# Patient Record
Sex: Male | Born: 1950
Health system: Southern US, Community
[De-identification: ages and names within clinical notes are randomized; demographics above are authoritative.]

## PROBLEM LIST (undated history)

## (undated) DIAGNOSIS — H269 Unspecified cataract: Secondary | ICD-10-CM

## (undated) DIAGNOSIS — F32A Depression, unspecified: Secondary | ICD-10-CM

## (undated) DIAGNOSIS — J449 Chronic obstructive pulmonary disease, unspecified: Secondary | ICD-10-CM

## (undated) DIAGNOSIS — E119 Type 2 diabetes mellitus without complications: Secondary | ICD-10-CM

## (undated) DIAGNOSIS — H35039 Hypertensive retinopathy, unspecified eye: Secondary | ICD-10-CM

## (undated) DIAGNOSIS — I639 Cerebral infarction, unspecified: Secondary | ICD-10-CM

## (undated) DIAGNOSIS — G4730 Sleep apnea, unspecified: Secondary | ICD-10-CM

## (undated) DIAGNOSIS — F329 Major depressive disorder, single episode, unspecified: Secondary | ICD-10-CM

## (undated) DIAGNOSIS — H3320 Serous retinal detachment, unspecified eye: Secondary | ICD-10-CM

## (undated) DIAGNOSIS — F419 Anxiety disorder, unspecified: Secondary | ICD-10-CM

## (undated) DIAGNOSIS — J189 Pneumonia, unspecified organism: Secondary | ICD-10-CM

## (undated) DIAGNOSIS — Z87442 Personal history of urinary calculi: Secondary | ICD-10-CM

## (undated) DIAGNOSIS — E785 Hyperlipidemia, unspecified: Secondary | ICD-10-CM

## (undated) DIAGNOSIS — M1990 Unspecified osteoarthritis, unspecified site: Secondary | ICD-10-CM

## (undated) DIAGNOSIS — I10 Essential (primary) hypertension: Secondary | ICD-10-CM

## (undated) DIAGNOSIS — R197 Diarrhea, unspecified: Secondary | ICD-10-CM

## (undated) HISTORY — PX: CATARACT EXTRACTION: SUR2

## (undated) HISTORY — PX: EYE SURGERY: SHX253

## (undated) HISTORY — PX: OTHER SURGICAL HISTORY: SHX169

## (undated) HISTORY — DX: Hyperlipidemia, unspecified: E78.5

## (undated) HISTORY — DX: Major depressive disorder, single episode, unspecified: F32.9

## (undated) HISTORY — DX: Serous retinal detachment, unspecified eye: H33.20

## (undated) HISTORY — DX: Essential (primary) hypertension: I10

## (undated) HISTORY — DX: Hypertensive retinopathy, unspecified eye: H35.039

## (undated) HISTORY — DX: Chronic obstructive pulmonary disease, unspecified: J44.9

## (undated) HISTORY — DX: Unspecified cataract: H26.9

## (undated) HISTORY — DX: Cerebral infarction, unspecified: I63.9

## (undated) HISTORY — DX: Sleep apnea, unspecified: G47.30

## (undated) HISTORY — DX: Depression, unspecified: F32.A

## (undated) HISTORY — PX: CIRCUMCISION: SUR203

## (undated) HISTORY — DX: Type 2 diabetes mellitus without complications: E11.9

---

## 2000-12-17 ENCOUNTER — Emergency Department (HOSPITAL_COMMUNITY): Admission: EM | Admit: 2000-12-17 | Discharge: 2000-12-17 | Payer: Self-pay

## 2005-04-25 ENCOUNTER — Emergency Department (HOSPITAL_COMMUNITY): Admission: EM | Admit: 2005-04-25 | Discharge: 2005-04-25 | Payer: Self-pay | Admitting: Family Medicine

## 2006-06-15 ENCOUNTER — Emergency Department (HOSPITAL_COMMUNITY): Admission: EM | Admit: 2006-06-15 | Discharge: 2006-06-15 | Payer: Self-pay | Admitting: Family Medicine

## 2010-08-17 ENCOUNTER — Ambulatory Visit (INDEPENDENT_AMBULATORY_CARE_PROVIDER_SITE_OTHER): Payer: Self-pay | Admitting: Family Medicine

## 2010-08-17 ENCOUNTER — Ambulatory Visit (HOSPITAL_COMMUNITY)
Admission: RE | Admit: 2010-08-17 | Discharge: 2010-08-17 | Disposition: A | Discharge status: Home / Self Care | Payer: Self-pay | Source: Ambulatory Visit | Attending: Family Medicine | Admitting: Family Medicine

## 2010-08-17 ENCOUNTER — Encounter: Payer: Self-pay | Admitting: Family Medicine

## 2010-08-17 VITALS — BP 159/96 | HR 49 | Temp 97.7°F | Ht 66.25 in | Wt 188.4 lb

## 2010-08-17 DIAGNOSIS — I10 Essential (primary) hypertension: Secondary | ICD-10-CM | POA: Insufficient documentation

## 2010-08-17 DIAGNOSIS — I498 Other specified cardiac arrhythmias: Secondary | ICD-10-CM | POA: Insufficient documentation

## 2010-08-17 DIAGNOSIS — F17200 Nicotine dependence, unspecified, uncomplicated: Secondary | ICD-10-CM | POA: Insufficient documentation

## 2010-08-17 DIAGNOSIS — Z720 Tobacco use: Secondary | ICD-10-CM

## 2010-08-17 DIAGNOSIS — R001 Bradycardia, unspecified: Secondary | ICD-10-CM

## 2010-08-17 MED ORDER — HYDROCHLOROTHIAZIDE 25 MG PO TABS: 25.0000 mg | ORAL_TABLET | Freq: Every day | ORAL | Status: DC

## 2010-08-17 MED ORDER — TRIAMTERENE-HCTZ 37.5-25 MG PO TABS: 1.0000 | ORAL_TABLET | Freq: Every day | ORAL | Status: DC

## 2010-08-17 NOTE — Progress Notes (Signed)
  Subjective:    Patient ID: Jonathan [name], male    DOB: 1950-07-20, 60 y.o.   MRN: [id number]  HPI Pt her for new visit. Currenty uninsured. In process of getting [facility]. Past history significant for hypertension and smoking.   HTN- Not checking BPs at home. Previously dxd 3-4 years ago per pt. Was on medications previously (unsure of which). No HA, CP, SOB, DOE, orthopnea per pt. Not checking BPs on a regular basis. High intake of canned foods and salted pork. Does not add table salt to foods.   Smoking- 20-30 pack year smoking hx per pt. Currently at 1ppd. Does desire to quit.   Review of Systems See HPI     Objective:   Physical Exam Gen: up in chair, NAD HEENT: NCAT, EOMI, TMs clear bilaterally CV: Bradycardic, no murmurs PULM: CTAB, no wheezes, rales, rhoncii ABD: S/NT/+ bowel sounds  EXT: 2+ peripheral pulses    Assessment & Plan:  HTN- Will start on maxzide as this is K neutral. Will hold on labs until [facility] is set up. HTN handout given to pt. Also discussed low sodium diet. Will follow up in 1-2 weeks pending [facility] set up. Pt agreeable to plan.   Smoking- Pt ready to quit. Will discuss smoking cessation options at follow up visit pending [facility] set up. Pt agreeable to plan.   Bradycardia- Of unclear etiology. Currently not on any medications.  Pt reports remote hx marathon running in the past. EKG w/ sinus bradycardia. Pt currently asymptomatic. Will consider further work up including TSH at follow up.

## 2010-08-17 NOTE — Patient Instructions (Signed)
I am starting you on HCTZ for your blood pressures Please follow up with me in 1-2 weeks once your [facility] is set up  Otherwise, call with any other questions or concerns God Bless, Doree Albee MD  Hypertension (High Blood Pressure) As your heart beats, it forces blood through your arteries. This force is your blood pressure. If the pressure is too high, it is called hypertension (HTN) or high blood pressure. HTN is dangerous because you may have it and not know it. High blood pressure may mean that your heart has to work harder to pump blood. Your arteries may be narrow or stiff. The extra work puts you at risk for heart disease, stroke, and other problems.  Blood pressure consists of two numbers, a higher number over a lower, 110/72, for example. It is stated as "110 over 72." The ideal is below 120 for the top number (systolic) and under 80 for the bottom (diastolic). Write down your blood pressure today. You should pay close attention to your blood pressure if you have certain conditions such as:  Heart failure.  Prior heart attack.   Diabetes   Chronic kidney disease.   Prior stroke.   Multiple risk factors for heart disease.   To see if you have HTN, your blood pressure should be measured while you are seated with your arm held at the level of the heart. It should be measured at least twice. A one-time elevated blood pressure reading (especially in the Emergency Department) does not mean that you need treatment. There may be conditions in which the blood pressure is different between your right and left arms. It is important to see your caregiver soon for a recheck. Most people have essential hypertension which means that there is not a specific cause. This type of high blood pressure may be lowered by changing lifestyle factors such as:  Stress.  Smoking.   Lack of exercise.   Excessive weight.  Drug/tobacco/alcohol use.   Eating less salt.   Most people do not have  symptoms from high blood pressure until it has caused damage to the body. Effective treatment can often prevent, delay or reduce that damage. TREATMENT Treatment for high blood pressure, when a cause has been identified, is directed at the cause. There are a large number of medications to treat HTN. These fall into several categories, and your caregiver will help you select the medicines that are best for you. Medications may have side effects. You should review side effects with your caregiver. If your blood pressure stays high after you have made lifestyle changes or started on medicines,   Your medication(s) may need to be changed.   Other problems may need to be addressed.   Be certain you understand your prescriptions, and know how and when to take your medicine.   Be sure to follow up with your caregiver within the time frame advised (usually within two weeks) to have your blood pressure rechecked and to review your medications.   If you are taking more than one medicine to lower your blood pressure, make sure you know how and at what times they should be taken. Taking two medicines at the same time can result in blood pressure that is too low.  SEEK IMMEDIATE MEDICAL CARE IF YOU DEVELOP:  A severe headache, blurred or changing vision, or confusion.   Unusual weakness or numbness, or a faint feeling.   Severe chest or abdominal pain, vomiting, or breathing problems.  MAKE  SURE YOU:   Understand these instructions.   Will watch your condition.   Will get help right away if you are not doing well or get worse.  Document Released: 04/11/2005 Document Re-Released: 09/29/2009 [facility] Patient Information 2011 [place], [place].

## 2010-08-17 NOTE — Assessment & Plan Note (Signed)
Of unclear etiology. Currently not on any medications. Pt reports remote hx marathon running in the past. EKG w/ sinus bradycardia. Pt currently asymptomatic. Will consider further work up including TSH at follow up.

## 2010-08-17 NOTE — Assessment & Plan Note (Signed)
Will start on maxzide as this is K neutral. Will hold on labs until [facility] is set up. HTN handout given to pt. Also discussed low sodium diet. Will follow up in 1-2 weeks pending [facility] set up. Pt agreeable to plan

## 2010-08-17 NOTE — Assessment & Plan Note (Signed)
Pt ready to quit. Will discuss smoking cessation options at follow up visit pending [facility] set up. Pt agreeable to plan.

## 2010-09-08 ENCOUNTER — Ambulatory Visit: Payer: Self-pay | Admitting: Family Medicine

## 2010-10-04 ENCOUNTER — Ambulatory Visit: Payer: Self-pay | Admitting: Family Medicine

## 2010-10-06 ENCOUNTER — Telehealth: Payer: Self-pay | Admitting: Family Medicine

## 2010-10-06 NOTE — Telephone Encounter (Signed)
Has moved appointment to sooner because his BP was high, they will be coming in on Friday but until then they want to know what to do to get his BP down.

## 2010-10-07 ENCOUNTER — Ambulatory Visit (INDEPENDENT_AMBULATORY_CARE_PROVIDER_SITE_OTHER): Payer: Self-pay | Admitting: *Deleted

## 2010-10-07 ENCOUNTER — Telehealth: Payer: Self-pay | Admitting: Family Medicine

## 2010-10-07 VITALS — BP 180/100 | HR 64

## 2010-10-07 DIAGNOSIS — I10 Essential (primary) hypertension: Secondary | ICD-10-CM

## 2010-10-07 MED ORDER — DOXAZOSIN MESYLATE 2 MG PO TABS: 2.0000 mg | ORAL_TABLET | Freq: Every day | ORAL | Status: DC

## 2010-10-07 NOTE — Telephone Encounter (Signed)
Pt came in for nurse visit today and BP was still very high.  I added Cardura 2 mg qhs to his maxzide daily.  Will still need to come in for office visit and labs tomorrow.

## 2010-10-07 NOTE — Progress Notes (Signed)
In for BP check. States he checked BP at [facility] yesterday with reading of 180/143.  BP today in office checked manually using regular adult cuff. BP   RA 160/96 and LA 180/100 pulse 64. Denies any chest pain , SOB ,  vision changes.  Consulted with Dr. Cathey Endow preceptor and she will send in RX for Cardura. Continue Maxizide. Has appointment tomorrow AM with Luretha Murphy.

## 2010-10-07 NOTE — Telephone Encounter (Signed)
Called pt. Spoke with pt's wife Aggie Cosier. She reports, that pt's BP was taken at [facility] yesterday.  BP 180/143. Pt takes his HTN meds daily. He has upcoming appt with S.Saxon tomorrow at 8:45am. Pt is doing fine per wife. No symptoms of dizziness, blurred vision, pain... Advised to schedule nurse visit today to have BP checked and also to keep appt tomorrow. Aggie Cosier  Agreed. Lorenda Hatchet, Renato Battles

## 2010-10-08 ENCOUNTER — Ambulatory Visit (INDEPENDENT_AMBULATORY_CARE_PROVIDER_SITE_OTHER): Payer: Self-pay | Admitting: Family Medicine

## 2010-10-08 ENCOUNTER — Encounter: Payer: Self-pay | Admitting: Family Medicine

## 2010-10-08 DIAGNOSIS — Z720 Tobacco use: Secondary | ICD-10-CM

## 2010-10-08 DIAGNOSIS — I10 Essential (primary) hypertension: Secondary | ICD-10-CM

## 2010-10-08 DIAGNOSIS — F17200 Nicotine dependence, unspecified, uncomplicated: Secondary | ICD-10-CM

## 2010-10-08 MED ORDER — LISINOPRIL-HYDROCHLOROTHIAZIDE 10-12.5 MG PO TABS: 1.0000 | ORAL_TABLET | Freq: Every day | ORAL | Status: DC

## 2010-10-08 NOTE — Assessment & Plan Note (Signed)
2. Tobacco abuse: current smoker, 1ppd, interested in quitting.  A. Instructed pt to decrease down to 1/2ppd, and maintain this for about 1 month.  At that time, choose a quit date, then notify provider.  Once this is accomplished, we will coordinate pharmacist to assist with smoking cessation.  Plans agreed upon by patient, pt's wife, and medical team.

## 2010-10-08 NOTE — Patient Instructions (Signed)
Finish your triamterene/HCTZ and take doxasosin this should be 7-10 Then begin the new medication lisinopril/HCTZ then You must come to see Dr. Alvester Morin in 2 weeks after starting lisinopril for blood work (do not eat).   Decrease smoking to 1/2 pack per day for about 1 month.  Then, set a quit date, and let us know what that day is.  Then our pharmacist will help you establish a plan to quit.

## 2010-10-08 NOTE — Progress Notes (Signed)
  Subjective:    Patient ID: Jonathan [name], male    DOB: 1950/07/01, 60 y.o.   MRN: [id number]  HPI 60 year old man returns to clinic for follow-up of HTN after being started on doxazosin on 10/07/10.  Before starting doxazosin, his HTN was being treated with triamterene-HCTZ 37.5-25mg  po daily.  Today, after one dose of doxazosin, pt presents with BP 132/82.  Denies headache, cp/pressure/heaviness, palpitations, dyspnea at rest or with exertion (good exercise tolerance), diaphoresis, n/v, extremity swelling, claudication, headaches, vision changes/diplopia/blurry vision, facial flushing, difficulty urinating.   Psych/Soc: Pt is a current smoker, 1ppd, 45 pack years.  Interested in smoking cessation.  Wife has quit smoking (with chantix).  He also has a previous exposure to asbestos X1 week (did not get CXR recommended by employer). He exercises regularly.  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  Denies ilicit/street drug use, or herbals.    FH: Father-MI ( at 62yrs old), died 2/2 cirrhosis; Mother- died 2/2 brain tumor (103yrs); older brother died at 33 from a massive MI; sister- HTN; other brother- unknown medical history, but alive; MGM-asthma; son-asthma; oldest son-emphysema 2/2 smoking cigarettes.      Review of Systems  Constitutional: Negative for diaphoresis, activity change, fatigue and unexpected weight change.  HENT: Negative for nosebleeds, congestion, trouble swallowing and voice change.   Eyes: Negative for visual disturbance.  Respiratory: Positive for cough. Negative for apnea, chest tightness, shortness of breath and wheezing.        Occasional cough, productive of white/clear sputum  Cardiovascular: Negative for chest pain, palpitations and leg swelling.  Gastrointestinal: Negative for nausea, vomiting, abdominal pain, diarrhea, constipation, blood in stool and anal bleeding.  Genitourinary: Negative for decreased urine volume and difficulty urinating.    Musculoskeletal: Negative for myalgias.  Skin: Positive for wound. Negative for color change.       Left forearm significant for healing burn  Neurological: Negative for dizziness, syncope, facial asymmetry, speech difficulty, weakness, light-headedness, numbness and headaches.  Hematological: Negative for adenopathy.  Psychiatric/Behavioral: Negative for confusion.       Objective:   Physical Exam  Constitutional: He is oriented to person, place, and time. He appears well-developed and well-nourished. No distress.  HENT:  Head: Normocephalic and atraumatic.  Nose: Nose normal.  Mouth/Throat: Oropharynx is clear and moist. No oropharyngeal exudate.  Eyes: Conjunctivae and EOM are normal. Pupils are equal, round, and reactive to light. Right eye exhibits no discharge. Left eye exhibits no discharge. No scleral icterus.  Neck: Neck supple. No JVD present. No thyromegaly present.  Cardiovascular: Normal rate, regular rhythm, normal heart sounds and intact distal pulses.  Exam reveals no gallop and no friction rub.   No murmur heard. Pulmonary/Chest: Effort normal and breath sounds normal. He exhibits no tenderness.  Abdominal: Soft. Bowel sounds are normal. He exhibits no mass. There is no tenderness.  Lymphadenopathy:    He has no cervical adenopathy.  Neurological: He is alert and oriented to person, place, and time.  Skin: Skin is warm and dry. He is not diaphoretic.       Left forearm with healing wound 2/2 burn.  Pt denies discharge.  Psychiatric: He has a normal mood and affect. His behavior is normal.          Assessment & Plan:

## 2010-10-08 NOTE — Assessment & Plan Note (Addendum)
1. Hypertension: BP improved today (132/82).  Re-addressing medication regimen.  A. Prescribed lisinopril-HCTZ 10mg -12.5mg  1 tablet po daily Disp #30, 11 refills.   B. Pt was instructed to continue taking both the doxazosin and triamterene-HCTZ until he finishes his current prescription of triamterene-HCTZ.  Once he has no more of the triamterene-HCTZ, he was instructed to stop taking the doxazosin, and begin taking the new medication: lisinopril-HCTZ 10mg -12.5mg  1 tablet po daily.  C. Instructed pt to make follow-up appt to see Dr. Alvester Morin (PCP) in two weeks.   2. Tobacco abuse: current smoker, 1ppd, interested in quitting.  A. Instructed pt to decrease down to 1/2ppd, and maintain this for about 1 month.  At that time, choose a quit date, then notify provider.  Once this is accomplished, we will coordinate pharmacist to assist with smoking cessation.  Plans agreed upon by patient, pt's wife, and medical team.

## 2010-10-29 ENCOUNTER — Ambulatory Visit: Payer: Self-pay | Admitting: Family Medicine

## 2010-11-03 ENCOUNTER — Encounter: Payer: Self-pay | Admitting: Family Medicine

## 2010-11-03 ENCOUNTER — Ambulatory Visit (INDEPENDENT_AMBULATORY_CARE_PROVIDER_SITE_OTHER): Payer: Self-pay | Admitting: Family Medicine

## 2010-11-03 DIAGNOSIS — F17200 Nicotine dependence, unspecified, uncomplicated: Secondary | ICD-10-CM

## 2010-11-03 DIAGNOSIS — I10 Essential (primary) hypertension: Secondary | ICD-10-CM

## 2010-11-03 DIAGNOSIS — K4090 Unilateral inguinal hernia, without obstruction or gangrene, not specified as recurrent: Secondary | ICD-10-CM

## 2010-11-03 DIAGNOSIS — Z23 Encounter for immunization: Secondary | ICD-10-CM

## 2010-11-03 LAB — COMPREHENSIVE METABOLIC PANEL
ALT: 35 U/L (ref 0–53)
Albumin: 4.5 g/dL (ref 3.5–5.2)
BUN: 19 mg/dL (ref 6–23)
CO2: 22 mEq/L (ref 19–32)
Calcium: 9.4 mg/dL (ref 8.4–10.5)
Chloride: 105 mEq/L (ref 96–112)
Creat: 1.17 mg/dL (ref 0.50–1.35)
Potassium: 3.9 mEq/L (ref 3.5–5.3)

## 2010-11-03 MED ORDER — VARENICLINE TARTRATE 0.5 MG PO TABS: 0.5000 mg | ORAL_TABLET | Freq: Two times a day (BID) | ORAL | Status: AC

## 2010-11-03 MED ORDER — LISINOPRIL-HYDROCHLOROTHIAZIDE 10-12.5 MG PO TABS: 2.0000 | ORAL_TABLET | Freq: Every day | ORAL | Status: DC

## 2010-11-03 NOTE — Progress Notes (Signed)
  Subjective:    Patient ID: Jonathan [name], male    DOB: 1950/12/30, 60 y.o.   MRN: [id number]  HPI HTN- Checks BP ar [facility]. Was 180/143. Currently taking zestoretic. Walks in neighborhood intermittently. No HA, CP, SOB per pt. Is trying to cut down on sodium intake.  Hernia: Present for approx 1 year. R sided. In groin area. Sometimes painful. Does not affect bowel mov'ts. Has been progressively getting bigger. Non tender. Is desiring possible surgical referral for this.   Review of Systems See HPI     Objective:   Physical Exam Gen: up in chair, NAD HEENT: NCAT, EOMI, TMs clear bilaterally CV: RRR, no murmurs auscultated PULM: CTAB, no wheezes, rales, rhoncii ABD: S/NT/+ bowel sounds  Groin: Palpable R inguinal mass with cough/bearing down. EXT: 2+ peripheral pulses Assessment & Plan:  HTN: Will increase lisinopril/HCtZ combo today.  Hernia: will refer to surgery for further management.

## 2010-11-03 NOTE — Patient Instructions (Addendum)
It was good to see you today I am increasing your blood pressure medicine I am getting some blood work.  If you have any chest pain, shortness of breath, or any other concerning symptoms, please give [place] a call.  I am also starting you on chantix for your smoking If you notice any suicidal thoughts, please give [place] a call God Bless, Doree Albee MD

## 2010-11-07 DIAGNOSIS — K4090 Unilateral inguinal hernia, without obstruction or gangrene, not specified as recurrent: Secondary | ICD-10-CM | POA: Insufficient documentation

## 2010-11-07 NOTE — Assessment & Plan Note (Addendum)
Will increase lisinopril/HCTZ combo. Will also check Cr and K.

## 2010-11-07 NOTE — Assessment & Plan Note (Signed)
Will refer to surgery for further management

## 2010-11-18 ENCOUNTER — Encounter: Payer: Self-pay | Admitting: Family Medicine

## 2010-11-18 ENCOUNTER — Ambulatory Visit (INDEPENDENT_AMBULATORY_CARE_PROVIDER_SITE_OTHER): Payer: Self-pay | Admitting: Family Medicine

## 2010-11-18 ENCOUNTER — Ambulatory Visit (HOSPITAL_COMMUNITY)
Admission: RE | Admit: 2010-11-18 | Discharge: 2010-11-18 | Disposition: A | Discharge status: Home / Self Care | Payer: Self-pay | Source: Ambulatory Visit | Attending: Family Medicine | Admitting: Family Medicine

## 2010-11-18 VITALS — BP 133/84 | HR 73 | Temp 98.0°F | Ht 66.0 in | Wt 189.0 lb

## 2010-11-18 DIAGNOSIS — M25561 Pain in right knee: Secondary | ICD-10-CM

## 2010-11-18 DIAGNOSIS — F17200 Nicotine dependence, unspecified, uncomplicated: Secondary | ICD-10-CM | POA: Insufficient documentation

## 2010-11-18 DIAGNOSIS — R059 Cough, unspecified: Secondary | ICD-10-CM | POA: Insufficient documentation

## 2010-11-18 DIAGNOSIS — M25562 Pain in left knee: Secondary | ICD-10-CM

## 2010-11-18 DIAGNOSIS — Z77090 Contact with and (suspected) exposure to asbestos: Secondary | ICD-10-CM

## 2010-11-18 DIAGNOSIS — I10 Essential (primary) hypertension: Secondary | ICD-10-CM

## 2010-11-18 DIAGNOSIS — R05 Cough: Secondary | ICD-10-CM | POA: Insufficient documentation

## 2010-11-18 DIAGNOSIS — M25569 Pain in unspecified knee: Secondary | ICD-10-CM | POA: Insufficient documentation

## 2010-11-18 DIAGNOSIS — K4090 Unilateral inguinal hernia, without obstruction or gangrene, not specified as recurrent: Secondary | ICD-10-CM

## 2010-11-18 MED ORDER — AMLODIPINE BESYLATE 5 MG PO TABS: 5.0000 mg | ORAL_TABLET | Freq: Every day | ORAL | Status: DC

## 2010-11-18 NOTE — Assessment & Plan Note (Signed)
Likely progression of baseline arthritic disease. No focal findings noted on MSK exam Will send for xrays to assess for any articular damage. Instructed pt to use tylenol for pain prn.

## 2010-11-18 NOTE — Progress Notes (Signed)
  Subjective:    Patient ID: ELEFTHERIOS DUDENHOEFFER, male    DOB: Jul 20, 1950, 60 y.o.   MRN: [id number]  HPI HTN: 130s/80s at home. No HA, CP, SOB. Still walking. Cutting back on soda. Drinking plenty of water. Cutting back on quite a few salty items.    Knee Pain: pt reports longstanding hx/o knee pain. 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 All of these activities required vigorous activities on the knees per pt. Pt states over the past 2-3 months, he has noticed worsening knee pain (R>L) that is worse with kneeling and bending of R leg. Pt states that we will lay on bed/couch with knee in flexed position and note sever stiffness and popping in moving from this position. No erythema, radicular pain, swelling per pt. Pain usually resolves after 20 -30 minutes of ambulation. Pt denies any recent direct trauma to the knee. Review of Systems See HPI, otherwise 12 point ROS negative.     Objective:   Physical Exam Gen: up in chair, NAD CV: RRR, no murmurs auscultated PULM: CTAB, no wheezes, rales, rhoncii ABD: S/NT/+ bowel sounds  Knee: Knee exam shows no effusion; stable ligaments; negative Mcmurray's and provocative meniscal tests; crepitation on patellar compression; patellar and quadriceps tendons unremarkable. EXT: 2+ peripheral pulses Assessment & Plan:

## 2010-11-18 NOTE — Assessment & Plan Note (Signed)
Pending surgical follow up.

## 2010-11-18 NOTE — Patient Instructions (Signed)
It was good to see you again today Your blood pressure is doing better.  I am starting you on norvasc to help with your blood pressures Continue exercising and eating a low salt diet.  For your knee pain, try using tylenol as needed.  Come back to see me in 2-3 months for follow up on your knee pain.  Call with any questions, God Bless,  Doree Albee MD

## 2010-11-18 NOTE — Assessment & Plan Note (Signed)
Will check follow up CXR.

## 2010-11-18 NOTE — Assessment & Plan Note (Signed)
Overall stable on current regimen. Will add on norvasc to help keep BP in target range. Will follow up in 2 months for follow up. Will check Cr and K at that time.

## 2010-11-23 ENCOUNTER — Encounter: Payer: Self-pay | Admitting: Family Medicine

## 2010-11-29 ENCOUNTER — Encounter: Payer: Self-pay | Admitting: Family Medicine

## 2010-12-07 ENCOUNTER — Ambulatory Visit (INDEPENDENT_AMBULATORY_CARE_PROVIDER_SITE_OTHER): Payer: Self-pay | Admitting: Surgery

## 2011-01-13 ENCOUNTER — Ambulatory Visit: Payer: Self-pay | Admitting: Family Medicine

## 2011-01-17 ENCOUNTER — Ambulatory Visit (INDEPENDENT_AMBULATORY_CARE_PROVIDER_SITE_OTHER): Payer: Self-pay | Admitting: Family Medicine

## 2011-01-17 ENCOUNTER — Encounter: Payer: Self-pay | Admitting: Family Medicine

## 2011-01-17 VITALS — BP 154/82 | HR 50 | Wt 193.3 lb

## 2011-01-17 DIAGNOSIS — H9190 Unspecified hearing loss, unspecified ear: Secondary | ICD-10-CM | POA: Insufficient documentation

## 2011-01-17 DIAGNOSIS — Z720 Tobacco use: Secondary | ICD-10-CM

## 2011-01-17 DIAGNOSIS — Z23 Encounter for immunization: Secondary | ICD-10-CM

## 2011-01-17 DIAGNOSIS — F17200 Nicotine dependence, unspecified, uncomplicated: Secondary | ICD-10-CM

## 2011-01-17 DIAGNOSIS — I10 Essential (primary) hypertension: Secondary | ICD-10-CM

## 2011-01-17 LAB — BASIC METABOLIC PANEL
CO2: 26 mEq/L (ref 19–32)
Chloride: 102 mEq/L (ref 96–112)
Creat: 1.12 mg/dL (ref 0.50–1.35)
Potassium: 4.8 mEq/L (ref 3.5–5.3)
Sodium: 138 mEq/L (ref 135–145)

## 2011-01-17 NOTE — Assessment & Plan Note (Signed)
Overall mixed picture on exam today. Hearing loss maybe combination of both sensorineural and conductive hearing loss. Refer to ENT for further assessment.

## 2011-01-17 NOTE — Assessment & Plan Note (Addendum)
Patient instructed to resume previous regimen of Zestoretic 20/25. Will continue with Norvasc 5. We'll check creatinine and potassium today. Discussed importance of low sodium intake. We'll follow up in one to 2 months.

## 2011-01-17 NOTE — Patient Instructions (Signed)
It was good to see today.  I am referring you to the ear nose and throat doctor for your hearing loss Be sure to take 2 tablets of you combine lisinopril and HCTZ tablet Cut down on your salty food intake Come back to see me in one to 2 months for followup on your blood pressure Call with any questions God Bless,  Doree Albee MD

## 2011-01-17 NOTE — Assessment & Plan Note (Signed)
Encouraged decreased smoking today. Patient feels he is making significant progress. Encouraged compliance with Chantix. Will followup in one month.

## 2011-01-17 NOTE — Progress Notes (Signed)
  Subjective:    Patient ID: Jonathan [name], male    DOB: 01-Aug-1950, 60 y.o.   MRN: [id number]  HPI Patient is here for current problem followup: Hypertension: The patient has been checking blood pressures on an intermittent basis. The patient usually checks his blood pressures at either the grocery store or the pharmacy. Per patient pressures have been ranging in the 130s to 160s at home. No headache chest pain or shortness of breath per patient. And most recent visit patient started on Norvasc. The patient was previously on Zestoretic 20/25. However further discussion with the patient, he was only taking Zestoretic 10/12.5. Patient also reports a relatively high salt intake including daily processed meats, including salted pork. No orthopnea or lower extremity swelling. Hearing loss: The patient was a long-standing history of hearing loss there is seemed to be worsened over the last 2-3 months. Patient reports that hes  had a history of hearing loss since his days in the [employer]. Patient also reports a baseline history of perforated eardrums bilaterally that he was seen for this approximately 4-5 years ago. Patient denies any recent trauma. However patient does report active work in [employer]. Hearing loss seems to be most prominent  on the left versus right. No vertiginous symptoms or gait instability. No recent infections. Tobacco Abuse: Patient with a long-standing history of tobacco abuse. Patient previously a greater than one pack per day smoker. Per patient he is now down to smoking three quarters of a pack daily. Has been using Chantix intermittently.   Review of Systems See HPI     Objective:   Physical Exam Gen: up in chair, NAD HEENT: NCAT, EOMI, TMs clear bilaterally, no noted eardrum perforations.  CV: RRR, no murmurs auscultated PULM: CTAB, no wheezes, rales, rhoncii ABD: S/NT/+ bowel sounds  EXT: 2+ peripheral pulses Neuro: Rinne/Weber: + lateralization oh conductive hearing with  Weber to L side; AC>BC bilaterally  Assessment & Plan:

## 2011-01-24 ENCOUNTER — Telehealth: Payer: Self-pay | Admitting: *Deleted

## 2011-01-24 NOTE — Telephone Encounter (Signed)
appt (through [employer]) Audiologist 02-08-11 arrive at 8:45 am. Ph [phone]. [address] Pt informed of appt .Arlyss Repress

## 2011-02-01 ENCOUNTER — Telehealth: Payer: Self-pay | Admitting: Family Medicine

## 2011-02-01 ENCOUNTER — Other Ambulatory Visit: Payer: Self-pay | Admitting: Family Medicine

## 2011-02-01 DIAGNOSIS — I10 Essential (primary) hypertension: Secondary | ICD-10-CM

## 2011-02-01 MED ORDER — AMLODIPINE BESYLATE 5 MG PO TABS: 10.0000 mg | ORAL_TABLET | Freq: Every day | ORAL | Status: DC

## 2011-02-01 NOTE — Telephone Encounter (Signed)
Will fwd. To Dr.Newton for advise. .Jonathan [name]  

## 2011-02-01 NOTE — Telephone Encounter (Signed)
Pt has been taking 2 Amlodipine (he says that's what Dr Alvester Morin told him to do) and he is out early.  Wants to know what he should do.

## 2011-02-03 NOTE — Telephone Encounter (Signed)
Okey Regal Scientist, research (physical sciences)) at [facility] [employer] [phone] calling to verify Mr. Schoenfeld Rx for generic Norvasc 5mg .  Prescription written to take two tablets daily but was only written for #30.  Advised pharmacy to increase to #60.  Ileana Ladd

## 2011-02-04 ENCOUNTER — Ambulatory Visit (INDEPENDENT_AMBULATORY_CARE_PROVIDER_SITE_OTHER): Payer: Self-pay | Admitting: Family Medicine

## 2011-02-04 ENCOUNTER — Encounter: Payer: Self-pay | Admitting: Family Medicine

## 2011-02-04 VITALS — BP 145/82 | HR 79 | Temp 98.3°F | Ht 66.0 in | Wt 196.3 lb

## 2011-02-04 DIAGNOSIS — I10 Essential (primary) hypertension: Secondary | ICD-10-CM

## 2011-02-04 MED ORDER — LISINOPRIL-HYDROCHLOROTHIAZIDE 20-12.5 MG PO TABS: 2.0000 | ORAL_TABLET | Freq: Every day | ORAL | Status: DC

## 2011-02-04 MED ORDER — ASPIRIN EC 81 MG PO TBEC: 81.0000 mg | DELAYED_RELEASE_TABLET | Freq: Every day | ORAL | Status: DC

## 2011-02-04 NOTE — Assessment & Plan Note (Addendum)
Will double ACE inhibitor component of Zestoretic. She has a followup appointment with me in approximately 2 weeks. Will follow up  blood pressure at that point. No indication for labs right now.

## 2011-02-04 NOTE — Progress Notes (Signed)
  Subjective:    Patient ID: Jonathan [name], male    DOB: 11-28-1950, 60 y.o.   MRN: [id number]  HPI Patient being seen today for work in visit for hypertension. Patient states he's been checking his blood pressures at home and they have consistently been in the 160s/90s. No chest pain, shortness of breath, or  headache. Patient is compliant with his medications including Norvasc 10 mg as well as Zestoretic 10/12.5 two tabs daily. Patient states he decrease his salt intake since last clinical visit. Patient still smoking.    Review of Systems See HPI     Objective:   Physical Exam Gen: up in chair, NAD HEENT: NCAT, EOMI, TMs clear bilaterally CV: RRR, no murmurs auscultated PULM: CTAB, no wheezes, rales, rhoncii ABD: S/NT/+ bowel sounds  EXT: 2+ peripheral pulses   Assessment & Plan:

## 2011-02-04 NOTE — Patient Instructions (Signed)

## 2011-02-08 ENCOUNTER — Telehealth: Payer: Self-pay | Admitting: Family Medicine

## 2011-02-08 NOTE — Telephone Encounter (Signed)
States that they cannot afford the Amlodipine and needs something cheaper [facility] [address]

## 2011-02-08 NOTE — Telephone Encounter (Signed)
Called pt. Pt was not available. Will fwd. Request to Dr.Newton. Lorenda Hatchet, Renato Battles

## 2011-02-08 NOTE — Telephone Encounter (Signed)
Informed pt's wife that request has been sent to Dr. Alvester Morin.Loralee Pacas [place]

## 2011-02-14 ENCOUNTER — Ambulatory Visit: Payer: Self-pay | Admitting: Family Medicine

## 2011-02-16 ENCOUNTER — Telehealth: Payer: Self-pay | Admitting: Family Medicine

## 2011-02-16 NOTE — Telephone Encounter (Signed)
Send the Rx for Amlodipine to [facility] on [address].  She will pick it up around the 1st of the month when she gets paid.

## 2011-02-16 NOTE — Telephone Encounter (Signed)
To PCP for refill .Arlyss Repress

## 2011-02-16 NOTE — Telephone Encounter (Signed)
Called and left VM for pt that he can fill medication at [facility] for 10 dollars per month.

## 2011-02-18 NOTE — Telephone Encounter (Signed)
Please inform pt that he can have refill sent over to [facility] by requesting refill transfer.

## 2011-03-02 ENCOUNTER — Ambulatory Visit (INDEPENDENT_AMBULATORY_CARE_PROVIDER_SITE_OTHER): Payer: Self-pay | Admitting: Family Medicine

## 2011-03-02 ENCOUNTER — Encounter: Payer: Self-pay | Admitting: Family Medicine

## 2011-03-02 VITALS — BP 165/80 | HR 61 | Ht 66.0 in | Wt 198.0 lb

## 2011-03-02 DIAGNOSIS — M25511 Pain in right shoulder: Secondary | ICD-10-CM | POA: Insufficient documentation

## 2011-03-02 DIAGNOSIS — I10 Essential (primary) hypertension: Secondary | ICD-10-CM

## 2011-03-02 DIAGNOSIS — M25519 Pain in unspecified shoulder: Secondary | ICD-10-CM

## 2011-03-02 MED ORDER — AMLODIPINE BESYLATE 10 MG PO TABS: 10.0000 mg | ORAL_TABLET | Freq: Every day | ORAL | Status: DC

## 2011-03-02 NOTE — Progress Notes (Signed)
  Subjective:    Patient ID: Jonathan [name], male    DOB: May 04, 1950, 60 y.o.   MRN: [id number]  HPI Shoulder pain: Patient reports that he fell off of a dumpster approximately 2 weeks ago. Patient states he was taking out the trash and fell upon retrieval of the trash can. No direct trauma to shoulder. Pt states that he fell on back more than shoulder. Patient denies any shoulder instability since the fall. No secondary ecchymoses around the area. Shoulder area has been painful. Pain is worse with movement of the arm especially with abduction. No radicular symptoms numbness or sensory deficits. Patient has not been using any medicine for pain.  Hypertension: Norvasc was recently added to blood pressure regimen with pt . Patient states he has not received his medications secondary to cost. Patient was unaware the to obtain this prescription at [facility] on the discount plan. Patient states it hasn't taken this medication tomorrow. Patient is up to check his blood pressures on a regular basis. Patient denies any headache chest pain or shortness of breath. Patient states he has tried to decrease his salt intake since last clinical visit.   Review of Systems See history of present illness, otherwise 12 point review of systems negative    Objective:   Physical Exam Gen: in bed, NAD CV: RRR,  No murmurs PULM: CTAB, no wheezes, rales, rhoncii  MSK: R Shoulder: Inspection reveals no abnormalities, atrophy or asymmetry. Palpation is normal with no tenderness over AC joint or bicipital groove. ROM is decreased with R arm abduction Rotator cuff strength is mildly diminished. . No signs of impingement with negative Neer and Hawkin's tests, empty can. No labral pathology noted, overall good stability. Normal scapular function observed. No painful arc and no drop arm sign. No apprehension sign Grip strength 5/5 bilaterally    Assessment & Plan:

## 2011-03-02 NOTE — Assessment & Plan Note (Signed)
Relatively broad differential for this including rotator cuff pathology vs. Subacromial bursitis vs. Flare of OA. Joint is overall stable on exam which is reassuring. Will give therapuetic shoulder injection. Pt also instructed to take scheduled tylenol. Will follow clinically. If pain persists, will obtain xray to further evaluate.   R shoulder injection: Consent obtained and verified. Sterile betadine prep. Furthur cleansed with alcohol. Topical analgesic spray: Ethyl chloride. Joint: R shoulder  Approached in typical fashion with: 1 cc kenalog 40 and 4 ccs lidocaine 1/% with posterior approach Completed without difficulty Meds: 1 cc kenalog 40 and 4 ccs lidocaine 1/%  Needle: 23 gauge 1 1/2 Aftercare instructions and Red flags advised.

## 2011-03-02 NOTE — Assessment & Plan Note (Signed)
Elevated today. Will not make any medication changes until I see what pt's BP is on full dose norvasc. Pt instructed to f/u in 1 week for VS check. Will make determination then as to how to further manage BP. Pt instructed importance of filling rx. Pt agreeable. Will formally reassess at clinical visit in 6 weeks.

## 2011-03-02 NOTE — Patient Instructions (Addendum)
It was good to see you again Congratulations on cutting back on your smoking Be sure to pick up your Norvasc prescription You can use scheduled Tylenol for your shoulder pain Come back next week for a vital sign check Otherwise come back to see me in 6 weeks Call if any questions God Bless, Doree Albee MD    Shoulder Pain The shoulder is a ball and socket joint. The muscles and tendons (rotator cuff) are what keep the shoulder in its joint and stable. This collection of muscles and tendons holds in the head (ball) of the humerus (upper arm bone) in the fossa (cup) of the scapula (shoulder blade). Today no reason was found for your shoulder pain. Often pain in the shoulder may be treated conservatively with temporary immobilization. For example, holding the shoulder in one place using a sling for rest. Physical therapy may be needed if problems continue. HOME CARE INSTRUCTIONS   Apply ice to the sore area for 15 to 20 minutes, 3 to 4 times per day for the first 2 days. Put the ice in a plastic bag. Place a towel between the bag of ice and your skin.   If you have or were given a shoulder sling and straps, do not remove for as long as directed by your caregiver or until you see a caregiver for a follow-up examination. If you need to remove it to shower or bathe, move your arm as little as possible.   Sleep on several pillows at night to lessen swelling and pain.   Only take over-the-counter or prescription medicines for pain, discomfort, or fever as directed by your caregiver.   Keep any follow-up appointments in order to avoid any type of permanent shoulder disability or chronic pain problems.  SEEK MEDICAL CARE IF:   Pain in your shoulder increases or new pain develops in your arm, hand, or fingers.   Your hand or fingers are colder than your other hand.   You do not obtain pain relief with the medications or your pain becomes worse.  SEEK IMMEDIATE MEDICAL CARE IF:   Your arm,  hand, or fingers are numb or tingling.   Your arm, hand, or fingers are swollen, painful, or turn white or blue.   You develop chest pain or shortness of breath.  MAKE SURE YOU:   Understand these instructions.   Will watch your condition.   Will get help right away if you are not doing well or get worse.  Document Released: 01/19/2005 Document Revised: 12/22/2010 Document Reviewed: 08/24/2006 [facility] Patient Information 2012 [place], [place].

## 2011-03-08 ENCOUNTER — Ambulatory Visit (INDEPENDENT_AMBULATORY_CARE_PROVIDER_SITE_OTHER): Payer: Self-pay | Admitting: Family Medicine

## 2011-03-08 ENCOUNTER — Encounter: Payer: Self-pay | Admitting: Family Medicine

## 2011-03-08 DIAGNOSIS — M25519 Pain in unspecified shoulder: Secondary | ICD-10-CM

## 2011-03-08 DIAGNOSIS — M25511 Pain in right shoulder: Secondary | ICD-10-CM

## 2011-03-08 DIAGNOSIS — F17200 Nicotine dependence, unspecified, uncomplicated: Secondary | ICD-10-CM

## 2011-03-08 DIAGNOSIS — I10 Essential (primary) hypertension: Secondary | ICD-10-CM

## 2011-03-08 DIAGNOSIS — Z720 Tobacco use: Secondary | ICD-10-CM

## 2011-03-08 NOTE — Patient Instructions (Signed)
He was good to see today I am glad that your shoulder is doing much better Your blood pressures are doing much better as well Continue take her medications as prescribed Congratulations on cutting down on smoking Come back to see me in 3 months Call with any questions, God Bless, Doree Albee MD

## 2011-03-08 NOTE — Assessment & Plan Note (Signed)
Much improved s/p shoulder injection. ROM and strength improved. Will continue to follow clinically.

## 2011-03-08 NOTE — Assessment & Plan Note (Signed)
Encouraged decreased use. Offered nicotine gum. Pt declined. Encouraged cessation.

## 2011-03-08 NOTE — Assessment & Plan Note (Signed)
Improved with norvasc. Encouraged daily BP checks and low sodium diet.

## 2011-03-08 NOTE — Progress Notes (Signed)
  Subjective:    Patient ID: Jonathan [name], male    DOB: 1950-06-18, 60 y.o.   MRN: [id number]  HPI Patient is here for general followup visit. Patient was recently seen for right shoulder pain as well as hypertension. A shoulder injection was done at the time. Patient is also noted to have elevated blood pressures in the setting of patient not taking previously prescribed Norvasc. Today, patient states her right shoulder pain is much improved status post shoulder injection overall range of motion flexibility and strength is much improved. Patient is able to move boxes and was previously difficult objects.   from a hypertension standpoint, patient has been taking his Norvasc as prescribed. Patient is not checking his pressures at home. Patient denies any chest pain, shortness of breath, headache. No lotion and cramping or peripheral edema.   patient also states that he has cut down to less than 3 cigarettes per day.  Review of Systems See HPI, otherwise 12 point ROS negative.   Objective:   Physical Exam Gen: up in chair, NAD CV: RRR, no murmurs auscultated PULM: CTAB, no wheezes, rales, rhoncii ABD: S/NT/+ bowel sounds  EXT: 2+ peripheral pulses   Assessment & Plan:

## 2011-04-01 ENCOUNTER — Ambulatory Visit (INDEPENDENT_AMBULATORY_CARE_PROVIDER_SITE_OTHER): Payer: Self-pay | Admitting: Family Medicine

## 2011-04-01 ENCOUNTER — Encounter: Payer: Self-pay | Admitting: Family Medicine

## 2011-04-01 VITALS — BP 127/85 | HR 61 | Temp 97.6°F | Ht 66.0 in | Wt 197.0 lb

## 2011-04-01 DIAGNOSIS — R5383 Other fatigue: Secondary | ICD-10-CM

## 2011-04-01 DIAGNOSIS — I10 Essential (primary) hypertension: Secondary | ICD-10-CM

## 2011-04-01 DIAGNOSIS — R5381 Other malaise: Secondary | ICD-10-CM

## 2011-04-01 NOTE — Assessment & Plan Note (Signed)
Self limiting for 1 day. Discussed to come back for visit if this persists. Pt agreeable.

## 2011-04-01 NOTE — Patient Instructions (Signed)

## 2011-04-01 NOTE — Progress Notes (Signed)
S:  Pt is here for general follow up visit. Appt was intially scheduled to discuss generalized malaise. However, pt states that malaise lasted one day and has otherwise resolved.    HTN:  Checking BPS Daily ?:NO; CANNOT AFFORD BP CUFF YET BP Range:N/A Any HA, CP, SOB?:NO Any Medication Side Effects:NO Medication Compliance:YES; IS TAKING NORVASC AND LISINOPRIL/HCTZ AS PRESCRIBED Salt intake?:LOW PER PT Exercise?:SOME WALKING Weight Loss?:NO         O:  Current outpatient prescriptions:amLODipine (NORVASC) 10 MG tablet, Take 1 tablet (10 mg total) by mouth daily., Disp: 30 tablet, Rfl: 11;  aspirin EC 81 MG tablet, Take 1 tablet (81 mg total) by mouth daily., Disp: 150 tablet, Rfl: 2;  lisinopril-hydrochlorothiazide (ZESTORETIC) 20-12.5 MG per tablet, Take 2 tablets by mouth daily., Disp: 60 tablet, Rfl: 11  Wt Readings from Last 3 Encounters:  04/01/11 197 lb (89.359 kg)  03/08/11 198 lb (89.812 kg)  03/02/11 198 lb (89.812 kg)   Temp Readings from Last 3 Encounters:  04/01/11 97.6 F (36.4 C) Oral  02/04/11 98.3 F (36.8 C) Oral  11/18/10 98 F (36.7 C) Oral   BP Readings from Last 3 Encounters:  04/01/11 127/85  03/08/11 144/75  03/02/11 165/80   Pulse Readings from Last 3 Encounters:  04/01/11 61  03/08/11 58  03/02/11 61    General: alert and cooperative HEENT: PERRLA, extra ocular movement intact, sclera clear, anicteric and oropharynx clear, no lesions Heart: S1, S2 normal, no murmur, rub or gallop, regular rate and rhythm Lungs: clear to auscultation, no wheezes or rales and unlabored breathing Abdomen: abdomen is soft without significant tenderness, masses, organomegaly or guarding Extremities: extremities normal, atraumatic, no cyanosis or edema Skin:no rashes, mild generalized hyperpigmentation c/w chronic smoking    A/P:

## 2011-04-01 NOTE — Assessment & Plan Note (Signed)
Improved with norvasc and lis/HCTZ. Will check Cr and K today. Handout given.

## 2011-04-02 LAB — BASIC METABOLIC PANEL
BUN: 20 mg/dL (ref 6–23)
CO2: 23 mEq/L (ref 19–32)
Chloride: 102 mEq/L (ref 96–112)
Creat: 1.05 mg/dL (ref 0.50–1.35)
Glucose, Bld: 88 mg/dL (ref 70–99)
Potassium: 4.3 mEq/L (ref 3.5–5.3)

## 2011-05-27 ENCOUNTER — Encounter: Payer: Self-pay | Admitting: Family Medicine

## 2011-05-27 ENCOUNTER — Ambulatory Visit (INDEPENDENT_AMBULATORY_CARE_PROVIDER_SITE_OTHER): Payer: Self-pay | Admitting: Family Medicine

## 2011-05-27 ENCOUNTER — Telehealth: Payer: Self-pay | Admitting: Family Medicine

## 2011-05-27 DIAGNOSIS — I10 Essential (primary) hypertension: Secondary | ICD-10-CM

## 2011-05-27 DIAGNOSIS — R05 Cough: Secondary | ICD-10-CM

## 2011-05-27 DIAGNOSIS — G4700 Insomnia, unspecified: Secondary | ICD-10-CM

## 2011-05-27 LAB — CBC
HCT: 45 % (ref 39.0–52.0)
Hemoglobin: 14.8 g/dL (ref 13.0–17.0)
WBC: 9.3 10*3/uL (ref 4.0–10.5)

## 2011-05-27 MED ORDER — LOSARTAN POTASSIUM 50 MG PO TABS: 50.0000 mg | ORAL_TABLET | Freq: Every day | ORAL | Status: DC

## 2011-05-27 MED ORDER — HYDROCHLOROTHIAZIDE 25 MG PO TABS: 25.0000 mg | ORAL_TABLET | Freq: Every day | ORAL | Status: DC

## 2011-05-27 MED ORDER — TRIAMTERENE-HCTZ 37.5-25 MG PO TABS: 1.0000 | ORAL_TABLET | Freq: Every day | ORAL | Status: DC

## 2011-05-27 MED ORDER — ALBUTEROL SULFATE HFA 108 (90 BASE) MCG/ACT IN AERS: 2.0000 | INHALATION_SPRAY | Freq: Four times a day (QID) | RESPIRATORY_TRACT | Status: DC | PRN

## 2011-05-27 NOTE — Telephone Encounter (Signed)
States that they cannot afford the Lorsatin and inhaler - costs way too much.  [facility]- [place]

## 2011-05-27 NOTE — Patient Instructions (Signed)
It was good to see you today I would like to get a chest xray for your cough.  STOP the lisinopril START the cozaar and HCTZ STOP smoking I am also prescribing an albuterol inhaler  For your insomnia, stop drinking coffee before bed time  I am setting you up for a sleep study   Come back to see me in 2 weeks, Call with any questions,  God Bless, Doree Albee MD.

## 2011-05-27 NOTE — Telephone Encounter (Signed)
Returned call to patient and left message.  Will route note to Dr. Alvester Morin and call patient back on Monday.  Gaylene Brooks, RN

## 2011-05-28 LAB — BASIC METABOLIC PANEL
Calcium: 10 mg/dL (ref 8.4–10.5)
Sodium: 138 mEq/L (ref 135–145)

## 2011-05-30 DIAGNOSIS — G4700 Insomnia, unspecified: Secondary | ICD-10-CM | POA: Insufficient documentation

## 2011-05-30 DIAGNOSIS — R05 Cough: Secondary | ICD-10-CM | POA: Insufficient documentation

## 2011-05-30 NOTE — Assessment & Plan Note (Signed)
Also with broad differential for this. Caffeine prior to sleep is a major offending agent. Instructed pt to avoid this. Given daytime somnolence, will also set up for sleep study to rule out OSA. Will follow up in 1-2 weeks.

## 2011-05-30 NOTE — Assessment & Plan Note (Signed)
Broad differential for this in setting of longstanding smoking hx. Pt is also noted to be on an ACEi. This was started several months ago. Will transition to ARB. Will obtain CXR to r/o mass or COPD changes. Will also place pt on trial of albuterol. Plan to follow up in 1-2 weeks. Pt may  benefit from formal PFTs in 4-6 weeks as to whether pt may benefit from long acting anticholinergic vs. Inhaled steroid +.

## 2011-05-30 NOTE — Assessment & Plan Note (Signed)
Changing from ACE to ARB given cough today.

## 2011-05-30 NOTE — Telephone Encounter (Signed)
Called to ask about this again today

## 2011-05-30 NOTE — Progress Notes (Signed)
  Subjective:    Patient ID: Jonathan [name], male    DOB: Nov 28, 1950, 61 y.o.   MRN: [id number]  HPI Pt presents today with 2 complaints:  Cough: Pt states that he has had an issue of longstanding cough. Pt states that cough has been intermittent and has been present for several months. Pt states that he has cough waking him up at night over the last 2-3 months. Pt is known to be a longstanding smoker. No fevers. No recent sick contacts. Pt has had intermittent periods of wheezing. No reflux symptoms per pt. No morning hoarseness.   Insomnia: Pt also reports a 2-3 month history of trouble sleeping. Patient states he wakes up at least 2-3 times a night been unable to go back to sleep. Patient denies any urinary urgency or nocturia associated with these episodes. Report episodes of shortness of breath associated with waking up. Patient has a long-standing history of significant snoring while sleeping. Patient also reports daytime somnolence. Patient feels that his trouble sleeping may also be related to stress as patient has been out of work for several months and is in the process of trying to apply for disability. Patient also reports drinking a cup of coffee before bedtime every night.   Review of Systems See HPI, otherwise ROS negative     Objective:   Physical Exam Gen: up in chair, NAD HEENT: NCAT, EOMI, TMs clear bilaterally, poor dentition  CV: RRR, no murmurs auscultated PULM: CTAB, no wheezes, rales, rhoncii ABD: S/NT/+ bowel sounds  EXT: 2+ peripheral pulses   Assessment & Plan:

## 2011-05-31 NOTE — Telephone Encounter (Signed)
Attempted to call pt back to discuss medications. Had to leave VM. Told pt to call back to discuss particular medication options, also discussed following up with health department to discuss medications.

## 2011-06-01 ENCOUNTER — Ambulatory Visit (HOSPITAL_BASED_OUTPATIENT_CLINIC_OR_DEPARTMENT_OTHER): Payer: Self-pay | Attending: Family Medicine | Admitting: Radiology

## 2011-06-01 VITALS — Ht 67.0 in | Wt 197.0 lb

## 2011-06-01 DIAGNOSIS — G4700 Insomnia, unspecified: Secondary | ICD-10-CM

## 2011-06-01 DIAGNOSIS — G4733 Obstructive sleep apnea (adult) (pediatric): Secondary | ICD-10-CM | POA: Insufficient documentation

## 2011-06-14 ENCOUNTER — Encounter: Payer: Self-pay | Admitting: Family Medicine

## 2011-06-14 ENCOUNTER — Ambulatory Visit (INDEPENDENT_AMBULATORY_CARE_PROVIDER_SITE_OTHER): Payer: Self-pay | Admitting: Family Medicine

## 2011-06-14 VITALS — BP 154/93 | HR 76 | Ht 67.0 in | Wt 198.0 lb

## 2011-06-14 DIAGNOSIS — R05 Cough: Secondary | ICD-10-CM

## 2011-06-14 DIAGNOSIS — I10 Essential (primary) hypertension: Secondary | ICD-10-CM

## 2011-06-14 DIAGNOSIS — G4733 Obstructive sleep apnea (adult) (pediatric): Secondary | ICD-10-CM

## 2011-06-14 MED ORDER — ALBUTEROL SULFATE HFA 108 (90 BASE) MCG/ACT IN AERS: 2.0000 | INHALATION_SPRAY | Freq: Four times a day (QID) | RESPIRATORY_TRACT | Status: DC | PRN

## 2011-06-14 NOTE — Progress Notes (Signed)
  Subjective:    Patient ID: Jonathan [name], male    DOB: 15-Nov-1950, 61 y.o.   MRN: [id number]  HPI Patient is here for followup on cough and hypertension.  Patient is noted to have been transitioned over from lisinopril  to cozaar in setting of persistent cough. Patient states he has not been compliant with this medication change secondary to cost. Patient states he has not been compliant with his other antihypertensive medications because of cost. Per patient, he is in process of possible of applying for disability and does not have any insurance to cover medications. This was discussed with patient previously with the patient been instructed to send prescriptions, i particular cozaar to  [facility] as this was on there  discount plan. Patient states he still has not been able to afford this. She denies any headache chest pain or shortness of breath associate was hypertension.  Patient states the cough is still persistent since last clinical visit because he continued lisinopril up until last week.  Patient denies any tongue swelling or wheezing. A chest x-ray was also orederd at last visit to rule out any type of mass in given a an extensive smoking history. Patient states that he has not obtained a chest x-ray either.   Review of Systems See HPI, otherwise ROS negative     Objective:   Physical Exam Gen: up in chair, NAD HEENT: NCAT, EOMI, TMs clear bilaterally CV: RRR, no murmurs auscultated PULM: CTAB, no wheezes, rales, rhoncii ABD: S/NT/+ bowel sounds  EXT: 2+ peripheral pulses   Assessment & Plan:

## 2011-06-14 NOTE — Patient Instructions (Signed)
It was good to see today Take albuterol prescription to the health department see the can help with this Keep taking the hydrochlorothiazide I will contact you with the results of the sleep study Come back to see me in about 4-6 weeks Call if any questions, God Bless, Doree Albee MD

## 2011-06-15 DIAGNOSIS — G4733 Obstructive sleep apnea (adult) (pediatric): Secondary | ICD-10-CM

## 2011-06-15 NOTE — Procedures (Signed)
Jonathan [name], Jonathan [name]                ACCOUNT NO.:  [id number]  MEDICAL RECORD NO.:  [id number]          PATIENT TYPE:  OUT  LOCATION:  SLEEP CENTER                 FACILITY:  [facility]  PHYSICIAN:  Barbaraann Share, MD,FCCPDATE OF BIRTH:  07-Nov-1950  DATE OF STUDY:  06/01/2011                           NOCTURNAL POLYSOMNOGRAM  REFERRING PHYSICIAN:  Doree Albee, MD  REFERRING PHYSICIANS:  Doree Albee, MD  INDICATION FOR STUDY:  Hypersomnia with sleep apnea.  EPWORTH SLEEPINESS SCORE:  17.  MEDICATIONS:  SLEEP ARCHITECTURE:  The patient had a total sleep time of 290 minutes with no slow-wave sleep and decreased quantity of REM.  Sleep onset latency was mildly prolonged at 35 minutes and REM onset was normal. Sleep efficiency was poor at 65% during the diagnostic portion of the study and increased mildly to 73% during the titration portion.  RESPIRATORY DATA:  The patient underwent a split night protocol, where he was found to have 38 obstructive and central events in the 131 minutes of sleep.  This gave him an apnea-hypopnea index during the diagnostic portion of the study of 18 events per hour.  The events occurred more commonly in the supine position and there was loud snoring noted throughout.  By protocol, the patient was then fitted with a ResMed Quattro full-face mask with the size not being noted by the sleep technician.  Titration was started per protocol, however, the patient had difficulty exhaling against the expiratory pressure.  It was subsequently changed to bilevel and the pressure was increased in order to control obstructive and central events as well as snoring.  At a pressure of 12/6, the patient appeared to have fairly good control with only minor breakthrough, however, continued to snore.  Attempts to increase pressure past this point only resulted in pressure-induced central apneas.  Ultimately, the patient was not able to be successfully titrated because of  the short time available as part of the split night protocol.  OXYGEN DATA:  There was O2 desaturation as low as 83% with the patient's events.  CARDIAC DATA:  No clinically significant arrhythmias were noted.  MOVEMENT-PARASOMNIA:  The patient had no significant leg jerks or other abnormal behaviors seen.  IMPRESSIONS-RECOMMENDATIONS:  Split night study reveals mild obstructive sleep apnea/hypopnea syndrome with an apnea-hypopnea index of 18 events per hour, and O2 desaturation as low as 83%.  By protocol, he was then fitted with a ResMed Quattro full-face mask, and titration was initiated.  He appeared to have the best control at a pressure of 12/6, however, had some breakthrough events.  Attempts to increase the pressure further only resulted in pressure-induced central apneas.  The patient was never able to be adequately titrated because of the time constraints of a split night study.  Treatment for his degree of sleep apnea can include a trial of weight loss alone if applicable, upper airway surgery, dental appliance, and also CPAP.  If a positive pressure device is chosen as his primary mode of therapy, would highly recommend a return to the sleep center for a formal pressure titration given his incidence of pressure-induced central apneas.     Barbaraann Share, MD,FCCP Diplomate, American Board  of Sleep Medicine    [facility]/MEDQ  D:  06/15/2011 08:17:37  T:  06/15/2011 09:07:03  Job:  [id number]

## 2011-06-17 ENCOUNTER — Encounter: Payer: Self-pay | Admitting: Family Medicine

## 2011-06-17 DIAGNOSIS — G4733 Obstructive sleep apnea (adult) (pediatric): Secondary | ICD-10-CM | POA: Insufficient documentation

## 2011-06-17 NOTE — Assessment & Plan Note (Signed)
Noted OSA by sleep study. Will set up for CPAP pending formal study results.

## 2011-06-17 NOTE — Assessment & Plan Note (Signed)
Elevated today with medical noncompliance of multifactorial etiology. Cost is likely major contributor. Instructed pt to d/c lisinopril given persistence of cough. Will continue with HCTZ. Will also d/c norvasc. Given that pt now has dx of OSA. Proper treatment of this may also help improve HTN. Will follow up in 2-3 months.

## 2011-06-17 NOTE — Assessment & Plan Note (Signed)
Still present. Pt has not stopped taking ACEi, so this is still on differential. Pt has also not obtained CXR to rule PNA vs. Mass (longstanding smoking history). Instructed to stop ACE and obtain CXR. Will otherwise continue to follow.

## 2011-07-01 ENCOUNTER — Telehealth: Payer: Self-pay | Admitting: Family Medicine

## 2011-07-01 NOTE — Telephone Encounter (Signed)
Forward to Dr Alvester Morin, patient wanting results of sleep study, his next appointment is 07/20/11.Marzella Miracle, Rodena Medin

## 2011-07-01 NOTE — Telephone Encounter (Signed)
Wife is calling for the sleep study results.

## 2011-07-04 ENCOUNTER — Other Ambulatory Visit: Payer: Self-pay | Admitting: Family Medicine

## 2011-07-04 DIAGNOSIS — G4733 Obstructive sleep apnea (adult) (pediatric): Secondary | ICD-10-CM

## 2011-07-04 NOTE — Telephone Encounter (Signed)
Wife is calling back.  She hasn't heard back from Dr. Alvester Morin.

## 2011-07-04 NOTE — Telephone Encounter (Signed)
Spoke with pt over the phone with results of sleep study (recs were weight loss vs. Dental appliance vs. Reevaluation for CPAP titration at sleep center). Pt agreeable to being sent back to sleep center for formal CPAP titration (formal titration was not able to be fully completed during split night).

## 2011-07-04 NOTE — Telephone Encounter (Signed)
Fwd. To PCP to address .[address]ner  

## 2011-07-06 ENCOUNTER — Telehealth: Payer: Self-pay | Admitting: Family Medicine

## 2011-07-06 NOTE — Telephone Encounter (Signed)
Melissa from [facility] is calling to get clarification on the Referral for CPAP on this patient.

## 2011-07-06 NOTE — Telephone Encounter (Signed)
Pt has appt 4/4 at [facility] for titration issues.

## 2011-07-20 ENCOUNTER — Ambulatory Visit (INDEPENDENT_AMBULATORY_CARE_PROVIDER_SITE_OTHER): Payer: Self-pay | Admitting: Family Medicine

## 2011-07-20 ENCOUNTER — Encounter: Payer: Self-pay | Admitting: Family Medicine

## 2011-07-20 VITALS — BP 122/81 | HR 66 | Ht 66.5 in | Wt 198.7 lb

## 2011-07-20 DIAGNOSIS — G4733 Obstructive sleep apnea (adult) (pediatric): Secondary | ICD-10-CM

## 2011-07-20 DIAGNOSIS — I10 Essential (primary) hypertension: Secondary | ICD-10-CM

## 2011-07-20 DIAGNOSIS — R05 Cough: Secondary | ICD-10-CM

## 2011-07-20 LAB — COMPREHENSIVE METABOLIC PANEL
AST: 23 U/L (ref 0–37)
Albumin: 4.8 g/dL (ref 3.5–5.2)
BUN: 25 mg/dL — ABNORMAL HIGH (ref 6–23)
Calcium: 9.6 mg/dL (ref 8.4–10.5)
Chloride: 104 mEq/L (ref 96–112)
Glucose, Bld: 99 mg/dL (ref 70–99)
Potassium: 4.7 mEq/L (ref 3.5–5.3)
Sodium: 137 mEq/L (ref 135–145)
Total Protein: 7.6 g/dL (ref 6.0–8.3)

## 2011-07-20 LAB — LDL CHOLESTEROL, DIRECT: Direct LDL: 141 mg/dL — ABNORMAL HIGH

## 2011-07-20 MED ORDER — TRIAMTERENE-HCTZ 37.5-25 MG PO TABS: 1.0000 | ORAL_TABLET | Freq: Every day | ORAL | Status: DC

## 2011-07-20 MED ORDER — ALBUTEROL SULFATE HFA 108 (90 BASE) MCG/ACT IN AERS: 2.0000 | INHALATION_SPRAY | Freq: Four times a day (QID) | RESPIRATORY_TRACT | Status: DC | PRN

## 2011-07-20 MED ORDER — LOSARTAN POTASSIUM 50 MG PO TABS: 50.0000 mg | ORAL_TABLET | Freq: Every day | ORAL | Status: DC

## 2011-07-20 NOTE — Assessment & Plan Note (Signed)
Pending positive pressure titration. We'll followup on this at next visit.

## 2011-07-20 NOTE — Assessment & Plan Note (Signed)
Blood pressure at goal today with lisinopril monotherapy.  Patient has been otherwise very difficult to control secondary to compliance and cost issues with medications. Patient has vacillated with multiple medications including hydrochlorothiazide, Norvasc, losartan, as well as lisinopril. Discussed with patient that he needs stop the lisinopril secondary to cough. Told patient that I will give physical prescriptions for Maxzide as well as Cozaar. These are to be taken to the [facility]. Told patient to take just the Maxide and come back in for blood pressure recheck. Discussed with patient that if blood pressure still significantly elevated with just Maxide that we would start Cozaar. Will also check labs today including LDL and CMET today. Patient is also in process of getting his sleep apnea positive pressure apparatus titrated. This should also help with blood pressure management. Will defer any other blood pressure measures until this is established.

## 2011-07-20 NOTE — Progress Notes (Signed)
S:   HTN:  Checking BPS Daily ?:no; intermittently  BP Range:140s-160 Any HA, CP, SOB?:no Any Medication Side Effects:yes; pt was previously instructed to stop lisinopril 2/2 cough. Pt states that he restarted this medication because of cost of other BP meds. Pt states that cough improved during period that he was not taking lisinopril. Cough has returned since restarting. No tongue or facial swelling.  Medication Compliance:no; issues with cost and adherence  Salt intake?:low per pt.  Exercise?:try to walk daily         O:  Current Outpatient Prescriptions  Medication Sig Dispense Refill  . albuterol (PROVENTIL HFA;VENTOLIN HFA) 108 (90 BASE) MCG/ACT inhaler Inhale 2 puffs into the lungs every 6 (six) hours as needed for wheezing.  1 Inhaler  0  . aspirin EC 81 MG tablet Take 1 tablet (81 mg total) by mouth daily.  150 tablet  2  . hydrochlorothiazide (HYDRODIURIL) 25 MG tablet Take 1 tablet (25 mg total) by mouth daily.  30 tablet  11  . losartan (COZAAR) 50 MG tablet Take 1 tablet (50 mg total) by mouth daily.  90 tablet  3  . triamterene-hydrochlorothiazide (MAXZIDE-25) 37.5-25 MG per tablet Take 1 each (1 tablet total) by mouth daily.  90 tablet  3    Wt Readings from Last 3 Encounters:  07/20/11 198 lb 11.2 oz (90.13 kg)  06/14/11 198 lb (89.812 kg)  06/01/11 197 lb (89.359 kg)   Temp Readings from Last 3 Encounters:  05/27/11 98.2 F (36.8 C) Oral  04/01/11 97.6 F (36.4 C) Oral  02/04/11 98.3 F (36.8 C) Oral   BP Readings from Last 3 Encounters:  07/20/11 122/81  06/14/11 154/93  05/27/11 130/80   Pulse Readings from Last 3 Encounters:  07/20/11 66  06/14/11 76  05/27/11 81    General: alert and cooperative HEENT: PERRLA and extra ocular movement intact Heart: S1, S2 normal, no murmur, rub or gallop, regular rate and rhythm Lungs: clear to auscultation, no wheezes or rales and unlabored breathing Abdomen: abdomen is soft without significant tenderness,  masses, organomegaly or guarding Extremities: extremities normal, atraumatic, no cyanosis or edema Skin:no rashes Neurology: normal without focal findings, mental status, speech normal, alert and oriented x3, PERLA and reflexes normal and symmetric   A/P:

## 2011-07-20 NOTE — Patient Instructions (Signed)
It was good to see today I am restarting you on monitor blood pressure medicines, Maxide Start this medication and come back in for blood pressure recheck If your blood pressure still elevated (>140/90), then we will start the of the medication that was prescribed I am checking your cholesterol and  some general blood work today Stop the lisinopril after these medications a picked up Come back to see me in 2-3 weeks Call if any questions God Bless, Doree Albee MD

## 2011-07-20 NOTE — Assessment & Plan Note (Addendum)
Improved off of lisinopril but has returned and patient restarted this medication. Discussed importance of cessation of lisinopril. Given significant smoking history patient will also benefit from outpatient PFTs. Would like to get this set up in the pending visits after sleep apnea issues have been addressed.

## 2011-07-28 ENCOUNTER — Ambulatory Visit (HOSPITAL_BASED_OUTPATIENT_CLINIC_OR_DEPARTMENT_OTHER): Payer: Medicaid Other | Attending: Family Medicine | Admitting: Radiology

## 2011-07-28 DIAGNOSIS — G4733 Obstructive sleep apnea (adult) (pediatric): Secondary | ICD-10-CM

## 2011-07-28 DIAGNOSIS — G4710 Hypersomnia, unspecified: Secondary | ICD-10-CM | POA: Insufficient documentation

## 2011-08-05 ENCOUNTER — Telehealth: Payer: Self-pay | Admitting: Family Medicine

## 2011-08-05 NOTE — Telephone Encounter (Signed)
Wife is calling about the results of sleep study.

## 2011-08-05 NOTE — Telephone Encounter (Signed)
Fwd. To PCP to review. Lorenda Hatchet, Renato Battles

## 2011-08-08 ENCOUNTER — Other Ambulatory Visit: Payer: Self-pay | Admitting: Family Medicine

## 2011-08-08 ENCOUNTER — Telehealth: Payer: Self-pay | Admitting: Family Medicine

## 2011-08-08 DIAGNOSIS — R05 Cough: Secondary | ICD-10-CM

## 2011-08-08 DIAGNOSIS — I10 Essential (primary) hypertension: Secondary | ICD-10-CM

## 2011-08-08 MED ORDER — LOSARTAN POTASSIUM 50 MG PO TABS: 50.0000 mg | ORAL_TABLET | Freq: Every day | ORAL | Status: DC

## 2011-08-08 MED ORDER — ALBUTEROL SULFATE HFA 108 (90 BASE) MCG/ACT IN AERS: 2.0000 | INHALATION_SPRAY | Freq: Four times a day (QID) | RESPIRATORY_TRACT | Status: DC | PRN

## 2011-08-08 MED ORDER — TRIAMTERENE-HCTZ 37.5-25 MG PO TABS: 1.0000 | ORAL_TABLET | Freq: Every day | ORAL | Status: DC

## 2011-08-08 NOTE — Telephone Encounter (Signed)
Medications called in. Please inform pt. Also inform pt that Sleep study results have not yet returned. Thank you.

## 2011-08-08 NOTE — Telephone Encounter (Signed)
Will forward message to Dr. Newton. 

## 2011-08-08 NOTE — Telephone Encounter (Signed)
Patient notified

## 2011-08-08 NOTE — Telephone Encounter (Signed)
Has lost the last 3 scripts that Dr Alvester Morin wrote for him.  Needs these called into [facility] states it was the inhaler and 2 BP meds.

## 2011-08-09 ENCOUNTER — Other Ambulatory Visit: Payer: Self-pay | Admitting: Family Medicine

## 2011-08-09 DIAGNOSIS — R05 Cough: Secondary | ICD-10-CM

## 2011-08-09 DIAGNOSIS — I10 Essential (primary) hypertension: Secondary | ICD-10-CM

## 2011-08-09 MED ORDER — TRIAMTERENE-HCTZ 37.5-25 MG PO TABS: 1.0000 | ORAL_TABLET | Freq: Every day | ORAL | Status: DC

## 2011-08-09 MED ORDER — ALBUTEROL SULFATE HFA 108 (90 BASE) MCG/ACT IN AERS: 2.0000 | INHALATION_SPRAY | Freq: Four times a day (QID) | RESPIRATORY_TRACT | Status: DC | PRN

## 2011-08-09 MED ORDER — LOSARTAN POTASSIUM 50 MG PO TABS: 50.0000 mg | ORAL_TABLET | Freq: Every day | ORAL | Status: DC

## 2011-08-10 DIAGNOSIS — G4730 Sleep apnea, unspecified: Secondary | ICD-10-CM

## 2011-08-10 DIAGNOSIS — G4710 Hypersomnia, unspecified: Secondary | ICD-10-CM

## 2011-08-10 NOTE — Procedures (Signed)
[name], [name]                ACCOUNT NO.:  [id number]  MEDICAL RECORD NO.:  [id number]          PATIENT TYPE:  OUT  LOCATION:  SLEEP CENTER                 FACILITY:  [facility]  PHYSICIAN:  Barbaraann Share, MD,FCCPDATE OF BIRTH:  01-25[birth year]1952  DATE OF STUDY:  07/28/2011                           NOCTURNAL POLYSOMNOGRAM  REFERRING PHYSICIAN:  Doree Albee, MD  INDICATION FOR STUDY:  Hypersomnia with sleep apnea.  The patient was found to have complex sleep apnea on a prior nocturnal polysomnogram.  EPWORTH SCORE:  20.  SLEEP ARCHITECTURE:  The patient had a total sleep time of 306 minutes with no slow wave sleep, but adequate quantity of REM.  Sleep onset latency was mildly prolonged at 57 minutes, and sleep efficiency was moderately reduced at 77%.  RESPIRATORY DATA:  The patient underwent a CPAP titration study, and was fitted with a medium Quattro FX full-face mask.  He was started on 5 cm of water pressure, and increased in order to control both his obstructive events and snoring.  The patient had complex sleep apnea on his prior study, however at a CPAP pressure of 12, was noted to have good control of obstructive events/central events/snoring.  Tolerance appeared to be excellent.  The patient should also be encouraged to work on weight loss.  OXYGEN DATA:  O2 desaturation as low as 88.  CARDIAC DATA:  No clinically significant arrhythmias were noted.  MOVEMENTS/PARASOMNIA:  The patient had no significant leg jerks or other abnormal behavior seen.  IMPRESSION/RECOMMENDATION:  Good control of previously diagnosed complex sleep apnea with a CPAP pressure of 12 cm, delivered by a medium Quattro FX full-face mask.     Barbaraann Share, MD,FCCP Diplomate, American Board of Sleep Medicine    [facility]/MEDQ  D:  08/10/2011 07:51:50  T:  08/10/2011 08:52:23  Job:  [id number]

## 2011-08-23 ENCOUNTER — Telehealth: Payer: Self-pay | Admitting: Family Medicine

## 2011-08-23 NOTE — Telephone Encounter (Signed)
Wants to know results of his sleep study

## 2011-08-25 ENCOUNTER — Telehealth: Payer: Self-pay | Admitting: *Deleted

## 2011-08-25 NOTE — Telephone Encounter (Signed)
Please call patient with results.

## 2011-08-25 NOTE — Telephone Encounter (Signed)
Called patient told him

## 2011-08-25 NOTE — Telephone Encounter (Signed)
Pt has no insurance.  Contacted [employer], if we fax over the normal documents they will verify his income and see if he can qualify thru one of their vendors.  MD states that he will give pt a call and let him know.  Faxed documents to [phone]. Paighton Godette, Maryjo Rochester

## 2011-08-27 NOTE — Telephone Encounter (Signed)
Called to follow up with pt about f/u sleep study results. Instructed pt that CPAP settings have been set (pressure of 12). Also instructed pt that because of insurance status (no insurance) that [facility] will be calling to ask some questions about possibly obtaining CPAP machine without any out of pocket costs. Pt expressed understanding.

## 2011-09-02 ENCOUNTER — Telehealth: Payer: Self-pay | Admitting: Family Medicine

## 2011-09-02 NOTE — Telephone Encounter (Signed)
Wife is calling back after leaving a message from this morning.

## 2011-09-02 NOTE — Telephone Encounter (Signed)
Wife is calling because the drug store says they need authorization for a refill for Losartan and his Medicaid so now he should be able to get the CPAP machine.

## 2011-09-07 ENCOUNTER — Encounter: Payer: Self-pay | Admitting: Family Medicine

## 2011-09-07 ENCOUNTER — Ambulatory Visit (INDEPENDENT_AMBULATORY_CARE_PROVIDER_SITE_OTHER): Payer: Medicaid Other | Admitting: Family Medicine

## 2011-09-07 VITALS — BP 157/96 | HR 85 | Temp 97.6°F | Ht 66.0 in | Wt 195.0 lb

## 2011-09-07 DIAGNOSIS — G4733 Obstructive sleep apnea (adult) (pediatric): Secondary | ICD-10-CM

## 2011-09-07 DIAGNOSIS — E785 Hyperlipidemia, unspecified: Secondary | ICD-10-CM

## 2011-09-07 DIAGNOSIS — I10 Essential (primary) hypertension: Secondary | ICD-10-CM

## 2011-09-07 LAB — BASIC METABOLIC PANEL
BUN: 21 mg/dL (ref 6–23)
Calcium: 9.9 mg/dL (ref 8.4–10.5)
Chloride: 101 mEq/L (ref 96–112)
Creat: 1.35 mg/dL (ref 0.50–1.35)

## 2011-09-07 MED ORDER — LOSARTAN POTASSIUM 50 MG PO TABS: 50.0000 mg | ORAL_TABLET | Freq: Every day | ORAL | Status: DC

## 2011-09-07 MED ORDER — PRAVASTATIN SODIUM 40 MG PO TABS: 40.0000 mg | ORAL_TABLET | Freq: Every day | ORAL | Status: DC

## 2011-09-07 NOTE — Patient Instructions (Signed)

## 2011-09-07 NOTE — Assessment & Plan Note (Signed)
Will refill cozaar. Pt now has medicaid to afford this. Continue maxzide. Check Cr and K.

## 2011-09-07 NOTE — Assessment & Plan Note (Addendum)
Started on pravachol. Handout given about low fat diet.

## 2011-09-07 NOTE — Progress Notes (Signed)
Subjective:   HTN:  Checking BPS Daily ?:intermittently  BP Range:150s-160s Any HA, CP, SOB?:no Any Medication Side Effects:no Medication Compliance:has been taking maxzide but not cozaar Salt intake?:low Exercise?:walking intermittently  Weight Loss?:no   Is in process of getting CPAP set up currently.  Also in process of getting disability approved.  Currently on medicaid.      Review of Systems - Negative except as noted above in HPI.   Objective:  Current Outpatient Prescriptions  Medication Sig Dispense Refill  . albuterol (PROVENTIL HFA;VENTOLIN HFA) 108 (90 BASE) MCG/ACT inhaler Inhale 2 puffs into the lungs every 6 (six) hours as needed for wheezing.  1 Inhaler  1  . aspirin EC 81 MG tablet Take 1 tablet (81 mg total) by mouth daily.  150 tablet  2  . hydrochlorothiazide (HYDRODIURIL) 25 MG tablet Take 1 tablet (25 mg total) by mouth daily.  30 tablet  11  . losartan (COZAAR) 50 MG tablet Take 1 tablet (50 mg total) by mouth daily.  90 tablet  3  . pravastatin (PRAVACHOL) 40 MG tablet Take 1 tablet (40 mg total) by mouth daily.  90 tablet  3  . triamterene-hydrochlorothiazide (MAXZIDE-25) 37.5-25 MG per tablet Take 1 each (1 tablet total) by mouth daily.  90 tablet  3  . DISCONTD: losartan (COZAAR) 50 MG tablet Take 1 tablet (50 mg total) by mouth daily.  90 tablet  3    Wt Readings from Last 3 Encounters:  09/07/11 195 lb (88.451 kg)  07/28/11 198 lb (89.812 kg)  07/20/11 198 lb 11.2 oz (90.13 kg)   Temp Readings from Last 3 Encounters:  09/07/11 97.6 F (36.4 C) Oral  05/27/11 98.2 F (36.8 C) Oral  04/01/11 97.6 F (36.4 C) Oral   BP Readings from Last 3 Encounters:  09/07/11 157/96  07/20/11 122/81  06/14/11 154/93   Pulse Readings from Last 3 Encounters:  09/07/11 85  07/20/11 66  06/14/11 76     General: alert and cooperative HEENT: PERRLA and extra ocular movement intact Heart: S1, S2 normal, no murmur, rub or gallop, regular rate and  rhythm Lungs: clear to auscultation, no wheezes or rales and unlabored breathing Abdomen: abdomen is soft without significant tenderness, masses, organomegaly or guarding Extremities: extremities normal, atraumatic, no cyanosis or edema Skin:no rashes Neurology: normal without focal findings   Assessment/Plan:

## 2011-09-07 NOTE — Assessment & Plan Note (Signed)
Pending CPAP set up.

## 2011-09-16 ENCOUNTER — Telehealth: Payer: Self-pay | Admitting: Family Medicine

## 2011-09-16 NOTE — Telephone Encounter (Signed)
Wife is calling because Medicaid is not paying for the Losartin and she is hoping that something different can be sent to [facility] on [address].

## 2011-09-18 ENCOUNTER — Encounter: Payer: Self-pay | Admitting: *Deleted

## 2011-09-23 ENCOUNTER — Other Ambulatory Visit: Payer: Self-pay | Admitting: Family Medicine

## 2011-09-23 MED ORDER — AMLODIPINE BESYLATE 10 MG PO TABS: 10.0000 mg | ORAL_TABLET | Freq: Every day | ORAL | Status: DC

## 2011-09-23 NOTE — Telephone Encounter (Signed)
norvasc called in. This is on [facility] discount list. Please inform pt. Thank you.

## 2011-10-05 ENCOUNTER — Ambulatory Visit (INDEPENDENT_AMBULATORY_CARE_PROVIDER_SITE_OTHER): Payer: Medicaid Other | Admitting: Family Medicine

## 2011-10-05 ENCOUNTER — Encounter: Payer: Self-pay | Admitting: Family Medicine

## 2011-10-05 VITALS — BP 124/73 | HR 59 | Ht 67.0 in | Wt 198.0 lb

## 2011-10-05 DIAGNOSIS — F17200 Nicotine dependence, unspecified, uncomplicated: Secondary | ICD-10-CM

## 2011-10-05 DIAGNOSIS — I10 Essential (primary) hypertension: Secondary | ICD-10-CM

## 2011-10-05 DIAGNOSIS — Z720 Tobacco use: Secondary | ICD-10-CM

## 2011-10-05 LAB — POCT GLYCOSYLATED HEMOGLOBIN (HGB A1C): Hemoglobin A1C: 6.1

## 2011-10-05 NOTE — Patient Instructions (Signed)

## 2011-10-05 NOTE — Progress Notes (Signed)
Subjective:  Hypertension: Checking BPS Daily ?:no; intermittently  BP Range:120s-140s Any HA, CP, SOB?:no Any Medication Side Effects:no Medication Compliance:yes; pt is currently only taking one pill for HTN. Pt is unsure if medication is norvasc or maxzide.  Salt intake?:low Exercise?:pt states that he is now walking daily  Weight Loss?:no        Review of Systems - Negative except as noted above in HPI  Objective:  Current Outpatient Prescriptions  Medication Sig Dispense Refill  . albuterol (PROVENTIL HFA;VENTOLIN HFA) 108 (90 BASE) MCG/ACT inhaler Inhale 2 puffs into the lungs every 6 (six) hours as needed for wheezing.  1 Inhaler  1  . amLODipine (NORVASC) 10 MG tablet Take 1 tablet (10 mg total) by mouth daily.  90 tablet  3  . aspirin EC 81 MG tablet Take 1 tablet (81 mg total) by mouth daily.  150 tablet  2  . pravastatin (PRAVACHOL) 40 MG tablet Take 1 tablet (40 mg total) by mouth daily.  90 tablet  3  . triamterene-hydrochlorothiazide (MAXZIDE-25) 37.5-25 MG per tablet Take 1 each (1 tablet total) by mouth daily.  90 tablet  3    Wt Readings from Last 3 Encounters:  10/05/11 198 lb (89.812 kg)  09/07/11 195 lb (88.451 kg)  07/28/11 198 lb (89.812 kg)   Temp Readings from Last 3 Encounters:  09/07/11 97.6 F (36.4 C) Oral  05/27/11 98.2 F (36.8 C) Oral  04/01/11 97.6 F (36.4 C) Oral   BP Readings from Last 3 Encounters:  10/05/11 124/73  09/07/11 157/96  07/20/11 122/81   Pulse Readings from Last 3 Encounters:  10/05/11 59  09/07/11 85  07/20/11 66    General: alert and cooperative HEENT: PERRLA Heart: S1, S2 normal, no murmur, rub or gallop, regular rate and rhythm Lungs: clear to auscultation, no wheezes or rales and unlabored breathing Abdomen: abdomen is soft without significant tenderness, masses, organomegaly or guarding Extremities: extremities normal, atraumatic, no cyanosis or edema Skin:no rashes Neurology: normal without focal  findings   Assessment/Plan:

## 2011-10-05 NOTE — Assessment & Plan Note (Signed)
Down to 1/2 PPD. Trying to self wean.

## 2011-10-05 NOTE — Assessment & Plan Note (Signed)
Well controlled currently. Will continue current regimen. Instructed pt to contact [facility] once he confirms which medication he is taking for HTN as to have accurate reflection in medication list.

## 2011-12-15 ENCOUNTER — Ambulatory Visit (INDEPENDENT_AMBULATORY_CARE_PROVIDER_SITE_OTHER): Payer: Self-pay | Admitting: Emergency Medicine

## 2011-12-15 ENCOUNTER — Encounter: Payer: Self-pay | Admitting: Emergency Medicine

## 2011-12-15 VITALS — BP 135/80 | HR 63 | Ht 66.0 in | Wt 195.0 lb

## 2011-12-15 DIAGNOSIS — J069 Acute upper respiratory infection, unspecified: Secondary | ICD-10-CM

## 2011-12-15 DIAGNOSIS — E785 Hyperlipidemia, unspecified: Secondary | ICD-10-CM

## 2011-12-15 DIAGNOSIS — I10 Essential (primary) hypertension: Secondary | ICD-10-CM

## 2011-12-15 DIAGNOSIS — L989 Disorder of the skin and subcutaneous tissue, unspecified: Secondary | ICD-10-CM

## 2011-12-15 LAB — COMPREHENSIVE METABOLIC PANEL
Albumin: 4.5 g/dL (ref 3.5–5.2)
BUN: 17 mg/dL (ref 6–23)
CO2: 24 mEq/L (ref 19–32)
Calcium: 9.5 mg/dL (ref 8.4–10.5)
Chloride: 101 mEq/L (ref 96–112)
Glucose, Bld: 88 mg/dL (ref 70–99)
Potassium: 4.4 mEq/L (ref 3.5–5.3)
Sodium: 135 mEq/L (ref 135–145)
Total Protein: 8 g/dL (ref 6.0–8.3)

## 2011-12-15 LAB — LDL CHOLESTEROL, DIRECT: Direct LDL: 124 mg/dL — ABNORMAL HIGH

## 2011-12-15 MED ORDER — ASPIRIN EC 81 MG PO TBEC: 81.0000 mg | DELAYED_RELEASE_TABLET | Freq: Every day | ORAL | Status: DC

## 2011-12-15 MED ORDER — TRIAMTERENE-HCTZ 37.5-25 MG PO TABS: 1.0000 | ORAL_TABLET | Freq: Every day | ORAL | Status: DC

## 2011-12-15 MED ORDER — PRAVASTATIN SODIUM 40 MG PO TABS: 40.0000 mg | ORAL_TABLET | Freq: Every day | ORAL | Status: DC

## 2011-12-15 MED ORDER — TRIAMCINOLONE ACETONIDE 0.1 % EX CREA: TOPICAL_CREAM | Freq: Two times a day (BID) | CUTANEOUS | Status: DC

## 2011-12-15 MED ORDER — AMLODIPINE BESYLATE 10 MG PO TABS: 10.0000 mg | ORAL_TABLET | Freq: Every day | ORAL | Status: DC

## 2011-12-15 NOTE — Progress Notes (Signed)
  Subjective:    Patient ID: Jonathan [name], male    DOB: 1950/09/01, 61 y.o.   MRN: [id number]  HPI Jonathan [name] is here for follow up.  Hypertension Well controlled: yes Compliant with medication: yes Side effects from medication: no Check BP at home: no  Chest pain: no Palpitations: no Vision changes: no Leg edema: no Dizziness: no  Hyperlipidemia Well controlled: no Compliant with meds: yes Side effects form meds: no  Sinus infection Reports 2 days of nasal congestion, clear nasal drainage, scratchy throat, cough.  Subjective fever yesterday, none today.  Watery eyes, no ear symptoms.    Skin lesion Reports spot on his left thigh that was present for a year or so then resolved and moved to a new spot an inch or two over.  + itching, intermittently gets bigger.  I have reviewed and updated the following as appropriate: allergies and current medications SHx: current smoker   Review of Systems See HPI    Objective:   Physical Exam BP 135/80  Pulse 63  Ht 5\' 6"  (1.676 m)  Wt 195 lb (88.451 kg)  BMI 31.47 kg/m2 Gen: alert, cooperative, NAD HEENT: AT/[place], sclera white, PERRL, no conjunctival injection, no sinus tenderness, no pharyngeal erythema or exudate, TMs normal bilaterally Neck: no LAD, supple CV: RRR, no murmurs Pulm: CTAB Ext: no edema Skin: 1cm raised erythematous lesion with scale, may be some central clearing       Assessment & Plan:

## 2011-12-15 NOTE — Assessment & Plan Note (Signed)
Prominent nasal symptoms.  Discussed supportive care.  Instructed to return to clinic if nasal discharge becomes purulent or having fevers.

## 2011-12-15 NOTE — Assessment & Plan Note (Signed)
Unclear etiology; but given that it resolves and changes location unlikely to be cancer.  Will try triamcinolone cream BID, if no improvement or worsens in next 2 weeks return to clinic and will likely biopsy.

## 2011-12-15 NOTE — Assessment & Plan Note (Signed)
Compliant with medication.  Will check LDL today and titrate meds as needed.

## 2011-12-15 NOTE — Patient Instructions (Signed)
It was nice to see you!  We are getting some labs today.  You will get a letter in the mail in the next 2 weeks or a phone call if anything is wrong.  I have given you a prescription for triamcinolone cream - please apply twice a day to the spot on your thigh.  If it gets better, wonderful.  If it looks like it is getting worse, stop using the cream and return to clinic.  Follow up in 3 months for blood pressure and cholesterol.

## 2011-12-15 NOTE — Assessment & Plan Note (Signed)
Well controlled. Continue current medications  

## 2011-12-19 ENCOUNTER — Encounter: Payer: Self-pay | Admitting: Emergency Medicine

## 2011-12-28 ENCOUNTER — Telehealth: Payer: Self-pay | Admitting: Emergency Medicine

## 2011-12-28 DIAGNOSIS — E785 Hyperlipidemia, unspecified: Secondary | ICD-10-CM

## 2011-12-28 DIAGNOSIS — I10 Essential (primary) hypertension: Secondary | ICD-10-CM

## 2011-12-28 MED ORDER — TRIAMTERENE-HCTZ 37.5-25 MG PO TABS: 1.0000 | ORAL_TABLET | Freq: Every day | ORAL | Status: DC

## 2011-12-28 MED ORDER — PRAVASTATIN SODIUM 40 MG PO TABS: 40.0000 mg | ORAL_TABLET | Freq: Every day | ORAL | Status: DC

## 2011-12-28 MED ORDER — TRIAMCINOLONE ACETONIDE 0.1 % EX CREA: TOPICAL_CREAM | Freq: Two times a day (BID) | CUTANEOUS | Status: DC

## 2011-12-28 MED ORDER — AMLODIPINE BESYLATE 10 MG PO TABS: 10.0000 mg | ORAL_TABLET | Freq: Every day | ORAL | Status: DC

## 2011-12-28 MED ORDER — ASPIRIN EC 81 MG PO TBEC: 81.0000 mg | DELAYED_RELEASE_TABLET | Freq: Every day | ORAL | Status: DC

## 2011-12-28 NOTE — Telephone Encounter (Signed)
Called and informed patient of Rx at front desk ready for pick up.Busick, Rodena Medin

## 2011-12-28 NOTE — Telephone Encounter (Signed)
Reprinted prescriptions.  Will place with front desk for pick up.

## 2012-03-08 ENCOUNTER — Encounter: Payer: Self-pay | Admitting: Cardiovascular Disease

## 2012-03-16 ENCOUNTER — Ambulatory Visit (INDEPENDENT_AMBULATORY_CARE_PROVIDER_SITE_OTHER): Payer: Self-pay | Admitting: Family Medicine

## 2012-03-16 ENCOUNTER — Encounter: Payer: Self-pay | Admitting: Family Medicine

## 2012-03-16 VITALS — BP 147/75 | HR 64 | Temp 98.0°F | Ht 66.0 in | Wt 200.0 lb

## 2012-03-16 DIAGNOSIS — F17200 Nicotine dependence, unspecified, uncomplicated: Secondary | ICD-10-CM

## 2012-03-16 DIAGNOSIS — R05 Cough: Secondary | ICD-10-CM

## 2012-03-16 DIAGNOSIS — Z720 Tobacco use: Secondary | ICD-10-CM

## 2012-03-16 NOTE — Patient Instructions (Addendum)
You may try an allergic medication (Zyrtec, Allegra, Claritin)  Make sure you drink plenty of water and get a good night's sleep

## 2012-03-16 NOTE — Progress Notes (Signed)
  Subjective:    Patient ID: Jonathan [name], male    DOB: [birth year], 61 y.o.   MRN: [id number]  HPI # He has been sneezing and has had a mild cough and congestion for the past 24 hours.  His wife has similar symptoms.  He has not tried any medications for his symptoms.    Review of Systems Denies fevers/chills/nausea/vomiting/diarrhea/muscle aches/constipation/difficulty breathing  Allergies, medication, past medical history reviewed.  He smokes 1 ppd for a long time.   He has a history of COPD, OSA on CPAP     Objective:   Physical Exam GEN: NAD HEENT:   Head: [place]/AT   Eyes: normal conjunctiva without injection or tearing   Ears: TM clear bilaterally with good light reflex and without erythema or air-fluid level   Nose: no rhinorrhea, normal turbinates   Mouth: MMM; no tonsillar adenopathy; no oropharyngeal erythema NECK: no LAD PULM: NI WOB; CTAB without w/r/r     Assessment & Plan:

## 2012-03-16 NOTE — Assessment & Plan Note (Signed)
See below

## 2012-03-16 NOTE — Assessment & Plan Note (Signed)
He may have allergies versus early URI.  We discussed conservative symptomatic management, red flags, how tobacco may result in prolonged symptoms (he is not ready to quit).

## 2012-09-26 ENCOUNTER — Ambulatory Visit (INDEPENDENT_AMBULATORY_CARE_PROVIDER_SITE_OTHER): Payer: Self-pay | Admitting: Family Medicine

## 2012-09-26 ENCOUNTER — Encounter: Payer: Self-pay | Admitting: Family Medicine

## 2012-09-26 VITALS — BP 152/65 | HR 61 | Temp 98.8°F | Ht 67.0 in | Wt 194.1 lb

## 2012-09-26 DIAGNOSIS — J019 Acute sinusitis, unspecified: Secondary | ICD-10-CM

## 2012-09-26 DIAGNOSIS — F17200 Nicotine dependence, unspecified, uncomplicated: Secondary | ICD-10-CM

## 2012-09-26 DIAGNOSIS — Z720 Tobacco use: Secondary | ICD-10-CM

## 2012-09-26 MED ORDER — AMOXICILLIN-POT CLAVULANATE 875-125 MG PO TABS: 1.0000 | ORAL_TABLET | Freq: Two times a day (BID) | ORAL | Status: DC

## 2012-09-26 NOTE — Patient Instructions (Addendum)
I sent in an antibiotic for your sinus infection. Please make an appointment with Dr. Raymondo Band for smoking cessation.

## 2012-09-27 NOTE — Assessment & Plan Note (Signed)
Will err on the side of antibiotic treatment for his symptom complex in that wife with COPD, recently hospitalized.

## 2012-09-27 NOTE — Progress Notes (Signed)
  Subjective:    Patient ID: Jonathan [name], male    DOB: 1950/09/09, 62 y.o.   MRN: [id number]  HPI C/O one week hx of sinus infection.  Many family members with "cold" Wife with COPD and recently hospitalized with exacerbation.  Seems to be a head cold.  Is a smoker and does have some cough.  No fever.  Perhaps interested in smoking cessation - both for him and for his wife to avoid passive smoke.    Review of Systems     Objective:   Physical Exam  Minimal facial pain to percussion TMs normal Throat normal Neck no adenopathy Lungs clear.        Assessment & Plan:

## 2012-09-27 NOTE — Assessment & Plan Note (Signed)
Perhaps interested in cessation.  Referral to Dr. Raymondo Band

## 2012-10-03 ENCOUNTER — Ambulatory Visit: Payer: Self-pay | Admitting: Emergency Medicine

## 2012-10-04 ENCOUNTER — Ambulatory Visit: Payer: Self-pay | Admitting: Pharmacist

## 2012-10-25 ENCOUNTER — Ambulatory Visit (INDEPENDENT_AMBULATORY_CARE_PROVIDER_SITE_OTHER): Payer: Medicaid Other | Admitting: Pharmacist

## 2012-10-25 ENCOUNTER — Encounter: Payer: Self-pay | Admitting: Emergency Medicine

## 2012-10-25 ENCOUNTER — Encounter: Payer: Self-pay | Admitting: Pharmacist

## 2012-10-25 VITALS — BP 165/96 | HR 65 | Ht 66.0 in | Wt 195.5 lb

## 2012-10-25 DIAGNOSIS — F17200 Nicotine dependence, unspecified, uncomplicated: Secondary | ICD-10-CM

## 2012-10-25 DIAGNOSIS — E785 Hyperlipidemia, unspecified: Secondary | ICD-10-CM

## 2012-10-25 DIAGNOSIS — Z720 Tobacco use: Secondary | ICD-10-CM

## 2012-10-25 DIAGNOSIS — I10 Essential (primary) hypertension: Secondary | ICD-10-CM

## 2012-10-25 MED ORDER — PRAVASTATIN SODIUM 40 MG PO TABS: 40.0000 mg | ORAL_TABLET | Freq: Every day | ORAL | Status: DC

## 2012-10-25 MED ORDER — TRIAMTERENE-HCTZ 37.5-25 MG PO TABS: 1.0000 | ORAL_TABLET | Freq: Every day | ORAL | Status: DC

## 2012-10-25 NOTE — Progress Notes (Signed)
  Subjective:    Patient ID: Jonathan [name], male    DOB: [birth year], 62 y.o.   MRN: [id number]  HPI  Age when started using tobacco on a daily basis 13. Number of Cigarettes per day 20. Brand smoked Marlboro. Estimated Nicotine Content per Cigarette (mg) 1.  Estimated Nicotine intake per day 20g.   Estimated Fagerstrom Score >5/10.  Most recent quit attempt - none recently - Quit in 1982 for 1 month.  Longest time ever been tobacco free 1 month. What Medications (NRT, bupropion, varenicline) used in past includes none Rates IMPORTANCE of quitting tobacco on 1-10 scale of 10. Rates READINESS of quitting tobacco on 1-10 scale of 10. Rates CONFIDENCE of quitting tobacco on 1-10 scale of 8. Triggers to use tobacco include; habit   Review of Systems     Objective:   Physical Exam        Assessment & Plan:  moderate Nicotine Dependence of ~49 years duration in a patient who is good candidate for success b/c of current level of motivation. He appears to be motivated by his desire to decrease second hand smoke around his wife AND he is motivated to stay around for his "grand babies".    Initiated varenicline tx with starting month pack.   New Rx called to pharmacy. Patient counseled on purpose, proper use, and potential adverse effects, including GI upset, and potential change in mood.   Written information provided. Provided information on 1 800-QUIT NOW support program.  F/U Rx Clinic Visit in early August.   Total time in face-to-face counseling 40 minutes.    Also restarted Maxzide AND Pravachol today as patient has been out of both of these medications since losing his medicaid coverage.  Instructed to restart aspirin.

## 2012-10-25 NOTE — Assessment & Plan Note (Addendum)
moderate Nicotine Dependence of ~49 years duration in a patient who is good candidate for success b/c of current level of motivation. He appears to be motivated by his desire to decrease second hand smoke around his wife AND he is motivated to stay around for his "grand babies".    Initiated varenicline tx with starting month pack.   New Rx called to pharmacy. Patient counseled on purpose, proper use, and potential adverse effects, including GI upset, and potential change in mood.   Patient will select QUit date for later in the month.   Written information provided. Provided information on 1 800-QUIT NOW support program.  F/U Rx Clinic Visit in early August.   Total time in face-to-face counseling 40 minutes.    Also restarted Maxzide AND Pravachol today as patient has been out of both of these medications since losing his medicaid coverage.  Instructed to restart aspirin.

## 2012-10-25 NOTE — Patient Instructions (Addendum)
Start Chantix starting month pack when you pick it up.   Remember to take with food.   Pick your quit date for later in the month.   Use the 1800 quit now  - quit line.    Next visit with Pharmacist in early August - Call next week to schedule.

## 2012-10-30 NOTE — Progress Notes (Signed)
Patient ID: Jonathan [name], male   DOB: 1950-06-24, 62 y.o.   MRN: [id number] Reviewed:  Agree with Dr. Macky Lower documentation and management.

## 2012-10-31 ENCOUNTER — Encounter: Payer: Self-pay | Admitting: Emergency Medicine

## 2012-10-31 ENCOUNTER — Ambulatory Visit (INDEPENDENT_AMBULATORY_CARE_PROVIDER_SITE_OTHER): Payer: Medicaid Other | Admitting: Emergency Medicine

## 2012-10-31 VITALS — BP 174/64 | HR 55 | Temp 99.2°F | Ht 67.0 in | Wt 194.4 lb

## 2012-10-31 DIAGNOSIS — J309 Allergic rhinitis, unspecified: Secondary | ICD-10-CM | POA: Insufficient documentation

## 2012-10-31 DIAGNOSIS — F17200 Nicotine dependence, unspecified, uncomplicated: Secondary | ICD-10-CM

## 2012-10-31 DIAGNOSIS — Z720 Tobacco use: Secondary | ICD-10-CM

## 2012-10-31 DIAGNOSIS — I10 Essential (primary) hypertension: Secondary | ICD-10-CM

## 2012-10-31 MED ORDER — AMOXICILLIN-POT CLAVULANATE 875-125 MG PO TABS: 1.0000 | ORAL_TABLET | Freq: Two times a day (BID) | ORAL | Status: DC

## 2012-10-31 MED ORDER — AMLODIPINE BESYLATE 10 MG PO TABS: 10.0000 mg | ORAL_TABLET | Freq: Every day | ORAL | Status: DC

## 2012-10-31 MED ORDER — LORATADINE 10 MG PO TABS: 10.0000 mg | ORAL_TABLET | Freq: Every day | ORAL | Status: DC

## 2012-10-31 NOTE — Assessment & Plan Note (Signed)
Elevated today. Has not taken medication this morning yet. Will continue Maxide, restart amlodipine. CMP and lipid profile today. Follow up in 2 weeks for RN BP check.

## 2012-10-31 NOTE — Assessment & Plan Note (Addendum)
Likely with super-imposed bacterial sinusitis. Unable to tolerate nasal sprays. Treat infection with Augment BID x10 days. Start loratadine 10mg  daily. Continue vicks vaporizer prn.

## 2012-10-31 NOTE — Assessment & Plan Note (Signed)
Will pick up Chantix today.

## 2012-10-31 NOTE — Progress Notes (Signed)
  Subjective:    Patient ID: Jonathan [name], male    DOB: 1950/12/27, 62 y.o.   MRN: [id number]  HPI BAILY SERPE is here for f/u of chronic issues and sinus trouble.  Hypertension Well controlled: no Compliant with medication: no - did not take maxide this morning; has not been taking amlodipine Side effects from medication: no Check BP at home: no  Chest pain: no Palpitations: no Vision changes: no Leg edema: no Dizziness: no  Sinus trouble He states that he has allergies and they have been worse over the last few weeks.  Now starting to cough up phlegm.  Denies any current rhinorrhea, but describes post-nasal drip.  No fevers, chills, shortness of breath.  He is not currently taking anything for his allergies.  He was tried on flonase in the past, but cannot tolerate nasal sprays.  He does use a vicks vaporizer which helps some.   I have reviewed and updated the following as appropriate: allergies and current medications SHx: current smoker  Review of Systems See HPI    Objective:   Physical Exam BP 174/64  Pulse 55  Temp(Src) 99.2 F (37.3 C) (Oral)  Ht 5\' 7"  (1.702 m)  Wt 194 lb 6.4 oz (88.179 kg)  BMI 30.44 kg/m2 Gen: alert, cooperative, NAD HEENT: AT/[place], sclera white, nasal turbinates swollen with mild erythema and some purulent discharge, MMM, mild pharyngeal erythema with cobblestoning, TMs normal bialterally Neck: supple, no LAD CV: RRR, no murmurs Pulm: CTAB, no wheezes or rales      Assessment & Plan:

## 2012-10-31 NOTE — Patient Instructions (Addendum)
It was nice to see you! I'm glad you are quitting smoking! Take loratadine once a day for the allergies. Take augmentin (an antibiotic) twice a day for 10 days. You should take 2 pills for your blood pressure, Maxide and amlodipine. Please make a nurse appointment in 2 weeks to recheck your blood pressure - make sure you are taking your medicine every day. Follow up with me in 3 months or sooner if needed.

## 2012-11-01 LAB — COMPREHENSIVE METABOLIC PANEL
ALT: 27 U/L (ref 0–53)
AST: 16 U/L (ref 0–37)
Albumin: 4.2 g/dL (ref 3.5–5.2)
BUN: 20 mg/dL (ref 6–23)
Calcium: 9.2 mg/dL (ref 8.4–10.5)
Chloride: 104 mEq/L (ref 96–112)
Potassium: 4.2 mEq/L (ref 3.5–5.3)
Sodium: 139 mEq/L (ref 135–145)
Total Protein: 7 g/dL (ref 6.0–8.3)

## 2012-11-01 LAB — LIPID PANEL: LDL Cholesterol: 103 mg/dL — ABNORMAL HIGH (ref 0–99)

## 2012-11-02 ENCOUNTER — Telehealth: Payer: Self-pay | Admitting: Emergency Medicine

## 2012-11-02 MED ORDER — ATORVASTATIN CALCIUM 40 MG PO TABS: 40.0000 mg | ORAL_TABLET | Freq: Every day | ORAL | Status: DC

## 2012-11-02 NOTE — Telephone Encounter (Signed)
Left voicemail for patient informing him that I am changing his cholesterol medication to atorvastatin.  This is on the Medicaid preferred list.  This change is due to his ASCVD 10-yr risk of 37.2%; he needs to be on a moderate to high intensity statin.  Will start atorvastatin 40mg  daily.  Instructed him to call the clinic with any questions.

## 2012-11-14 ENCOUNTER — Ambulatory Visit: Payer: Medicaid Other | Admitting: *Deleted

## 2012-11-14 VITALS — BP 162/77 | HR 60 | Resp 20

## 2012-11-14 DIAGNOSIS — I10 Essential (primary) hypertension: Secondary | ICD-10-CM

## 2012-11-14 NOTE — Progress Notes (Signed)
Pt here today for BP check - 162/77, pulse of 60, 99% o2 stat. Reports no headaches, vision changes or other symptoms. Has taken BP meds today. Has cut back to 3 cigarettes a day - Reminded of appointment with Dr. Raymondo Band on Friday, 8/13 appointment with Dr. Elwyn Reach and will follow up for another BP check the first week in August. Pt verbalized understanding. Wyatt Haste, RN-BSN

## 2012-11-16 ENCOUNTER — Ambulatory Visit: Payer: Medicaid Other | Admitting: Pharmacist

## 2012-12-05 ENCOUNTER — Ambulatory Visit: Payer: Medicaid Other | Admitting: Emergency Medicine

## 2013-02-28 ENCOUNTER — Encounter: Payer: Self-pay | Admitting: Emergency Medicine

## 2013-02-28 ENCOUNTER — Other Ambulatory Visit: Payer: Self-pay

## 2013-02-28 ENCOUNTER — Ambulatory Visit (INDEPENDENT_AMBULATORY_CARE_PROVIDER_SITE_OTHER): Payer: Medicaid Other | Admitting: Emergency Medicine

## 2013-02-28 VITALS — BP 140/82 | HR 60 | Temp 98.3°F | Wt 191.0 lb

## 2013-02-28 DIAGNOSIS — F17200 Nicotine dependence, unspecified, uncomplicated: Secondary | ICD-10-CM

## 2013-02-28 DIAGNOSIS — I10 Essential (primary) hypertension: Secondary | ICD-10-CM

## 2013-02-28 DIAGNOSIS — J309 Allergic rhinitis, unspecified: Secondary | ICD-10-CM

## 2013-02-28 DIAGNOSIS — Z720 Tobacco use: Secondary | ICD-10-CM

## 2013-02-28 DIAGNOSIS — E785 Hyperlipidemia, unspecified: Secondary | ICD-10-CM

## 2013-02-28 LAB — LDL CHOLESTEROL, DIRECT: Direct LDL: 121 mg/dL — ABNORMAL HIGH

## 2013-02-28 MED ORDER — LORATADINE 10 MG PO TABS: 10.0000 mg | ORAL_TABLET | Freq: Every day | ORAL | Status: DC

## 2013-02-28 MED ORDER — TRIAMTERENE-HCTZ 37.5-25 MG PO TABS: 1.0000 | ORAL_TABLET | Freq: Every day | ORAL | Status: DC

## 2013-02-28 MED ORDER — AMLODIPINE BESYLATE 10 MG PO TABS: 10.0000 mg | ORAL_TABLET | Freq: Every day | ORAL | Status: DC

## 2013-02-28 NOTE — Assessment & Plan Note (Signed)
Will check direct LDL today to assess medication.

## 2013-02-28 NOTE — Assessment & Plan Note (Signed)
Interested in quitting, but has not been successful. Failed chantix due to side effects and nicotine replacement. He will think about wellbutrin and let me know.

## 2013-02-28 NOTE — Assessment & Plan Note (Signed)
Okay today. Continue maxide and amlodipine. Follow up in 6 months.

## 2013-02-28 NOTE — Patient Instructions (Signed)
It was nice meet you!  Your blood pressure is 140/82 today. Take all medication as prescribed. Try to get 30 minutes of moderate activity 5 days a week. Eat a low salt diet.  Follow up in 6 months.

## 2013-02-28 NOTE — Progress Notes (Signed)
  Subjective:    Patient ID: Jonathan [name], male    DOB: 10-22-1950, 62 y.o.   MRN: [id number]  HPI [name] is here for f/u chronic issues.  Hypertension Compliant with medication: yes Side effects from medication: no Check BP at home: no  Chest pain: no Palpitations: no Vision changes: no Leg edema: no Dizziness: no  Hyperlipidemia Compliant with medication: yes - started lipitor 10/2012 Side effects from medication: no  Smoking He would like to quit.  Has tried patches in the past without success.  Was started on Chantix, but stopped the medication as it kept him up at night.  I have reviewed and updated the following as appropriate: allergies and current medications SHx: current smoker  Review of Systems See HPI    Objective:   Physical Exam BP 140/82  Pulse 60  Temp(Src) 98.3 F (36.8 C) (Oral)  Wt 191 lb (86.637 kg) Gen: alert, cooperative, NAD HEENT: AT/[place], sclera white, MMM Neck: supple CV: RRR, no murmurs Pulm: CTAB, no wheezes or rales Ext: no edema     Assessment & Plan:

## 2013-04-15 ENCOUNTER — Other Ambulatory Visit: Payer: Self-pay | Admitting: Emergency Medicine

## 2013-04-15 DIAGNOSIS — I10 Essential (primary) hypertension: Secondary | ICD-10-CM

## 2013-04-15 MED ORDER — ASPIRIN EC 81 MG PO TBEC: 81.0000 mg | DELAYED_RELEASE_TABLET | Freq: Every day | ORAL | Status: DC

## 2013-04-15 MED ORDER — ATORVASTATIN CALCIUM 40 MG PO TABS: 40.0000 mg | ORAL_TABLET | Freq: Every day | ORAL | Status: DC

## 2013-04-29 ENCOUNTER — Ambulatory Visit (INDEPENDENT_AMBULATORY_CARE_PROVIDER_SITE_OTHER): Payer: Medicaid Other | Admitting: Emergency Medicine

## 2013-04-29 ENCOUNTER — Encounter: Payer: Self-pay | Admitting: Emergency Medicine

## 2013-04-29 VITALS — BP 155/74 | HR 57 | Ht 67.0 in | Wt 196.4 lb

## 2013-04-29 DIAGNOSIS — L909 Atrophic disorder of skin, unspecified: Secondary | ICD-10-CM

## 2013-04-29 DIAGNOSIS — L918 Other hypertrophic disorders of the skin: Secondary | ICD-10-CM | POA: Insufficient documentation

## 2013-04-29 DIAGNOSIS — L919 Hypertrophic disorder of the skin, unspecified: Secondary | ICD-10-CM

## 2013-04-29 DIAGNOSIS — I10 Essential (primary) hypertension: Secondary | ICD-10-CM

## 2013-04-29 MED ORDER — LISINOPRIL 5 MG PO TABS: 5.0000 mg | ORAL_TABLET | Freq: Every day | ORAL | Status: DC

## 2013-04-29 NOTE — Progress Notes (Signed)
   Subjective:    Patient ID: Jonathan [name], male    DOB: 10-03-1950, 63 y.o.   MRN: [id number]  HPI Jonathan [name] is here for a mole.  He reports a mole on his right inner thigh for the last year.  In the last 6 months it has gotten bigger.  No itching or ulcerations.  Also with another mole on his left neck that is bothersome.  I have reviewed and updated the following as appropriate: allergies and current medications SHx: current smoker   Review of Systems See HPI    Objective:   Physical Exam BP 155/74  Pulse 57  Ht 5\' 7"  (1.702 m)  Wt 196 lb 6.4 oz (89.086 kg)  BMI 30.75 kg/m2 Gen: alert, cooperative, NAD Skin: 1cm broad based skin tag over left clavicle; 1.5cm pedunculated skin tag on right inner thigh      Assessment & Plan:  Procedure Note - Skin tag removal Informed consent was obtained.  The skin tag was cleaned with alcohol.  1cc of 1% lidocaine with epi was used for anesthetic.  Hemostats were applied to base of skin tag for 30 seconds.  Skin tag was removed with scissors.  Minimal blood loss.  Patient tolerated procedure well.  Procedure Note - cryosurgery Lesion was identified and confirmed with patient.  Liquid nitrogen was applied until the lesion stayed white for 30 seconds.  Patient tolerated procedure well.

## 2013-04-29 NOTE — Patient Instructions (Signed)
It was nice to see you!  We removed some skin tags today. The one on your neck will turn dark and peel off over the next 4 weeks.  We may need to refreeze it.  Keep the spot on your leg covered with a bandaid for the next 2-3 days.  You can apply vaseline or an antibiotic ointment for the next 2-3 days.  Start taking lisinopril for your blood pressure.  It is one pill once a day.  Follow up in 2 weeks to recheck your blood pressure and check blood work.

## 2013-04-29 NOTE — Assessment & Plan Note (Signed)
Neck tag frozen. Removed right thigh skin tag. See procedure notes.

## 2013-04-29 NOTE — Assessment & Plan Note (Signed)
Remains elevated. Will start lisinopril 5mg  in addition to amlodipine and maxide. F/u in 2 weeks for recheck and bmp.

## 2013-05-03 ENCOUNTER — Encounter: Payer: Self-pay | Admitting: Family Medicine

## 2013-05-03 ENCOUNTER — Ambulatory Visit (INDEPENDENT_AMBULATORY_CARE_PROVIDER_SITE_OTHER): Payer: Medicaid Other | Admitting: Family Medicine

## 2013-05-03 VITALS — BP 162/92 | HR 56 | Ht 67.0 in | Wt 195.0 lb

## 2013-05-03 DIAGNOSIS — K529 Noninfective gastroenteritis and colitis, unspecified: Secondary | ICD-10-CM

## 2013-05-03 DIAGNOSIS — R197 Diarrhea, unspecified: Secondary | ICD-10-CM

## 2013-05-03 NOTE — Progress Notes (Signed)
   Subjective:    Patient ID: Jonathan [name], male    DOB: [birth year], 63 y.o.   MRN: [id number]  HPI  63 year old M with diarrhea. 3 days duration. Loose bowel movements 10 x yesterday. No blood or mucus. No melena. No nausea or vomiting. No recent travel. No unfiltered water. No uncooked meats.    Review of Systems     Objective:   Physical Exam BP 162/92  Pulse 56  Ht 5\' 7"  (1.702 m)  Wt 195 lb (88.451 kg)  BMI 30.53 kg/m2 Gen: well appearing middle aged male CV: RRR. No murmurs, 2+ radial pulses, < 3 sec cap refill, normal skin turgor Abd: soft, NDNT, hyperactive bowel sounds        Assessment & Plan:  Diarrhea without concerning for serious infection and no signs of dehydration. Encouraged PO water 1 cup q hr. Loperamide daily. Return is worsening symptoms.

## 2013-05-03 NOTE — Patient Instructions (Signed)
Jonathan [name],   It was nice to see you. Please stay well hydrated with water and take immodium for diarrhea. If it gets worse or becomes bloody, then please come back.   Take Care,   Dr. Maricela Bo

## 2013-05-15 ENCOUNTER — Ambulatory Visit: Payer: Medicaid Other | Admitting: Emergency Medicine

## 2013-05-16 ENCOUNTER — Ambulatory Visit: Payer: Medicaid Other | Admitting: Emergency Medicine

## 2013-09-06 ENCOUNTER — Other Ambulatory Visit: Payer: Self-pay | Admitting: Emergency Medicine

## 2013-10-04 ENCOUNTER — Other Ambulatory Visit: Payer: Self-pay | Admitting: Emergency Medicine

## 2014-06-06 ENCOUNTER — Other Ambulatory Visit: Payer: Self-pay | Admitting: Family Medicine

## 2014-06-10 ENCOUNTER — Other Ambulatory Visit: Payer: Self-pay | Admitting: Family Medicine

## 2014-06-11 ENCOUNTER — Telehealth: Payer: Self-pay | Admitting: Family Medicine

## 2014-06-11 NOTE — Telephone Encounter (Signed)
To [facility] red team - please call pt and let him know I have refilled his blood pressure meds but he needs to schedule an appointment to follow up on his BP. Thanks!  Leeanne Rio, MD

## 2014-06-12 NOTE — Telephone Encounter (Signed)
Left message on voicemail with message from MD.

## 2014-07-11 ENCOUNTER — Other Ambulatory Visit: Payer: Self-pay | Admitting: Family Medicine

## 2014-07-13 NOTE — Telephone Encounter (Signed)
Covering inbox for Dr Ardelia Mems. She recently filled amlodipine for patient but requested he set up follow up.  - Please have him set up follow up. Will refill amlodipine for 1 month and cetirizine.  - However, lisinopril last filled 08/2013 for 7 months. Please ask if he has been taking this in the past 3 months - will await answer prior to refill.  Hilton Sinclair, MD

## 2014-07-15 NOTE — Telephone Encounter (Signed)
Tried calling patient, unable to leave message.

## 2014-07-15 NOTE — Telephone Encounter (Signed)
Attempted to call pt, but call went straight to VM and his VM has not been set up. [place], Salome Spotted

## 2014-09-11 ENCOUNTER — Other Ambulatory Visit: Payer: Self-pay | Admitting: Family Medicine

## 2014-10-14 ENCOUNTER — Other Ambulatory Visit: Payer: Self-pay | Admitting: Family Medicine

## 2014-10-15 ENCOUNTER — Other Ambulatory Visit: Payer: Self-pay | Admitting: Family Medicine

## 2014-10-16 ENCOUNTER — Telehealth: Payer: Self-pay | Admitting: Family Medicine

## 2014-10-16 NOTE — Telephone Encounter (Signed)
To [facility] red team - please call pt and let him know I have refilled his meds for 30 days but he needs to schedule an appointment to follow up on his medical problems.   He has not been seen here in 1.5 years and I will NOT refill again until he comes in for an appointment.  Thanks!  Leeanne Rio, MD

## 2014-10-21 NOTE — Telephone Encounter (Signed)
Home number disconnected, left message on mobile voicemail with message from MD.

## 2014-10-21 NOTE — Telephone Encounter (Signed)
Red team, please call pt. Thanks, Leeanne Rio, MD

## 2014-11-19 ENCOUNTER — Other Ambulatory Visit: Payer: Self-pay | Admitting: Family Medicine

## 2014-11-20 ENCOUNTER — Other Ambulatory Visit: Payer: Self-pay | Admitting: Family Medicine

## 2014-12-24 ENCOUNTER — Other Ambulatory Visit: Payer: Self-pay | Admitting: Family Medicine

## 2014-12-31 ENCOUNTER — Ambulatory Visit (INDEPENDENT_AMBULATORY_CARE_PROVIDER_SITE_OTHER): Payer: Self-pay | Admitting: Family Medicine

## 2014-12-31 ENCOUNTER — Telehealth: Payer: Self-pay | Admitting: Family Medicine

## 2014-12-31 ENCOUNTER — Encounter: Payer: Self-pay | Admitting: Family Medicine

## 2014-12-31 VITALS — BP 137/85 | HR 55 | Temp 98.0°F | Wt 199.6 lb

## 2014-12-31 DIAGNOSIS — Z23 Encounter for immunization: Secondary | ICD-10-CM

## 2014-12-31 DIAGNOSIS — Z114 Encounter for screening for human immunodeficiency virus [HIV]: Secondary | ICD-10-CM

## 2014-12-31 DIAGNOSIS — Z1159 Encounter for screening for other viral diseases: Secondary | ICD-10-CM

## 2014-12-31 DIAGNOSIS — Z720 Tobacco use: Secondary | ICD-10-CM

## 2014-12-31 DIAGNOSIS — I10 Essential (primary) hypertension: Secondary | ICD-10-CM

## 2014-12-31 DIAGNOSIS — E785 Hyperlipidemia, unspecified: Secondary | ICD-10-CM

## 2014-12-31 NOTE — Progress Notes (Signed)
Patient ID: Jonathan [name], male   DOB: 1950-07-25, 64 y.o.   MRN: [id number]  HPI:  Pt presents today for f/u of HTN. Has not been seen at our clinic since January 2015, so this is my first time meeting him. His wife Helene Kelp is also my patient, and I know her well.  HTN - currently takin amlodipine 10mg  daily, triamterene-HCTZ 37.5-25 mg daily , lisinopril 5mg  daily. Tolerating these medications. Denies CP, SOB, swelling. Does not check BP at home.  Smoking hx - not interested in quitting. Has smoked for 50 years. One pack lasts him 1.5 days currently. Interested in [place] lung cancer screening.  Health maintenance: has never had colonoscopy. Would like flu shot. Willing to get HIV & hep C screening.  ROS: See HPI.  [place]: hx HTN, bradycardia, tobacco abuse, hx of occupational asbestos exposure, OSA  PHYSICAL EXAM: BP 137/85 mmHg  Pulse 55  Temp(Src) 98 F (36.7 C)  Wt 199 lb 9.6 oz (90.538 kg) Gen: NAD, pleasant, cooperative HEENT: NCAT Heart: RRR no murmur Lungs: CTAB NWOB Neuro: grossly nonfocal speech normal Ext: No appreciable lower extremity edema bilaterally   ASSESSMENT/PLAN:  Health maintenance:  -given colonoscopy handout -given handout on shingles vaccine -flu shot given today -low dose CT screening ordered given smoking hx -check hep C and HIV with labs for routine screening  HTN (hypertension) Well controlled. Continue current regimen.  Return for labs: lipids, CMET  Tobacco abuse Encouraged cessation Schedule low dose noncontrast CT chest for screening  HLD (hyperlipidemia) Return for CMET & lipids to assess control   FOLLOW UP: F/u in 3 months for HTN Return for lab visit  [place] J. Ardelia Mems, [place]

## 2014-12-31 NOTE — Telephone Encounter (Signed)
LMOVM. Please advise patient that CT scan to screen for lung cancer has been scheduled for Monday, 01/12/15 at 2:00 PM at [facility] 1st Floor Radiology Dept. Please arrive at 1:45 PM

## 2014-12-31 NOTE — Patient Instructions (Signed)
Flu shot today Getting CT scan of chest to screen for lung cancer See handouts on colonoscopy & shingles vaccines On your way out, schedule an appointment one morning to come back for fasting labs. Do not eat or drink anything other than water the morning of your lab appointment until after your labs are drawn.   Follow up in 3 months for blood pressure, sooner if issues  Be well, Dr. Ardelia Mems

## 2015-01-01 NOTE — Telephone Encounter (Signed)
Called patient again.  No answer.

## 2015-01-03 NOTE — Assessment & Plan Note (Signed)
Return for CMET & lipids to assess control

## 2015-01-03 NOTE — Assessment & Plan Note (Signed)
Encouraged cessation Schedule low dose noncontrast CT chest for screening

## 2015-01-03 NOTE — Assessment & Plan Note (Signed)
Well controlled. Continue current regimen.  Return for labs: lipids, CMET

## 2015-01-12 ENCOUNTER — Ambulatory Visit (HOSPITAL_COMMUNITY): Payer: Medicaid Other

## 2015-01-22 NOTE — Telephone Encounter (Signed)
Phone number left is no longer patient ph number.   Never got the VM.  Need to resched the CT appt at [facility].  Please call patient with new information to:  [phone]

## 2015-01-23 NOTE — Telephone Encounter (Signed)
Looks like Radiology attempted to call & canceled patient's appt due to scanner being down.  I called patient on accurate phone number and LVM for patient to call Radiology directly to schedule his appt. Their number is [phone].

## 2015-03-06 ENCOUNTER — Other Ambulatory Visit: Payer: Self-pay | Admitting: Family Medicine

## 2015-04-02 ENCOUNTER — Other Ambulatory Visit: Payer: Self-pay | Admitting: Family Medicine

## 2015-05-05 ENCOUNTER — Other Ambulatory Visit: Payer: Self-pay | Admitting: Family Medicine

## 2015-05-18 ENCOUNTER — Encounter: Payer: Self-pay | Admitting: Family Medicine

## 2015-05-18 ENCOUNTER — Ambulatory Visit (INDEPENDENT_AMBULATORY_CARE_PROVIDER_SITE_OTHER): Payer: Commercial Managed Care - HMO | Admitting: Family Medicine

## 2015-05-18 VITALS — BP 163/73 | HR 58 | Temp 98.4°F | Ht 67.0 in | Wt 197.7 lb

## 2015-05-18 DIAGNOSIS — J069 Acute upper respiratory infection, unspecified: Secondary | ICD-10-CM

## 2015-05-18 DIAGNOSIS — B9789 Other viral agents as the cause of diseases classified elsewhere: Principal | ICD-10-CM

## 2015-05-18 NOTE — Patient Instructions (Signed)
Likely this is a cold virus Use mucinex Nasal saline spray Follow up if not better later this week Sooner if fevers, unable to eat/drink, etc  Be well, Dr. Ardelia Mems   Upper Respiratory Infection, Adult Most upper respiratory infections (URIs) are a viral infection of the air passages leading to the lungs. A URI affects the nose, throat, and upper air passages. The most common type of URI is nasopharyngitis and is typically referred to as "the common cold." URIs run their course and usually go away on their own. Most of the time, a URI does not require medical attention, but sometimes a bacterial infection in the upper airways can follow a viral infection. This is called a secondary infection. Sinus and middle ear infections are common types of secondary upper respiratory infections. Bacterial pneumonia can also complicate a URI. A URI can worsen asthma and chronic obstructive pulmonary disease (COPD). Sometimes, these complications can require emergency medical care and may be life threatening.  CAUSES Almost all URIs are caused by viruses. A virus is a type of germ and can spread from one person to another.  RISKS FACTORS You may be at risk for a URI if:   You smoke.   You have chronic heart or lung disease.  You have a weakened defense (immune) system.   You are very young or very old.   You have nasal allergies or asthma.  You work in crowded or poorly ventilated areas.  You work in health care facilities or schools. SIGNS AND SYMPTOMS  Symptoms typically develop 2-3 days after you come in contact with a cold virus. Most viral URIs last 7-10 days. However, viral URIs from the influenza virus (flu virus) can last 14-18 days and are typically more severe. Symptoms may include:   Runny or stuffy (congested) nose.   Sneezing.   Cough.   Sore throat.   Headache.   Fatigue.   Fever.   Loss of appetite.   Pain in your forehead, behind your eyes, and over your  cheekbones (sinus pain).  Muscle aches.  DIAGNOSIS  Your health care provider may diagnose a URI by:  Physical exam.  Tests to check that your symptoms are not due to another condition such as:  Strep throat.  Sinusitis.  Pneumonia.  Asthma. TREATMENT  A URI goes away on its own with time. It cannot be cured with medicines, but medicines may be prescribed or recommended to relieve symptoms. Medicines may help:  Reduce your fever.  Reduce your cough.  Relieve nasal congestion. HOME CARE INSTRUCTIONS   Take medicines only as directed by your health care provider.   Gargle warm saltwater or take cough drops to comfort your throat as directed by your health care provider.  Use a warm mist humidifier or inhale steam from a shower to increase air moisture. This may make it easier to breathe.  Drink enough fluid to keep your urine clear or pale yellow.   Eat soups and other clear broths and maintain good nutrition.   Rest as needed.   Return to work when your temperature has returned to normal or as your health care provider advises. You may need to stay home longer to avoid infecting others. You can also use a face mask and careful hand washing to prevent spread of the virus.  Increase the usage of your inhaler if you have asthma.   Do not use any tobacco products, including cigarettes, chewing tobacco, or electronic cigarettes. If you need help quitting,  ask your health care provider. PREVENTION  The best way to protect yourself from getting a cold is to practice good hygiene.   Avoid oral or hand contact with people with cold symptoms.   Wash your hands often if contact occurs.  There is no clear evidence that vitamin C, vitamin E, echinacea, or exercise reduces the chance of developing a cold. However, it is always recommended to get plenty of rest, exercise, and practice good nutrition.  SEEK MEDICAL CARE IF:   You are getting worse rather than better.    Your symptoms are not controlled by medicine.   You have chills.  You have worsening shortness of breath.  You have brown or red mucus.  You have yellow or brown nasal discharge.  You have pain in your face, especially when you bend forward.  You have a fever.  You have swollen neck glands.  You have pain while swallowing.  You have white areas in the back of your throat. SEEK IMMEDIATE MEDICAL CARE IF:   You have severe or persistent:  Headache.  Ear pain.  Sinus pain.  Chest pain.  You have chronic lung disease and any of the following:  Wheezing.  Prolonged cough.  Coughing up blood.  A change in your usual mucus.  You have a stiff neck.  You have changes in your:  Vision.  Hearing.  Thinking.  Mood. MAKE SURE YOU:   Understand these instructions.  Will watch your condition.  Will get help right away if you are not doing well or get worse.   This information is not intended to replace advice given to you by your health care provider. Make sure you discuss any questions you have with your health care provider.   Document Released: 10/05/2000 Document Revised: 08/26/2014 Document Reviewed: 07/17/2013 Elsevier Interactive Patient Education [employer].

## 2015-05-18 NOTE — Progress Notes (Signed)
Date of Visit: 05/18/2015   HPI:  Patient presents for a same day appointment to discuss cold symptoms. has had symptoms for about 4 days. Feels congested in face. Also cough. No fever. Eating and drinking well. Stooling and urinating normally. Blowing nose. Has tried mucinex and nyquil without significant relief. Has pain in back of neck muscles.  ROS: See HPI  [place]: history of hypertension, bradycardia, tobacco abuse, osa, hyperlipidemia   PHYSICAL EXAM: BP 163/73 mmHg  Pulse 58  Temp(Src) 98.4 F (36.9 C) (Oral)  Ht 5\' 7"  (1.702 m)  Wt 197 lb 11.2 oz (89.676 kg)  BMI 30.96 kg/m2 Gen: NAD, pleasant, cooperative HEENT: normocephalic, atraumatic. moist mucous membranes. oropharynx clear. TMs normal. No anterior cervical LAD. Full ROM of neck, no meningeal signs. Nasal congestion present Heart: regular rate and rhythm no murmur Lungs: clear to auscultation bilaterally, normal work of breathing  Abdomen: soft nontender to palpation  Neuro: grossly nonfocal, speech normal, alert  ASSESSMENT/PLAN:  1. URI symptoms - appears viral. No findings suggestive of bacterial superinfection. Recommend supportive care with mucinex, nasal saline spray. Follow up if not improved later this week. Discussed return precautions.  FOLLOW UP: Follow up as needed if symptoms worsen or fail to improve.    [place]. Ardelia Mems, [place]

## 2015-07-01 ENCOUNTER — Other Ambulatory Visit: Payer: Self-pay | Admitting: Family Medicine

## 2015-07-03 ENCOUNTER — Telehealth: Payer: Self-pay | Admitting: Family Medicine

## 2015-07-03 NOTE — Telephone Encounter (Signed)
Red team, Please call patient & let him know that I've sent in 30 days of his blood pressure medications but I will NOT be able to safely refill them again until he comes in for a lab appointment. We need to monitor his kidney function on these medications.  Thanks, Leeanne Rio, MD

## 2015-07-06 NOTE — Telephone Encounter (Signed)
Left message with pt wife for him to call us back. Shareese Macha Kennon Holter, CMA

## 2015-07-08 NOTE — Telephone Encounter (Signed)
Spoke with patient wife, she will inform patient to call back and schedule a lab appointment.

## 2015-12-01 ENCOUNTER — Ambulatory Visit (INDEPENDENT_AMBULATORY_CARE_PROVIDER_SITE_OTHER): Payer: Commercial Managed Care - HMO | Admitting: Student

## 2015-12-01 ENCOUNTER — Encounter: Payer: Self-pay | Admitting: Student

## 2015-12-01 VITALS — BP 174/78 | HR 67 | Temp 98.5°F | Ht 67.0 in | Wt 206.3 lb

## 2015-12-01 DIAGNOSIS — I10 Essential (primary) hypertension: Secondary | ICD-10-CM | POA: Diagnosis not present

## 2015-12-01 DIAGNOSIS — K439 Ventral hernia without obstruction or gangrene: Secondary | ICD-10-CM | POA: Diagnosis not present

## 2015-12-01 NOTE — Assessment & Plan Note (Signed)
Looks like epigastric hernia versus lymphadenopathy or lipoma.  Ordered ambulatory referral to general surgery given severity of pain.

## 2015-12-01 NOTE — Assessment & Plan Note (Signed)
Noted blood pressure is elevated to 174/78. He says he did not take his blood pressure medication this morning which could be the reason why his blood pressure is elevated. He denies chest pain or shortness of breath.

## 2015-12-01 NOTE — Patient Instructions (Signed)
It was great seeing you today! We have addressed the following issues today  1. Epigastric hernia: It is hard to tell for sure if this is hernia. This could also be just a lymph node or fat tissue. I have ordered a referral to Gen. Surgery. Someone will call you about this referral in the next few days. Meanwhile, let us know if you have chest pain, trouble breathing or any other serious symptoms.     If we did any lab work today, and the results require attention, either me or my nurse will get in touch with you. If everything is normal, you will get a letter in mail. If you don't hear from [place] in two weeks, please give [place] a call. Otherwise, I look forward to talking with you again at our next visit. If you have any questions or concerns before then, please call the clinic at [phone].  Please bring all your medications to every doctors visit   Sign up for My Chart to have easy access to your labs results, and communication with your Primary care physician.    Please check-out at the front desk before leaving the clinic.   Take Care,

## 2015-12-01 NOTE — Progress Notes (Signed)
   Subjective:    Patient ID: Jonathan [name] is a 65 y.o. old male.  CC: Hernia pain  HPI #Epigastric pain: this has been going on for about a week. He says he has a lump over epigastric area for about a year. He never had a pain there before. Pain is dull. Mostly after bending over and picking up something such as grandkids. No radiation to eat. No association with meals or supine position. He did Architect work in the past. Denies nausea, vomiting or diarrhea. Denies chest pain or shortness of breath with exertion.   PMH: reviewed SH:   -Smoker: about a pack a day. Denies drinking.   Review of Systems Per HPI Objective:   Vitals:   12/01/15 1104  BP: (!) 174/78  Pulse: 67  Temp: 98.5 F (36.9 C)  TempSrc: Oral  Weight: 206 lb 4.8 oz (93.6 kg)  Height: 5\' 7"  (1.702 m)    GEN: appears well, no apparent distress. CVS: RRR, normal s1 and s2 RESP: no increased work of breathing GI: soft, non-tender,non-distended, +BS. Noted a small lump about 1-2 cm in diameter just below the xiphoid process. This is not tender to palpation. Mildly mobile. Not totally reducible diminished in size when patient lies down supine.  NEURO: alert and oriented appropriately, no gross defecits  PSYCH: appropriate mood and affect   Assessment & Plan:  Epigastric hernia Looks like epigastric hernia versus lymphadenopathy or lipoma.  Ordered ambulatory referral to general surgery given severity of pain.   HTN (hypertension) Noted blood pressure is elevated to 174/78. He says he did not take his blood pressure medication this morning which could be the reason why his blood pressure is elevated. He denies chest pain or shortness of breath.

## 2015-12-27 ENCOUNTER — Encounter (HOSPITAL_COMMUNITY): Payer: Self-pay | Admitting: Family Medicine

## 2015-12-27 ENCOUNTER — Ambulatory Visit (HOSPITAL_COMMUNITY)
Admission: EM | Admit: 2015-12-27 | Discharge: 2015-12-27 | Disposition: A | Discharge status: Home / Self Care | Payer: Medicare Other | Attending: Radiology | Admitting: Radiology

## 2015-12-27 DIAGNOSIS — R0981 Nasal congestion: Secondary | ICD-10-CM

## 2015-12-27 MED ORDER — FLUTICASONE PROPIONATE 50 MCG/ACT NA SUSP: 1.0000 | Freq: Every day | NASAL | 2 refills | Status: DC

## 2015-12-27 MED ORDER — PREDNISONE 20 MG PO TABS: 40.0000 mg | ORAL_TABLET | Freq: Every day | ORAL | 0 refills | Status: AC

## 2015-12-27 NOTE — Discharge Instructions (Signed)
Continue to push fluids

## 2015-12-27 NOTE — ED Triage Notes (Signed)
Pt here for sinus congestion and cough. sts he has been very sleepy. sts started 2 weeks ago.

## 2015-12-27 NOTE — ED Provider Notes (Signed)
CSN: [id number]     Arrival date & time 12/27/15  1705 History   First MD Initiated Contact with Patient 12/27/15 1729     Chief Complaint  Patient presents with  . Cough  . Nasal Congestion   (Consider location/radiation/quality/duration/timing/severity/associated sxs/prior Treatment) 65 y.o. male history of HTN. COPD (per pateint) presents with nasal congestion and nonproductive cough  X 2 weeks Condition is acute in nature. Condition is made better by nothing. Condition is made worse by nohting. Patient denies any treatments prior to there arrival at this facility. Patient denies any fevers, sinus issues or shob.        Past Medical History:  Diagnosis Date  . HTN (hypertension)    Past Surgical History:  Procedure Laterality Date  . none     Family History  Problem Relation Age of Onset  . Cancer Mother   . Heart disease Father    Social History  Substance Use Topics  . Smoking status: Current Every Day Smoker    Packs/day: 0.50    Years: 43.00    Types: Cigarettes  . Smokeless tobacco: Never Used     Comment: pt is trying to aggressively cut back on # cigs smoked daily.   . Alcohol use No    Review of Systems  Constitutional: Negative.   HENT: Positive for congestion (nasal). Negative for ear pain and sinus pressure.   Respiratory: Positive for cough. Negative for chest tightness and shortness of breath.     Allergies  Review of patient's allergies indicates no known allergies.  Home Medications   Prior to Admission medications   Medication Sig Start Date End Date Taking? Authorizing Provider  amLODipine (NORVASC) 10 MG tablet TAKE ONE TABLET BY MOUTH DAILY. 07/03/15   Leeanne Rio, MD  aspirin EC 81 MG tablet Take 1 tablet (81 mg total) by mouth daily. 04/15/13 12/31/15  Melony Overly, MD  atorvastatin (LIPITOR) 40 MG tablet TAKE ONE TABLET BY MOUTH DAILY. 07/03/15   Leeanne Rio, MD  Fexofenadine HCl (ALLEGRA PO) Take by mouth.    Historical  Provider, MD  fluticasone (FLONASE) 50 MCG/ACT nasal spray Place 1 spray into both nostrils daily. 12/27/15   Jacqualine Mau, NP  lisinopril (PRINIVIL,ZESTRIL) 5 MG tablet TAKE ONE TABLET BY MOUTH DAILY. 07/03/15   Leeanne Rio, MD  loratadine (CLARITIN) 10 MG tablet Take 1 tablet (10 mg total) by mouth daily. 04/02/15   Leeanne Rio, MD  predniSONE (DELTASONE) 20 MG tablet Take 2 tablets (40 mg total) by mouth daily with breakfast. 12/27/15 01/01/16  Jacqualine Mau, NP  triamterene-hydrochlorothiazide (MAXZIDE-25) 37.5-25 MG tablet TAKE 1 TABLET BY MOUTH DAILY. 07/03/15   Leeanne Rio, MD  VENTOLIN HFA 108 (90 Base) MCG/ACT inhaler INHALE 2 PUFFS BY MOUTH EVERY 4 HOURS AS NEEDED FOR WHEEZING OR SHORTNESS OF BREATH. 05/08/15   Leeanne Rio, MD   Meds Ordered and Administered this Visit  Medications - No data to display  BP 176/72   Pulse 66   Temp 98.9 F (37.2 C)   Resp 20   SpO2 98%  No data found.   Physical Exam  Constitutional: He is oriented to person, place, and time.  HENT:  Nasal discharge noted to bilateral nares.   Cardiovascular: Normal rate and regular rhythm.   Pulmonary/Chest: Effort normal and breath sounds normal.  Neurological: He is alert and oriented to person, place, and time.  Skin: Skin is warm and dry.  Urgent Care Course   Clinical Course    Procedures (including critical care time)  Labs Review Labs Reviewed - No data to display  Imaging Review No results found.   Visual Acuity Review  Right Eye Distance:   Left Eye Distance:   Bilateral Distance:    Right Eye Near:   Left Eye Near:    Bilateral Near:         MDM   1. Nasal congestion       Jacqualine Mau, NP 12/27/15 2018

## 2016-01-04 ENCOUNTER — Ambulatory Visit: Payer: Self-pay | Admitting: Family Medicine

## 2016-03-28 ENCOUNTER — Encounter: Payer: Self-pay | Admitting: Family Medicine

## 2016-03-28 ENCOUNTER — Ambulatory Visit (INDEPENDENT_AMBULATORY_CARE_PROVIDER_SITE_OTHER): Payer: Medicare Other | Admitting: Family Medicine

## 2016-03-28 VITALS — BP 122/54 | HR 61 | Temp 98.6°F | Ht 67.0 in | Wt 207.6 lb

## 2016-03-28 DIAGNOSIS — Z122 Encounter for screening for malignant neoplasm of respiratory organs: Secondary | ICD-10-CM | POA: Diagnosis not present

## 2016-03-28 DIAGNOSIS — E119 Type 2 diabetes mellitus without complications: Secondary | ICD-10-CM | POA: Diagnosis not present

## 2016-03-28 DIAGNOSIS — R7303 Prediabetes: Secondary | ICD-10-CM | POA: Diagnosis not present

## 2016-03-28 DIAGNOSIS — Z23 Encounter for immunization: Secondary | ICD-10-CM

## 2016-03-28 DIAGNOSIS — Z720 Tobacco use: Secondary | ICD-10-CM

## 2016-03-28 DIAGNOSIS — Z114 Encounter for screening for human immunodeficiency virus [HIV]: Secondary | ICD-10-CM

## 2016-03-28 DIAGNOSIS — I10 Essential (primary) hypertension: Secondary | ICD-10-CM

## 2016-03-28 DIAGNOSIS — Z1159 Encounter for screening for other viral diseases: Secondary | ICD-10-CM

## 2016-03-28 DIAGNOSIS — E785 Hyperlipidemia, unspecified: Secondary | ICD-10-CM

## 2016-03-28 LAB — POCT GLYCOSYLATED HEMOGLOBIN (HGB A1C): HEMOGLOBIN A1C: 6.5

## 2016-03-28 MED ORDER — ZOSTER VACCINE LIVE 19400 UNT/0.65ML ~~LOC~~ SUSR: 0.6500 mL | Freq: Once | SUBCUTANEOUS | 0 refills | Status: AC

## 2016-03-28 NOTE — Patient Instructions (Signed)
It was great to see you again today!  For diabetes: - referring to diabetic nutritionist - call eye doctor about getting another eye exam  Health maintenance: - scheduling CT scan of your lungs - see colonoscopy handout - see handout & prescription for shingles vaccine  On your way out, schedule an appointment one morning to come back for fasting labs. Do not eat or drink anything other than water the morning of your lab appointment until after your labs are drawn.   Schedule an annual wellness visit with Adonis Brook or Ander Purpura. This is a longer visit to focus on your wellness goals and how to keep you healthy. It is free.   Follow up with me in 3 months  Be well, Dr. Ardelia Mems

## 2016-03-28 NOTE — Progress Notes (Signed)
Date of Visit: 03/28/2016   HPI:  Patient presents for routine follow up.  Hypertension - currently taking amlodipine 10mg  daily, lisinopril 5mg  daily, triamterene-HCTZ 37.5-25mg  daily. Tolerating well. Denies chest pain, shortness of breath, lower extremity edema. At home sometimes blood pressure runs high (180/135 is highest he knows of)  Smoking - currently smokes 1/2 ppd. Previously was smoking 1 ppd. Amenable to CT lung cancer screening.  Diabetes - new diagnosis today. A1c was obtained due to history of prediabetes. Reports having eye exam done a few months ago at [facility] but did not have dx of diabetes at that time. Would prefer to work on lifestyle management rather than start medications today.  Hyperlipidemia - taking lipitor, tolerating well. nonfasting today.  Prev seen here at [facility] for possible hernia, reports he saw general surgery & they told him it was a lipoma, nothing to do about it, no surgeries planned.   ROS: See HPI.  [place]: history of hypertension, hernia, tobacco abuse, hyperlipidemia, allergic rhinitis, OSA  PHYSICAL EXAM: BP (!) 122/54 (BP Location: Right Arm, Patient Position: Sitting, Cuff Size: Normal) Comment: manual  Pulse 61   Temp 98.6 F (37 C) (Oral)   Ht 5\' 7"  (1.702 m)   Wt 207 lb 9.6 oz (94.2 kg)   BMI 32.51 kg/m  Gen: NAD, pleasant, cooperative HEENT: normocephalic, atraumatic, moist mucous membranes  Heart: regular rate and rhythm, no murmur Lungs: clear to auscultation bilaterally, normal work of breathing  Neuro: alert grossly nonfocal speech normal Ext: No appreciable lower extremity edema bilaterally  Diabetic foot exam: 2+ DP pulses bilat, normal monofilament testing bilaterally. No lesions or significant calluses.   ASSESSMENT/PLAN:  Health maintenance:  -draw HIV & hep C at future lab visit -normal foot exam today -needs diabetes eye exam (new dx diabetes)  -flu shot given today -rx for zostavax given  today -colonoscopy handout given to patient today -CT lung cancer screening scheduled -instructed to schedule Annual Wellness Visit   Tobacco abuse Again encouraged cessation. Has cut back some. Scheduled low dose noncontrast CT chest for lung cancer screening purposes (he didn't get this last year)  HTN (hypertension) Well controlled. Continue current regimen. Return for fasting labs - stressed importance of this with patient.   HLD (hyperlipidemia) Tolerating statin. Needs fasting lipids - instructed to return for lab visit. nonfasting today.  Type 2 diabetes mellitus ([place]) New dx today. A1c 6.5. Discussed diagnosis with patient. He prefers lifestyle management at this time. Would like to meet with diabetic nutritionist. Referral done.  Cardiac: on statin, return for lipids. Consider asa at future visit Renal: on ACE. Check renal function at lab visit. Eye: encouraged to get new eye exam in light of dx with DM Foot: exam done today, normal Immunizations: given rx for zostavax today. Flu given today.   FOLLOW UP: Follow up in 3 mos for above issues Return for lab visit  [place] J. Ardelia Mems, [name]

## 2016-03-28 NOTE — Progress Notes (Signed)
error 

## 2016-03-31 ENCOUNTER — Ambulatory Visit (HOSPITAL_COMMUNITY): Payer: Medicare Other

## 2016-04-01 ENCOUNTER — Telehealth: Payer: Self-pay | Admitting: Family Medicine

## 2016-04-01 DIAGNOSIS — E119 Type 2 diabetes mellitus without complications: Secondary | ICD-10-CM | POA: Insufficient documentation

## 2016-04-01 NOTE — Telephone Encounter (Signed)
Realized today that patient listed as no-show for his CT scan appointment which was scheduled yesterday. This is my fault - his wife told me they would not be able to make that appointment and I told her we would call them to reschedule it.  Red team, please call & reschedule CT scan for morning appointment, and call patient with new appointment time. CT is not urgent to complete - can be any time in next several weeks. Thanks.  Leeanne Rio, MD

## 2016-04-01 NOTE — Assessment & Plan Note (Signed)
Again encouraged cessation. Has cut back some. Scheduled low dose noncontrast CT chest for lung cancer screening purposes (he didn't get this last year)

## 2016-04-01 NOTE — Assessment & Plan Note (Signed)
Tolerating statin. Needs fasting lipids - instructed to return for lab visit. nonfasting today.

## 2016-04-01 NOTE — Assessment & Plan Note (Addendum)
New dx today. A1c 6.5. Discussed diagnosis with patient. He prefers lifestyle management at this time. Would like to meet with diabetic nutritionist. Referral done.  Cardiac: on statin, return for lipids. Consider asa at future visit Renal: on ACE. Check renal function at lab visit. Eye: encouraged to get new eye exam in light of dx with DM Foot: exam done today, normal Immunizations: given rx for zostavax today. Flu given today.

## 2016-04-01 NOTE — Assessment & Plan Note (Signed)
Well controlled. Continue current regimen. Return for fasting labs - stressed importance of this with patient.

## 2016-04-03 ENCOUNTER — Encounter (HOSPITAL_COMMUNITY): Payer: Self-pay | Admitting: Emergency Medicine

## 2016-04-03 ENCOUNTER — Emergency Department (HOSPITAL_COMMUNITY): Payer: Medicare Other

## 2016-04-03 ENCOUNTER — Telehealth: Payer: Self-pay | Admitting: Internal Medicine

## 2016-04-03 ENCOUNTER — Inpatient Hospital Stay (HOSPITAL_COMMUNITY)
Admission: EM | Admit: 2016-04-03 | Discharge: 2016-04-05 | DRG: 066 | Disposition: A | Discharge status: Home / Self Care | Payer: Medicare Other | Attending: Family Medicine | Admitting: Family Medicine

## 2016-04-03 DIAGNOSIS — R001 Bradycardia, unspecified: Secondary | ICD-10-CM | POA: Diagnosis not present

## 2016-04-03 DIAGNOSIS — G4733 Obstructive sleep apnea (adult) (pediatric): Secondary | ICD-10-CM | POA: Diagnosis present

## 2016-04-03 DIAGNOSIS — E785 Hyperlipidemia, unspecified: Secondary | ICD-10-CM | POA: Diagnosis present

## 2016-04-03 DIAGNOSIS — Z7982 Long term (current) use of aspirin: Secondary | ICD-10-CM | POA: Diagnosis not present

## 2016-04-03 DIAGNOSIS — Z8673 Personal history of transient ischemic attack (TIA), and cerebral infarction without residual deficits: Secondary | ICD-10-CM | POA: Diagnosis present

## 2016-04-03 DIAGNOSIS — R471 Dysarthria and anarthria: Secondary | ICD-10-CM | POA: Diagnosis present

## 2016-04-03 DIAGNOSIS — R29702 NIHSS score 2: Secondary | ICD-10-CM | POA: Diagnosis not present

## 2016-04-03 DIAGNOSIS — I6789 Other cerebrovascular disease: Secondary | ICD-10-CM | POA: Diagnosis present

## 2016-04-03 DIAGNOSIS — E118 Type 2 diabetes mellitus with unspecified complications: Secondary | ICD-10-CM | POA: Diagnosis not present

## 2016-04-03 DIAGNOSIS — R4701 Aphasia: Secondary | ICD-10-CM | POA: Diagnosis present

## 2016-04-03 DIAGNOSIS — Z720 Tobacco use: Secondary | ICD-10-CM | POA: Diagnosis not present

## 2016-04-03 DIAGNOSIS — J309 Allergic rhinitis, unspecified: Secondary | ICD-10-CM | POA: Diagnosis present

## 2016-04-03 DIAGNOSIS — E669 Obesity, unspecified: Secondary | ICD-10-CM | POA: Diagnosis present

## 2016-04-03 DIAGNOSIS — I10 Essential (primary) hypertension: Secondary | ICD-10-CM | POA: Diagnosis present

## 2016-04-03 DIAGNOSIS — E7800 Pure hypercholesterolemia, unspecified: Secondary | ICD-10-CM

## 2016-04-03 DIAGNOSIS — Z6832 Body mass index (BMI) 32.0-32.9, adult: Secondary | ICD-10-CM

## 2016-04-03 DIAGNOSIS — I639 Cerebral infarction, unspecified: Secondary | ICD-10-CM | POA: Diagnosis not present

## 2016-04-03 DIAGNOSIS — H9190 Unspecified hearing loss, unspecified ear: Secondary | ICD-10-CM | POA: Diagnosis not present

## 2016-04-03 DIAGNOSIS — Z7951 Long term (current) use of inhaled steroids: Secondary | ICD-10-CM

## 2016-04-03 DIAGNOSIS — F17210 Nicotine dependence, cigarettes, uncomplicated: Secondary | ICD-10-CM | POA: Diagnosis present

## 2016-04-03 DIAGNOSIS — F17200 Nicotine dependence, unspecified, uncomplicated: Secondary | ICD-10-CM | POA: Diagnosis not present

## 2016-04-03 DIAGNOSIS — R4781 Slurred speech: Secondary | ICD-10-CM | POA: Diagnosis not present

## 2016-04-03 LAB — COMPREHENSIVE METABOLIC PANEL
ALBUMIN: 4.1 g/dL (ref 3.5–5.0)
ALK PHOS: 95 U/L (ref 38–126)
ALT: 29 U/L (ref 17–63)
AST: 25 U/L (ref 15–41)
Anion gap: 9 (ref 5–15)
BUN: 12 mg/dL (ref 6–20)
CALCIUM: 8.9 mg/dL (ref 8.9–10.3)
CHLORIDE: 105 mmol/L (ref 101–111)
CO2: 22 mmol/L (ref 22–32)
CREATININE: 1.06 mg/dL (ref 0.61–1.24)
GFR calc Af Amer: >60 mL/min (ref >60)
GFR calc non Af Amer: >60 mL/min (ref >60)
GLUCOSE: 204 mg/dL — AB (ref 65–99)
Potassium: 4.3 mmol/L (ref 3.5–5.1)
SODIUM: 136 mmol/L (ref 135–145)
Total Bilirubin: <0.1 mg/dL — ABNORMAL LOW (ref 0.3–1.2)
Total Protein: 6.9 g/dL (ref 6.5–8.1)

## 2016-04-03 LAB — CBC
HCT: 44.1 % (ref 39.0–52.0)
Hemoglobin: 14.8 g/dL (ref 13.0–17.0)
MCH: 30.1 pg (ref 26.0–34.0)
MCHC: 33.6 g/dL (ref 30.0–36.0)
MCV: 89.8 fL (ref 78.0–100.0)
Platelets: 240 10*3/uL (ref 150–400)
RBC: 4.91 MIL/uL (ref 4.22–5.81)
RDW: 13.6 % (ref 11.5–15.5)
WBC: 7.5 10*3/uL (ref 4.0–10.5)

## 2016-04-03 LAB — I-STAT CHEM 8, ED
BUN: 14 mg/dL (ref 6–20)
CREATININE: 1.1 mg/dL (ref 0.61–1.24)
Calcium, Ion: 1.06 mmol/L — ABNORMAL LOW (ref 1.15–1.40)
Chloride: 104 mmol/L (ref 101–111)
Glucose, Bld: 205 mg/dL — ABNORMAL HIGH (ref 65–99)
HEMATOCRIT: 44 % (ref 39.0–52.0)
HEMOGLOBIN: 15 g/dL (ref 13.0–17.0)
POTASSIUM: 4.3 mmol/L (ref 3.5–5.1)
SODIUM: 138 mmol/L (ref 135–145)
TCO2: 22 mmol/L (ref 0–100)

## 2016-04-03 LAB — PROTIME-INR
INR: 0.91
PROTHROMBIN TIME: 12.3 s (ref 11.4–15.2)

## 2016-04-03 LAB — MAGNESIUM: Magnesium: 2.1 mg/dL (ref 1.7–2.4)

## 2016-04-03 LAB — I-STAT TROPONIN, ED: Troponin i, poc: 0 ng/mL (ref 0.00–0.08)

## 2016-04-03 LAB — DIFFERENTIAL
BASOS ABS: 0.1 10*3/uL (ref 0.0–0.1)
BASOS PCT: 1 %
Eosinophils Absolute: 0.6 10*3/uL (ref 0.0–0.7)
Eosinophils Relative: 8 %
LYMPHS PCT: 26 %
Lymphs Abs: 1.9 10*3/uL (ref 0.7–4.0)
Monocytes Absolute: 0.5 10*3/uL (ref 0.1–1.0)
Monocytes Relative: 7 %
NEUTROS ABS: 4.4 10*3/uL (ref 1.7–7.7)
NEUTROS PCT: 58 %

## 2016-04-03 LAB — CBG MONITORING, ED: GLUCOSE-CAPILLARY: 200 mg/dL — AB (ref 65–99)

## 2016-04-03 LAB — AMMONIA: AMMONIA: 41 umol/L — AB (ref 9–35)

## 2016-04-03 LAB — APTT: APTT: 30 s (ref 24–36)

## 2016-04-03 NOTE — ED Provider Notes (Signed)
[facility] DEPT Provider Note   CSN: [id number] Arrival date & time: 04/03/16  1740     History   Chief Complaint Chief Complaint  Patient presents with  . Aphasia    HPI [name] is a 65 y.o. male.  65 yo M with a chief complaint of slurred speech. Patient noticed that this started yesterday. Since then patient has had continuous difficulty with his speech. He denies any difficulty with naming denies any difficulty with coordination muscle weakness facial droop. He denies any medication changes denies fever cough congestion vomiting diarrhea. Denies alcohol use. Denies history of prior stroke or mini stroke. Has a history of hypertension hyperlipidemia, tobacco use. Patient states that he was also recently diagnosed as prediabetic.   The history is provided by the patient and the spouse.  Cerebrovascular Accident  This is a new problem. The current episode started yesterday. The problem occurs constantly. The problem has not changed since onset.Pertinent negatives include no chest pain, no abdominal pain, no headaches and no shortness of breath. Nothing aggravates the symptoms. Nothing relieves the symptoms. He has tried nothing for the symptoms. The treatment provided no relief.    Past Medical History:  Diagnosis Date  . HTN (hypertension)     Patient Active Problem List   Diagnosis Date Noted  . Type 2 diabetes mellitus ([place]) 04/01/2016  . Epigastric hernia 12/01/2015  . Cutaneous skin tags 04/29/2013  . Allergic rhinitis 10/31/2012  . Skin lesion of left leg 12/15/2011  . HLD (hyperlipidemia) 09/07/2011  . OSA (obstructive sleep apnea) 06/17/2011  . Insomnia 05/30/2011  . Malaise 04/01/2011  . Hearing loss 01/17/2011  . Knee pain, bilateral 11/18/2010  . H/O: asbestos exposure 11/18/2010  . Inguinal hernia, right 11/07/2010  . HTN (hypertension) 08/17/2010  . Bradycardia 08/17/2010  . Tobacco abuse 08/17/2010    Past Surgical History:  Procedure  Laterality Date  . none         Home Medications    Prior to Admission medications   Medication Sig Start Date End Date Taking? Authorizing Provider  amLODipine (NORVASC) 10 MG tablet TAKE ONE TABLET BY MOUTH DAILY. 07/03/15  Yes Leeanne Rio, MD  aspirin EC 81 MG tablet Take 1 tablet (81 mg total) by mouth daily. 04/15/13 03/28/17 Yes Melony Overly, MD  atorvastatin (LIPITOR) 40 MG tablet TAKE ONE TABLET BY MOUTH DAILY. 07/03/15  Yes Leeanne Rio, MD  Fexofenadine HCl (ALLEGRA PO) Take 1 tablet by mouth daily.    Yes Historical Provider, MD  fluticasone (FLONASE) 50 MCG/ACT nasal spray Place 1 spray into both nostrils daily. 12/27/15  Yes Jacqualine Mau, NP  lisinopril (PRINIVIL,ZESTRIL) 5 MG tablet TAKE ONE TABLET BY MOUTH DAILY. 07/03/15  Yes Leeanne Rio, MD  triamterene-hydrochlorothiazide (MAXZIDE-25) 37.5-25 MG tablet TAKE 1 TABLET BY MOUTH DAILY. 07/03/15  Yes Leeanne Rio, MD  VENTOLIN HFA 108 [phone] Base) MCG/ACT inhaler INHALE 2 PUFFS BY MOUTH EVERY 4 HOURS AS NEEDED FOR WHEEZING OR SHORTNESS OF BREATH. 05/08/15  Yes Leeanne Rio, MD    Family History Family History  Problem Relation Age of Onset  . Cancer Mother   . Heart disease Father     Social History Social History  Substance Use Topics  . Smoking status: Current Every Day Smoker    Packs/day: 0.50    Years: 43.00    Types: Cigarettes  . Smokeless tobacco: Never Used     Comment: pt is trying to aggressively cut back  on # cigs smoked daily.   . Alcohol use No     Allergies   Patient has no known allergies.   Review of Systems Review of Systems  Constitutional: Negative for chills and fever.  HENT: Negative for congestion and facial swelling.   Eyes: Negative for discharge and visual disturbance.  Respiratory: Negative for shortness of breath.   Cardiovascular: Negative for chest pain and palpitations.  Gastrointestinal: Negative for abdominal pain, diarrhea and  vomiting.  Musculoskeletal: Negative for arthralgias and myalgias.  Skin: Negative for color change and rash.  Neurological: Positive for speech difficulty. Negative for tremors, syncope and headaches.  Psychiatric/Behavioral: Negative for confusion and dysphoric mood.     Physical Exam Updated Vital Signs BP 121/73   Pulse (!) 55   Temp 98.1 F (36.7 C) (Oral)   Resp 17   SpO2 98%   Physical Exam  Constitutional: He is oriented to person, place, and time. He appears well-developed and well-nourished.  HENT:  Head: Normocephalic and atraumatic.  Eyes: EOM are normal. Pupils are equal, round, and reactive to light.  Neck: Normal range of motion. Neck supple. No JVD present.  Cardiovascular: Normal rate and regular rhythm.  Exam reveals no gallop and no friction rub.   No murmur heard. Pulmonary/Chest: No respiratory distress. He has no wheezes.  Abdominal: He exhibits no distension. There is no rebound and no guarding.  Musculoskeletal: Normal range of motion.  Neurological: He is alert and oriented to person, place, and time. He has normal strength. No cranial nerve deficit or sensory deficit. He displays a negative Romberg sign. Coordination and gait normal. GCS eye subscore is 4. GCS verbal subscore is 5. GCS motor subscore is 6. He displays no Babinski's sign on the right side. He displays no Babinski's sign on the left side.  Reflex Scores:      Tricep reflexes are 2+ on the right side and 2+ on the left side.      Bicep reflexes are 2+ on the right side and 2+ on the left side.      Brachioradialis reflexes are 2+ on the right side and 2+ on the left side.      Patellar reflexes are 2+ on the right side and 2+ on the left side.      Achilles reflexes are 2+ on the right side and 2+ on the left side. Slurred speech   Skin: No rash noted. No pallor.  Psychiatric: He has a normal mood and affect. His behavior is normal.  Nursing note and vitals reviewed.    ED Treatments  / Results  Labs (all labs ordered are listed, but only abnormal results are displayed) Labs Reviewed  COMPREHENSIVE METABOLIC PANEL - Abnormal; Notable for the following:       Result Value   Glucose, Bld 204 (*)    Total Bilirubin <0.1 (*)    All other components within normal limits  AMMONIA - Abnormal; Notable for the following:    Ammonia 41 (*)    All other components within normal limits  CBG MONITORING, ED - Abnormal; Notable for the following:    Glucose-Capillary 200 (*)    All other components within normal limits  I-STAT CHEM 8, ED - Abnormal; Notable for the following:    Glucose, Bld 205 (*)    Calcium, Ion 1.06 (*)    All other components within normal limits  PROTIME-INR  APTT  CBC  DIFFERENTIAL  MAGNESIUM  URINALYSIS, ROUTINE W REFLEX MICROSCOPIC  Randolm Idol, ED    EKG  EKG Interpretation  Date/Time:  Sunday April 03 2016 18:05:46 EST Ventricular Rate:  64 PR Interval:  160 QRS Duration: 86 QT Interval:  410 QTC Calculation: 422 R Axis:   115 Text Interpretation:  Normal sinus rhythm Left posterior fascicular block Cannot rule out Anterior infarct , age undetermined ST & T wave abnormality, consider lateral ischemia Abnormal ECG No significant change since last tracing Confirmed by Alyaan Budzynski MD, DANIEL ([id number]) on 04/03/2016 7:35:35 PM       Radiology Ct Head Wo Contrast  Result Date: 04/03/2016 CLINICAL DATA:  Slurred speech EXAM: CT HEAD WITHOUT CONTRAST TECHNIQUE: Contiguous axial images were obtained from the base of the skull through the vertex without intravenous contrast. COMPARISON:  None. FINDINGS: Brain: No findings to suggest acute hemorrhage, acute infarction or space-occupying mass lesion are noted. Lacunar infarct is noted in the left basal ganglia superiorly. No other focal abnormality is seen. Vascular: No hyperdense vessel or unexpected calcification. Skull: Normal. Negative for fracture or focal lesion. Sinuses/Orbits: No acute  finding. Other: None. IMPRESSION: Chronic ischemic changes in the left basal ganglia. No acute abnormality noted. Electronically Signed   By: Mark  Lukens M.D.   On: 04/03/2016 18:44   Mr Brain Wo Contrast  Result Date: 04/03/2016 CLINICAL DATA:  Initial evaluation for slurred speech. EXAM: MRI HEAD WITHOUT CONTRAST TECHNIQUE: Multiplanar, multiecho pulse sequences of the brain and surrounding structures were obtained without intravenous contrast. COMPARISON:  Prior CT from earlier the same day. FINDINGS: Brain: Cerebral volume within normal limits for age. No significant cerebral white matter disease. There is an 18 mm curvilinear focus of restricted diffusion involving the left lentiform nucleus, extending into the left internal capsule, compatible with acute ischemic infarct. No associated hemorrhage or mass effect. No other evidence for acute ischemia. Gray-white matter differentiation otherwise maintained. No evidence for acute or chronic intracranial hemorrhage. No other areas of chronic infarction. No mass lesion, midline shift, or mass effect. No hydrocephalus. No extra-axial fluid collection. Major dural sinuses are patent. Pituitary gland within normal limits.  Midline structures intact. Vascular: Major intracranial vascular flow voids are maintained. Skull and upper cervical spine: Craniocervical junction normal. Visualized upper cervical spine demonstrates mild multilevel degenerative spondylolysis without significant stenosis. Bone marrow signal intensity normal. No scalp soft tissue abnormality. Sinuses/Orbits: Globes and orbital soft tissues within normal limits. Mild scattered mucosal thickening throughout the paranasal sinuses. No air-fluid level to suggest active sinus infection. Small bilateral mastoid effusions noted. Inner ear structures grossly normal. IMPRESSION: 1. 18 mm acute ischemic nonhemorrhagic lacunar type infarction involving the left lentiform nucleus and internal capsule. 2.  Otherwise normal brain MRI. Electronically Signed   By: Benjamin  McClintock M.D.   On: 04/03/2016 23:29    Procedures Procedures (including critical care time)  Medications Ordered in ED Medications - No data to display   Initial Impression / Assessment and Plan / ED Course  I have reviewed the triage vital signs and the nursing notes.  Pertinent labs & imaging results that were available during my care of the patient were reviewed by me and considered in my medical decision making (see chart for details).  Clinical Course     65  yo M With a chief complaint of slurred speech. Patient has no other noted deficits on exam. CT of the head with no intercranial bleed or acute stroke finding. Laboratory evaluation with no specific cause for this.  I discussed the case with Dr. Cheral Marker, Neuro, recommended  MRI of the brain to rule out stroke. Also recommended a ammonia level UA magnesium.  Found to have acute stroke.   The patients results and plan were reviewed and discussed.   Any x-rays performed were independently reviewed by myself.   Differential diagnosis were considered with the presenting HPI.  Medications - No data to display  Vitals:   04/03/16 2045 04/03/16 2100 04/03/16 2115 04/03/16 2345  BP: 136/58 108/59 149/67 121/73  Pulse: (!) 54 (!) 58 (!) 55 (!) 55  Resp: 19 18 17 17   Temp:      TempSrc:      SpO2: 98% 94% 96% 98%    Final diagnoses:  Stroke, acute, embolic ([place])    Admission/ observation were discussed with the admitting physician, patient and/or family and they are comfortable with the plan.    Final Clinical Impressions(s) / ED Diagnoses   Final diagnoses:  Stroke, acute, embolic [facility])    New Prescriptions New Prescriptions   No medications on file     Deno Etienne, DO 04/04/16 0014

## 2016-04-03 NOTE — ED Triage Notes (Signed)
Pt states "yesterday morning I woke up to slurring my words and im still doing it". His doctor spoke to him and told him to come here for possible stroke. Pt has no other neuro deficits noted. Pt ambulatory with steady gait. Speech is mildly slurred but completely understandable.

## 2016-04-03 NOTE — ED Notes (Signed)
Symptoms started yesterday.

## 2016-04-03 NOTE — Telephone Encounter (Signed)
[name] After Hours Telephone Line   Number received via page: [phone] Person calling: [name] Relationship to patient: Patient's wife  Reason for call: Slurred speech  Patient's wife calling reporting that patient's speech has been noticeably slurred for the past two days. She is very concerned because he has high blood pressure. Patient denies any pain, numbness, weakness, or other symptoms. Slurred speech has not improved since it began two days ago. Discussed with patient's wife that given patient's persistent symptoms for the past two days, he should be evaluated for stroke, and recommended coming to the ED. Mrs. [name] voiced understanding and said she would bring him in for evaluation.   Adin Hector, MD, MPH PGY-2 [facility] Pager [phone]

## 2016-04-04 ENCOUNTER — Inpatient Hospital Stay (HOSPITAL_COMMUNITY): Payer: Medicare Other

## 2016-04-04 DIAGNOSIS — G4733 Obstructive sleep apnea (adult) (pediatric): Secondary | ICD-10-CM | POA: Diagnosis present

## 2016-04-04 DIAGNOSIS — E7800 Pure hypercholesterolemia, unspecified: Secondary | ICD-10-CM | POA: Diagnosis not present

## 2016-04-04 DIAGNOSIS — R4781 Slurred speech: Secondary | ICD-10-CM | POA: Diagnosis not present

## 2016-04-04 DIAGNOSIS — Z7951 Long term (current) use of inhaled steroids: Secondary | ICD-10-CM | POA: Diagnosis not present

## 2016-04-04 DIAGNOSIS — R471 Dysarthria and anarthria: Secondary | ICD-10-CM | POA: Diagnosis not present

## 2016-04-04 DIAGNOSIS — I6320 Cerebral infarction due to unspecified occlusion or stenosis of unspecified precerebral arteries: Secondary | ICD-10-CM

## 2016-04-04 DIAGNOSIS — Z6832 Body mass index (BMI) 32.0-32.9, adult: Secondary | ICD-10-CM | POA: Diagnosis not present

## 2016-04-04 DIAGNOSIS — R001 Bradycardia, unspecified: Secondary | ICD-10-CM | POA: Diagnosis present

## 2016-04-04 DIAGNOSIS — R4701 Aphasia: Secondary | ICD-10-CM | POA: Diagnosis not present

## 2016-04-04 DIAGNOSIS — I639 Cerebral infarction, unspecified: Secondary | ICD-10-CM | POA: Diagnosis not present

## 2016-04-04 DIAGNOSIS — E669 Obesity, unspecified: Secondary | ICD-10-CM | POA: Diagnosis present

## 2016-04-04 DIAGNOSIS — J309 Allergic rhinitis, unspecified: Secondary | ICD-10-CM | POA: Diagnosis present

## 2016-04-04 DIAGNOSIS — Z7982 Long term (current) use of aspirin: Secondary | ICD-10-CM | POA: Diagnosis not present

## 2016-04-04 DIAGNOSIS — I6789 Other cerebrovascular disease: Secondary | ICD-10-CM | POA: Diagnosis not present

## 2016-04-04 DIAGNOSIS — H9190 Unspecified hearing loss, unspecified ear: Secondary | ICD-10-CM | POA: Diagnosis present

## 2016-04-04 DIAGNOSIS — Z8673 Personal history of transient ischemic attack (TIA), and cerebral infarction without residual deficits: Secondary | ICD-10-CM | POA: Diagnosis present

## 2016-04-04 DIAGNOSIS — F17210 Nicotine dependence, cigarettes, uncomplicated: Secondary | ICD-10-CM | POA: Diagnosis present

## 2016-04-04 DIAGNOSIS — F17200 Nicotine dependence, unspecified, uncomplicated: Secondary | ICD-10-CM | POA: Diagnosis not present

## 2016-04-04 DIAGNOSIS — I10 Essential (primary) hypertension: Secondary | ICD-10-CM

## 2016-04-04 DIAGNOSIS — Z720 Tobacco use: Secondary | ICD-10-CM | POA: Diagnosis not present

## 2016-04-04 DIAGNOSIS — E785 Hyperlipidemia, unspecified: Secondary | ICD-10-CM | POA: Diagnosis present

## 2016-04-04 DIAGNOSIS — R29702 NIHSS score 2: Secondary | ICD-10-CM | POA: Diagnosis present

## 2016-04-04 DIAGNOSIS — E118 Type 2 diabetes mellitus with unspecified complications: Secondary | ICD-10-CM | POA: Diagnosis not present

## 2016-04-04 LAB — BASIC METABOLIC PANEL
Anion gap: 11 (ref 5–15)
BUN: 10 mg/dL (ref 6–20)
CHLORIDE: 105 mmol/L (ref 101–111)
CO2: 23 mmol/L (ref 22–32)
CREATININE: 1.16 mg/dL (ref 0.61–1.24)
Calcium: 8.7 mg/dL — ABNORMAL LOW (ref 8.9–10.3)
GFR calc non Af Amer: >60 mL/min (ref >60)
Glucose, Bld: 96 mg/dL (ref 65–99)
POTASSIUM: 4.5 mmol/L (ref 3.5–5.1)
SODIUM: 139 mmol/L (ref 135–145)

## 2016-04-04 LAB — ECHOCARDIOGRAM COMPLETE
Height: 67 in
WEIGHTICAEL: 3304 [oz_av]

## 2016-04-04 LAB — CBC
HCT: 43.5 % (ref 39.0–52.0)
HEMOGLOBIN: 14.3 g/dL (ref 13.0–17.0)
MCH: 29.5 pg (ref 26.0–34.0)
MCHC: 32.9 g/dL (ref 30.0–36.0)
MCV: 89.9 fL (ref 78.0–100.0)
PLATELETS: 224 10*3/uL (ref 150–400)
RBC: 4.84 MIL/uL (ref 4.22–5.81)
RDW: 13.6 % (ref 11.5–15.5)
WBC: 7.7 10*3/uL (ref 4.0–10.5)

## 2016-04-04 LAB — URINALYSIS, ROUTINE W REFLEX MICROSCOPIC
Bilirubin Urine: NEGATIVE
Glucose, UA: NEGATIVE mg/dL
Hgb urine dipstick: NEGATIVE
KETONES UR: NEGATIVE mg/dL
LEUKOCYTES UA: NEGATIVE
NITRITE: NEGATIVE
PH: 6 (ref 5.0–8.0)
Protein, ur: NEGATIVE mg/dL
Specific Gravity, Urine: 1.017 (ref 1.005–1.030)

## 2016-04-04 LAB — VAS US CAROTID
LEFT ECA DIAS: -18 cm/s
LEFT VERTEBRAL DIAS: 21 cm/s
LICAPDIAS: -32 cm/s
Left CCA dist dias: 12 cm/s
Left CCA dist sys: 55 cm/s
Left CCA prox dias: 12 cm/s
Left CCA prox sys: 66 cm/s
Left ICA dist dias: -17 cm/s
Left ICA dist sys: -57 cm/s
Left ICA prox sys: -89 cm/s
RCCAPDIAS: 14 cm/s
RIGHT ECA DIAS: -19 cm/s
RIGHT VERTEBRAL DIAS: 14 cm/s
Right CCA prox sys: 58 cm/s
Right cca dist sys: 102 cm/s

## 2016-04-04 LAB — LIPID PANEL
CHOL/HDL RATIO: 6.5 ratio
CHOLESTEROL: 150 mg/dL (ref 0–200)
HDL: 23 mg/dL — AB (ref >40)
LDL Cholesterol: 92 mg/dL (ref 0–99)
Triglycerides: 177 mg/dL — ABNORMAL HIGH (ref <150)
VLDL: 35 mg/dL (ref 0–40)

## 2016-04-04 LAB — TSH: TSH: 1.823 u[IU]/mL (ref 0.350–4.500)

## 2016-04-04 MED ORDER — SODIUM CHLORIDE 0.9% FLUSH
3.0000 mL | Freq: Two times a day (BID) | INTRAVENOUS | Status: DC
  Administered 2016-04-04 – 2016-04-05 (×3): 3 mL via INTRAVENOUS

## 2016-04-04 MED ORDER — ALBUTEROL SULFATE (2.5 MG/3ML) 0.083% IN NEBU: 2.5000 mg | INHALATION_SOLUTION | RESPIRATORY_TRACT | Status: DC | PRN

## 2016-04-04 MED ORDER — NICOTINE 14 MG/24HR TD PT24
14.0000 mg | MEDICATED_PATCH | Freq: Every day | TRANSDERMAL | Status: DC
Start: 2016-04-04 — End: 2016-04-05
  Administered 2016-04-04 – 2016-04-05 (×2): 14 mg via TRANSDERMAL
  Filled 2016-04-04 (×2): qty 1

## 2016-04-04 MED ORDER — ENOXAPARIN SODIUM 40 MG/0.4ML ~~LOC~~ SOLN
40.0000 mg | Freq: Every day | SUBCUTANEOUS | Status: DC
  Administered 2016-04-04 – 2016-04-05 (×2): 40 mg via SUBCUTANEOUS
  Filled 2016-04-04 (×2): qty 0.4

## 2016-04-04 MED ORDER — LORATADINE 10 MG PO TABS
10.0000 mg | ORAL_TABLET | Freq: Every day | ORAL | Status: DC
  Administered 2016-04-04 – 2016-04-05 (×2): 10 mg via ORAL
  Filled 2016-04-04 (×2): qty 1

## 2016-04-04 MED ORDER — ASPIRIN EC 81 MG PO TBEC: 81.0000 mg | DELAYED_RELEASE_TABLET | Freq: Every day | ORAL | Status: DC

## 2016-04-04 MED ORDER — CLOPIDOGREL BISULFATE 75 MG PO TABS
75.0000 mg | ORAL_TABLET | Freq: Every day | ORAL | Status: DC
  Administered 2016-04-04 – 2016-04-05 (×2): 75 mg via ORAL
  Filled 2016-04-04 (×2): qty 1

## 2016-04-04 MED ORDER — ATORVASTATIN CALCIUM 40 MG PO TABS
40.0000 mg | ORAL_TABLET | Freq: Every day | ORAL | Status: DC
  Administered 2016-04-04 – 2016-04-05 (×2): 40 mg via ORAL
  Filled 2016-04-04 (×2): qty 1

## 2016-04-04 MED ORDER — FLUTICASONE PROPIONATE 50 MCG/ACT NA SUSP
1.0000 | Freq: Every day | NASAL | Status: DC
  Administered 2016-04-04 – 2016-04-05 (×2): 1 via NASAL
  Filled 2016-04-04: qty 16

## 2016-04-04 NOTE — Progress Notes (Signed)
*  PRELIMINARY RESULTS* Vascular Ultrasound Carotid Duplex (Doppler) has been completed.  Findings suggest 40-59% right internal carotid artery stenosis and 1-39% left internal carotid artery stenosis. Vertebral arteries are patent with antegrade flow.  04/04/2016 11:21 AM Maudry Mayhew, BS, RVT, RDCS, RDMS

## 2016-04-04 NOTE — Care Management Note (Signed)
Case Management Note  Patient Details  Name: [name] MRN: [id number] Date of Birth: [birth year]  Subjective/Objective:             Patient was admitted with CVA. Lives at home with spouse and children. CM will follow for discharge needs pending PT/OT evals and physician orders.        Action/Plan:   Expected Discharge Date:  04/05/16               Expected Discharge Plan:     In-House Referral:     Discharge planning Services     Post Acute Care Choice:    Choice offered to:     DME Arranged:    DME Agency:     HH Arranged:    HH Agency:     Status of Service:     If discussed at [employer], dates discussed:    Additional Comments:  Rolm Baptise, RN 04/04/2016, 9:32 AM

## 2016-04-04 NOTE — H&P (Signed)
[facility] Admission History and Physical Service Pager: [phone]  Patient name: Jonathan [name] Medical record number: [id number] Date of birth: 1950/11/26 Age: 65 y.o. Gender: male  Primary Care Provider: Chrisandra Netters, MD Consultants: neurology Code Status: full  Chief Complaint: slurred speech  Assessment and Plan: Jonathan [name] is a 65 y.o. male presenting with slurred speech. PMH is significant for HTN, bradycardia, tobacco abuse, OSA, HLD, T2DM  Acute ischemic stroke- slurred speech since yesterday morning. Denies any other concerning symptoms. MRI in ED demonstrates acute ischemic stroke. No other focal deficits on neuro exam. Risk factors include HTN, HLD, pre-diabetes, smoking. Troponin 0 in ED. No chest pain. EKG with normal sinus rhythm. Already taking daily ASA.  -admit to tele, attending Dr. Ree Kida - Neuro checks q2 x12 hrs then ever 4 hours - Change ASA to Plavix - Carotid dopplers - Continue Lipitor - PT/OT consulted -neurology following, appreciate recommendations -am CBC, BMP -echocardiogram -TSH -repeat lipid panel -encourage lifestyle modifications including smoking cessation -am EKG  HTN- on norvasc, lisinopril, and maxzide at home. BP 141/76 in ED - Permissive HTN  -monitor BP  HLD- on atorvastatin 40mg  daily. Last lipid panel July 2014.  -continue home statin -repeat lipid panel  Pre-diabetes- not on any medications, last A1C 6.5 on 03/28/16 -CBG AC/HS -follow as outpatient  Tobacco abuse - Smokes 1 ppd. 50 pack year history. - Nicotine patch  - encourage cessation - would benefit from PFT's as outpatient given smoking history  OSA - Has CPAP at home but does not use it  FEN/GI: NPO until passes bedside swallow eval Prophylaxis: lovenox  Disposition: pending clinical improvement  History of Present Illness:  Jonathan [name] is a 65 y.o. male presenting with slurred speech.   Patient states he woke up  yesterday morning and noticed his speech was slurred. This has continued into today prompting him to come to ED. Slurred speech has stayed the same, not worsened or improved. Denies any other concerning symptoms including weakness, numbness, tingling, chest pain, palpitations. He has never experienced this before.   Review Of Systems: Per HPI with the following additions:  Review of Systems  Constitutional: Negative for chills and fever.  HENT: Positive for hearing loss.   Eyes: Negative for blurred vision.  Respiratory: Positive for cough. Negative for shortness of breath.   Cardiovascular: Negative for chest pain and palpitations.  Neurological: Positive for speech change. Negative for tingling, sensory change, focal weakness and headaches.  Psychiatric/Behavioral: Negative for substance abuse.    Patient Active Problem List   Diagnosis Date Noted  . Stroke ([place]) 04/04/2016  . CVA (cerebral vascular accident) ([place]) 04/04/2016  . Type 2 diabetes mellitus ([place]) 04/01/2016  . Epigastric hernia 12/01/2015  . Cutaneous skin tags 04/29/2013  . Allergic rhinitis 10/31/2012  . Skin lesion of left leg 12/15/2011  . HLD (hyperlipidemia) 09/07/2011  . OSA (obstructive sleep apnea) 06/17/2011  . Insomnia 05/30/2011  . Malaise 04/01/2011  . Hearing loss 01/17/2011  . Knee pain, bilateral 11/18/2010  . H/O: asbestos exposure 11/18/2010  . Inguinal hernia, right 11/07/2010  . HTN (hypertension) 08/17/2010  . Bradycardia 08/17/2010  . Tobacco abuse 08/17/2010    Past Medical History: Past Medical History:  Diagnosis Date  . HTN (hypertension)     Past Surgical History: Past Surgical History:  Procedure Laterality Date  . none      Social History: Social History  Substance Use Topics  . Smoking status: Current Every Day  Smoker    Packs/day: 0.50    Years: 43.00    Types: Cigarettes  . Smokeless tobacco: Never Used     Comment: pt is trying to aggressively cut back on # cigs  smoked daily.   . Alcohol use No   Additional social history: lives with wife Please also refer to relevant sections of EMR.  Family History: Family History  Problem Relation Age of Onset  . Cancer Mother   . Heart disease Father    Allergies and Medications: No Known Allergies No current facility-administered medications on file prior to encounter.    Current Outpatient Prescriptions on File Prior to Encounter  Medication Sig Dispense Refill  . amLODipine (NORVASC) 10 MG tablet TAKE ONE TABLET BY MOUTH DAILY. 30 tablet 0  . aspirin EC 81 MG tablet Take 1 tablet (81 mg total) by mouth daily. 90 tablet 3  . atorvastatin (LIPITOR) 40 MG tablet TAKE ONE TABLET BY MOUTH DAILY. 30 tablet 0  . Fexofenadine HCl (ALLEGRA PO) Take 1 tablet by mouth daily.     . fluticasone (FLONASE) 50 MCG/ACT nasal spray Place 1 spray into both nostrils daily. 16 g 2  . lisinopril (PRINIVIL,ZESTRIL) 5 MG tablet TAKE ONE TABLET BY MOUTH DAILY. 30 tablet 0  . triamterene-hydrochlorothiazide (MAXZIDE-25) 37.5-25 MG tablet TAKE 1 TABLET BY MOUTH DAILY. 30 tablet 0  . VENTOLIN HFA 108 (90 Base) MCG/ACT inhaler INHALE 2 PUFFS BY MOUTH EVERY 4 HOURS AS NEEDED FOR WHEEZING OR SHORTNESS OF BREATH. 18 each 2    Objective: BP 129/70 (BP Location: Left Arm)   Pulse (!) 58   Temp 98.1 F (36.7 C) (Oral)   Resp 18   Ht 5\' 7"  (1.702 m)   Wt 206 lb 8 oz (93.7 kg)   SpO2 96%   BMI 32.34 kg/m  Exam: General: elderly man in no acute distress Eyes: PERRLA, EOMI ENTM: moist mucous membranes, adentulous Neck: supple, non-tender, no lymphadenopathy Cardiovascular: RRR no MRG Respiratory: CTA bilaterally no increased work of breathing Gastrointestinal: obese abdomen. Soft, non-tender. +BS MSK: extremities warm, well perfused, no edema or cyanosis Derm: warm, dry. Scattered scabbed bug bites on lower extremities Neuro: Slurred speech. CN2-12 in tact. 5/5 strength in upper and lower extremities bilaterally. Sensation  in tact throughout. No facial droop or asymmetry. Fully alert and oriented. Finger to nose with no issues bilaterally.  Psych: normal mood and affect  Labs and Imaging: CBC BMET   Recent Labs Lab 04/04/16 0515  WBC 7.7  HGB 14.3  HCT 43.5  PLT 224    Recent Labs Lab 04/04/16 0515  NA 139  K 4.5  CL 105  CO2 23  BUN 10  CREATININE 1.16  GLUCOSE 96  CALCIUM 8.7*     Ct Head Wo Contrast  Result Date: 04/03/2016 CLINICAL DATA:  Slurred speech EXAM: CT HEAD WITHOUT CONTRAST TECHNIQUE: Contiguous axial images were obtained from the base of the skull through the vertex without intravenous contrast. COMPARISON:  None. FINDINGS: Brain: No findings to suggest acute hemorrhage, acute infarction or space-occupying mass lesion are noted. Lacunar infarct is noted in the left basal ganglia superiorly. No other focal abnormality is seen. Vascular: No hyperdense vessel or unexpected calcification. Skull: Normal. Negative for fracture or focal lesion. Sinuses/Orbits: No acute finding. Other: None. IMPRESSION: Chronic ischemic changes in the left basal ganglia. No acute abnormality noted. Electronically Signed   By: Inez Catalina M.D.   On: 04/03/2016 18:44   Mr Brain Wo Contrast  Result Date: 04/03/2016 CLINICAL DATA:  Initial evaluation for slurred speech. EXAM: MRI HEAD WITHOUT CONTRAST TECHNIQUE: Multiplanar, multiecho pulse sequences of the brain and surrounding structures were obtained without intravenous contrast. COMPARISON:  Prior CT from earlier the same day. FINDINGS: Brain: Cerebral volume within normal limits for age. No significant cerebral white matter disease. There is an 18 mm curvilinear focus of restricted diffusion involving the left lentiform nucleus, extending into the left internal capsule, compatible with acute ischemic infarct. No associated hemorrhage or mass effect. No other evidence for acute ischemia. Gray-white matter differentiation otherwise maintained. No evidence for  acute or chronic intracranial hemorrhage. No other areas of chronic infarction. No mass lesion, midline shift, or mass effect. No hydrocephalus. No extra-axial fluid collection. Major dural sinuses are patent. Pituitary gland within normal limits.  Midline structures intact. Vascular: Major intracranial vascular flow voids are maintained. Skull and upper cervical spine: Craniocervical junction normal. Visualized upper cervical spine demonstrates mild multilevel degenerative spondylolysis without significant stenosis. Bone marrow signal intensity normal. No scalp soft tissue abnormality. Sinuses/Orbits: Globes and orbital soft tissues within normal limits. Mild scattered mucosal thickening throughout the paranasal sinuses. No air-fluid level to suggest active sinus infection. Small bilateral mastoid effusions noted. Inner ear structures grossly normal. IMPRESSION: 1. 18 mm acute ischemic nonhemorrhagic lacunar type infarction involving the left lentiform nucleus and internal capsule. 2. Otherwise normal brain MRI. Electronically Signed   By: Jeannine Boga M.D.   On: 04/03/2016 23:29     Verner Mould, MD 04/04/2016, 7:15 AM PGY-1, [facility] pager: [phone], text pages welcome  UPPER LEVEL ADDENDUM  I have read the above note and made revisions highlighted in orange.  Adin Hector, MD, MPH PGY-2 [facility] Pager [phone]

## 2016-04-04 NOTE — Evaluation (Signed)
Occupational Therapy Evaluation Patient Details Name: Jonathan [name] MRN: [id number] DOB: 1950/12/03 Today's Date: 04/04/2016    History of Present Illness   65 yo male admitted with slurred speech MRI (+) 18 mm acute ischemic nonhemorrhagic lacunar type infarction involving the left lentiform nucleus and internal capsule. PMH:  Past Medical History:  Diagnosis Date  . HTN (hypertension)       Clinical Impression   Patient evaluated by Occupational Therapy with no further acute OT needs identified. All education has been completed and the patient has no further questions. See below for any follow-up Occupational Therapy or equipment needs.  Recommend SLP consult due to slurred speech. Pt reports he has not reached baseline for speech at this time. No cognitive deficits noted during OT eval regarding simple money management.OT to sign off. Thank you for referral.      Follow Up Recommendations  No OT follow up    Equipment Recommendations  None recommended by OT    Recommendations for Other Services       Precautions / Restrictions Precautions Precautions: None      Mobility Bed Mobility Overal bed mobility: Independent                Transfers Overall transfer level: Independent                    Balance Overall balance assessment: Independent                               Standardized Balance Assessment Standardized Balance Assessment : Berg Balance Test;Dynamic Gait Index Berg Balance Test Turn 360 Degrees: Able to turn 360 degrees safely in 4 seconds or less Dynamic Gait Index Level Surface: Normal Change in Gait Speed: Normal Gait with Horizontal Head Turns: Normal Gait with Vertical Head Turns: Normal Gait and Pivot Turn: Normal Step Over Obstacle: Normal Step Around Obstacles: Normal Steps: Normal Total Score: 24      ADL Overall ADL's : Independent                                              Vision Vision Assessment?: No apparent visual deficits   Perception     Praxis      Pertinent Vitals/Pain Pain Assessment: No/denies pain     Hand Dominance Right   Extremity/Trunk Assessment Upper Extremity Assessment Upper Extremity Assessment: Overall WFL for tasks assessed   Lower Extremity Assessment Lower Extremity Assessment: Defer to PT evaluation (reports sensation the same)   Cervical / Trunk Assessment Cervical / Trunk Assessment: Normal   Communication Communication Communication: Expressive difficulties   Cognition Arousal/Alertness: Awake/alert Behavior During Therapy: WFL for tasks assessed/performed Overall Cognitive Status: Within Functional Limits for tasks assessed                     General Comments       Exercises       Shoulder Instructions      Home Living Family/patient expects to be discharged to:: Private residence Living Arrangements: Spouse/significant other;Children (daughter and son) Available Help at Discharge: Family;Available PRN/intermittently Type of Home: Mobile home Home Access: Stairs to enter Entrance Stairs-Number of Steps: 5 Entrance Stairs-Rails: Right Home Layout: One level     Bathroom Shower/Tub: Teacher, early years/pre:  Standard     Home Equipment: Cane - single point   Additional Comments: daughter can be there in afternoon and son will be there at night      Prior Functioning/Environment Level of Independence: Independent                 OT Problem List:     OT Treatment/Interventions:      OT Goals(Current goals can be found in the care plan section)    OT Frequency:     Barriers to D/C:            Co-evaluation              End of Session Equipment Utilized During Treatment: Gait belt Nurse Communication: Mobility status  Activity Tolerance: Patient tolerated treatment well Patient left: in chair;with call bell/phone within reach   Time:  0830-0848 OT Time Calculation (min): 18 min Charges:  OT General Charges $OT Visit: 1 Procedure OT Evaluation $OT Eval Moderate Complexity: 1 Procedure G-Codes:    Parke Poisson B 04-29-16, 8:46 AM   Jeri Modena   OTR/L [phone] Office: [phone] .

## 2016-04-04 NOTE — Consult Note (Signed)
NEURO HOSPITALIST CONSULT NOTE   Requestig physician: Dr. Ree Kida  Reason for Consult: Stroke  History obtained from:   Patient and Chart     HPI:                                                                                                                                          Jonathan [name] is an 65 y.o. male who presented to the ED for slurred speech x 2 days. There was no associated headache, weakness, gait imbalance, vision change or confusion. Speech comprehension and content were normal, with slurring being the sole symptom. Initial NIHSS was 2. MRI revealed an acute lacunar infarction.   Past Medical History:  Diagnosis Date  . HTN (hypertension)     Past Surgical History:  Procedure Laterality Date  . none      Family History  Problem Relation Age of Onset  . Cancer Mother   . Heart disease Father     Social History:  reports that he has been smoking Cigarettes.  He has a 21.50 pack-year smoking history. He has never used smokeless tobacco. He reports that he does not drink alcohol or use drugs.  No Known Allergies  MEDICATIONS:                                                                                                                     amLODipine (NORVASC) 10 MG tablet TAKE ONE TABLET BY MOUTH DAILY. Verner Mould, MD Not Ordered  aspirin EC 81 MG tablet Take 1 tablet (81 mg total) by mouth daily. Verner Mould, MD Reordered  Orderedas:aspirin EC tablet 81 mg - 81 mg, Oral, Daily, First dose on Mon 04/04/16 at 1000  atorvastatin (LIPITOR) 40 MG tablet TAKE ONE TABLET BY MOUTH DAILY. Verner Mould, MD Reordered  Orderedas:atorvastatin (LIPITOR) tablet 40 mg - 40 mg, Oral, Daily, First dose on Mon 04/04/16 at 1000  Fexofenadine HCl (ALLEGRA PO) Take 1 tablet by mouth daily.  Verner Mould, MD Reordered  Orderedas:loratadine [facility]) tablet 10 mg - 10 mg, Oral, Daily, First dose on Mon  04/04/16 at 1000  fluticasone (FLONASE) 50 MCG/ACT nasal spray Place 1 spray into both nostrils daily. Verner Mould, MD Reordered  Orderedas:fluticasone [facility]) 50 MCG/ACT nasal  spray 1 spray - 1 spray, Each Nare, Daily, First dose on Mon 04/04/16 at 1000  lisinopril (PRINIVIL,ZESTRIL) 5 MG tablet TAKE ONE TABLET BY MOUTH DAILY. Verner Mould, MD Not Ordered  triamterene-hydrochlorothiazide (MAXZIDE-25) 37.5-25 MG tablet TAKE 1 TABLET BY MOUTH DAILY. Verner Mould, MD Not Ordered  VENTOLIN HFA 108 (90 Base) MCG/ACT inhaler INHALE 2 PUFFS BY MOUTH EVERY 4 HOURS AS NEEDED FOR WHEEZING OR SHORTNESS OF BREATH. Verner Mould, MD Reordered    ROS:                                                                                                                                       History obtained from patient. Denies headache, SOB, CP, abdominal pain or limb pain. Denies weakness or vision changes. Other ROS as per HPI.   Blood pressure (!) 148/77, pulse (!) 55, temperature 98.1 F (36.7 C), temperature source Oral, resp. rate 16, height 5\' 7"  (1.702 m), weight 93.7 kg (206 lb 8 oz), SpO2 96 %.   General Examination:                                                                                                      HEENT-  Normocephalic/atraumatic.  Lungs- No gross wheezes. Respirations unlabored.  Extremities- Warm and well-perfused.   Neurological Examination Mental Status: Alert, oriented, thought content appropriate.  Speech fluent, with intact comprehension, naming and repetition. Able to follow all commands without difficulty. Cranial Nerves: II: Visual fields intact to confrontation, PERRL III,IV, VI: ptosis not present, EOMI V,VII: Dysarthria noted. Smile symmetric, facial temperature sensation normal bilaterally VIII: hearing acuity is decreased IX,X: No hypophonia. Slight nasal quality to speech is noted in addition to pharyngeal  dysarthria.  XI: Shoulder shrug is equal bilaterally XII: midline tongue extension Motor: Right : Upper extremity   5/5 except for 4+/5 deltoid  Left:     Upper extremity   5/5  Lower extremity   5/5     Lower extremity   5/5 Normal tone throughout; no atrophy noted Sensory: Temperature and light touch intact x 4, without extinction Deep Tendon Reflexes: 2+ and symmetric throughout Plantars: Right: downgoing   Left: downgoing Cerebellar: Subtle dysmetria with FNF and HS on right Gait: Deferred   Lab Results: Basic Metabolic Panel:  Recent Labs Lab 04/03/16 1804 04/03/16 1812 04/03/16 2045  NA 136 138  --   K 4.3 4.3  --   CL 105 104  --   CO2  22  --   --   GLUCOSE 204* 205*  --   BUN 12 14  --   CREATININE 1.06 1.10  --   CALCIUM 8.9  --   --   MG  --   --  2.1    Liver Function Tests:  Recent Labs Lab 04/03/16 1804  AST 25  ALT 29  ALKPHOS 95  BILITOT <0.1*  PROT 6.9  ALBUMIN 4.1   No results for input(s): LIPASE, AMYLASE in the last 168 hours.  Recent Labs Lab 04/03/16 2045  AMMONIA 41*    CBC:  Recent Labs Lab 04/03/16 1804 04/03/16 1812  WBC 7.5  --   NEUTROABS 4.4  --   HGB 14.8 15.0  HCT 44.1 44.0  MCV 89.8  --   PLT 240  --     Cardiac Enzymes: No results for input(s): CKTOTAL, CKMB, CKMBINDEX, TROPONINI in the last 168 hours.  Lipid Panel: No results for input(s): CHOL, TRIG, HDL, CHOLHDL, VLDL, LDLCALC in the last 168 hours.  CBG:  Recent Labs Lab 04/03/16 1805  GLUCAP 200*    Microbiology: No results found for this or any previous visit.  Coagulation Studies:  Recent Labs  04/03/16 1804  LABPROT 12.3  INR 0.91    Imaging: Ct Head Wo Contrast  Result Date: 04/03/2016 CLINICAL DATA:  Slurred speech EXAM: CT HEAD WITHOUT CONTRAST TECHNIQUE: Contiguous axial images were obtained from the base of the skull through the vertex without intravenous contrast. COMPARISON:  None. FINDINGS: Brain: No findings to suggest  acute hemorrhage, acute infarction or space-occupying mass lesion are noted. Lacunar infarct is noted in the left basal ganglia superiorly. No other focal abnormality is seen. Vascular: No hyperdense vessel or unexpected calcification. Skull: Normal. Negative for fracture or focal lesion. Sinuses/Orbits: No acute finding. Other: None. IMPRESSION: Chronic ischemic changes in the left basal ganglia. No acute abnormality noted. Electronically Signed   By: Inez Catalina M.D.   On: 04/03/2016 18:44   Mr Brain Wo Contrast  Result Date: 04/03/2016 CLINICAL DATA:  Initial evaluation for slurred speech. EXAM: MRI HEAD WITHOUT CONTRAST TECHNIQUE: Multiplanar, multiecho pulse sequences of the brain and surrounding structures were obtained without intravenous contrast. COMPARISON:  Prior CT from earlier the same day. FINDINGS: Brain: Cerebral volume within normal limits for age. No significant cerebral white matter disease. There is an 18 mm curvilinear focus of restricted diffusion involving the left lentiform nucleus, extending into the left internal capsule, compatible with acute ischemic infarct. No associated hemorrhage or mass effect. No other evidence for acute ischemia. Gray-white matter differentiation otherwise maintained. No evidence for acute or chronic intracranial hemorrhage. No other areas of chronic infarction. No mass lesion, midline shift, or mass effect. No hydrocephalus. No extra-axial fluid collection. Major dural sinuses are patent. Pituitary gland within normal limits.  Midline structures intact. Vascular: Major intracranial vascular flow voids are maintained. Skull and upper cervical spine: Craniocervical junction normal. Visualized upper cervical spine demonstrates mild multilevel degenerative spondylolysis without significant stenosis. Bone marrow signal intensity normal. No scalp soft tissue abnormality. Sinuses/Orbits: Globes and orbital soft tissues within normal limits. Mild scattered mucosal  thickening throughout the paranasal sinuses. No air-fluid level to suggest active sinus infection. Small bilateral mastoid effusions noted. Inner ear structures grossly normal. IMPRESSION: 1. 18 mm acute ischemic nonhemorrhagic lacunar type infarction involving the left lentiform nucleus and internal capsule. 2. Otherwise normal brain MRI. Electronically Signed   By: Jeannine Boga M.D.   On: 04/03/2016 23:29  Assessment: 1. Acute lacunar infarction involving the left lentiform nucleus and internal capsule. Complains of slurred speech only, but exam reveals subtle right upper extremity motor weakness and right sided dystaxia. Classifiable as an ASA failure.  2. Stroke risk factors: HTN, HLD, smoking and age. 3. No atrial fibrillation seen on EKG.    Recommendations: 1. MRA brain.  2. Carotid ultrasound.  3. TTE.  4. Cardiac telemetry. 5. BP management. Out of permissive HTN time window.  6. Switch ASA to Plavix.  7. Continue Lipitor 40 mg po qd.  8. PT/OT/Speech.   Electronically signed: Dr. Kerney Elbe 04/04/2016, 2:46 AM

## 2016-04-04 NOTE — ED Notes (Signed)
Pt presents to the ED today with aphasia.  Initial NIH was a 2 due to pt slurring a couple words and his inability to read past a 3rd grade level. All neuro checks have been WDL.  Pt denies weakness or numbness. MRI showed 49mm ischemic infarction.  Labs are WDL. Last vitals 141/76, HR 53, RR 17, and SPO2 97%. Pt passed swallow test. 20g RAC.

## 2016-04-04 NOTE — Evaluation (Signed)
Speech Language Pathology Evaluation Patient Details Name: Jonathan [name] MRN: [id number] DOB: April 17, 1951 Today's Date: 04/04/2016 Time: [id number] SLP Time Calculation (min) (ACUTE ONLY): 15 min  Problem List:  Patient Active Problem List   Diagnosis Date Noted  . Stroke ([place]) 04/04/2016  . CVA (cerebral vascular accident) ([place]) 04/04/2016  . Pure hypercholesterolemia   . Type 2 diabetes mellitus ([place]) 04/01/2016  . Epigastric hernia 12/01/2015  . Cutaneous skin tags 04/29/2013  . Allergic rhinitis 10/31/2012  . Skin lesion of left leg 12/15/2011  . HLD (hyperlipidemia) 09/07/2011  . OSA (obstructive sleep apnea) 06/17/2011  . Insomnia 05/30/2011  . Malaise 04/01/2011  . Hearing loss 01/17/2011  . Knee pain, bilateral 11/18/2010  . H/O: asbestos exposure 11/18/2010  . Inguinal hernia, right 11/07/2010  . Essential hypertension 08/17/2010  . Bradycardia 08/17/2010  . Tobacco abuse 08/17/2010   Past Medical History:  Past Medical History:  Diagnosis Date  . HTN (hypertension)    Past Surgical History:  Past Surgical History:  Procedure Laterality Date  . none     HPI:  65 yo male admitted with slurred speech MRI (+) 18 mm acute ischemic nonhemorrhagic lacunar type infarction involving the left lentiform nucleus and internal capsule.   Assessment / Plan / Recommendation Clinical Impression  Speech evaluation complete.  Oral motor exam remarkable for mildly decreased left sided velum elevation and decreased coordination noted in connected speech.  Speech errors occur in consonant clusters and only minimally impact intelligibility at the word-phrase level.  Sentence level expression was more impaired and patient required Min verbal cues to self-monitor and correct errors following education on speech intelligibilty strategies.  Patient Mod I in structured reading tasks, as a result, SLP provided handout to facilitate carryover and follow up outpatient is recommended if patient  has not returned to baseline in a few weeks.      SLP Assessment  Patient does not need any further Speech Lanaguage Pathology Services    Follow Up Recommendations  None;Other (comment) (outpatient SLP if deficits persist in a few weeks )               SLP Evaluation Cognition  Orientation Level: Oriented X4                   Oral / Motor  Oral Motor/Sensory Function Overall Oral Motor/Sensory Function: Mild impairment Facial ROM: Within Functional Limits Facial Symmetry: Within Functional Limits Facial Strength: Reduced right Facial Sensation: Within Functional Limits Lingual ROM: Within Functional Limits Lingual Symmetry: Within Functional Limits Lingual Strength: Within Functional Limits Velum: Impaired left Mandible: Within Functional Limits Motor Speech Overall Motor Speech: Impaired Respiration: Within functional limits Phonation: Normal Resonance: Within functional limits Articulation: Impaired Level of Impairment: Phrase Intelligibility: Intelligibility reduced Word: 75-100% accurate Phrase: 75-100% accurate Sentence: 50-74% accurate Conversation: 50-74% accurate Motor Speech Errors: Aware;Consistent Interfering Components: Inadequate dentition Effective Techniques: Slow rate;Over-articulate;Pacing   GO                    Carmelia Roller., CCC-SLP [id number]  Bonniejean Piano 04/04/2016, 5:09 PM

## 2016-04-04 NOTE — Progress Notes (Signed)
Pt returned to floor from Echo and Vascular

## 2016-04-04 NOTE — Progress Notes (Signed)
   04/04/16 1400  Clinical Encounter Type  Visited With Patient  Visit Type Other (Comment) ([facility] consult)  Spiritual Encounters  Spiritual Needs Emotional  Stress Factors  Patient Stress Factors Health changes  Introduction to Pt. Gave copy and explained Advance Directive to Pt. Follow up to be completed by Floor Chaplain.

## 2016-04-04 NOTE — Progress Notes (Signed)
PT Cancellation Note  Patient Details Name: DARLY KOS MRN: [id number] DOB: 1951-04-22   Cancelled Treatment:    Reason Eval/Treat Not Completed: PT screened, no needs identified, will sign off   See OT eval (pt independent with all mobility and balance tasks). Patient reports he feels his walking and balance are at baseline. He agrees he has no PT needs.   Jeanie Cooks Nyzier Boivin 04/04/2016, 4:03 PM Pager [phone]

## 2016-04-04 NOTE — Progress Notes (Signed)
  FPTS Interim Progress Note  S: Jonathan [name] is feeling very well this morning. His speech has improved tremendously since last night. Denies weakness. No other complaints or concerns.   O: BP 129/70 (BP Location: Left Arm)   Pulse (!) 58   Temp 98.1 F (36.7 C) (Oral)   Resp 18   Ht 5\' 7"  (1.702 m)   Wt 206 lb 8 oz (93.7 kg)   SpO2 96%   BMI 32.34 kg/m   Gen: elderly gentleman sitting up in chair in NAD Cardiac: RRR no MRG Respiratory: CTA bilaterally no increased WOB Abdomen: soft, non-tender, +BS Extremities: no edema or cyanosis Neuro: no focal deficits. Speech normal  A/P:  Acute ischemic stroke- slurred speech has resolved.  - Neuro checks q 4 hours - d/c aspirin, start Plavix 75 mg - Continue Lipitor - PT/OT consult -neurology following, appreciate recommendations -brain MRA pending -carotid dopplers pending - Carotid dopplers pending -echocardiogram pending -TSH- 1.823 -repeat lipid panel- total cholesterol 150, tris 177, LDL 92, HDL 23 -encourage lifestyle modifications including smoking cessation -am EKG- unchanged from prior  HTN- on norvasc, lisinopril, and maxzide at home. BP 141/76 in ED, overnight 129/70. -Permissive HTN  -monitor BP  Tobacco abuse - Smokes 1 ppd. 50 pack year history. - Nicotine patch  - encourage cessation - would benefit from PFT's as outpatient given smoking history  Steve Rattler, DO 04/04/2016, 8:15 AM PGY-1, [facility] Medicine Service pager [phone]

## 2016-04-04 NOTE — Progress Notes (Signed)
STROKE TEAM PROGRESS NOTE   HISTORY OF PRESENT ILLNESS (per record) Jonathan [name] is an 65 y.o. male who presented to the ED for slurred speech x 2 days. There was no associated headache, weakness, gait imbalance, vision change or confusion. Speech comprehension and content were normal, with slurring being the sole symptom. Initial NIHSS was 2. MRI revealed an acute lacunar infarction. Patient was not administered IV t-PA secondary to Delay in arrival. He was admitted for further evaluation and treatment.   SUBJECTIVE (INTERVAL HISTORY) No family is at the bedside.  Patient up in the chair at the bedside. Overall he feels his condition is stable.    OBJECTIVE Temp:  [98.1 F (36.7 C)-98.8 F (37.1 C)] 98.8 F (37.1 C) (12/11 1000) Pulse Rate:  [46-63] 52 (12/11 1000) Cardiac Rhythm: Sinus bradycardia (12/11 1121) Resp:  [16-19] 18 (12/11 1000) BP: (108-169)/(58-92) 136/82 (12/11 1000) SpO2:  [94 %-98 %] 98 % (12/11 1000) Weight:  [93.7 kg (206 lb 8 oz)] 93.7 kg (206 lb 8 oz) (12/11 0202)  CBC:   Recent Labs Lab 04/03/16 1804 04/03/16 1812 04/04/16 0515  WBC 7.5  --  7.7  NEUTROABS 4.4  --   --   HGB 14.8 15.0 14.3  HCT 44.1 44.0 43.5  MCV 89.8  --  89.9  PLT 240  --  [id number]    Basic Metabolic Panel:   Recent Labs Lab 04/03/16 1804 04/03/16 1812 04/03/16 2045 04/04/16 0515  NA 136 138  --  139  K 4.3 4.3  --  4.5  CL 105 104  --  105  CO2 22  --   --  23  GLUCOSE 204* 205*  --  96  BUN 12 14  --  10  CREATININE 1.06 1.10  --  1.16  CALCIUM 8.9  --   --  8.7*  MG  --   --  2.1  --     Lipid Panel:     Component Value Date/Time   CHOL 150 04/04/2016 0515   TRIG 177 (H) 04/04/2016 0515   HDL 23 (L) 04/04/2016 0515   CHOLHDL 6.5 04/04/2016 0515   VLDL 35 04/04/2016 0515   LDLCALC 92 04/04/2016 0515   HgbA1c:  Lab Results  Component Value Date   HGBA1C 6.5 03/28/2016    IMAGING  Ct Head Wo Contrast 04/03/2016 Chronic ischemic changes in the left  basal ganglia. No acute abnormality noted.  Mr Brain Wo Contrast 04/03/2016 1. 18 mm acute ischemic nonhemorrhagic lacunar type infarction involving the left lentiform nucleus and internal capsule. 2. Otherwise normal brain MRI. Electronically  Carotid Doppler   40-59% right internal carotid artery stenosis and 1-39% left internal carotid artery stenosis. Vertebral arteries are patent with antegrade flow.   PHYSICAL EXAM Pleasant middle-age Caucasian male currently not in distress. . Afebrile. Head is nontraumatic. Neck is supple without bruit.    Cardiac exam no murmur or gallop. Lungs are clear to auscultation. Distal pulses are well felt. Neurological Exam ;  Awake  Alert oriented x 3. Mild dysarthric speech and right lower facial weakness .eye movements full without nystagmus.fundi were not visualized. Vision acuity and fields appear normal. Hearing is normal. Palatal movements are normal. Face symmetric. Tongue midline. Normal strength, tone, reflexes and coordination. Normal sensation. Gait deferred.  ASSESSMENT/PLAN [name] is a 65 y.o. male with history of HTN, bradycardia, tobacco abuse, OSA, HLD and type II diabetes presenting with slurred speech x 2 days. He did  not receive IV t-PA due to delay in arrival.   Stroke:  L lentiform nucleus/internal capsule infarct secondary to small vessel disease source  Resultant  dysarthria  MRI  L lentiform nucleus/internal capsule infarct  MRA  pending   Carotid Doppler  R 40-59% stenosis  2D Echo  pending   LDL 92  HgbA1c 6.5  Lovenox 30 mg sq daily for VTE prophylaxis Diet Heart Room service appropriate? Yes; Fluid consistency: Thin  aspirin 81 mg daily prior to admission, now on clopidogrel 75 mg daily. Continue plavix at discharge.   Patient counseled to be compliant with his antithrombotic medications  Ongoing aggressive stroke risk factor management  Therapy recommendations:  No OT  Disposition:  pending    Hypertension  Stable  Long-term BP goal normotensive  Hyperlipidemia  Home meds:  lipitor 40, resumed in hospital  LDL 92, goal < 70  Continue statin at discharge  Diabetes type II  HgbA1c 6.5, goal < 7.0  Controlled  Other Stroke Risk Factors  Cigarette smoker, advised to stop smoking  Obesity, Body mass index is 32.34 kg/m., recommend weight loss, diet and exercise as appropriate   Obstructive sleep apnea, does not use CPAP at home  Hospital day # 0  Radene Journey [facility] for Pager information 04/04/2016 2:04 PM  I have personally examined this patient, reviewed notes, independently viewed imaging studies, participated in medical decision making and plan of care.ROS completed by me personally and pertinent positives fully documented  I have made any additions or clarifications directly to the above note. Agree with note above.  He presented with dysarthria and mild facial weakness due to left internal capsule infarct from small vessel disease. Recommend change aspirin to Plavix for stroke prevention and patient counseled to quit smoking and is agreeable. Continue ongoing stroke evaluation. Greater than 50% time during this 25 minute visit was spent on counseling and coordination of care about stroke risk, prevention and treatment Antony Contras, MD Medical Director [place] Pager: [phone] 04/04/2016 2:22 PM  To contact Stroke Continuity provider, please refer to [email or web address] After hours, contact General Neurology

## 2016-04-05 ENCOUNTER — Other Ambulatory Visit: Payer: Self-pay | Admitting: Family Medicine

## 2016-04-05 DIAGNOSIS — F17200 Nicotine dependence, unspecified, uncomplicated: Secondary | ICD-10-CM

## 2016-04-05 LAB — CBC
HEMATOCRIT: 43.7 % (ref 39.0–52.0)
HEMOGLOBIN: 14.5 g/dL (ref 13.0–17.0)
MCH: 30 pg (ref 26.0–34.0)
MCHC: 33.2 g/dL (ref 30.0–36.0)
MCV: 90.3 fL (ref 78.0–100.0)
Platelets: 227 10*3/uL (ref 150–400)
RBC: 4.84 MIL/uL (ref 4.22–5.81)
RDW: 13.8 % (ref 11.5–15.5)
WBC: 7.7 10*3/uL (ref 4.0–10.5)

## 2016-04-05 LAB — BASIC METABOLIC PANEL
ANION GAP: 9 (ref 5–15)
BUN: 12 mg/dL (ref 6–20)
CHLORIDE: 105 mmol/L (ref 101–111)
CO2: 23 mmol/L (ref 22–32)
Calcium: 8.9 mg/dL (ref 8.9–10.3)
Creatinine, Ser: 1.16 mg/dL (ref 0.61–1.24)
GFR calc Af Amer: >60 mL/min (ref >60)
GFR calc non Af Amer: >60 mL/min (ref >60)
GLUCOSE: 99 mg/dL (ref 65–99)
POTASSIUM: 4.4 mmol/L (ref 3.5–5.1)
Sodium: 137 mmol/L (ref 135–145)

## 2016-04-05 MED ORDER — CLOPIDOGREL BISULFATE 75 MG PO TABS: 75.0000 mg | ORAL_TABLET | Freq: Every day | ORAL | 2 refills | Status: DC

## 2016-04-05 NOTE — Consult Note (Signed)
[facility] CM Primary Care Navigator  04/05/2016  Jonathan [name] [birth year] [id number]  Met with patient at the bedside to identify possible discharge needs. Patient reports having slurred speech that led to this admission.  Patient endorses Dr. Chrisandra [name] with [facility] as the primary care provider.    Patient shared using [facility] in [place] to obtain medications without any problem.  Patient reports managing his medications with wife's assistance straight out of the containers.  Patient states being able to drive prior to admission. He mentioned that he is unsure as to who will transport him if needed since wife(Jonathan [name]) does not drive per patient. [facility] List of Transportation Resources provided in case there will be a need for one. Patient was grateful for the shared resource information.  Wife will be the primary caregiver at home per patient.   Discharge plan is to go home as stated.  Patient voiced understanding to call primary care provider's office when he gets home, for a post discharge follow-up appointment within a week or sooner if needs arise. Patient letter given for his reminder.  Patient denies any further care coordination needs and disease management at this time. [facility] care management contact information provided for futureneeds that may arise.   For additional questions please contact:  Edwena Felty A. Maayan Jenning, BSN, RN-BC [facility] PRIMARY CARE Navigator Cell: [phone]

## 2016-04-05 NOTE — Care Management Note (Signed)
Case Management Note  Patient Details  Name: Jonathan [name] MRN: [id number] Date of Birth: [birth year]  Subjective/Objective:                    Action/Plan: Patient discharging home with self care. No f/u per PT/OT. No further needs per CM.   Expected Discharge Date:  04/05/16               Expected Discharge Plan:  Home/Self Care  In-House Referral:     Discharge planning Services  CM Consult  Post Acute Care Choice:    Choice offered to:     DME Arranged:    DME Agency:     HH Arranged:    HH Agency:     Status of Service:  Completed, signed off  If discussed at [employer] of Stay Meetings, dates discussed:    Additional Comments:  Pollie Friar, RN 04/05/2016, 2:33 PM

## 2016-04-05 NOTE — Progress Notes (Signed)
Family Medicine Teaching Service Daily Progress Note Intern Pager: [phone]  Patient name: Jonathan [name] Medical record number: [id number] Date of birth: 11-19-1950 Age: 65 y.o. Gender: male  Primary Care Provider: Chrisandra Netters, MD Consultants: neurology, PT/OT, SLP Code Status: full  Pt Overview and Major Events to Date:  12/11- admitted to [facility] for acute ischemic stroke  Assessment and Plan: Jonathan [name] is a 65 y.o. male presenting with slurred speech. PMH is significant for HTN, bradycardia, tobacco abuse, OSA, HLD, T2DM  Acute ischemic stroke -MRA with no intracranial arterial occlusion or high-grade stenosis. -Carotid doppler with right sided stenosis 40-59% -Echo with EF [id number], grade 1 diastolic dysfunction. Mild LVH.  -SLP signed off, recommended outpatient follow up if patient felt deficits persisted -PT signed off, no needs -OT no follow up -continue to encouraged lifestyle modifications, including weight loss, smoking cessation -continue plavix -neuro following, appreciate recommendations  HTN- blood pressure 143/66 this morning -holding home medications, add back prior to d/c  Tobacco abuse -nicotine patch -continue encouraging smoking cessation  FEN/GI: heart healthy PPx: lovenox  Disposition: home likely today  Subjective:  Jonathan. [name] is doing well, he is excited to go home. No complaints.   Objective: Temp:  [97.7 F (36.5 C)-98.8 F (37.1 C)] 98.1 F (36.7 C) (12/12 0522) Pulse Rate:  [52-64] 54 (12/12 0522) Resp:  [18-20] 20 (12/12 0522) BP: (136-164)/(64-91) 143/66 (12/12 0522) SpO2:  [96 %-99 %] 98 % (12/12 0522) Physical Exam: General: elderly man laying in bed in NAD Cardiovascular: RRR no MRG Respiratory: CTA bilaterally. No increased work of breathing Abdomen: soft, non-tender, non-distended, +BS Extremities: no edema or cyanosis Neuro: no focal deficits, speech normal  Laboratory:  Recent Labs Lab 04/03/16 1804  04/03/16 1812 04/04/16 0515 04/05/16 0248  WBC 7.5  --  7.7 7.7  HGB 14.8 15.0 14.3 14.5  HCT 44.1 44.0 43.5 43.7  PLT 240  --  224 227    Recent Labs Lab 04/03/16 1804 04/03/16 1812 04/04/16 0515 04/05/16 0248  NA 136 138 139 137  K 4.3 4.3 4.5 4.4  CL 105 104 105 105  CO2 22  --  23 23  BUN 12 14 10 12   CREATININE 1.06 1.10 1.16 1.16  CALCIUM 8.9  --  8.7* 8.9  PROT 6.9  --   --   --   BILITOT <0.1*  --   --   --   ALKPHOS 95  --   --   --   ALT 29  --   --   --   AST 25  --   --   --   GLUCOSE 204* 205* 96 99    Imaging/Diagnostic Tests: Ct Head Wo Contrast  Result Date: 04/03/2016 CLINICAL DATA:  Slurred speech EXAM: CT HEAD WITHOUT CONTRAST TECHNIQUE: Contiguous axial images were obtained from the base of the skull through the vertex without intravenous contrast. COMPARISON:  None. FINDINGS: [name]: No findings to suggest acute hemorrhage, acute infarction or space-occupying mass lesion are noted. Lacunar infarct is noted in the left basal ganglia superiorly. No other focal abnormality is seen. Vascular: No hyperdense vessel or unexpected calcification. Skull: Normal. Negative for fracture or focal lesion. Sinuses/Orbits: No acute finding. Other: None. IMPRESSION: Chronic ischemic changes in the left basal ganglia. No acute abnormality noted. Electronically Signed   By: Inez Catalina M.D.   On: 04/03/2016 18:44   Jonathan [name] Head Wo Contrast  Result Date: 04/04/2016 CLINICAL DATA:  Slurred speech for 2  days EXAM: MRA HEAD WITHOUT CONTRAST TECHNIQUE: Angiographic images of the Circle of Willis were obtained using MRA technique without intravenous contrast. COMPARISON:  [name] MRI 04/03/2016 FINDINGS: Intracranial internal carotid arteries: Normal. Anterior cerebral arteries: Normal. Middle cerebral arteries: Normal. Posterior communicating arteries: Present bilaterally. Posterior cerebral arteries: Normal. Basilar artery: Normal. Vertebral arteries: Left dominant. Normal. Superior  cerebellar arteries: Normal. Anterior inferior cerebellar arteries: Visualized on the right. Posterior inferior cerebellar arteries: Normal. IMPRESSION: No intracranial arterial occlusion or high-grade stenosis. Electronically Signed   By: Ulyses Jarred M.D.   On: 04/04/2016 23:22   Jonathan [name] Wo Contrast  Result Date: 04/03/2016 CLINICAL DATA:  Initial evaluation for slurred speech. EXAM: MRI HEAD WITHOUT CONTRAST TECHNIQUE: Multiplanar, multiecho pulse sequences of the [name] and surrounding structures were obtained without intravenous contrast. COMPARISON:  Prior CT from earlier the same day. FINDINGS: [name]: Cerebral volume within normal limits for age. No significant cerebral white matter disease. There is an 18 mm curvilinear focus of restricted diffusion involving the left lentiform nucleus, extending into the left internal capsule, compatible with acute ischemic infarct. No associated hemorrhage or mass effect. No other evidence for acute ischemia. Gray-white matter differentiation otherwise maintained. No evidence for acute or chronic intracranial hemorrhage. No other areas of chronic infarction. No mass lesion, midline shift, or mass effect. No hydrocephalus. No extra-axial fluid collection. Major dural sinuses are patent. Pituitary gland within normal limits.  Midline structures intact. Vascular: Major intracranial vascular flow voids are maintained. Skull and upper cervical spine: Craniocervical junction normal. Visualized upper cervical spine demonstrates mild multilevel degenerative spondylolysis without significant stenosis. Bone marrow signal intensity normal. No scalp soft tissue abnormality. Sinuses/Orbits: Globes and orbital soft tissues within normal limits. Mild scattered mucosal thickening throughout the paranasal sinuses. No air-fluid level to suggest active sinus infection. Small bilateral mastoid effusions noted. Inner ear structures grossly normal. IMPRESSION: 1. 18 mm acute ischemic  nonhemorrhagic lacunar type infarction involving the left lentiform nucleus and internal capsule. 2. Otherwise normal [name] MRI. Electronically Signed   By: Jeannine Boga M.D.   On: 04/03/2016 23:29     Steve Rattler, DO 04/05/2016, 7:23 AM PGY-1, [facility] Intern pager: [phone], text pages welcome

## 2016-04-05 NOTE — Discharge Summary (Signed)
[facility] Discharge Summary  Patient name: Jonathan [name] Medical record number: [id number] Date of birth: 1950-06-03 Age: 65 y.o. Gender: male Date of Admission: 04/03/2016  Date of Discharge: 04/05/16 Admitting Physician: Lupita Dawn, MD  Primary Care Provider: Chrisandra Netters, MD Consultants: neurology, PT/OT, SLP  Indication for Hospitalization: stroke  Discharge Diagnoses/Problem List:  Acute ischemic stroke Dysarthria Hyperlipidemia Hypertension Elevated A1C Tobacco abuse  Disposition: home   Discharge Condition: stable, improved  Discharge Exam: see progress note from 04/05/16  Brief Hospital Course:  Jonathan [name] a 65 y.o. male with PMH of HTN, bradycardia, tobacco abuse, HLD, and pre-diabetes who presented to [facility] ED with slurred speech. Acute ischemic stroke was seen on MRI. He was admitted to [facility] for stroke management. Neurology saw patient and recommended starting him on Plavix. Carotid dopplers demonstrated right-sided stenosis of 40-59%. Echo demonstrated EF of [id number], grade 1 diastolic dysfunction, and mild LVH. SLP evaluated patient and recommended he see SLP as outpatient if deficits persist. PT/OT did not identify any patient needs. His dysarthria greatly improved during his hospital stay and he was near his normal speech function at time of discharge. He was encouraged to quit smoking in addition to making lifestyle changes to prevent future strokes. He was discharged home in stable condition on 04/05/16 with close follow up.   Issues for Follow Up:   1. Follow up with stroke clinic in 6 weeks 2. Follow up dysarthria, possible need for SLP outpatient follow up if patient feels he still has speech deficits 3. Encourage smoking cessation 4. Encourage weight loss 5. Follow up A1C outpatient in 3 months  Significant Procedures: none  Significant Labs and Imaging:   Recent Labs Lab 04/03/16 1804 04/03/16 1812  04/04/16 0515 04/05/16 0248  WBC 7.5  --  7.7 7.7  HGB 14.8 15.0 14.3 14.5  HCT 44.1 44.0 43.5 43.7  PLT 240  --  224 227    Recent Labs Lab 04/03/16 1804 04/03/16 1812 04/03/16 2045 04/04/16 0515 04/05/16 0248  NA 136 138  --  139 137  K 4.3 4.3  --  4.5 4.4  CL 105 104  --  105 105  CO2 22  --   --  23 23  GLUCOSE 204* 205*  --  96 99  BUN 12 14  --  10 12  CREATININE 1.06 1.10  --  1.16 1.16  CALCIUM 8.9  --   --  8.7* 8.9  MG  --   --  2.1  --   --   ALKPHOS 95  --   --   --   --   AST 25  --   --   --   --   ALT 29  --   --   --   --   ALBUMIN 4.1  --   --   --   --    Ct Head Wo Contrast  Result Date: 04/03/2016 CLINICAL DATA:  Slurred speech EXAM: CT HEAD WITHOUT CONTRAST TECHNIQUE: Contiguous axial images were obtained from the base of the skull through the vertex without intravenous contrast. COMPARISON:  None. FINDINGS: Brain: No findings to suggest acute hemorrhage, acute infarction or space-occupying mass lesion are noted. Lacunar infarct is noted in the left basal ganglia superiorly. No other focal abnormality is seen. Vascular: No hyperdense vessel or unexpected calcification. Skull: Normal. Negative for fracture or focal lesion. Sinuses/Orbits: No acute finding. Other: None. IMPRESSION: Chronic ischemic changes  in the left basal ganglia. No acute abnormality noted. Electronically Signed   By: Inez Catalina M.D.   On: 04/03/2016 18:44   Mr [name] Head Wo Contrast  Result Date: 04/04/2016 CLINICAL DATA:  Slurred speech for 2 days EXAM: MRA HEAD WITHOUT CONTRAST TECHNIQUE: Angiographic images of the Circle of Willis were obtained using MRA technique without intravenous contrast. COMPARISON:  Brain MRI 04/03/2016 FINDINGS: Intracranial internal carotid arteries: Normal. Anterior cerebral arteries: Normal. Middle cerebral arteries: Normal. Posterior communicating arteries: Present bilaterally. Posterior cerebral arteries: Normal. Basilar artery: Normal. Vertebral arteries:  Left dominant. Normal. Superior cerebellar arteries: Normal. Anterior inferior cerebellar arteries: Visualized on the right. Posterior inferior cerebellar arteries: Normal. IMPRESSION: No intracranial arterial occlusion or high-grade stenosis. Electronically Signed   By: Ulyses Jarred M.D.   On: 04/04/2016 23:22   Mr Brain Wo Contrast  Result Date: 04/03/2016 CLINICAL DATA:  Initial evaluation for slurred speech. EXAM: MRI HEAD WITHOUT CONTRAST TECHNIQUE: Multiplanar, multiecho pulse sequences of the brain and surrounding structures were obtained without intravenous contrast. COMPARISON:  Prior CT from earlier the same day. FINDINGS: Brain: Cerebral volume within normal limits for age. No significant cerebral white matter disease. There is an 18 mm curvilinear focus of restricted diffusion involving the left lentiform nucleus, extending into the left internal capsule, compatible with acute ischemic infarct. No associated hemorrhage or mass effect. No other evidence for acute ischemia. Gray-white matter differentiation otherwise maintained. No evidence for acute or chronic intracranial hemorrhage. No other areas of chronic infarction. No mass lesion, midline shift, or mass effect. No hydrocephalus. No extra-axial fluid collection. Major dural sinuses are patent. Pituitary gland within normal limits.  Midline structures intact. Vascular: Major intracranial vascular flow voids are maintained. Skull and upper cervical spine: Craniocervical junction normal. Visualized upper cervical spine demonstrates mild multilevel degenerative spondylolysis without significant stenosis. Bone marrow signal intensity normal. No scalp soft tissue abnormality. Sinuses/Orbits: Globes and orbital soft tissues within normal limits. Mild scattered mucosal thickening throughout the paranasal sinuses. No air-fluid level to suggest active sinus infection. Small bilateral mastoid effusions noted. Inner ear structures grossly normal.  IMPRESSION: 1. 18 mm acute ischemic nonhemorrhagic lacunar type infarction involving the left lentiform nucleus and internal capsule. 2. Otherwise normal brain MRI. Electronically Signed   By: Jeannine Boga M.D.   On: 04/03/2016 23:29   Echocardiogram- LVEF 60-65%, mild LVH, normal wall motion, diastolic dysfunction,   indeterminate LV filling pressure, normal LA size, normal IVC.  Carotid Dopplers-Findings suggest 40-59% right internal carotid artery stenosis and 1-39% left internal carotid artery stenosis. Vertebral arteries are patent with antegrade flow.  Results/Tests Pending at Time of Discharge: none  Discharge Medications:    Medication List    STOP taking these medications   aspirin EC 81 MG tablet     TAKE these medications   ALLEGRA PO Take 1 tablet by mouth daily.   amLODipine 10 MG tablet Commonly known as:  NORVASC TAKE ONE TABLET BY MOUTH DAILY.   atorvastatin 40 MG tablet Commonly known as:  LIPITOR TAKE ONE TABLET BY MOUTH DAILY.   clopidogrel 75 MG tablet Commonly known as:  PLAVIX Take 1 tablet (75 mg total) by mouth daily. Start taking on:  04/06/2016   fluticasone 50 MCG/ACT nasal spray Commonly known as:  FLONASE Place 1 spray into both nostrils daily.   lisinopril 5 MG tablet Commonly known as:  PRINIVIL,ZESTRIL TAKE ONE TABLET BY MOUTH DAILY.   triamterene-hydrochlorothiazide 37.5-25 MG tablet Commonly known as:  MAXZIDE-25 TAKE 1  TABLET BY MOUTH DAILY.   VENTOLIN HFA 108 (90 Base) MCG/ACT inhaler Generic drug:  albuterol INHALE 2 PUFFS BY MOUTH EVERY 4 HOURS AS NEEDED FOR WHEEZING OR SHORTNESS OF BREATH.       Discharge Instructions: Please refer to Patient Instructions section of EMR for full details.  Patient was counseled important signs and symptoms that should prompt return to medical care, changes in medications, dietary instructions, activity restrictions, and follow up appointments.   Follow-Up Appointments: Follow-up  Information    Georges Lynch, MD. Go on 04/11/2016.   Specialty:  Family Medicine Why:  at 10:30 am Contact information: [id number] [place] [address] [place], DO 04/05/2016, 1:22 PM PGY-1, [place]

## 2016-04-05 NOTE — Progress Notes (Signed)
STROKE TEAM PROGRESS NOTE   HISTORY OF PRESENT ILLNESS (per record) Jonathan [name] is an 65 y.o. male who presented to the ED for slurred speech x 2 days. There was no associated headache, weakness, gait imbalance, vision change or confusion. Speech comprehension and content were normal, with slurring being the sole symptom. Initial NIHSS was 2. MRI revealed an acute lacunar infarction. Patient was not administered IV t-PA secondary to delay in arrival. He was admitted for further evaluation and treatment.   SUBJECTIVE (INTERVAL HISTORY) No family is at the bedside.  Patient is stable and wants to go home. Overall he feels his condition is stable. Transthoracic echo is unremarkable. Carotid ultrasound shows mild right ICA stenosis but none on the left. Dysarthria improved today   OBJECTIVE Temp:  [97.9 F (36.6 C)-98.8 F (37.1 C)] 98.8 F (37.1 C) (12/12 0956) Pulse Rate:  [52-64] 57 (12/12 0956) Cardiac Rhythm: Normal sinus rhythm (12/11 1900) Resp:  [18-20] 20 (12/12 0956) BP: (143-164)/(64-82) 158/75 (12/12 0956) SpO2:  [96 %-98 %] 97 % (12/12 0956)  CBC:   Recent Labs Lab 04/03/16 1804  04/04/16 0515 04/05/16 0248  WBC 7.5  --  7.7 7.7  NEUTROABS 4.4  --   --   --   HGB 14.8  < > 14.3 14.5  HCT 44.1  < > 43.5 43.7  MCV 89.8  --  89.9 90.3  PLT 240  --  224 227  < > = values in this interval not displayed.  Basic Metabolic Panel:   Recent Labs Lab 04/03/16 2045 04/04/16 0515 04/05/16 0248  NA  --  139 137  K  --  4.5 4.4  CL  --  105 105  CO2  --  23 23  GLUCOSE  --  96 99  BUN  --  10 12  CREATININE  --  1.16 1.16  CALCIUM  --  8.7* 8.9  MG 2.1  --   --     Lipid Panel:     Component Value Date/Time   CHOL 150 04/04/2016 0515   TRIG 177 (H) 04/04/2016 0515   HDL 23 (L) 04/04/2016 0515   CHOLHDL 6.5 04/04/2016 0515   VLDL 35 04/04/2016 0515   LDLCALC 92 04/04/2016 0515   HgbA1c:  Lab Results  Component Value Date   HGBA1C 6.5 03/28/2016     IMAGING  Ct Head Wo Contrast 04/03/2016 Chronic ischemic changes in the left basal ganglia. No acute abnormality noted.  Mr Brain Wo Contrast 04/03/2016 1. 18 mm acute ischemic nonhemorrhagic lacunar type infarction involving the left lentiform nucleus and internal capsule. 2. Otherwise normal brain MRI. Electronically  Carotid Doppler   40-59% right internal carotid artery stenosis and 1-39% left internal carotid artery stenosis. Vertebral arteries are patent with antegrade flow.   PHYSICAL EXAM Pleasant middle-age Caucasian male currently not in distress. . Afebrile. Head is nontraumatic. Neck is supple without bruit.    Cardiac exam no murmur or gallop. Lungs are clear to auscultation. Distal pulses are well felt. Neurological Exam ;  Awake  Alert oriented x 3. Mild dysarthric speech and right lower facial weakness .eye movements full without nystagmus.fundi were not visualized. Vision acuity and fields appear normal. Hearing is normal. Palatal movements are normal. Face symmetric. Tongue midline. Normal strength, tone, reflexes and coordination. Normal sensation. Gait deferred.  ASSESSMENT/PLAN [name] is a 65 y.o. male with history of HTN, bradycardia, tobacco abuse, OSA, HLD and type II diabetes presenting with  slurred speech x 2 days. He did not receive IV t-PA due to delay in arrival.   Stroke:  L lentiform nucleus/internal capsule infarct secondary to small vessel disease    Resultant  dysarthria  MRI  L lentiform nucleus/internal capsule infarct  MRA  pending   Carotid Doppler  R 40-59% stenosis  2D Echo  pending   LDL 92  HgbA1c 6.5  Lovenox 30 mg sq daily for VTE prophylaxis Diet Heart Room service appropriate? Yes; Fluid consistency: Thin  aspirin 81 mg daily prior to admission, now on clopidogrel 75 mg daily. Continue plavix at discharge.   Patient counseled to be compliant with his antithrombotic medications  Ongoing aggressive stroke risk  factor management  Therapy recommendations:  No OT  Disposition:  pending   Hypertension  Stable  Long-term BP goal normotensive  Hyperlipidemia  Home meds:  lipitor 40, resumed in hospital  LDL 92, goal < 70  Continue statin at discharge  Diabetes type II  HgbA1c 6.5, goal < 7.0  Controlled  Other Stroke Risk Factors  Cigarette smoker, advised to stop smoking  Obesity, Body mass index is 32.34 kg/m., recommend weight loss, diet and exercise as appropriate   Obstructive sleep apnea, does not use CPAP at home  Hospital day # 1    I have personally examined this patient, reviewed notes, independently viewed imaging studies, participated in medical decision making and plan of care.ROS completed by me personally and pertinent positives fully documented  I have made any additions or clarifications directly to the above note. Agree with note above.  He presented with dysarthria and mild facial weakness due to left internal capsule infarct from small vessel disease. Continue Plavix for stroke prevention and patient counseled to quit smoking and is agreeable. Discharge home today. Follow-up as an outpatient in the stroke clinic in 6 weeks.. Greater than 50% time during this 25 minute visit was spent on counseling and coordination of care about stroke risk, prevention and treatment Antony Contras, MD Medical Director [place] Pager: [phone] 04/05/2016 12:54 PM  To contact Stroke Continuity provider, please refer to [email or web address] After hours, contact General Neurology

## 2016-04-05 NOTE — Progress Notes (Signed)
Patient is discharged from room 5C07 at this time. Alert and in stable condition. IV site d/c'd as well as tele. Instructions read to patient with understanding verbalized. Left unit via wheelchair with all belongings at side.

## 2016-04-05 NOTE — Progress Notes (Signed)
Transitions of Care Pharmacy Note  Plan:  Patient was discharged prior to education session.   Recommend the following during follow-up:  - Add additional lipid control - current LDL 92, goal < 70, addition of Zetia 10mg  (dec LDL ~18%) would move his lipids closer to being controlled   Follow-up with neurology if ASA should be continued at discharge for dual antiplatelet therapy, or if Plavix alone is sufficient --------------------------------------------- Jonathan [name] is an 65 y.o. male who presents with a chief complaint slurred speech for 2 days, diagnosed with an acute ischemic stroke. In anticipation of discharge, pharmacy has reviewed this patient's prior to admission medication history, as well as current inpatient medications listed per the [facility].  Patient was discharged prior to me being able to educate him about medication changes and disease states.   Time spent counseling patient: 20 minutes   Thank you for allowing pharmacy to be a part of this patient's care.  Belia Heman, PharmD PGY1 Pharmacy Resident [phone] (Pager) 04/05/2016 4:06 PM

## 2016-04-05 NOTE — Progress Notes (Signed)
F/u after colleague brought advanced directive form to pt yesterday. Pt has now decided not to execute it here, but after discharge today to discuss w/ his wife and complete later. Provided spiritual/emotional support and prayer. Pt cheerful and joking around with housekeeper.   04/05/16 1000  Clinical Encounter Type  Visited With Patient  Visit Type Follow-up;Psychological support;Spiritual support;Social support;Other (Comment)  Referral From [facility];Emotional;Other (Comment)  Stress Factors  Patient Stress Factors Health changes   Gerrit Heck, Chaplain

## 2016-04-06 ENCOUNTER — Telehealth: Payer: Self-pay | Admitting: *Deleted

## 2016-04-06 NOTE — Telephone Encounter (Signed)
As patient was just hospitalized with a stroke, will hold off on this for now & address it when he follows up. Leeanne Rio, MD

## 2016-04-06 NOTE — Telephone Encounter (Signed)
Transitional Care Clinic Post-discharge Follow-Up Phone Call:  Date of Discharge: 04/05/2016 Principal Discharge Diagnosis(es): Stroke Post-discharge Communication: Attempt #1 to reach patient and complete post-discharge follow-up phone call. Call placed to [phone]; unable to reach patient. HIPPA compliant voicemail left requesting return call. Call Completed: No                      Hubbard Hartshorn, RN, BSN

## 2016-04-07 NOTE — Telephone Encounter (Signed)
Transitional Care Post-Discharge Follow-Up Phone Call:   Admit date: 04/03/2016 Discharge date: 04/05/2016  Discharge Disposition: Home  Best patient contact number: [phone] Emergency contact(s): Wife, Truett Mainland PCP: Ardelia Mems  Principal Discharge Diagnosis: Stroke  Reason for Chronic Case Management: Co-morbidities as follows:  HTN, HLD, OSA, Pre-diabetes, Tobacco abuse, 2 ED and 1 admission in 6 months   Post-discharge Communication: (Clearly document all attempts clearly and date contact made)   Call Completed: Yes, with patient   Interpreter Needed: No   Please check all that apply:  X Patient is caring for self at home.  ? Patient has caregiver. If so, name and best contact number:  X Patient is knowledgeable of his/her condition(s) and/or treatment.  ? Family and/or caregiver is knowledgeable of patient's condition(s) and/or treatment.   Medication Reconciliation:  ? Medication list reviewed with patient.  X Patient has all discharge medications (Ability to afford, access, adherence, pharmacy) Patient states he has all his discharge meds. States he's able to afford them and pick them up at pharmacy X Patient has O2/CPAP ordered? If so, name of company that supplies: CPAP from [facility]. Patient states he's not using regularly. Encouraged to use as prescribed for best heart health. Patient states understanding.  Activities of Daily Living:  X Independent  ? Needs assist (describe)  ? Total Care (describe)   Community resources in place for patient:  X None  ? Home Health If so, name of agency: ? [facility] If so, name of Care Manager and contact number:   ? Assisted Living  ? Hospice  ? Support Group    Topics discussed:  Home Environment: Patient lives in mobile home with wife. There are 5 steps in back and 6 in front, both with hand rails.  Support System: Wife Helene Kelp) and children Lenna Sciara and Ferne Coe)  Home DME: Ordered at discharge? Does patient have?  None  Transportation: Barriers? Patient states he has his own car and denies transportation issues.  Food/Nutrition: (ability to afford, access, use of any community resources) Denies food insecurity  TOC appointment scheduled for: 04/15/2016 at 1515 with Dr. Gerlean Ren (part of in-patient team as PCP unavailable)  Identified Barriers: Patient smoking. States he's trying to quit. States he needs to pick up more patches. Given info on 1-800-QUIT-NOW  Patient Education: Discussed importance of wearing CPAP as prescribed for best heart health as well as need to quit smoking. Patient states understanding.  Questions/concerns: None at this time. States he's "doing ok" and "feeling alright."              Hubbard Hartshorn, RN, BSN

## 2016-04-08 ENCOUNTER — Encounter: Payer: Self-pay | Admitting: Family Medicine

## 2016-04-11 ENCOUNTER — Inpatient Hospital Stay: Payer: Self-pay | Admitting: Family Medicine

## 2016-04-12 NOTE — Progress Notes (Signed)
Late entry - patient was present during his wife's visit on 04/08/16. He reported that the day prior he began using a 21mg  nicotine patch (had been using patches in the hospital). That night after putting on the patch, began to feel nauseated. Reviewed chart, which showed that he was on the 14mg  nicotine patches in the hospital. Recommended he switch to these patches. Patient appreciative.  Leeanne Rio, MD

## 2016-04-14 ENCOUNTER — Ambulatory Visit (INDEPENDENT_AMBULATORY_CARE_PROVIDER_SITE_OTHER): Payer: Medicare Other | Admitting: Obstetrics and Gynecology

## 2016-04-14 ENCOUNTER — Encounter: Payer: Self-pay | Admitting: Obstetrics and Gynecology

## 2016-04-14 VITALS — BP 150/72 | HR 67 | Temp 97.9°F | Wt 202.4 lb

## 2016-04-14 DIAGNOSIS — I10 Essential (primary) hypertension: Secondary | ICD-10-CM | POA: Diagnosis not present

## 2016-04-14 DIAGNOSIS — I639 Cerebral infarction, unspecified: Secondary | ICD-10-CM | POA: Diagnosis not present

## 2016-04-14 DIAGNOSIS — Z09 Encounter for follow-up examination after completed treatment for conditions other than malignant neoplasm: Secondary | ICD-10-CM | POA: Diagnosis not present

## 2016-04-14 DIAGNOSIS — F17200 Nicotine dependence, unspecified, uncomplicated: Secondary | ICD-10-CM

## 2016-04-14 NOTE — Assessment & Plan Note (Signed)
Again encouraged tobacco cessation. Has reduced amount to 1 cig a day from 1ppd. Continues to use nicotine patches.

## 2016-04-14 NOTE — Assessment & Plan Note (Signed)
Has been well-controlled in past on current regimen. Goal BP would be <140/90. Patient mildly elevated today. Continue current regimen. No adjustments at this time. Follow-up with PCP.

## 2016-04-14 NOTE — Assessment & Plan Note (Signed)
Recent diagnosis. Here for hospital follow-up. Continue risk factor modification. Continue medications to include statin and plavix. Will need stroke team follow-up in 4 weeks.

## 2016-04-14 NOTE — Patient Instructions (Addendum)
Doing well since hospital discharge No additional work-up needed Continue all medications Keep all appointments. Follow-up in stroke clinic in 6 weeks.   Follow-up with PCP in 2 months with PCP for diabetes management  Happy Holidays!

## 2016-04-14 NOTE — Progress Notes (Signed)
TRANSITION OF CARE VISIT   Primary Care Physician (PCP): Chrisandra Netters, MD                                                    [facility] [address]                                                   [place], [place] [address]                                                   [place]  Date of Admission: 04/03/2016  Date of Discharge: 04/05/2016   Discharged from: [facility]  Discharge Diagnosis: Stroke  Summary of Admission:  65 y.o. male with PMH of HTN, bradycardia, tobacco abuse, HLD, and pre-diabetes who presented to [facility] ED with slurred speech. Acute ischemic stroke was seen on MRI. Neurology saw patient and recommended starting him on Plavix. SLP evaluated patient and recommended he see SLP as outpatient if deficits persist. PT/OT did not identify any patient needs. His dysarthria greatly improved during his hospital stay and he was near his normal speech function at time of discharge.   TODAY's VISIT  Patient/Caregiver self-reported problems/concerns: None  Doing well since discharge. Compliant with all medications. Does not have any lasting deficits. States he feels back to normal. Denies any weakness, dysarthria, dyspnea, chest pain, headache, vision changes. Has not followed with neurology since discharge.   Tobacco - one cigarette a day from 1 ppd. Wearing patches as well.   MEDICATIONS  Current Outpatient Prescriptions on File Prior to Visit  Medication Sig Dispense Refill  . amLODipine (NORVASC) 10 MG tablet TAKE ONE TABLET BY MOUTH DAILY. 30 tablet 0  . atorvastatin (LIPITOR) 40 MG tablet TAKE ONE TABLET BY MOUTH DAILY. 30 tablet 0  . clopidogrel (PLAVIX) 75 MG tablet Take 1 tablet (75 mg total) by mouth daily. 30 tablet 2  . Fexofenadine HCl (ALLEGRA PO) Take 1 tablet by mouth daily.     . fluticasone (FLONASE) 50 MCG/ACT nasal spray Place 1  spray into both nostrils daily. 16 g 2  . lisinopril (PRINIVIL,ZESTRIL) 5 MG tablet TAKE ONE TABLET BY MOUTH DAILY. 30 tablet 0  . triamterene-hydrochlorothiazide (MAXZIDE-25) 37.5-25 MG tablet TAKE 1 TABLET BY MOUTH DAILY. 30 tablet 0  . VENTOLIN HFA 108 (90 Base) MCG/ACT inhaler INHALE 2 PUFFS BY MOUTH EVERY 4 HOURS AS NEEDED FOR WHEEZING OR SHORTNESS OF BREATH. 18 each 2   No current facility-administered medications on file prior to visit.     Medication Reconciliation conducted with patient/caregiver? (Yes/ No): Yes  New medications prescribed/discontinued upon discharge? (Yes/No): Yes, new medication Plavix prescribed at discharge.   Barriers identified related to medications: No  LABS  Lab Reviewed (Yes/No/NA): Yes  PHYSICAL EXAM:  BP (!) 148/84   Pulse 67   Temp 97.9 F (36.6 C) (Oral)   Wt 202 lb 6.4 oz (91.8 kg)   SpO2 95%   BMI 31.70 kg/m  General: elderly man in NAD Cardiovascular: RRR no MRG Respiratory: CTA bilaterally. No increased work of breathing Abdomen: soft, non-tender, non-distended, +BS Extremities: no edema or cyanosis Neuro: no focal deficits, speech normal, strength intact  ASSESSMENT: 65 y.o. male presenting after hospital discharge for stroke. Doing well. No neurological deficits identified. Does not need any PT/OT/SLP at this time.  Stroke risk factors: HTN, HLD, smoking and age Should have follow-up with stroke clinic in 4 weeks Counseled on tobacco cessation.   PATIENT EDUCATION PROVIDED: See AVS   FOLLOW-UP (Include any further testing or referrals): Follow-up with PCP in 2 months.   Luiz Blare, DO 04/14/2016, 10:33 AM PGY-3, [place]

## 2016-04-15 ENCOUNTER — Inpatient Hospital Stay: Payer: Self-pay | Admitting: Family Medicine

## 2016-04-21 ENCOUNTER — Telehealth: Payer: Self-pay | Admitting: *Deleted

## 2016-04-21 NOTE — Telephone Encounter (Signed)
Called patient to learn how he is feeling. Wife, Helene Kelp, states he is not at home. Helene Kelp states patient is "doing OK," is aware of appt with Neuro on 05/26/2016, is continuing to take all discharge meds including statin and plavix and continues to try to quit smoking; states "he hasn't smoked at all today."  Requesting refill on patient's "allergy pill."  Note routed to PCP for refill.  Hubbard Hartshorn, RN, BSN

## 2016-04-22 NOTE — Telephone Encounter (Signed)
Red team, please clarify with patient whether he's taking allegra or claritin. Allegra is currently on his medication list, but claritin is what I've prescribed for him previously. Once I know this I can send in a refill.  Thanks Leeanne Rio, MD

## 2016-04-22 NOTE — Telephone Encounter (Signed)
Attempted to call pt and wife was in the waiting room on the phone, she said he was on his way to pick her up. She states that the pt is on allegra. Paton Crum Kennon Holter, CMA

## 2016-04-27 MED ORDER — FEXOFENADINE HCL 180 MG PO TABS: 180.0000 mg | ORAL_TABLET | Freq: Every day | ORAL | 3 refills | Status: DC

## 2016-04-27 NOTE — Telephone Encounter (Signed)
Rx for allegra sent in. Leeanne Rio, MD

## 2016-04-27 NOTE — Addendum Note (Signed)
Addended by: Leeanne Rio on: 04/27/2016 09:33 AM   Modules accepted: Orders

## 2016-04-29 ENCOUNTER — Inpatient Hospital Stay: Payer: Self-pay | Admitting: Family Medicine

## 2016-05-26 ENCOUNTER — Ambulatory Visit: Payer: Self-pay | Admitting: Neurology

## 2016-05-26 ENCOUNTER — Telehealth: Payer: Self-pay | Admitting: Family Medicine

## 2016-05-26 NOTE — Telephone Encounter (Signed)
Wife states pt received a letter from insurance company saying pt's blood thinner has been recalled. Wife is very concerned and would like someone to call her. [phone]. ep

## 2016-05-30 ENCOUNTER — Encounter: Payer: Self-pay | Admitting: Neurology

## 2016-05-30 NOTE — Telephone Encounter (Signed)
Returned call to wife, she states she realized that patient's medication was not actually affected. No further issues.  Leeanne Rio, MD

## 2016-07-27 ENCOUNTER — Other Ambulatory Visit: Payer: Self-pay | Admitting: Family Medicine

## 2016-07-27 NOTE — Telephone Encounter (Signed)
Pt called and would like a refill on his Lipitor. jw

## 2016-07-28 MED ORDER — ATORVASTATIN CALCIUM 40 MG PO TABS: 40.0000 mg | ORAL_TABLET | Freq: Every day | ORAL | 2 refills | Status: DC

## 2016-08-25 ENCOUNTER — Encounter: Payer: Self-pay | Admitting: Family Medicine

## 2016-08-25 ENCOUNTER — Ambulatory Visit (INDEPENDENT_AMBULATORY_CARE_PROVIDER_SITE_OTHER): Payer: Medicare Other | Admitting: Family Medicine

## 2016-08-25 VITALS — BP 160/80 | HR 59 | Temp 98.2°F | Ht 67.0 in | Wt 204.2 lb

## 2016-08-25 DIAGNOSIS — I10 Essential (primary) hypertension: Secondary | ICD-10-CM | POA: Diagnosis not present

## 2016-08-25 DIAGNOSIS — F17200 Nicotine dependence, unspecified, uncomplicated: Secondary | ICD-10-CM

## 2016-08-25 DIAGNOSIS — E785 Hyperlipidemia, unspecified: Secondary | ICD-10-CM | POA: Diagnosis not present

## 2016-08-25 DIAGNOSIS — E119 Type 2 diabetes mellitus without complications: Secondary | ICD-10-CM

## 2016-08-25 DIAGNOSIS — I639 Cerebral infarction, unspecified: Secondary | ICD-10-CM | POA: Diagnosis not present

## 2016-08-25 LAB — POCT GLYCOSYLATED HEMOGLOBIN (HGB A1C): Hemoglobin A1C: 6.6

## 2016-08-25 MED ORDER — FLUTICASONE PROPIONATE 50 MCG/ACT NA SUSP: 1.0000 | Freq: Every day | NASAL | 2 refills | Status: DC

## 2016-08-25 MED ORDER — CLOPIDOGREL BISULFATE 75 MG PO TABS: 75.0000 mg | ORAL_TABLET | Freq: Every day | ORAL | 2 refills | Status: DC

## 2016-08-25 MED ORDER — TRIAMTERENE-HCTZ 37.5-25 MG PO TABS: 1.0000 | ORAL_TABLET | Freq: Every day | ORAL | 3 refills | Status: DC

## 2016-08-25 MED ORDER — LISINOPRIL 5 MG PO TABS: 5.0000 mg | ORAL_TABLET | Freq: Every day | ORAL | 3 refills | Status: DC

## 2016-08-25 MED ORDER — ATORVASTATIN CALCIUM 40 MG PO TABS: 40.0000 mg | ORAL_TABLET | Freq: Every day | ORAL | 2 refills | Status: DC

## 2016-08-25 MED ORDER — FEXOFENADINE HCL 180 MG PO TABS: 180.0000 mg | ORAL_TABLET | Freq: Every day | ORAL | 3 refills | Status: DC

## 2016-08-25 MED ORDER — AMLODIPINE BESYLATE 10 MG PO TABS: 10.0000 mg | ORAL_TABLET | Freq: Every day | ORAL | 1 refills | Status: DC

## 2016-08-25 NOTE — Patient Instructions (Signed)
Nice to meet you today. Your blood pressure is elevated today, but this is likely because you've been out of her medications for a week. I refilled your medications and send them to your pharmacy. Please follow-up with your primary care doctor in 2-4 weeks to see how your blood pressure is on your medications. Be sure to bring all your medications to that visit and all visits. We are getting some labs today and someone will call you or send you a letter with the results when they're available. I am also placing a referral to neurology. It is important to follow up with them after a stroke. Someone should call you about this appointment within the next 2 weeks. If you do not hear about this appointment, please give [place] a call.  Take care, Dr. Jacinto Reap

## 2016-08-25 NOTE — Assessment & Plan Note (Signed)
Was previously well-controlled on current regimen Patient has been out of medications for 1 week which is likely why his blood pressure is elevated today Goal blood pressure less than 140/90  continue current regimen Follow-up in one month to see how blood pressure control is on all medications

## 2016-08-25 NOTE — Assessment & Plan Note (Signed)
Encouraged tobacco cessation Currently pre-contemplative

## 2016-08-25 NOTE — Assessment & Plan Note (Signed)
Recheck A1c Up-to-date on foot exam Encouraged eye exam Pending A1c, may need medications

## 2016-08-25 NOTE — Assessment & Plan Note (Signed)
Tolerating statin Recheck lipid panel Refill of atorvastatin given

## 2016-08-25 NOTE — Assessment & Plan Note (Signed)
Patient needs neurology follow-up Referral placed Continue Plavix currently

## 2016-08-25 NOTE — Progress Notes (Signed)
   Subjective:   Jonathan [name] is a 66 y.o. male with a history of HTN, HLD, CVA, OSA, T2DM here for medication refill for chronic conditions  HTN: - Medications: lisinopril 5mg  daily, maxzide 25 daily, amlodipine 10mg  daily - Compliance: often misses 1-2 per week - ran out of all meds 1 wk ago - Checking BP at home: no - Denies any SOB, CP, vision changes, LE edema, medication SEs, or symptoms of hypotension - Diet: low sodium, no added salt - Exercise: none  HLD - medications: atorvastatin 40 mg daily - compliance: often misses 1-2 per week - ran out of all meds 1 wk ago - medication SEs: none  h/o embolic CVA - ran out of plavix 1 wk ago - never saw neurology in f/u after embolic stroke in 16/1096 - thinks he is at neuro baseline - denies vision changes, weakness/numbness, speech difficulties  T2DM - not on any medications - last A1c 6.5 - does not eat low carb - no formal exercise  Review of Systems:  Per HPI.   Social History: current smoker - 1 ppd  Objective:  BP (!) 160/80 (BP Location: Left Arm, Patient Position: Sitting, Cuff Size: Normal)   Pulse (!) 59   Temp 98.2 F (36.8 C) (Oral)   Ht 5\' 7"  (1.702 m)   Wt 204 lb 3.2 oz (92.6 kg)   SpO2 96%   BMI 31.98 kg/m   Gen:  66 y.o. male in NAD HEENT: NCAT, MMM, EOMI, PERRL, anicteric sclerae CV: RRR, no MRG Resp: Non-labored, CTAB, no wheezes noted Ext: WWP, no edema MSK: No obvious deformities, gait intact Neuro: Alert and oriented, speech normal, no focal deficits       Chemistry      Component Value Date/Time   NA 137 04/05/2016 0248   K 4.4 04/05/2016 0248   CL 105 04/05/2016 0248   CO2 23 04/05/2016 0248   BUN 12 04/05/2016 0248   CREATININE 1.16 04/05/2016 0248   CREATININE 1.22 10/31/2012 1117      Component Value Date/Time   CALCIUM 8.9 04/05/2016 0248   ALKPHOS 95 04/03/2016 1804   AST 25 04/03/2016 1804   ALT 29 04/03/2016 1804   BILITOT <0.1 (L) 04/03/2016 1804      Lab Results   Component Value Date   WBC 7.7 04/05/2016   HGB 14.5 04/05/2016   HCT 43.7 04/05/2016   MCV 90.3 04/05/2016   PLT 227 04/05/2016   Lab Results  Component Value Date   TSH 1.823 04/04/2016   Lab Results  Component Value Date   HGBA1C 6.5 03/28/2016   Assessment & Plan:     Jonathan [name] is a 66 y.o. male here for   Stroke, acute, embolic [facility]) Patient needs neurology follow-up Referral placed Continue Plavix currently  Essential hypertension Was previously well-controlled on current regimen Patient has been out of medications for 1 week which is likely why his blood pressure is elevated today Goal blood pressure less than 140/90  continue current regimen Follow-up in one month to see how blood pressure control is on all medications  Type 2 diabetes mellitus ([place]) Recheck A1c Up-to-date on foot exam Encouraged eye exam Pending A1c, may need medications  HLD (hyperlipidemia) Tolerating statin Recheck lipid panel Refill of atorvastatin given  Tobacco use disorder Encouraged tobacco cessation Currently pre-contemplative   Virginia Crews, MD MPH PGY-3,  [facility] 08/25/2016  11:59 AM

## 2016-08-26 LAB — CMP14+EGFR
A/G RATIO: 1.6 (ref 1.2–2.2)
ALBUMIN: 4.1 g/dL (ref 3.6–4.8)
ALT: 48 IU/L — ABNORMAL HIGH (ref 0–44)
AST: 25 IU/L (ref 0–40)
Alkaline Phosphatase: 120 IU/L — ABNORMAL HIGH (ref 39–117)
BUN / CREAT RATIO: 13 (ref 10–24)
BUN: 16 mg/dL (ref 8–27)
Bilirubin Total: 0.2 mg/dL (ref 0.0–1.2)
CALCIUM: 8.8 mg/dL (ref 8.6–10.2)
CO2: 21 mmol/L (ref 18–29)
CREATININE: 1.26 mg/dL (ref 0.76–1.27)
Chloride: 102 mmol/L (ref 96–106)
GFR calc Af Amer: 69 mL/min/{1.73_m2} (ref >59)
GFR, EST NON AFRICAN AMERICAN: 59 mL/min/{1.73_m2} — AB (ref >59)
GLOBULIN, TOTAL: 2.6 g/dL (ref 1.5–4.5)
Glucose: 90 mg/dL (ref 65–99)
POTASSIUM: 4.4 mmol/L (ref 3.5–5.2)
SODIUM: 140 mmol/L (ref 134–144)
Total Protein: 6.7 g/dL (ref 6.0–8.5)

## 2016-08-26 LAB — LIPID PANEL
Chol/HDL Ratio: 8.6 ratio — ABNORMAL HIGH (ref 0.0–5.0)
Cholesterol, Total: 171 mg/dL (ref 100–199)
HDL: 20 mg/dL — ABNORMAL LOW (ref >39)
Triglycerides: 442 mg/dL — ABNORMAL HIGH (ref 0–149)

## 2016-10-03 ENCOUNTER — Ambulatory Visit: Payer: Self-pay | Admitting: Diagnostic Neuroimaging

## 2016-10-04 ENCOUNTER — Encounter: Payer: Self-pay | Admitting: Diagnostic Neuroimaging

## 2016-10-17 ENCOUNTER — Other Ambulatory Visit: Payer: Self-pay | Admitting: *Deleted

## 2016-10-17 MED ORDER — ATORVASTATIN CALCIUM 40 MG PO TABS: 40.0000 mg | ORAL_TABLET | Freq: Every day | ORAL | 2 refills | Status: DC

## 2016-11-08 ENCOUNTER — Encounter: Payer: Self-pay | Admitting: Internal Medicine

## 2016-11-08 ENCOUNTER — Ambulatory Visit (INDEPENDENT_AMBULATORY_CARE_PROVIDER_SITE_OTHER): Payer: Medicare Other | Admitting: Internal Medicine

## 2016-11-08 DIAGNOSIS — H66001 Acute suppurative otitis media without spontaneous rupture of ear drum, right ear: Secondary | ICD-10-CM

## 2016-11-08 DIAGNOSIS — H6690 Otitis media, unspecified, unspecified ear: Secondary | ICD-10-CM | POA: Insufficient documentation

## 2016-11-08 MED ORDER — AMOXICILLIN 875 MG PO TABS: 875.0000 mg | ORAL_TABLET | Freq: Two times a day (BID) | ORAL | 0 refills | Status: DC

## 2016-11-08 NOTE — Progress Notes (Signed)
   [facility] Phone: [phone]  Subjective:  Jonathan [name] is a 66 year old male presenting to clinic with right ear drainage that started 5 days ago. He states the drainage is sometimes clear and sometimes yellow. The drainage also has an odor. He has been placing a cotton swab in his ear to help keep the drainage from coming out. He denies any fevers, chills, nausea, or vomiting. He endorses mild ear pain. He denies changes in hearing. He states he has had a lot of ear infections in the past. His last ear infection was 5 years ago.  ROS: See HPI for pertinent positives and negatives  Past Medical History- HTN, hx stroke, OSA, T2DM, HLD  Family history reviewed for today's visit. No changes.  Social history- patient is a current smoker  Objective: BP 140/76   Pulse 72   Temp 98.3 F (36.8 C) (Oral)   Ht 5\' 7"  (1.702 m)   Wt 197 lb (89.4 kg)   SpO2 98%   BMI 30.85 kg/m  Gen: NAD, alert, cooperative with exam HEENT: NCAT, EOMI, right TM erythematous with yellow discharge present in the ear canal. TM appears intact. Left TM normal.  Assessment/Plan: Acute otitis media, right: Unusual for a patient this age, but he states he has had many ear infections in adulthood. - Treat with Amoxicillin bid x 10 days - Return precautions discussed - Follow-up if no improvement   Hyman Bible, MD PGY-3

## 2016-11-08 NOTE — Patient Instructions (Signed)
It was so nice to meet you!  I have prescribed some antibiotics for your ear infection. Please take Amoxicillin 1 tablet twice a day for 10 days. If this is not getting better after the antibiotics, please come back to see [place]!  -Dr. Brett Albino

## 2016-11-08 NOTE — Assessment & Plan Note (Signed)
Unusual for a patient this age, but he states he has had many ear infections in adulthood. - Treat with Amoxicillin bid x 10 days - Return precautions discussed - Follow-up if no improvement

## 2016-11-14 ENCOUNTER — Other Ambulatory Visit: Payer: Self-pay | Admitting: *Deleted

## 2016-11-14 MED ORDER — AMLODIPINE BESYLATE 10 MG PO TABS: 10.0000 mg | ORAL_TABLET | Freq: Every day | ORAL | 1 refills | Status: DC

## 2016-11-14 MED ORDER — CLOPIDOGREL BISULFATE 75 MG PO TABS: 75.0000 mg | ORAL_TABLET | Freq: Every day | ORAL | 2 refills | Status: DC

## 2016-11-14 MED ORDER — LISINOPRIL 5 MG PO TABS: 5.0000 mg | ORAL_TABLET | Freq: Every day | ORAL | 1 refills | Status: DC

## 2016-11-14 MED ORDER — ATORVASTATIN CALCIUM 40 MG PO TABS: 40.0000 mg | ORAL_TABLET | Freq: Every day | ORAL | 1 refills | Status: DC

## 2016-11-14 NOTE — Telephone Encounter (Signed)
Patient requesting 90 day supply on all medications

## 2016-11-21 ENCOUNTER — Other Ambulatory Visit: Payer: Self-pay | Admitting: *Deleted

## 2016-11-21 MED ORDER — LISINOPRIL 5 MG PO TABS: 5.0000 mg | ORAL_TABLET | Freq: Every day | ORAL | 0 refills | Status: DC

## 2016-11-21 MED ORDER — ATORVASTATIN CALCIUM 40 MG PO TABS: 40.0000 mg | ORAL_TABLET | Freq: Every day | ORAL | 0 refills | Status: DC

## 2016-11-21 NOTE — Telephone Encounter (Signed)
Patient requesting 90 day supply.  Will route refill request to PCP.  Burna Forts, BSN, RN-BC

## 2016-11-23 ENCOUNTER — Ambulatory Visit (INDEPENDENT_AMBULATORY_CARE_PROVIDER_SITE_OTHER): Payer: Medicare Other | Admitting: Family Medicine

## 2016-11-23 ENCOUNTER — Ambulatory Visit: Payer: Self-pay | Admitting: Family Medicine

## 2016-11-23 ENCOUNTER — Encounter: Payer: Self-pay | Admitting: Family Medicine

## 2016-11-23 VITALS — BP 124/64 | Temp 98.4°F | Ht 66.5 in | Wt 186.6 lb

## 2016-11-23 DIAGNOSIS — H60391 Other infective otitis externa, right ear: Secondary | ICD-10-CM

## 2016-11-23 DIAGNOSIS — H6090 Unspecified otitis externa, unspecified ear: Secondary | ICD-10-CM | POA: Insufficient documentation

## 2016-11-23 MED ORDER — CIPROFLOXACIN HCL 500 MG PO TABS: 500.0000 mg | ORAL_TABLET | Freq: Two times a day (BID) | ORAL | 0 refills | Status: DC

## 2016-11-23 MED ORDER — NEOMYCIN-POLYMYXIN-HC 3.5-10000-1 OT SUSP: 3.0000 [drp] | Freq: Four times a day (QID) | OTIC | Status: DC

## 2016-11-23 NOTE — Progress Notes (Signed)
   Subjective:    Patient ID: TARRENCE [name] is a 66 y.o. male presenting with No chief complaint on file.  on 11/23/2016  HPI: Seen for otitis media on 7/17 for drainage in right ear. He took 10 days of Amoxil with no improvement. Notes no fever, chills, no pain in ear. No recent swimming. Notes long h/o problems with ears. Reports drainage is stinky.  Review of Systems  Constitutional: Negative for chills and fever.  Respiratory: Negative for shortness of breath.   Cardiovascular: Negative for leg swelling.  Gastrointestinal: Negative for abdominal pain, nausea and vomiting.      Objective:    Blood pressure 124/64, temperature 98.4 F (36.9 C), temperature source Oral, height 5' 6.5" (1.689 m), weight 186 lb 9.6 oz (84.6 kg).  Physical Exam  Constitutional: He appears well-developed and well-nourished. No distress.  HENT:  Head: Normocephalic and atraumatic.  Right Ear: There is drainage and tenderness. Tympanic membrane is erythematous.  Left Ear: Tympanic membrane, external ear and ear canal normal.  Right TM is poorly visualized due to canal swelling and white drainage.   Eyes: No scleral icterus.  Neck: Neck supple.  Cardiovascular: Normal rate.   Pulmonary/Chest: Effort normal.  Abdominal: Soft.  Musculoskeletal: He exhibits no edema.  Neurological: He is alert.  Skin: Skin is warm.  Psychiatric: He has a normal mood and affect.  Vitals reviewed.       Assessment & Plan:   Problem List Items Addressed This Visit      Unprioritized   Otitis externa - Primary    Will try Cipro with gram negative coverage and additional ear gtts. If no improvement-->will need ENT referral      Relevant Medications   ciprofloxacin (CIPRO) 500 MG tablet   neomycin-polymyxin-hydrocortisone (CORTISPORIN) OTIC (EAR) suspension 3 drop (Start on 11/23/2016  6:00 PM)      Total face-to-face time with patient: 15 minutes. Over 50% of encounter was spent on counseling and coordination  of care. Return in about 3 months (around 02/23/2017) for a follow-up with PCP.  Donnamae [name] 11/23/2016 2:31 PM

## 2016-11-23 NOTE — Assessment & Plan Note (Signed)
Will try Cipro with gram negative coverage and additional ear gtts. If no improvement-->will need ENT referral

## 2016-11-23 NOTE — Patient Instructions (Signed)
Ear Drainage  Ear drainage is discharge of ear wax, pus, blood, or other fluids from the ear.  Follow these instructions at home:  Pay attention to any changes in your ear drainage. Take these actions to help with your condition:  ? Take over-the-counter and prescription medicines only as told by your health care provider.  ? Do not use cotton-tipped swabs in your ear. Do not put any other objects in your ear.  ? Do not swim until your health care provider has approved.  ? Before you shower, cover a cotton ball with petroleum jelly and place that in your ear. This helps to keep water out.  ? Avoid any exposure to smoke.  ? Wash your hands before and after you touch your ears.  ? Keep all follow-up visits as told by your health care provider. This is important.    Contact a health care provider if:  ? You have increased drainage.  ? You have ear pain.  ? You have a fever.  ? Your drainage is not getting better with treatment.  ? Your ear drainage is bloody, white, clear, or yellow.  ? Your ear is red or swollen.  Get help right away if:  ? You have severe ear pain.  ? You have a severe headache.  ? You vomit.  ? You feel dizzy.  ? You have a seizure.  ? You have new hearing loss.  This information is not intended to replace advice given to you by your health care provider. Make sure you discuss any questions you have with your health care provider.  Document Released: 04/11/2005 Document Revised: 09/17/2015 Document Reviewed: 07/15/2014  Elsevier Interactive Patient Education ? 2018 [employer]vier Inc[employer]

## 2016-11-25 ENCOUNTER — Telehealth: Payer: Self-pay

## 2016-11-25 NOTE — Telephone Encounter (Signed)
Returning call.

## 2016-11-25 NOTE — Addendum Note (Signed)
Addended by: Vernie Shanks E on: 11/25/2016 01:05 PM   Modules accepted: Orders

## 2016-11-25 NOTE — Telephone Encounter (Signed)
Pre visit call made to patient. Left message for return call. 

## 2016-11-27 NOTE — Progress Notes (Signed)
[place] at [facility] [address] [place], [place] [address] [phone]  Date:  11/28/2016   Name:  Jonathan [name]   DOB:  1951/01/17   MRN:  [id number]  PCP:  Darreld Mclean, MD    Chief Complaint: Establish Care (Pt here to est care. )   History of Present Illness:  Jonathan [name] is a 66 y.o. very pleasant male patient who presents with the following:  Here today as a new patient to my clinic.  He was admitted with a stroke in December, as follows: Discharge Diagnoses/Problem List:  Acute ischemic stroke Dysarthria Hyperlipidemia Hypertension Elevated A1C Tobacco abuse  Disposition: home   Discharge Condition: stable, improved  Discharge Exam: see progress note from 04/05/16  Brief [name] Course:  Jonathan [name] a 66 y.o. male with PMH of HTN, bradycardia, tobacco abuse, HLD, and pre-diabetes who presented to [facility] ED with slurred speech. Acute ischemic stroke was seen on MRI. He was admitted to [name] for stroke management. Neurology saw patient and recommended starting him on Plavix. Carotid dopplers demonstrated right-sided stenosis of 40-59%. Echo demonstrated EF of 40-81%, grade 1 diastolic dysfunction, and mild LVH. SLP evaluated patient and recommended he see SLP as outpatient if deficits persist. PT/OT did not identify any patient needs. His dysarthria greatly improved during his [name] stay and he was near his normal speech function at time of discharge. He was encouraged to quit smoking in addition to making lifestyle changes to prevent future strokes. He was discharged home in stable condition on 04/05/16 with close follow up.   He has recovered fully from his stroke except his wife note that he still sleeps a lot He is still smoking- is not currently interested in quitting  He is on plavix He is also taking amlodipine, lipitor, lisinopril, maxzide  He has had some right OM recently- he  was treated with amox- sx persisted and he was changed to cipro- he stared on this about 5 days ago. He has a 14 day supply and is taking this faithfully Hearing seems ok, he is just not sure if his ear is going to get back to normal on its own  He has never had colon cancer screening He would like to do cologuard after discussion of options  Recent A1c showed good control  BP Readings from Last 3 Encounters:  11/28/16 140/72  11/23/16 124/64  11/08/16 140/76     Lab Results  Component Value Date   HGBA1C 6.6 08/25/2016    Patient Active Problem List   Diagnosis Date Noted  . Otitis externa 11/23/2016  . Stroke, acute, embolic ([place]) 44/81/8563  . Type 2 diabetes mellitus ([place]) 04/01/2016  . Epigastric hernia 12/01/2015  . Allergic rhinitis 10/31/2012  . HLD (hyperlipidemia) 09/07/2011  . OSA (obstructive sleep apnea) 06/17/2011  . Insomnia 05/30/2011  . Malaise 04/01/2011  . Hearing loss 01/17/2011  . Knee pain, bilateral 11/18/2010  . H/O: asbestos exposure 11/18/2010  . Inguinal hernia, right 11/07/2010  . Essential hypertension 08/17/2010  . Bradycardia 08/17/2010  . Tobacco use disorder 08/17/2010    Past Medical History:  Diagnosis Date  . Cataract   . COPD (chronic obstructive pulmonary disease) ([place])   . Depression   . Diabetes mellitus without complication ([place])   . HTN (hypertension)   . Hyperlipidemia   . Sleep apnea   . Stroke [facility])     Past Surgical History:  Procedure Laterality Date  . CIRCUMCISION    . none      Social History  Substance Use Topics  . Smoking status: Current Every Day Smoker    Packs/day: 0.50    Years: 43.00    Types: Cigarettes  . Smokeless tobacco: Never Used     Comment: pt is trying to aggressively cut back on # cigs smoked daily.   . Alcohol use No    Family History  Problem Relation Age of Onset  . Cancer Mother   . Heart disease Father     No Known Allergies  Medication list has been reviewed and  updated.  Current Outpatient Prescriptions on File Prior to Visit  Medication Sig Dispense Refill  . amLODipine (NORVASC) 10 MG tablet Take 1 tablet (10 mg total) by mouth daily. 30 tablet 1  . atorvastatin (LIPITOR) 40 MG tablet Take 1 tablet (40 mg total) by mouth daily. 90 tablet 0  . ciprofloxacin (CIPRO) 500 MG tablet Take 1 tablet (500 mg total) by mouth 2 (two) times daily. 28 tablet 0  . clopidogrel (PLAVIX) 75 MG tablet Take 1 tablet (75 mg total) by mouth daily. 30 tablet 2  . fexofenadine (ALLEGRA ALLERGY) 180 MG tablet Take 1 tablet (180 mg total) by mouth daily. 30 tablet 3  . fluticasone (FLONASE) 50 MCG/ACT nasal spray Place 1 spray into both nostrils daily. 16 g 2  . lisinopril (PRINIVIL,ZESTRIL) 5 MG tablet Take 1 tablet (5 mg total) by mouth daily. 90 tablet 0  . triamterene-hydrochlorothiazide (MAXZIDE-25) 37.5-25 MG tablet Take 1 tablet by mouth daily. 30 tablet 3  . VENTOLIN HFA 108 (90 Base) MCG/ACT inhaler INHALE 2 PUFFS BY MOUTH EVERY 4 HOURS AS NEEDED FOR WHEEZING OR SHORTNESS OF BREATH. 18 each 2   Current Facility-Administered Medications on File Prior to Visit  Medication Dose Route Frequency Provider Last Rate Last Dose  . neomycin-polymyxin-hydrocortisone (CORTISPORIN) OTIC (EAR) suspension 3 drop  3 drop Right EAR Q6H Donnamae Jude, MD        Review of Systems:  As per HPI- otherwise negative.   Physical Examination: Vitals:   11/28/16 1420  BP: 140/72  Pulse: 67  Temp: 98.9 F (37.2 C)   Vitals:   11/28/16 1420  Weight: 198 lb 6.4 oz (90 kg)  Height: 5' 6.5" (1.689 m)   Body mass index is 31.54 kg/m. Ideal Body Weight: Weight in (lb) to have BMI = 25: 156.9  GEN: WDWN, NAD, Non-toxic, A & O x 3, overweight, looks well except for tobacco odor HEENT: Atraumatic, Normocephalic. Neck supple. No masses, No LAD.  PEER, EOMI, oropharynx wnl  Ears and Nose: No external deformity. CV: RRR, No M/G/R. No JVD. No thrill. No extra heart sounds. PULM:  CTA B, no wheezes, crackles, rhonchi. No retractions. No resp. distress. No accessory muscle use. ABD: S, NT, ND EXTR: No c/c/e NEURO Normal gait.  PSYCH: Normally interactive. Conversant. Not depressed or anxious appearing.  Calm demeanor.  Right ear: Tm is no longer acutely red, but the ear canal does show some debris  Assessment and Plan: Essential hypertension - Plan: amLODipine (NORVASC) 10 MG tablet  Other infective chronic otitis externa of right ear  Hyperlipidemia, unspecified hyperlipidemia type  Stroke, acute, embolic ([facility])  Type 2 diabetes mellitus without complication, without long-term current use of insulin ([place]) - Plan: Basic metabolic panel, Hemoglobin A1c  Tobacco use disorder  Colon cancer screening  Encounter for hepatitis C screening test for low risk  patient - Plan: Hepatitis C antibody  Here today to establish care He needs a refill of his norvasc- did today Ordered cologuard and other labs for him as above Encouraged him to quit smoking  Will plan further follow- up pending labs.  I think your ear is on the right track- finish out your cipro, and please let me know if your symptoms do not continue to get better. I am glad to have you see an ENT if needed We will get you set up for the cologuard test to screen for colon cancer Your BP looks ok today- I refilled your amlodopine (one of your BP meds) Please think about quitting smoking!   I will be in touch with your labs- please plan to see me in about 4 months and take care Signed Lamar Blinks, MD

## 2016-11-28 ENCOUNTER — Ambulatory Visit (INDEPENDENT_AMBULATORY_CARE_PROVIDER_SITE_OTHER): Payer: Medicare Other | Admitting: Family Medicine

## 2016-11-28 VITALS — BP 140/72 | HR 67 | Temp 98.9°F | Ht 66.5 in | Wt 198.4 lb

## 2016-11-28 DIAGNOSIS — F17200 Nicotine dependence, unspecified, uncomplicated: Secondary | ICD-10-CM

## 2016-11-28 DIAGNOSIS — E119 Type 2 diabetes mellitus without complications: Secondary | ICD-10-CM

## 2016-11-28 DIAGNOSIS — E785 Hyperlipidemia, unspecified: Secondary | ICD-10-CM | POA: Diagnosis not present

## 2016-11-28 DIAGNOSIS — Z1211 Encounter for screening for malignant neoplasm of colon: Secondary | ICD-10-CM

## 2016-11-28 DIAGNOSIS — H60391 Other infective otitis externa, right ear: Secondary | ICD-10-CM | POA: Diagnosis not present

## 2016-11-28 DIAGNOSIS — I10 Essential (primary) hypertension: Secondary | ICD-10-CM

## 2016-11-28 DIAGNOSIS — Z1159 Encounter for screening for other viral diseases: Secondary | ICD-10-CM

## 2016-11-28 DIAGNOSIS — I639 Cerebral infarction, unspecified: Secondary | ICD-10-CM | POA: Diagnosis not present

## 2016-11-28 MED ORDER — AMLODIPINE BESYLATE 10 MG PO TABS: 10.0000 mg | ORAL_TABLET | Freq: Every day | ORAL | 3 refills | Status: DC

## 2016-11-28 NOTE — Patient Instructions (Signed)
I think your ear is on the right track- finish out your cipro, and please let me know if your symptoms do not continue to get better. I am glad to have you see an ENT if needed We will get you set up for the cologuard test to screen for colon cancer Your BP looks ok today- I refilled your amlodopine (one of your BP meds) Please think about quitting smoking!   I will be in touch with your labs- please plan to see me in about 4 months and take care

## 2016-11-29 ENCOUNTER — Encounter: Payer: Self-pay | Admitting: Family Medicine

## 2016-11-29 LAB — HEPATITIS C ANTIBODY: HCV Ab: NONREACTIVE

## 2016-11-29 LAB — BASIC METABOLIC PANEL
BUN: 23 mg/dL (ref 6–23)
CALCIUM: 9.2 mg/dL (ref 8.4–10.5)
CO2: 26 mEq/L (ref 19–32)
CREATININE: 1.26 mg/dL (ref 0.40–1.50)
Chloride: 104 mEq/L (ref 96–112)
GFR: 60.94 mL/min (ref >60.00)
GLUCOSE: 108 mg/dL — AB (ref 70–99)
Potassium: 3.9 mEq/L (ref 3.5–5.1)
SODIUM: 138 meq/L (ref 135–145)

## 2016-11-29 LAB — HEMOGLOBIN A1C: Hgb A1c MFr Bld: 7.1 % — ABNORMAL HIGH (ref 4.6–6.5)

## 2017-01-04 ENCOUNTER — Other Ambulatory Visit: Payer: Self-pay | Admitting: Family Medicine

## 2017-01-06 ENCOUNTER — Telehealth: Payer: Self-pay | Admitting: Family Medicine

## 2017-01-06 NOTE — Telephone Encounter (Addendum)
Caller name: [facility] Call back number: [phone] fax # [phone]  reference #  [phone]   Reason for call:  Pharmacy requesting lidocaine, patient confirmed he would like lidocaine from this drug company, please advise

## 2017-01-06 NOTE — Telephone Encounter (Signed)
I cannot make that decision; please inquire with doc-of-day, thanks/SLS 09/14

## 2017-01-11 ENCOUNTER — Telehealth: Payer: Self-pay | Admitting: Emergency Medicine

## 2017-01-11 NOTE — Telephone Encounter (Signed)
Called [facility]- a mail order pharmacy located in [place]- about this pt.  I do not have him taking any of the requested medications.  It seems that he was reached by a solicitation company of some sort.  In any case he has not followed up with [facility] about these prescriptions so they are no longer active, I will close this encounter at this time

## 2017-01-11 NOTE — Telephone Encounter (Signed)
Received fax from [employer] request meds that are not listed on pt's med list. Requesting the following meds:  - Calcipotriene 0.005% Cream, Dispense: 360 grams, SIG: Apply 1-2 grams topically to affected area 1-2 times per day. Do Not Apply to Face.  -Fluocinonide 0.1% Cream, Dispense: 120 grams, SIG: Apply 1-2 grams topically to affected area 1-2 times per day.  -Lidocaine 5% Ointment, Dispense: 1063.20 grams, SIG: Apply 2-3 grams topically to affected area 3-4 times per day  -Omega-3 Acid Ethy Esters Capsules 1 gm, Dispense: 360 capsules, SIG: Take 2 capsules by mouth in the morning, and 2 capsules by mouth in the evening.    Please advise.

## 2017-01-25 NOTE — Progress Notes (Deleted)
[place] at [facility] [address], [place], [place] [address] [phone]  Date:  01/26/2017   Name:  Jonathan [name]   DOB:  [birth year]   MRN:  [id number]  PCP:  Darreld Mclean, MD    Chief Complaint: No chief complaint on file.   History of Present Illness:  Jonathan [name] is a 66 y.o. very pleasant male patient who presents with the following:  Suffered a stroke in 12/17.  I saw him in August with complaint of persistent ear infection: He has had some right OM recently- he was treated with amox- sx persisted and he was changed to cipro- he stared on this about 5 days ago. He has a 14 day supply and is taking this faithfully Hearing seems ok, he is just not sure if his ear is going to get back to normal on its own  Lab Results  Component Value Date   HGBA1C 7.1 (H) 11/28/2016   Flu shot: prevnar 13: Eye exam:  Patient Active Problem List   Diagnosis Date Noted  . Otitis externa 11/23/2016  . Stroke, acute, embolic ([place]) 77/41/2878  . Type 2 diabetes mellitus ([place]) 04/01/2016  . Epigastric hernia 12/01/2015  . Allergic rhinitis 10/31/2012  . HLD (hyperlipidemia) 09/07/2011  . OSA (obstructive sleep apnea) 06/17/2011  . Insomnia 05/30/2011  . Malaise 04/01/2011  . Hearing loss 01/17/2011  . Knee pain, bilateral 11/18/2010  . H/O: asbestos exposure 11/18/2010  . Inguinal hernia, right 11/07/2010  . Essential hypertension 08/17/2010  . Bradycardia 08/17/2010  . Tobacco use disorder 08/17/2010    Past Medical History:  Diagnosis Date  . Cataract   . COPD (chronic obstructive pulmonary disease) ([place])   . Depression   . Diabetes mellitus without complication ([place])   . HTN (hypertension)   . Hyperlipidemia   . Sleep apnea   . Stroke [facility])     Past Surgical History:  Procedure Laterality Date  . CIRCUMCISION    . none      Social History  Substance Use Topics  . Smoking status: Current Every Day  Smoker    Packs/day: 0.50    Years: 43.00    Types: Cigarettes  . Smokeless tobacco: Never Used     Comment: pt is trying to aggressively cut back on # cigs smoked daily.   . Alcohol use No    Family History  Problem Relation Age of Onset  . Cancer Mother   . Heart disease Father     No Known Allergies  Medication list has been reviewed and updated.  Current Outpatient Prescriptions on File Prior to Visit  Medication Sig Dispense Refill  . amLODipine (NORVASC) 10 MG tablet Take 1 tablet (10 mg total) by mouth daily. 90 tablet 3  . atorvastatin (LIPITOR) 40 MG tablet TAKE 1 TABLET BY MOUTH ONCE DAILY 90 tablet 3  . ciprofloxacin (CIPRO) 500 MG tablet Take 1 tablet (500 mg total) by mouth 2 (two) times daily. 28 tablet 0  . clopidogrel (PLAVIX) 75 MG tablet Take 1 tablet (75 mg total) by mouth daily. 30 tablet 2  . fexofenadine (ALLEGRA ALLERGY) 180 MG tablet Take 1 tablet (180 mg total) by mouth daily. 30 tablet 3  . fluticasone (FLONASE) 50 MCG/ACT nasal spray Place 1 spray into both nostrils daily. 16 g 2  . lisinopril (PRINIVIL,ZESTRIL) 5 MG tablet Take 1 tablet (5 mg total) by mouth daily. 90 tablet 0  .  triamterene-hydrochlorothiazide (MAXZIDE-25) 37.5-25 MG tablet Take 1 tablet by mouth daily. 30 tablet 3  . VENTOLIN HFA 108 (90 Base) MCG/ACT inhaler INHALE 2 PUFFS BY MOUTH EVERY 4 HOURS AS NEEDED FOR WHEEZING OR SHORTNESS OF BREATH. 18 each 2   Current Facility-Administered Medications on File Prior to Visit  Medication Dose Route Frequency Provider Last Rate Last Dose  . neomycin-polymyxin-hydrocortisone (CORTISPORIN) OTIC (EAR) suspension 3 drop  3 drop Right EAR Q6H Donnamae Jude, MD        Review of Systems:  As per HPI- otherwise negative.   Physical Examination: There were no vitals filed for this visit. There were no vitals filed for this visit. There is no height or weight on file to calculate BMI. Ideal Body Weight:    GEN: WDWN, NAD, Non-toxic, A & O x  3 HEENT: Atraumatic, Normocephalic. Neck supple. No masses, No LAD. Ears and Nose: No external deformity. CV: RRR, No M/G/R. No JVD. No thrill. No extra heart sounds. PULM: CTA B, no wheezes, crackles, rhonchi. No retractions. No resp. distress. No accessory muscle use. ABD: S, NT, ND, +BS. No rebound. No HSM. EXTR: No c/c/e NEURO Normal gait.  PSYCH: Normally interactive. Conversant. Not depressed or anxious appearing.  Calm demeanor.    Assessment and Plan: ***  Signed Lamar Blinks, MD

## 2017-01-26 ENCOUNTER — Ambulatory Visit: Payer: Self-pay | Admitting: Family Medicine

## 2017-01-26 DIAGNOSIS — Z0289 Encounter for other administrative examinations: Secondary | ICD-10-CM

## 2017-01-31 ENCOUNTER — Telehealth: Payer: Self-pay | Admitting: *Deleted

## 2017-01-31 NOTE — Telephone Encounter (Signed)
Received Cologuard Order Cancellation: [id number]; order has not been completed, [employer] has made several attempts to reach patient; forwarded to provider/SLS 101/09

## 2017-02-28 NOTE — Progress Notes (Signed)
[place] at [facility] [place], [place], [place] [address] [phone]  Date:  03/02/2017   Name:  Jonathan [name]   DOB:  1950-08-26   MRN:  [id number]  PCP:  Darreld Mclean, MD    Chief Complaint: Ear Drainage (right ear and has an odor )   History of Present Illness:  Jonathan [name] is a 66 y.o. very pleasant male patient who presents with the following:  I saw him in August at which time he was recovering from a recent stroke and also suffering with an ear infection:  He has recovered fully from his stroke except his wife note that he still sleeps a lot He is still smoking- is not currently interested in quitting  He is on plavix He is also taking amlodipine, lipitor, lisinopril, maxzide  He has had some right OM recently- he was treated with amox- sx persisted and he was changed to cipro- he stared on this about 5 days ago. He has a 14 day supply and is taking this faithfully Hearing seems ok, he is just not sure if his ear is going to get back to normal on its own   Lab Results  Component Value Date   HGBA1C 7.1 (H) 11/28/2016   Eye exam: Flu: do today Pneumonia: needs prevnar, will do today  When I saw him 3 months ago he was complaining of a persistent issue with his right ear, but was still finishing up cipro.  At that time his ear showed some mild debris in the canal He finished all his abx and notes that he still has drainage and odor from his ear He feels that his hearing has gotten worse  unfortunately he did not let me know about this as I had instructed him, it has just been going on for the last 3 months Otherwise he is feeling well.  He did have a recent cold with sneezing and mild cough but no fever, this is getting better He is out of several of his medications- refilled today.  Unclear if he has been taking some of all of his BP medications as he has been out.  Will check A1c today Patient  Active Problem List   Diagnosis Date Noted  . Otitis externa 11/23/2016  . Stroke, acute, embolic ([place]) 29/52/8413  . Type 2 diabetes mellitus ([place]) 04/01/2016  . Epigastric hernia 12/01/2015  . Allergic rhinitis 10/31/2012  . HLD (hyperlipidemia) 09/07/2011  . OSA (obstructive sleep apnea) 06/17/2011  . Insomnia 05/30/2011  . Malaise 04/01/2011  . Hearing loss 01/17/2011  . Knee pain, bilateral 11/18/2010  . H/O: asbestos exposure 11/18/2010  . Inguinal hernia, right 11/07/2010  . Essential hypertension 08/17/2010  . Bradycardia 08/17/2010  . Tobacco use disorder 08/17/2010    Past Medical History:  Diagnosis Date  . Cataract   . COPD (chronic obstructive pulmonary disease) ([place])   . Depression   . Diabetes mellitus without complication ([place])   . HTN (hypertension)   . Hyperlipidemia   . Sleep apnea   . Stroke [facility])     Past Surgical History:  Procedure Laterality Date  . CIRCUMCISION    . none      Social History   Tobacco Use  . Smoking status: Current Every Day Smoker    Packs/day: 0.50    Years: 43.00    Pack years: 21.50    Types: Cigarettes  . Smokeless tobacco: Never  Used  . Tobacco comment: pt is trying to aggressively cut back on # cigs smoked daily.   Substance Use Topics  . Alcohol use: No  . Drug use: No    Family History  Problem Relation Age of Onset  . Cancer Mother   . Heart disease Father     No Known Allergies  Medication list has been reviewed and updated.  Current Outpatient Medications on File Prior to Visit  Medication Sig Dispense Refill  . amLODipine (NORVASC) 10 MG tablet Take 1 tablet (10 mg total) by mouth daily. 90 tablet 3  . atorvastatin (LIPITOR) 40 MG tablet TAKE 1 TABLET BY MOUTH ONCE DAILY 90 tablet 3  . ciprofloxacin (CIPRO) 500 MG tablet Take 1 tablet (500 mg total) by mouth 2 (two) times daily. 28 tablet 0  . clopidogrel (PLAVIX) 75 MG tablet Take 1 tablet (75 mg total) by mouth daily. 30 tablet 2  .  fexofenadine (ALLEGRA ALLERGY) 180 MG tablet Take 1 tablet (180 mg total) by mouth daily. 30 tablet 3  . fluticasone (FLONASE) 50 MCG/ACT nasal spray Place 1 spray into both nostrils daily. 16 g 2  . lisinopril (PRINIVIL,ZESTRIL) 5 MG tablet Take 1 tablet (5 mg total) by mouth daily. 90 tablet 0  . triamterene-hydrochlorothiazide (MAXZIDE-25) 37.5-25 MG tablet Take 1 tablet by mouth daily. 30 tablet 3  . VENTOLIN HFA 108 (90 Base) MCG/ACT inhaler INHALE 2 PUFFS BY MOUTH EVERY 4 HOURS AS NEEDED FOR WHEEZING OR SHORTNESS OF BREATH. 18 each 2   Current Facility-Administered Medications on File Prior to Visit  Medication Dose Route Frequency Provider Last Rate Last Dose  . neomycin-polymyxin-hydrocortisone (CORTISPORIN) OTIC (EAR) suspension 3 drop  3 drop Right EAR Q6H Donnamae Jude, MD        Review of Systems:  As per HPI- otherwise negative. No fever or chills No CP or SOB No vomiting or diarrhea   Physical Examination: Vitals:   03/02/17 1017  BP: (!) 158/82  Pulse: 62  Resp: 16  Temp: 98.7 F (37.1 C)  SpO2: 97%   Vitals:   03/02/17 1017  Weight: 197 lb 6.4 oz (89.5 kg)   Body mass index is 31.38 kg/m. Ideal Body Weight:    GEN: WDWN, NAD, Non-toxic, A & O x 3, obese, here today with his wife and young daughter.  Looks well HEENT: Atraumatic, Normocephalic. Neck supple. No masses, No LAD.  Left ear canal andTM wnl, oropharynx normal.  PEERL,EOMI.   Right ear canal is damp and has a mild foul odor, white discoloration of the right TM TM does not appear to be perforated  Ears and Nose: No external deformity. CV: RRR, No M/G/R. No JVD. No thrill. No extra heart sounds. PULM: CTA B, no wheezes, crackles, rhonchi. No retractions. No resp. distress. No accessory muscle use. EXTR: No c/c/e NEURO Normal gait.  PSYCH: Normally interactive. Conversant. Not depressed or anxious appearing.  Calm demeanor.    Assessment and Plan: Essential hypertension - Plan: lisinopril  (PRINIVIL,ZESTRIL) 5 MG tablet, triamterene-hydrochlorothiazide (MAXZIDE-25) 37.5-25 MG tablet  History of CVA (cerebrovascular accident) - Plan: clopidogrel (PLAVIX) 75 MG tablet  Hyperlipidemia associated with type 2 diabetes mellitus ([place]) - Plan: atorvastatin (LIPITOR) 40 MG tablet  Immunization due - Plan: Pneumococcal conjugate vaccine 13-valent IM  Type 2 diabetes mellitus without complication, without long-term current use of insulin ([place]) - Plan: Basic metabolic panel, Hemoglobin A1c  Chronic otitis externa of right ear, unspecified type - Plan: acetic acid-hydrocortisone (VOSOL-HC)  OTIC solution, Ambulatory referral to ENT  Need for influenza vaccination - Plan: Flu vaccine HIGH DOSE PF (Fluzone High dose)  Refilled medications as above. His BP is high today but I do not think he is taking all of his medications Referral to ENT due to persistent right OE- will try acetic acid and hydrocortisone for possible fungal infection in the meantime Flu shot and prevnar today Will plan further follow- up pending labs.   Signed Lamar Blinks, MD

## 2017-03-02 ENCOUNTER — Encounter: Payer: Self-pay | Admitting: Family Medicine

## 2017-03-02 ENCOUNTER — Ambulatory Visit (INDEPENDENT_AMBULATORY_CARE_PROVIDER_SITE_OTHER): Payer: Medicare Other | Admitting: Family Medicine

## 2017-03-02 VITALS — BP 158/82 | HR 62 | Temp 98.7°F | Resp 16 | Wt 197.4 lb

## 2017-03-02 DIAGNOSIS — E785 Hyperlipidemia, unspecified: Secondary | ICD-10-CM

## 2017-03-02 DIAGNOSIS — H6061 Unspecified chronic otitis externa, right ear: Secondary | ICD-10-CM | POA: Diagnosis not present

## 2017-03-02 DIAGNOSIS — I10 Essential (primary) hypertension: Secondary | ICD-10-CM | POA: Diagnosis not present

## 2017-03-02 DIAGNOSIS — E1169 Type 2 diabetes mellitus with other specified complication: Secondary | ICD-10-CM

## 2017-03-02 DIAGNOSIS — Z23 Encounter for immunization: Secondary | ICD-10-CM

## 2017-03-02 DIAGNOSIS — E119 Type 2 diabetes mellitus without complications: Secondary | ICD-10-CM

## 2017-03-02 DIAGNOSIS — Z8673 Personal history of transient ischemic attack (TIA), and cerebral infarction without residual deficits: Secondary | ICD-10-CM

## 2017-03-02 LAB — BASIC METABOLIC PANEL
BUN: 18 mg/dL (ref 6–23)
CO2: 24 meq/L (ref 19–32)
Calcium: 9.7 mg/dL (ref 8.4–10.5)
Chloride: 103 mEq/L (ref 96–112)
Creatinine, Ser: 1.17 mg/dL (ref 0.40–1.50)
GFR: 66.33 mL/min (ref >60.00)
GLUCOSE: 101 mg/dL — AB (ref 70–99)
POTASSIUM: 4.1 meq/L (ref 3.5–5.1)
SODIUM: 136 meq/L (ref 135–145)

## 2017-03-02 LAB — HEMOGLOBIN A1C: Hgb A1c MFr Bld: 6.6 % — ABNORMAL HIGH (ref 4.6–6.5)

## 2017-03-02 MED ORDER — ATORVASTATIN CALCIUM 40 MG PO TABS: 40.0000 mg | ORAL_TABLET | Freq: Every day | ORAL | 3 refills | Status: DC

## 2017-03-02 MED ORDER — CLOPIDOGREL BISULFATE 75 MG PO TABS: 75.0000 mg | ORAL_TABLET | Freq: Every day | ORAL | 3 refills | Status: DC

## 2017-03-02 MED ORDER — LISINOPRIL 5 MG PO TABS: 5.0000 mg | ORAL_TABLET | Freq: Every day | ORAL | 3 refills | Status: DC

## 2017-03-02 MED ORDER — TRIAMTERENE-HCTZ 37.5-25 MG PO TABS: 1.0000 | ORAL_TABLET | Freq: Every day | ORAL | 3 refills | Status: DC

## 2017-03-02 MED ORDER — HYDROCORTISONE-ACETIC ACID 1-2 % OT SOLN: 3.0000 [drp] | Freq: Four times a day (QID) | OTIC | 0 refills | Status: DC

## 2017-03-02 NOTE — Patient Instructions (Signed)
It was good to see you today We refilled your medications, and I am referring you to ENT to look at your ear.  In the meantime will try a different drop as below  You got your pneumonia and flu shots today  I will be in touch with your labs- please see me in 3 months

## 2017-03-30 ENCOUNTER — Ambulatory Visit: Payer: Self-pay | Admitting: Family Medicine

## 2017-05-27 NOTE — Progress Notes (Addendum)
[place] at [facility] [address] [place], [place] [address] [phone]  Date:  05/31/2017   Name:  Jonathan [name]   DOB:  [birth year]   MRN:  [id number]  PCP:  Darreld Mclean, MD    Chief Complaint: Follow-up (Pt here for 4 month f/u visit. ) and Dizziness (c/o dizziness that comes and goes x 1 week. Pt states it feels like the room is spinning )   History of Present Illness:  Jonathan [name] is a 67 y.o. very pleasant male patient who presents with the following:  Follow-up visit today Last seen by myself in November  Lab Results  Component Value Date   HGBA1C 6.6 (H) 03/02/2017   History of ischemic stroke in 2017 DM, HTN DM has been under good control recently on diet only He takes plavix for history of CVA HTN treated with lisinopril, maxzide- not taken yet today however Lipitor for his cholesterol He ran out of his cholesterol medication about a month ago- they did not tell me that they were not able to get this medication filled for some reason.  He does want to get back on it  He is fasting today   Eye exam: about 2 years ago, he has cataracts but has not followed up about these as of yet  Colon cancer screening: Foot exam is due today  He did not take his BP meds this am- he usually takes them in the morning  Here today with his wife Helene Kelp who also wants to be seen acutely today  Chin notes that he has had some dizziness recently He has noted this for about one week It is a feeling of vertigo, not lightheadedness- will last about a minute at a time Sudden onset of dizziness, will come and go rapidly Episodes last about one minute.  Moving his head seems to make it BETTER No difficutly swallowing or double vision   His wife notes that he is sleeping a lot, or wanting to sleep a lot No fever or chills No headaches Pt states he has never had this sort of dizziness in the past  No unusual headaches  He  had this acute ischemic stroke back in 12/17- he never followed up with neurology after his hospital stay He is still smoking He is taking his Plavix on a regular basis He has not noted any weakness or difficulty speaking, no numbness or tingling in his body   Patient Active Problem List   Diagnosis Date Noted  . Otitis externa 11/23/2016  . Stroke, acute, embolic ([place]) 94/76/5465  . Type 2 diabetes mellitus ([place]) 04/01/2016  . Epigastric hernia 12/01/2015  . Allergic rhinitis 10/31/2012  . HLD (hyperlipidemia) 09/07/2011  . OSA (obstructive sleep apnea) 06/17/2011  . Insomnia 05/30/2011  . Malaise 04/01/2011  . Hearing loss 01/17/2011  . Knee pain, bilateral 11/18/2010  . H/O: asbestos exposure 11/18/2010  . Inguinal hernia, right 11/07/2010  . Essential hypertension 08/17/2010  . Bradycardia 08/17/2010  . Tobacco use disorder 08/17/2010    Past Medical History:  Diagnosis Date  . Cataract   . COPD (chronic obstructive pulmonary disease) ([place])   . Depression   . Diabetes mellitus without complication ([place])   . HTN (hypertension)   . Hyperlipidemia   . Sleep apnea   . Stroke [facility])     Past Surgical History:  Procedure Laterality Date  . CIRCUMCISION    . none  Social History   Tobacco Use  . Smoking status: Current Every Day Smoker    Packs/day: 0.50    Years: 43.00    Pack years: 21.50    Types: Cigarettes  . Smokeless tobacco: Never Used  . Tobacco comment: pt is trying to aggressively cut back on # cigs smoked daily.   Substance Use Topics  . Alcohol use: No  . Drug use: No    Family History  Problem Relation Age of Onset  . Cancer Mother   . Heart disease Father     No Known Allergies  Medication list has been reviewed and updated.  Current Outpatient Medications on File Prior to Visit  Medication Sig Dispense Refill  . acetic acid-hydrocortisone (VOSOL-HC) OTIC solution Place 3 drops 4 (four) times daily into the right ear. 10 mL 0  .  amLODipine (NORVASC) 10 MG tablet Take 1 tablet (10 mg total) by mouth daily. 90 tablet 3  . atorvastatin (LIPITOR) 40 MG tablet Take 1 tablet (40 mg total) daily by mouth. 90 tablet 3  . ciprofloxacin (CIPRO) 500 MG tablet Take 1 tablet (500 mg total) by mouth 2 (two) times daily. 28 tablet 0  . clopidogrel (PLAVIX) 75 MG tablet Take 1 tablet (75 mg total) daily by mouth. 90 tablet 3  . fexofenadine (ALLEGRA ALLERGY) 180 MG tablet Take 1 tablet (180 mg total) by mouth daily. 30 tablet 3  . fluticasone (FLONASE) 50 MCG/ACT nasal spray Place 1 spray into both nostrils daily. 16 g 2  . lisinopril (PRINIVIL,ZESTRIL) 5 MG tablet Take 1 tablet (5 mg total) daily by mouth. 90 tablet 3  . triamterene-hydrochlorothiazide (MAXZIDE-25) 37.5-25 MG tablet Take 1 tablet daily by mouth. 30 tablet 3  . VENTOLIN HFA 108 (90 Base) MCG/ACT inhaler INHALE 2 PUFFS BY MOUTH EVERY 4 HOURS AS NEEDED FOR WHEEZING OR SHORTNESS OF BREATH. 18 each 2   Current Facility-Administered Medications on File Prior to Visit  Medication Dose Route Frequency Provider Last Rate Last Dose  . neomycin-polymyxin-hydrocortisone (CORTISPORIN) OTIC (EAR) suspension 3 drop  3 drop Right EAR Q6H Donnamae Jude, MD        Review of Systems:  As per HPI- otherwise negative.   Physical Examination: Vitals:   05/31/17 0908  BP: (!) 182/90  Pulse: 69  Temp: 98.4 F (36.9 C)  SpO2: 98%   Vitals:   05/31/17 0908  Weight: 199 lb (90.3 kg)   Body mass index is 31.64 kg/m. Ideal Body Weight:    GEN: WDWN, NAD, Non-toxic, A & O x 3, obese, looks well  HEENT: Atraumatic, Normocephalic. Neck supple. No masses, No LAD.  Bilateral TM wnl, oropharynx normal.  PEERL,EOMI.   Ears and Nose: No external deformity. CV: RRR, No M/G/R. No JVD. No thrill. No extra heart sounds. PULM: CTA B, no wheezes, crackles, rhonchi. No retractions. No resp. distress. No accessory muscle use. ABD: S, NT, ND. No rebound. No HSM. EXTR: No c/c/e NEURO Normal  gait.  Normal strength, sensation and DTR of all extremities Normal RAM of hands, negative Romberg today Normal facial movement PSYCH: Normally interactive. Conversant. Not depressed or anxious appearing.  Calm demeanor.  Normal foot exam today  Assessment and Plan: History of CVA (cerebrovascular accident) - Plan: MR Brain W Wo Contrast, Ambulatory referral to Neurology  Hyperlipidemia associated with type 2 diabetes mellitus ([place]) - Plan: atorvastatin (LIPITOR) 40 MG tablet, Lipid panel  Essential hypertension - Plan: CBC, Comprehensive metabolic panel  Type 2 diabetes mellitus  without complication, without long-term current use of insulin ([place]) - Plan: Comprehensive metabolic panel  Tobacco abuse  Vertigo - Plan: MR Brain W Wo Contrast, Ambulatory referral to Neurology  Here today for a follow-up visit.  Will check his A1c, lipids today.  He has run out of lipitor for the last month but was taking it previously  HTN- he did not take his medications yet today so difficulty to tell how well they are working History of CVA, no neurology follow-up.  Referred to neurology In the meantime ordered an MRI to eval for any other evidence of new CVA  Signed Lamar Blinks, MD  Received his labs- letter to pt Results for orders placed or performed in visit on 05/31/17  CBC  Result Value Ref Range   WBC 6.4 4.0 - 10.5 K/uL   RBC 5.64 4.22 - 5.81 Mil/uL   Platelets 242.0 150.0 - 400.0 K/uL   Hemoglobin 16.9 13.0 - 17.0 g/dL   HCT 49.9 39.0 - 52.0 %   MCV 88.5 78.0 - 100.0 fl   MCHC 33.8 30.0 - 36.0 g/dL   RDW 13.9 11.5 - 15.5 %  Comprehensive metabolic panel  Result Value Ref Range   Sodium 135 135 - 145 mEq/L   Potassium 3.9 3.5 - 5.1 mEq/L   Chloride 101 96 - 112 mEq/L   CO2 26 19 - 32 mEq/L   Glucose, Bld 97 70 - 99 mg/dL   BUN 21 6 - 23 mg/dL   Creatinine, Ser 1.21 0.40 - 1.50 mg/dL   Total Bilirubin 0.6 0.2 - 1.2 mg/dL   Alkaline Phosphatase 95 39 - 117 U/L   AST 19 0 -  37 U/L   ALT 30 0 - 53 U/L   Total Protein 8.0 6.0 - 8.3 g/dL   Albumin 4.4 3.5 - 5.2 g/dL   Calcium 9.3 8.4 - 10.5 mg/dL   GFR 63.76 >60.00 mL/min  Lipid panel  Result Value Ref Range   Cholesterol 182 0 - 200 mg/dL   Triglycerides 259.0 (H) 0.0 - 149.0 mg/dL   HDL 21.80 (L) >39.00 mg/dL   VLDL 51.8 (H) 0.0 - 40.0 mg/dL   Total CHOL/HDL Ratio 8    NonHDL 159.79   LDL cholesterol, direct  Result Value Ref Range   Direct LDL 128.0 mg/dL   Called him 2/14 to go over his MRI, as below  Mr Jeri Cos Wo Contrast  Result Date: 06/04/2017 CLINICAL DATA:  Vertigo. History of CVA. Recent diagnosis of ear infection. EXAM: MRI HEAD WITHOUT AND WITH CONTRAST TECHNIQUE: Multiplanar, multiecho pulse sequences of the brain and surrounding structures were obtained without and with intravenous contrast. 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 Based on FLAIR imaging in 2017 this mass is stable. Postcontrast imaging was not obtained at that time. There was an MRA at that time and there was no aneurysm of the pica. This could reflect a meningioma, nerve sheath tumor, or benign enhancing foramen magnum lesion which has been described in the literature. No new finding to explain new onset vertigo. Normal appearance of the brainstem. There is right mastoid and middle ear opacification. Standard brain MRI technique with no visible labyrinthine or IAC pathology. No infarct, hemorrhage, hydrocephalus, or blood products. Remote perforator infarct in the left corona radiata. Vascular: Major flow voids are preserved. Skull and upper  cervical spine: No evidence of marrow lesion. Sinuses/Orbits: Negative IMPRESSION: 1. Right mastoid and middle ear opacification. 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 No detectable inner ear pathology. 2. 8 mm mass in the right aspect of the foramen magnum. In retrospect this was present and stable from 2017 exam. This could reflect a "benign enhancing foramen magnum lesion" which have been recently described in the literature, nerve sheath tumor, or less likely meningioma. Suggest 1 year follow-up with contrast for additional stability. A MRA was obtained in 2017 and there was no pica aneurysm. Electronically Signed   By: Monte Fantasia M.D.   On: 06/04/2017 07:16   Pt notes that he is no longer dizzy, but he is still having drainage and irritatoin of his right ear.  I will rx augmentin for him to his drug store, and will set him up to see ENT for follow-up He does not clinically have mastoiditis- no fever or pain  Copy of MRI to pt for his records

## 2017-05-31 ENCOUNTER — Encounter: Payer: Self-pay | Admitting: Family Medicine

## 2017-05-31 ENCOUNTER — Ambulatory Visit (INDEPENDENT_AMBULATORY_CARE_PROVIDER_SITE_OTHER): Payer: Medicare Other | Admitting: Family Medicine

## 2017-05-31 VITALS — BP 162/94 | HR 69 | Temp 98.4°F | Ht 66.5 in | Wt 199.0 lb

## 2017-05-31 DIAGNOSIS — Z8673 Personal history of transient ischemic attack (TIA), and cerebral infarction without residual deficits: Secondary | ICD-10-CM | POA: Diagnosis not present

## 2017-05-31 DIAGNOSIS — H6691 Otitis media, unspecified, right ear: Secondary | ICD-10-CM | POA: Diagnosis not present

## 2017-05-31 DIAGNOSIS — Z720 Tobacco use: Secondary | ICD-10-CM

## 2017-05-31 DIAGNOSIS — E785 Hyperlipidemia, unspecified: Secondary | ICD-10-CM

## 2017-05-31 DIAGNOSIS — I10 Essential (primary) hypertension: Secondary | ICD-10-CM

## 2017-05-31 DIAGNOSIS — M899 Disorder of bone, unspecified: Secondary | ICD-10-CM

## 2017-05-31 DIAGNOSIS — E119 Type 2 diabetes mellitus without complications: Secondary | ICD-10-CM

## 2017-05-31 DIAGNOSIS — R42 Dizziness and giddiness: Secondary | ICD-10-CM | POA: Diagnosis not present

## 2017-05-31 DIAGNOSIS — E1169 Type 2 diabetes mellitus with other specified complication: Secondary | ICD-10-CM | POA: Diagnosis not present

## 2017-05-31 LAB — COMPREHENSIVE METABOLIC PANEL
ALBUMIN: 4.4 g/dL (ref 3.5–5.2)
ALT: 30 U/L (ref 0–53)
AST: 19 U/L (ref 0–37)
Alkaline Phosphatase: 95 U/L (ref 39–117)
BUN: 21 mg/dL (ref 6–23)
CALCIUM: 9.3 mg/dL (ref 8.4–10.5)
CO2: 26 meq/L (ref 19–32)
Chloride: 101 mEq/L (ref 96–112)
Creatinine, Ser: 1.21 mg/dL (ref 0.40–1.50)
GFR: 63.76 mL/min (ref >60.00)
Glucose, Bld: 97 mg/dL (ref 70–99)
POTASSIUM: 3.9 meq/L (ref 3.5–5.1)
Sodium: 135 mEq/L (ref 135–145)
Total Bilirubin: 0.6 mg/dL (ref 0.2–1.2)
Total Protein: 8 g/dL (ref 6.0–8.3)

## 2017-05-31 LAB — CBC
HEMATOCRIT: 49.9 % (ref 39.0–52.0)
Hemoglobin: 16.9 g/dL (ref 13.0–17.0)
MCHC: 33.8 g/dL (ref 30.0–36.0)
MCV: 88.5 fl (ref 78.0–100.0)
PLATELETS: 242 10*3/uL (ref 150.0–400.0)
RBC: 5.64 Mil/uL (ref 4.22–5.81)
RDW: 13.9 % (ref 11.5–15.5)
WBC: 6.4 10*3/uL (ref 4.0–10.5)

## 2017-05-31 LAB — LIPID PANEL
CHOLESTEROL: 182 mg/dL (ref 0–200)
HDL: 21.8 mg/dL — AB (ref >39.00)
NONHDL: 159.79
TRIGLYCERIDES: 259 mg/dL — AB (ref 0.0–149.0)
Total CHOL/HDL Ratio: 8
VLDL: 51.8 mg/dL — AB (ref 0.0–40.0)

## 2017-05-31 LAB — LDL CHOLESTEROL, DIRECT: Direct LDL: 128 mg/dL

## 2017-05-31 MED ORDER — ATORVASTATIN CALCIUM 40 MG PO TABS: 40.0000 mg | ORAL_TABLET | Freq: Every day | ORAL | 3 refills | Status: DC

## 2017-05-31 NOTE — Patient Instructions (Addendum)
Please take your blood pressure medication daily. Please quit smoking!  This is very important for your health!   I will be in touch with your labs asap We are going to set you up for a brain MRI to look for any repeat stroke.  I will also refer you to neurology for a follow-up visit  If you have any worsening or change in your symptoms please seek care right away

## 2017-06-03 ENCOUNTER — Ambulatory Visit (HOSPITAL_BASED_OUTPATIENT_CLINIC_OR_DEPARTMENT_OTHER)
Admission: RE | Admit: 2017-06-03 | Discharge: 2017-06-03 | Disposition: A | Discharge status: Home / Self Care | Payer: Medicare Other | Source: Ambulatory Visit | Attending: Family Medicine | Admitting: Family Medicine

## 2017-06-03 DIAGNOSIS — Z8673 Personal history of transient ischemic attack (TIA), and cerebral infarction without residual deficits: Secondary | ICD-10-CM | POA: Insufficient documentation

## 2017-06-03 DIAGNOSIS — J392 Other diseases of pharynx: Secondary | ICD-10-CM | POA: Diagnosis not present

## 2017-06-03 DIAGNOSIS — R42 Dizziness and giddiness: Secondary | ICD-10-CM | POA: Insufficient documentation

## 2017-06-03 MED ORDER — GADOBENATE DIMEGLUMINE 529 MG/ML IV SOLN
20.0000 mL | Freq: Once | INTRAVENOUS | Status: AC | PRN
  Administered 2017-06-03: 17 mL via INTRAVENOUS

## 2017-06-08 ENCOUNTER — Telehealth: Payer: Self-pay | Admitting: Family Medicine

## 2017-06-08 MED ORDER — AMOXICILLIN-POT CLAVULANATE 875-125 MG PO TABS
1.0000 | ORAL_TABLET | Freq: Two times a day (BID) | ORAL | 0 refills | Status: DC
Start: 2017-06-08 — End: 2018-01-25

## 2017-06-08 NOTE — Telephone Encounter (Signed)
error 

## 2017-06-08 NOTE — Addendum Note (Signed)
Addended by: Lamar Blinks C on: 06/08/2017 01:13 PM   Modules accepted: Orders

## 2017-07-13 ENCOUNTER — Encounter (HOSPITAL_COMMUNITY): Payer: Self-pay | Admitting: *Deleted

## 2017-07-13 ENCOUNTER — Emergency Department (HOSPITAL_COMMUNITY)
Admission: EM | Admit: 2017-07-13 | Discharge: 2017-07-13 | Disposition: A | Discharge status: Home / Self Care | Payer: Medicare Other | Attending: Emergency Medicine | Admitting: Emergency Medicine

## 2017-07-13 DIAGNOSIS — Z79899 Other long term (current) drug therapy: Secondary | ICD-10-CM | POA: Diagnosis not present

## 2017-07-13 DIAGNOSIS — R55 Syncope and collapse: Secondary | ICD-10-CM | POA: Insufficient documentation

## 2017-07-13 DIAGNOSIS — I10 Essential (primary) hypertension: Secondary | ICD-10-CM | POA: Diagnosis not present

## 2017-07-13 DIAGNOSIS — R197 Diarrhea, unspecified: Secondary | ICD-10-CM | POA: Insufficient documentation

## 2017-07-13 DIAGNOSIS — J449 Chronic obstructive pulmonary disease, unspecified: Secondary | ICD-10-CM | POA: Insufficient documentation

## 2017-07-13 DIAGNOSIS — E119 Type 2 diabetes mellitus without complications: Secondary | ICD-10-CM | POA: Diagnosis not present

## 2017-07-13 DIAGNOSIS — Z7902 Long term (current) use of antithrombotics/antiplatelets: Secondary | ICD-10-CM | POA: Insufficient documentation

## 2017-07-13 DIAGNOSIS — Z8673 Personal history of transient ischemic attack (TIA), and cerebral infarction without residual deficits: Secondary | ICD-10-CM | POA: Diagnosis not present

## 2017-07-13 DIAGNOSIS — F17210 Nicotine dependence, cigarettes, uncomplicated: Secondary | ICD-10-CM | POA: Diagnosis not present

## 2017-07-13 DIAGNOSIS — R42 Dizziness and giddiness: Secondary | ICD-10-CM | POA: Diagnosis not present

## 2017-07-13 DIAGNOSIS — R404 Transient alteration of awareness: Secondary | ICD-10-CM | POA: Diagnosis not present

## 2017-07-13 LAB — BASIC METABOLIC PANEL
Anion gap: 10 (ref 5–15)
BUN: 23 mg/dL — AB (ref 6–20)
CHLORIDE: 106 mmol/L (ref 101–111)
CO2: 21 mmol/L — AB (ref 22–32)
CREATININE: 1.25 mg/dL — AB (ref 0.61–1.24)
Calcium: 8.7 mg/dL — ABNORMAL LOW (ref 8.9–10.3)
GFR calc non Af Amer: 58 mL/min — ABNORMAL LOW (ref >60)
Glucose, Bld: 123 mg/dL — ABNORMAL HIGH (ref 65–99)
POTASSIUM: 4 mmol/L (ref 3.5–5.1)
Sodium: 137 mmol/L (ref 135–145)

## 2017-07-13 LAB — I-STAT TROPONIN, ED: Troponin i, poc: 0.01 ng/mL (ref 0.00–0.08)

## 2017-07-13 LAB — CBC
HEMATOCRIT: 44.2 % (ref 39.0–52.0)
Hemoglobin: 15 g/dL (ref 13.0–17.0)
MCH: 30.1 pg (ref 26.0–34.0)
MCHC: 33.9 g/dL (ref 30.0–36.0)
MCV: 88.8 fL (ref 78.0–100.0)
Platelets: 235 10*3/uL (ref 150–400)
RBC: 4.98 MIL/uL (ref 4.22–5.81)
RDW: 13.8 % (ref 11.5–15.5)
WBC: 7.3 10*3/uL (ref 4.0–10.5)

## 2017-07-13 MED ORDER — SODIUM CHLORIDE 0.9 % IV BOLUS (SEPSIS)
1000.0000 mL | Freq: Once | INTRAVENOUS | Status: AC
  Administered 2017-07-13: 1000 mL via INTRAVENOUS

## 2017-07-13 NOTE — Discharge Instructions (Addendum)
Your blood work and EKG was reassuring.   Please follow-up with your regular doctor regarding your blood sugar which was mildly elevated today (BG 121).  I also want you to keep your appointment with cardiology in April and let them know about what happened today for potential need for further monitoring.  Please drink plenty of fluids at home other than coffee.  Return to the emergency department if you develop chest pain, have trouble breathing, have worsening dizziness with trouble walking or have new or concerning symptoms.

## 2017-07-13 NOTE — ED Triage Notes (Signed)
Pt in via EMS from home after near syncopal event, states he got up to go to the bathroom and felt dizzy and thought he would pass out, no LOC, pt alert and oriented, reports intermittent diarrhea the last few days and decreased PO intake, IV started PTA, CBG 156

## 2017-07-13 NOTE — ED Provider Notes (Signed)
[facility] Provider Note   CSN: [id number] Arrival date & time: 07/13/17  6789     History   Chief Complaint Chief Complaint  Patient presents with  . Near Syncope    HPI Jonathan [name] is a 67 y.o. male.  HPI  Jonathan [name] is a 67 year old male with a history of CVA (03/2016), hypertension, hyperlipidemia, type 2 diabetes, COPD who presents to the emergency department for evaluation of near syncope.  Patient reports that he woke up this morning around 5:45AM and sat up to go to the bathroom.  Immediately upon sitting he reports that the room appeared to be spinning around him.  Reports that he stood up and continue to feel as if the room was spinning and somewhat lightheaded.  He walked towards the bathroom and states that he fell backwards hitting his back against the dryer and slowly bring himself to the ground.  He denies hitting his head or loss of consciousness.  States that he was able to stand up on his own and proceeded to go to the bathroom and walk back to his bed.  Lying back in the bed he continued to feel as if the room was spinning, prompting his wife to call EMS.  He states that on the ambulance ride over room spinning resolved and he now feels back to baseline.  He denies associated chest pain, SOB or palpitations during the event.  He denies headache, visual disturbance, dysarthria, focal numbness or weakness during.  States that a similar event happened about a month ago and also resolved on its own.  He also reports that he has had 4-5 episodes of diarrhea daily for the past 2 days, no associated fever or abdominal pain.  States that he drinks about a pot of coffee a day and does not have any other p.o. fluids.  Denies recent changes in his medications. At this time he has no complaints.  Denies fevers, chills, headache, chest pain, palpitations, cough, shortness of breath, abdominal pain, dysuria, lightheadedness, dizziness.   Past  Medical History:  Diagnosis Date  . Cataract   . COPD (chronic obstructive pulmonary disease) ([place])   . Depression   . Diabetes mellitus without complication ([place])   . HTN (hypertension)   . Hyperlipidemia   . Sleep apnea   . Stroke [facility])     Patient Active Problem List   Diagnosis Date Noted  . Otitis externa 11/23/2016  . Stroke, acute, embolic ([place]) 38/01/1750  . Type 2 diabetes mellitus ([place]) 04/01/2016  . Epigastric hernia 12/01/2015  . Allergic rhinitis 10/31/2012  . HLD (hyperlipidemia) 09/07/2011  . OSA (obstructive sleep apnea) 06/17/2011  . Insomnia 05/30/2011  . Malaise 04/01/2011  . Hearing loss 01/17/2011  . Knee pain, bilateral 11/18/2010  . H/O: asbestos exposure 11/18/2010  . Inguinal hernia, right 11/07/2010  . Essential hypertension 08/17/2010  . Bradycardia 08/17/2010  . Tobacco use disorder 08/17/2010    Past Surgical History:  Procedure Laterality Date  . CIRCUMCISION    . none         Home Medications    Prior to Admission medications   Medication Sig Start Date End Date Taking? Authorizing Provider  acetic acid-hydrocortisone (VOSOL-HC) OTIC solution Place 3 drops 4 (four) times daily into the right ear. 03/02/17   Copland, Gay Filler, MD  amLODipine (NORVASC) 10 MG tablet Take 1 tablet (10 mg total) by mouth daily. 11/28/16   Copland, Gay Filler, MD  amoxicillin-clavulanate (AUGMENTIN)  875-125 MG tablet Take 1 tablet by mouth 2 (two) times daily. 06/08/17   Copland, Gay Filler, MD  atorvastatin (LIPITOR) 40 MG tablet Take 1 tablet (40 mg total) by mouth daily. 05/31/17   Copland, Gay Filler, MD  clopidogrel (PLAVIX) 75 MG tablet Take 1 tablet (75 mg total) daily by mouth. 03/02/17   Copland, Gay Filler, MD  fexofenadine (ALLEGRA ALLERGY) 180 MG tablet Take 1 tablet (180 mg total) by mouth daily. 08/25/16   Virginia Crews, MD  fluticasone (FLONASE) 50 MCG/ACT nasal spray Place 1 spray into both nostrils daily. 08/25/16   Virginia Crews, MD    lisinopril (PRINIVIL,ZESTRIL) 5 MG tablet Take 1 tablet (5 mg total) daily by mouth. 03/02/17   Copland, Gay Filler, MD  triamterene-hydrochlorothiazide (MAXZIDE-25) 37.5-25 MG tablet Take 1 tablet daily by mouth. 03/02/17   Copland, Gay Filler, MD  VENTOLIN HFA 108 (90 Base) MCG/ACT inhaler INHALE 2 PUFFS BY MOUTH EVERY 4 HOURS AS NEEDED FOR WHEEZING OR SHORTNESS OF BREATH. 05/08/15   Leeanne Rio, MD    Family History Family History  Problem Relation Age of Onset  . Cancer Mother   . Heart disease Father     Social History Social History   Tobacco Use  . Smoking status: Current Every Day Smoker    Packs/day: 0.50    Years: 43.00    Pack years: 21.50    Types: Cigarettes  . Smokeless tobacco: Never Used  . Tobacco comment: pt is trying to aggressively cut back on # cigs smoked daily.   Substance Use Topics  . Alcohol use: No  . Drug use: No     Allergies   Patient has no known allergies.   Review of Systems Review of Systems  Constitutional: Negative for chills and fever.  HENT: Negative for congestion.   Eyes: Negative for visual disturbance.  Respiratory: Negative for cough and shortness of breath.   Cardiovascular: Negative for chest pain, palpitations and leg swelling.  Gastrointestinal: Positive for diarrhea. Negative for abdominal pain, blood in stool, nausea and vomiting.  Genitourinary: Negative for dysuria.  Musculoskeletal: Negative for gait problem.  Skin: Negative for rash.  Neurological: Positive for dizziness (room spinning briefly this morning, resolved) and light-headedness (briefly this morning, resolved). Negative for seizures, syncope, facial asymmetry, speech difficulty, weakness, numbness and headaches.  Psychiatric/Behavioral: Negative for agitation and behavioral problems.     Physical Exam Updated Vital Signs BP (!) 142/90 (BP Location: Right Arm)   Pulse 65   Temp 98.5 F (36.9 C) (Oral)   Resp 20   Ht 5' 6.5" (1.689 m)   Wt 92.5  kg (204 lb)   SpO2 100%   BMI 32.43 kg/m   Physical Exam  Constitutional: He is oriented to person, place, and time. He appears well-developed and well-nourished. No distress.  HENT:  Head: Normocephalic and atraumatic.  Mouth/Throat: Oropharynx is clear and moist. No oropharyngeal exudate.  Mucous memories moist.  Eyes: Pupils are equal, round, and reactive to light. Conjunctivae and EOM are normal. Right eye exhibits no discharge. Left eye exhibits no discharge.  Neck: Normal range of motion. Neck supple. No JVD present. No tracheal deviation present.  Cardiovascular: Normal rate, regular rhythm and intact distal pulses. Exam reveals no friction rub.  No murmur heard. Pulmonary/Chest: Effort normal and breath sounds normal. No stridor. No respiratory distress. He has no wheezes. He has no rales.  Abdominal: Soft. Bowel sounds are normal. There is no tenderness. There is no  guarding.  Musculoskeletal: Normal range of motion.  Neurological: He is alert and oriented to person, place, and time. Coordination normal.  Mental Status:  Alert, oriented, thought content appropriate, able to give a coherent history. Speech fluent without evidence of aphasia. Able to follow 2 step commands without difficulty.  Cranial Nerves:  II:  Peripheral visual fields grossly normal, pupils equal, round, reactive to light III,IV, VI: ptosis not present, extra-ocular motions intact bilaterally  V,VII: smile symmetric, facial light touch sensation equal VIII: hearing grossly normal to voice  X: uvula elevates symmetrically  XI: bilateral shoulder shrug symmetric and strong XII: midline tongue extension without fassiculations Motor:  Normal tone. 5/5 in upper and lower extremities bilaterally including strong and equal grip strength and dorsiflexion/plantar flexion Sensory: Pinprick and light touch normal in all extremities.  Deep Tendon Reflexes: 2+ and symmetric in the patella Cerebellar: normal  finger-to-nose with bilateral upper extremities Gait: normal gait and balance, patient denies room spinning or lightheadedness No pronator drift  Skin: Skin is warm and dry. He is not diaphoretic.  Psychiatric: He has a normal mood and affect. His behavior is normal.  Nursing note and vitals reviewed.    ED Treatments / Results  Labs (all labs ordered are listed, but only abnormal results are displayed) Labs Reviewed  BASIC METABOLIC PANEL - Abnormal; Notable for the following components:      Result Value   CO2 21 (*)    Glucose, Bld 123 (*)    BUN 23 (*)    Creatinine, Ser 1.25 (*)    Calcium 8.7 (*)    GFR calc non Af Amer 58 (*)    All other components within normal limits  CBC  URINALYSIS, ROUTINE W REFLEX MICROSCOPIC  CBG MONITORING, ED  I-STAT TROPONIN, ED    EKG  EKG Interpretation  Date/Time:  Thursday July 13 2017 07:38:38 EDT Ventricular Rate:  52 PR Interval:    QRS Duration: 96 QT Interval:  451 QTC Calculation: 420 R Axis:   94 Text Interpretation:  Sinus rhythm Right axis deviation T wave abnormality No significant change since last tracing Abnormal ekg Confirmed by Carmin Muskrat [phone]) on 07/13/2017 7:47:21 AM       Radiology No results found.  Procedures Procedures (including critical care time)  Medications Ordered in ED Medications  sodium chloride 0.9 % bolus 1,000 mL (1,000 mLs Intravenous New Bag/Given 07/13/17 0852)     Initial Impression / Assessment and Plan / ED Course  I have reviewed the triage vital signs and the nursing notes.  Pertinent labs & imaging results that were available during my care of the patient were reviewed by me and considered in my medical decision making (see chart for details).   Presents to the emergency department following a presyncopal event.  Patient endorses room spinning sensation and also lightheadedness.  He denies associated headache, visual disturbance, focal numbness or weakness.  He reports  that his symptoms have completely resolved.  No neurological deficits on exam. He is able to ambulate independently without difficulty. Given normal neurological exam do not suspect CVA. Do not think this was a TIA given no associated neurological symptoms during the event and symptoms worsened with standing up. No history of patient losing conscious or hitting his head, do not suspect ICH. For these reasons do not think head imaging is indicated at this time.   Patient denies chest pain, shortness of breath, palpitations.  On exam, rate normal and rhythm regular.  EKG in  sinus rhythm.  He does have T wave inversions in V4 through V6 but this is stable from previous EKG 03/2016. Troponin negative. Do not suspect ACS given presentation and lab work. Patient's blood pressure 142/90, no hypotension while in the emergency department. Rhythm regular throughout time in the emergency department.  Labs reviewed.  BMP without any major electrolyte abnormalities.  Creatinine 1.25 which is baseline.  Blood sugar mildly elevated at 123, patient states that he is a "borderline diabetic."  Have discussed follow-up with his primary care doctor regarding further diabetic monitoring and management. CBC within normal limits.  Patient's symptoms today likely vertigo versus orthostatic hypotension in the setting of recent diarrhea and solely drinking coffee yesterday. Plan to have patient follow up with cardiology for follow up (has an appointment scheduled early next month) and potential need for future Holter monitoring. Counseled patient on return precautions.  Patient agrees and voiced understanding to the above plan. Discussed this patient with Dr. Vanita Panda who also saw the patient and agrees with plan and discharge.   Final Clinical Impressions(s) / ED Diagnoses   Final diagnoses:  Near syncope    ED Discharge Orders    None       [name] 07/13/17 [id number]    Carmin Muskrat, MD 07/13/17  1651

## 2017-07-17 LAB — FECAL OCCULT BLOOD, GUAIAC: Fecal Occult Blood: NEGATIVE

## 2017-07-20 ENCOUNTER — Encounter: Payer: Self-pay | Admitting: Family Medicine

## 2017-07-20 ENCOUNTER — Ambulatory Visit (INDEPENDENT_AMBULATORY_CARE_PROVIDER_SITE_OTHER): Payer: Medicare Other | Admitting: Family Medicine

## 2017-07-20 VITALS — BP 140/82 | HR 67 | Temp 98.4°F | Ht 66.5 in | Wt 201.4 lb

## 2017-07-20 DIAGNOSIS — E119 Type 2 diabetes mellitus without complications: Secondary | ICD-10-CM

## 2017-07-20 DIAGNOSIS — H6691 Otitis media, unspecified, right ear: Secondary | ICD-10-CM

## 2017-07-20 LAB — BASIC METABOLIC PANEL
BUN: 26 mg/dL — AB (ref 6–23)
CHLORIDE: 102 meq/L (ref 96–112)
CO2: 25 mEq/L (ref 19–32)
CREATININE: 1.08 mg/dL (ref 0.40–1.50)
Calcium: 9.2 mg/dL (ref 8.4–10.5)
GFR: 72.67 mL/min (ref >60.00)
GLUCOSE: 173 mg/dL — AB (ref 70–99)
POTASSIUM: 3.8 meq/L (ref 3.5–5.1)
Sodium: 135 mEq/L (ref 135–145)

## 2017-07-20 LAB — HEMOGLOBIN A1C: HEMOGLOBIN A1C: 7.1 % — AB (ref 4.6–6.5)

## 2017-07-20 NOTE — Progress Notes (Addendum)
[facility] at [facility] [address] [place], [place] [address] [phone]  Date:  07/20/2017   Name:  Jonathan [name]   DOB:  February 16, 1951   MRN:  [id number]  PCP:  Darreld Mclean, MD    Chief Complaint: Hospitalization Follow-up (Pt here for hosp f/u visit. )   History of Present Illness:  Jonathan [name] is a 67 y.o. very pleasant male patient who presents with the following:  Following up from ER visit on 3/21- he reported near syncope/ dizziness History of stroke, DM He was given fluids, noted to have a glucose of 123  Labs reviewed.  BMP without any major electrolyte abnormalities.  Creatinine 1.25 which is baseline.  Blood sugar mildly elevated at 123, patient states that he is a "borderline diabetic."  Have discussed follow-up with his primary care doctor regarding further diabetic monitoring and management. CBC within normal limits.  Patient's symptoms today likely vertigo versus orthostatic hypotension in the setting of recent diarrhea and solely drinking coffee yesterday. Plan to have patient follow up with cardiology for follow up (has an appointment scheduled early next month) and potential need for future Holter monitoring. Counseled patient on return precautions.  Patient agrees and voiced understanding to the above plan. Discussed this patient with Dr. Vanita Panda who also saw the patient and agrees with plan and discharge.   Lab Results  Component Value Date   HGBA1C 6.6 (H) 03/02/2017   He is no longer having any diarrhea, he has cut back on his coffee and on his smoking and is trying to drink more water  BP Readings from Last 3 Encounters:  07/20/17 140/82  07/13/17 133/63  05/31/17 (!) 162/94   He notes that his glucose has been higher than normal for him since he got home He measured a glucoseof 160 at home recently   He does not have any known allergies to meds  No recent eye exam- "I need to " He also  has not been able to see ENT for his right ear problem- they are willing to reschedule this appt however  No fever or chills noted  Patient Active Problem List   Diagnosis Date Noted  . Otitis externa 11/23/2016  . Stroke, acute, embolic ([place]) 27/74/1287  . Type 2 diabetes mellitus ([place]) 04/01/2016  . Epigastric hernia 12/01/2015  . Allergic rhinitis 10/31/2012  . HLD (hyperlipidemia) 09/07/2011  . OSA (obstructive sleep apnea) 06/17/2011  . Insomnia 05/30/2011  . Malaise 04/01/2011  . Hearing loss 01/17/2011  . Knee pain, bilateral 11/18/2010  . H/O: asbestos exposure 11/18/2010  . Inguinal hernia, right 11/07/2010  . Essential hypertension 08/17/2010  . Bradycardia 08/17/2010  . Tobacco use disorder 08/17/2010    Past Medical History:  Diagnosis Date  . Cataract   . COPD (chronic obstructive pulmonary disease) ([place])   . Depression   . Diabetes mellitus without complication ([place])   . HTN (hypertension)   . Hyperlipidemia   . Sleep apnea   . Stroke [facility])     Past Surgical History:  Procedure Laterality Date  . CIRCUMCISION    . none      Social History   Tobacco Use  . Smoking status: Current Every Day Smoker    Packs/day: 0.50    Years: 43.00    Pack years: 21.50    Types: Cigarettes  . Smokeless tobacco: Never Used  . Tobacco comment: pt is trying to aggressively cut  back on # cigs smoked daily.   Substance Use Topics  . Alcohol use: No  . Drug use: No    Family History  Problem Relation Age of Onset  . Cancer Mother   . Heart disease Father     No Known Allergies  Medication list has been reviewed and updated.  Current Outpatient Medications on File Prior to Visit  Medication Sig Dispense Refill  . acetic acid-hydrocortisone (VOSOL-HC) OTIC solution Place 3 drops 4 (four) times daily into the right ear. 10 mL 0  . amLODipine (NORVASC) 10 MG tablet Take 1 tablet (10 mg total) by mouth daily. 90 tablet 3  . amoxicillin-clavulanate (AUGMENTIN)  875-125 MG tablet Take 1 tablet by mouth 2 (two) times daily. 20 tablet 0  . atorvastatin (LIPITOR) 40 MG tablet Take 1 tablet (40 mg total) by mouth daily. 90 tablet 3  . clopidogrel (PLAVIX) 75 MG tablet Take 1 tablet (75 mg total) daily by mouth. 90 tablet 3  . fexofenadine (ALLEGRA ALLERGY) 180 MG tablet Take 1 tablet (180 mg total) by mouth daily. 30 tablet 3  . fluticasone (FLONASE) 50 MCG/ACT nasal spray Place 1 spray into both nostrils daily. 16 g 2  . lisinopril (PRINIVIL,ZESTRIL) 5 MG tablet Take 1 tablet (5 mg total) daily by mouth. 90 tablet 3  . triamterene-hydrochlorothiazide (MAXZIDE-25) 37.5-25 MG tablet Take 1 tablet daily by mouth. 30 tablet 3  . VENTOLIN HFA 108 (90 Base) MCG/ACT inhaler INHALE 2 PUFFS BY MOUTH EVERY 4 HOURS AS NEEDED FOR WHEEZING OR SHORTNESS OF BREATH. 18 each 2   No current facility-administered medications on file prior to visit.     Review of Systems:  As per HPI- otherwise negative.   Physical Examination: Vitals:   07/20/17 1137  BP: 140/82  Pulse: 67  Temp: 98.4 F (36.9 C)  SpO2: 97%   Vitals:   07/20/17 1137  Weight: 201 lb 6.4 oz (91.4 kg)  Height: 5' 6.5" (1.689 m)   Body mass index is 32.02 kg/m. Ideal Body Weight: Weight in (lb) to have BMI = 25: 156.9  GEN: WDWN, NAD, Non-toxic, A & O x 3, overweight, otherwise looks well HEENT: Atraumatic, Normocephalic. Neck supple. No masses, No LAD.  Right TM shows a defect of the TM and chronic white discoloration of the TM Ears and Nose: No external deformity. CV: RRR, No M/G/R. No JVD. No thrill. No extra heart sounds. PULM: CTA B, no wheezes, crackles, rhonchi. No retractions. No resp. distress. No accessory muscle use. EXTR: No c/c/e NEURO Normal gait.  PSYCH: Normally interactive. Conversant. Not depressed or anxious appearing.  Calm demeanor.    Assessment and Plan: Type 2 diabetes mellitus without complication, without long-term current use of insulin ([place]) - Plan: Basic  metabolic panel, Hemoglobin A1c  Right otitis media, unspecified otitis media type  Following up from ER visit today It seems that he got dehydrated However there is also a question of his diet controlled diabetes now needing medication Will check A1c for him and call Encouraged him to see ENT- I do not know how to resolve his ear problem and he really needs specialist care Also reminded about some overdue health maint today  Signed Lamar Blinks, MD  Received his labs 3/29, his wife Helene Kelp had called asking about them Called her back- we will start Jonathan [name] on metformin for his DM.  Take daily for one week then BID Will rx to his drug store, please come and see me in about  4 months for a recheck  Results for orders placed or performed in visit on 21/30/86  Basic metabolic panel  Result Value Ref Range   Sodium 135 135 - 145 mEq/L   Potassium 3.8 3.5 - 5.1 mEq/L   Chloride 102 96 - 112 mEq/L   CO2 25 19 - 32 mEq/L   Glucose, Bld 173 (H) 70 - 99 mg/dL   BUN 26 (H) 6 - 23 mg/dL   Creatinine, Ser 1.08 0.40 - 1.50 mg/dL   Calcium 9.2 8.4 - 10.5 mg/dL   GFR 72.67 >60.00 mL/min  Hemoglobin A1c  Result Value Ref Range   Hgb A1c MFr Bld 7.1 (H) 4.6 - 6.5 %

## 2017-07-20 NOTE — Patient Instructions (Signed)
Good to see you today- likely we will start you on some oral metformin for your diabetes, I will get an A1c today and be in touch with you asap     I would encourage you to have  -eye exam -colonoscopy for colon cancer screening -visit with ENT about your right ear

## 2017-07-21 ENCOUNTER — Telehealth: Payer: Self-pay | Admitting: Emergency Medicine

## 2017-07-21 MED ORDER — METFORMIN HCL 500 MG PO TABS: 500.0000 mg | ORAL_TABLET | Freq: Two times a day (BID) | ORAL | 3 refills | Status: DC

## 2017-07-21 NOTE — Telephone Encounter (Signed)
Copied from [place] [id number]. Topic: Inquiry >> Jul 21, 2017  3:26 PM Arletha Grippe wrote: Reason for [phone] Wife call - please call about labs for pt

## 2017-07-27 ENCOUNTER — Encounter: Payer: Self-pay | Admitting: Family Medicine

## 2017-08-03 ENCOUNTER — Telehealth: Payer: Self-pay | Admitting: *Deleted

## 2017-08-03 ENCOUNTER — Ambulatory Visit: Payer: Self-pay | Admitting: Neurology

## 2017-08-03 NOTE — Telephone Encounter (Signed)
No showed new patient appointment. 

## 2017-08-04 ENCOUNTER — Encounter: Payer: Self-pay | Admitting: Neurology

## 2017-11-11 DIAGNOSIS — E109 Type 1 diabetes mellitus without complications: Secondary | ICD-10-CM | POA: Diagnosis not present

## 2017-11-11 DIAGNOSIS — Z0100 Encounter for examination of eyes and vision without abnormal findings: Secondary | ICD-10-CM | POA: Diagnosis not present

## 2017-12-06 ENCOUNTER — Telehealth: Payer: Self-pay | Admitting: *Deleted

## 2017-12-06 NOTE — Telephone Encounter (Signed)
Received Cologuard Order Cancellation: [phone]; order has been Cancelled because it has been Inactive, and has exceeded the 365 days from the Initial order; forwarded to provider/SLS 08/14

## 2018-01-13 NOTE — Progress Notes (Deleted)
[place] at [facility] [address], [place], [place] [address] [phone]  Date:  01/18/2018   Name:  Jonathan [name]   DOB:  August 06, 1950   MRN:  [id number]  PCP:  Darreld Mclean, MD    Chief Complaint: No chief complaint on file.   History of Present Illness:  Jonathan [name] is a 67 y.o. very pleasant male patient who presents with the following:  Following up today Married to my patient Jonathan [name] History of stroke, DM, hyperlipidemia, OSA, HTN, tobacco use  Last seen here in March at which time we added metformin to his diet control for his DM  Lab Results  Component Value Date   HGBA1C 7.1 (H) 07/20/2017  labs: Immun:  Flu shot due Eye exam: Colon:   Patient Active Problem List   Diagnosis Date Noted  . Stroke, acute, embolic ([place]) 80/99/8338  . Type 2 diabetes mellitus ([place]) 04/01/2016  . Epigastric hernia 12/01/2015  . Allergic rhinitis 10/31/2012  . HLD (hyperlipidemia) 09/07/2011  . OSA (obstructive sleep apnea) 06/17/2011  . Insomnia 05/30/2011  . Malaise 04/01/2011  . Hearing loss 01/17/2011  . Knee pain, bilateral 11/18/2010  . H/O: asbestos exposure 11/18/2010  . Inguinal hernia, right 11/07/2010  . Essential hypertension 08/17/2010  . Bradycardia 08/17/2010  . Tobacco use disorder 08/17/2010    Past Medical History:  Diagnosis Date  . Cataract   . COPD (chronic obstructive pulmonary disease) ([place])   . Depression   . Diabetes mellitus without complication ([place])   . HTN (hypertension)   . Hyperlipidemia   . Sleep apnea   . Stroke [facility])     Past Surgical History:  Procedure Laterality Date  . CIRCUMCISION    . none      Social History   Tobacco Use  . Smoking status: Current Every Day Smoker    Packs/day: 0.50    Years: 43.00    Pack years: 21.50    Types: Cigarettes  . Smokeless tobacco: Never Used  . Tobacco comment: pt is trying to aggressively cut back on # cigs smoked daily.    Substance Use Topics  . Alcohol use: No  . Drug use: No    Family History  Problem Relation Age of Onset  . Cancer Mother   . Heart disease Father     No Known Allergies  Medication list has been reviewed and updated.  Current Outpatient Medications on File Prior to Visit  Medication Sig Dispense Refill  . acetic acid-hydrocortisone (VOSOL-HC) OTIC solution Place 3 drops 4 (four) times daily into the right ear. 10 mL 0  . amLODipine (NORVASC) 10 MG tablet Take 1 tablet (10 mg total) by mouth daily. 90 tablet 3  . amoxicillin-clavulanate (AUGMENTIN) 875-125 MG tablet Take 1 tablet by mouth 2 (two) times daily. 20 tablet 0  . atorvastatin (LIPITOR) 40 MG tablet Take 1 tablet (40 mg total) by mouth daily. 90 tablet 3  . clopidogrel (PLAVIX) 75 MG tablet Take 1 tablet (75 mg total) daily by mouth. 90 tablet 3  . fexofenadine (ALLEGRA ALLERGY) 180 MG tablet Take 1 tablet (180 mg total) by mouth daily. 30 tablet 3  . fluticasone (FLONASE) 50 MCG/ACT nasal spray Place 1 spray into both nostrils daily. 16 g 2  . lisinopril (PRINIVIL,ZESTRIL) 5 MG tablet Take 1 tablet (5 mg total) daily by mouth. 90 tablet 3  . metFORMIN (GLUCOPHAGE) 500 MG tablet Take 1 tablet (  500 mg total) by mouth 2 (two) times daily with a meal. 180 tablet 3  . triamterene-hydrochlorothiazide (MAXZIDE-25) 37.5-25 MG tablet Take 1 tablet daily by mouth. 30 tablet 3  . VENTOLIN HFA 108 (90 Base) MCG/ACT inhaler INHALE 2 PUFFS BY MOUTH EVERY 4 HOURS AS NEEDED FOR WHEEZING OR SHORTNESS OF BREATH. 18 each 2   No current facility-administered medications on file prior to visit.     Review of Systems:  As per HPI- otherwise negative.   Physical Examination: There were no vitals filed for this visit. There were no vitals filed for this visit. There is no height or weight on file to calculate BMI. Ideal Body Weight:    GEN: WDWN, NAD, Non-toxic, A & O x 3 HEENT: Atraumatic, Normocephalic. Neck supple. No masses, No  LAD. Ears and Nose: No external deformity. CV: RRR, No M/G/R. No JVD. No thrill. No extra heart sounds. PULM: CTA B, no wheezes, crackles, rhonchi. No retractions. No resp. distress. No accessory muscle use. ABD: S, NT, ND, +BS. No rebound. No HSM. EXTR: No c/c/e NEURO Normal gait.  PSYCH: Normally interactive. Conversant. Not depressed or anxious appearing.  Calm demeanor.    Assessment and Plan: ***  Signed Lamar Blinks, MD

## 2018-01-18 ENCOUNTER — Ambulatory Visit: Payer: Medicare Other | Admitting: Family Medicine

## 2018-01-22 ENCOUNTER — Other Ambulatory Visit: Payer: Self-pay

## 2018-01-22 NOTE — Patient Outreach (Signed)
[place] [facility]) Care Management  01/22/2018  Jonathan [name] 1950-11-15 [id number]   Medication Adherence call to Jonathan [name] left a message for patient to call back patient is due on Lisinopril 5 mg. Jonathan [name] is showing past due under [place].

## 2018-01-22 NOTE — Progress Notes (Addendum)
[place] [facility] [address], [place], [place] [address] [phone]  Date:  01/25/2018   Name:  Jonathan [name]   DOB:  July 02, 1950   MRN:  [id number]  PCP:  Darreld Mclean, MD    Chief Complaint: Diabetes (follow up); Medication Management; and Flu Vaccine   History of Present Illness:  Jonathan [name] is a 67 y.o. very pleasant male patient who presents with the following:  Scheduled for a visit today after a no-show on Monday of this week.  History of DM, stroke, OSA, tobacco abuse   Needs A1c, other labs Flu- will do shot today  Eye exam; unsure Colon cancer screening: Pt reports that he did cologuard and that it was negative- last year.  I don't have this report at all but pt feels sure that this was done   He is not taking his amlodipine as he cannot afford to get all his meds He has also been out of his Maxzide for about 2 weeks He is out of plavix- needs this refilled He has been out of amlodipine for 3 weeks he estimates  His BP is high but not terrible- we decided to consolidate him onto just lisinopril as he is not able to afford 3 BP meds which he reports cost at $4 a month.    He does have a pack of [place] in his breast pocket    Lab Results  Component Value Date   HGBA1C 7.1 (H) 07/20/2017     Patient Active Problem List   Diagnosis Date Noted  . Stroke, acute, embolic ([place]) 29/47/6546  . Type 2 diabetes mellitus ([place]) 04/01/2016  . Epigastric hernia 12/01/2015  . Allergic rhinitis 10/31/2012  . HLD (hyperlipidemia) 09/07/2011  . OSA (obstructive sleep apnea) 06/17/2011  . Insomnia 05/30/2011  . Malaise 04/01/2011  . Hearing loss 01/17/2011  . Knee pain, bilateral 11/18/2010  . H/O: asbestos exposure 11/18/2010  . Inguinal hernia, right 11/07/2010  . Essential hypertension 08/17/2010  . Bradycardia 08/17/2010  . Tobacco use disorder 08/17/2010    Past Medical History:   Diagnosis Date  . Cataract   . COPD (chronic obstructive pulmonary disease) ([place])   . Depression   . Diabetes mellitus without complication ([place])   . HTN (hypertension)   . Hyperlipidemia   . Sleep apnea   . Stroke [facility])     Past Surgical History:  Procedure Laterality Date  . CIRCUMCISION    . none      Social History   Tobacco Use  . Smoking status: Current Every Day Smoker    Packs/day: 0.50    Years: 43.00    Pack years: 21.50    Types: Cigarettes  . Smokeless tobacco: Never Used  . Tobacco comment: pt is trying to aggressively cut back on # cigs smoked daily.   Substance Use Topics  . Alcohol use: No  . Drug use: No    Family History  Problem Relation Age of Onset  . Cancer Mother   . Heart disease Father     No Known Allergies  Medication list has been reviewed and updated.  Current Outpatient Medications on File Prior to Visit  Medication Sig Dispense Refill  . atorvastatin (LIPITOR) 40 MG tablet Take 1 tablet (40 mg total) by mouth daily. 90 tablet 3  . fexofenadine (ALLEGRA ALLERGY) 180 MG tablet Take 1 tablet (180 mg total) by mouth daily. 30 tablet  3  . fluticasone (FLONASE) 50 MCG/ACT nasal spray Place 1 spray into both nostrils daily. 16 g 2  . lisinopril (PRINIVIL,ZESTRIL) 5 MG tablet Take 1 tablet (5 mg total) daily by mouth. 90 tablet 3  . metFORMIN (GLUCOPHAGE) 500 MG tablet Take 1 tablet (500 mg total) by mouth 2 (two) times daily with a meal. 180 tablet 3  . triamterene-hydrochlorothiazide (MAXZIDE-25) 37.5-25 MG tablet Take 1 tablet daily by mouth. 30 tablet 3  . amLODipine (NORVASC) 10 MG tablet Take 1 tablet (10 mg total) by mouth daily. (Patient not taking: Reported on 01/25/2018) 90 tablet 3  . clopidogrel (PLAVIX) 75 MG tablet Take 1 tablet (75 mg total) daily by mouth. (Patient not taking: Reported on 01/25/2018) 90 tablet 3  . VENTOLIN HFA 108 (90 Base) MCG/ACT inhaler INHALE 2 PUFFS BY MOUTH EVERY 4 HOURS AS NEEDED FOR WHEEZING OR  SHORTNESS OF BREATH. (Patient not taking: Reported on 01/25/2018) 18 each 2   No current facility-administered medications on file prior to visit.     Review of Systems:  As per HPI- otherwise negative. No change in his condition as far as he is aware    Physical Examination: Vitals:   01/25/18 0950  BP: (!) 158/90  Pulse: (!) 56  Resp: 18  Temp: 98.4 F (36.9 C)  SpO2: 98%   Vitals:   01/25/18 0950  Weight: 204 lb (92.5 kg)  Height: 5' 6.5" (1.689 m)   Body mass index is 32.43 kg/m. Ideal Body Weight: Weight in (lb) to have BMI = 25: 156.9  GEN: WDWN, NAD, Non-toxic, A & O x 3, mild obesity, smoker  HEENT: Atraumatic, Normocephalic. Neck supple. No masses, No LAD. Ears and Nose: No external deformity. CV: RRR, No M/G/R. No JVD. No thrill. No extra heart sounds. PULM: CTA B, no wheezes, crackles, rhonchi. No retractions. No resp. distress. No accessory muscle use. ABD: S, NT, ND EXTR: No c/c/e NEURO Normal gait.  PSYCH: Normally interactive. Conversant. Not depressed or anxious appearing.  Calm demeanor.    Assessment and Plan: Type 2 diabetes mellitus without complication, without long-term current use of insulin ([place]) - Plan: Comprehensive metabolic panel, Hemoglobin A1c, CANCELED: Basic metabolic panel  Tobacco abuse  Hyperlipidemia associated with type 2 diabetes mellitus ([place]) - Plan: Lipid panel  History of CVA (cerebrovascular accident) - Plan: clopidogrel (PLAVIX) 75 MG tablet  Essential hypertension - Plan: CBC, lisinopril (PRINIVIL,ZESTRIL) 20 MG tablet, CANCELED: Basic metabolic panel  Immunization due  Need for influenza vaccination - Plan: Flu vaccine HIGH DOSE PF (Fluzone High dose)  Labs as above to monitor his DM He is still smoking but is trying to quit Really encouraged him to get back on his plavix Will consolidate BP meds to just lisinopril 20 mg; he will come in for a nurse visit in 2 weeks to see how this is working for him Flu shot  given  Good to see you today- thank you for coming to your scheduled appointment Let's increase your lisinopril dose to 20 mg a day for blood pressure- this way we will have you stop the amlodipine and fluid pill. This will save money Please schedule a nurse visit for about 2 weeks from now so we can see how your blood pressure looks  The plavix - blood thinner- is important!  Please start back on this medication I will be in touch with your labs asap Please see if you can find any record of your Cologuard results so I can  document this for you  Please plan to see me in about 4 months   Signed Lamar Blinks, MD  Received his labs, letter to pt  Results for orders placed or performed in visit on 01/25/18  CBC  Result Value Ref Range   WBC 7.0 4.0 - 10.5 K/uL   RBC 4.79 4.22 - 5.81 Mil/uL   Platelets 232.0 150.0 - 400.0 K/uL   Hemoglobin 14.3 13.0 - 17.0 g/dL   HCT 43.4 39.0 - 52.0 %   MCV 90.6 78.0 - 100.0 fl   MCHC 32.9 30.0 - 36.0 g/dL   RDW 14.1 11.5 - 15.5 %  Comprehensive metabolic panel  Result Value Ref Range   Sodium 140 135 - 145 mEq/L   Potassium 4.4 3.5 - 5.1 mEq/L   Chloride 108 96 - 112 mEq/L   CO2 26 19 - 32 mEq/L   Glucose, Bld 107 (H) 70 - 99 mg/dL   BUN 18 6 - 23 mg/dL   Creatinine, Ser 1.34 0.40 - 1.50 mg/dL   Total Bilirubin 0.4 0.2 - 1.2 mg/dL   Alkaline Phosphatase 94 39 - 117 U/L   AST 20 0 - 37 U/L   ALT 39 0 - 53 U/L   Total Protein 7.1 6.0 - 8.3 g/dL   Albumin 4.2 3.5 - 5.2 g/dL   Calcium 9.2 8.4 - 10.5 mg/dL   GFR 56.56 (L) >60.00 mL/min  Hemoglobin A1c  Result Value Ref Range   Hgb A1c MFr Bld 7.2 (H) 4.6 - 6.5 %  Lipid panel  Result Value Ref Range   Cholesterol 132 0 - 200 mg/dL   Triglycerides 233.0 (H) 0.0 - 149.0 mg/dL   HDL 24.80 (L) >39.00 mg/dL   VLDL 46.6 (H) 0.0 - 40.0 mg/dL   Total CHOL/HDL Ratio 5    NonHDL 107.33   LDL cholesterol, direct  Result Value Ref Range   Direct LDL 81.0 mg/dL

## 2018-01-25 ENCOUNTER — Encounter: Payer: Self-pay | Admitting: Family Medicine

## 2018-01-25 ENCOUNTER — Ambulatory Visit (INDEPENDENT_AMBULATORY_CARE_PROVIDER_SITE_OTHER): Payer: Medicare Other | Admitting: Family Medicine

## 2018-01-25 VITALS — BP 158/90 | HR 56 | Temp 98.4°F | Resp 18 | Ht 66.5 in | Wt 204.0 lb

## 2018-01-25 DIAGNOSIS — Z8673 Personal history of transient ischemic attack (TIA), and cerebral infarction without residual deficits: Secondary | ICD-10-CM

## 2018-01-25 DIAGNOSIS — Z23 Encounter for immunization: Secondary | ICD-10-CM

## 2018-01-25 DIAGNOSIS — E119 Type 2 diabetes mellitus without complications: Secondary | ICD-10-CM

## 2018-01-25 DIAGNOSIS — Z720 Tobacco use: Secondary | ICD-10-CM | POA: Diagnosis not present

## 2018-01-25 DIAGNOSIS — E785 Hyperlipidemia, unspecified: Secondary | ICD-10-CM | POA: Diagnosis not present

## 2018-01-25 DIAGNOSIS — E1169 Type 2 diabetes mellitus with other specified complication: Secondary | ICD-10-CM | POA: Diagnosis not present

## 2018-01-25 DIAGNOSIS — I10 Essential (primary) hypertension: Secondary | ICD-10-CM

## 2018-01-25 LAB — COMPREHENSIVE METABOLIC PANEL
ALBUMIN: 4.2 g/dL (ref 3.5–5.2)
ALK PHOS: 94 U/L (ref 39–117)
ALT: 39 U/L (ref 0–53)
AST: 20 U/L (ref 0–37)
BILIRUBIN TOTAL: 0.4 mg/dL (ref 0.2–1.2)
BUN: 18 mg/dL (ref 6–23)
CO2: 26 meq/L (ref 19–32)
CREATININE: 1.34 mg/dL (ref 0.40–1.50)
Calcium: 9.2 mg/dL (ref 8.4–10.5)
Chloride: 108 mEq/L (ref 96–112)
GFR: 56.56 mL/min — AB (ref >60.00)
Glucose, Bld: 107 mg/dL — ABNORMAL HIGH (ref 70–99)
Potassium: 4.4 mEq/L (ref 3.5–5.1)
Sodium: 140 mEq/L (ref 135–145)
TOTAL PROTEIN: 7.1 g/dL (ref 6.0–8.3)

## 2018-01-25 LAB — CBC
HCT: 43.4 % (ref 39.0–52.0)
HEMOGLOBIN: 14.3 g/dL (ref 13.0–17.0)
MCHC: 32.9 g/dL (ref 30.0–36.0)
MCV: 90.6 fl (ref 78.0–100.0)
PLATELETS: 232 10*3/uL (ref 150.0–400.0)
RBC: 4.79 Mil/uL (ref 4.22–5.81)
RDW: 14.1 % (ref 11.5–15.5)
WBC: 7 10*3/uL (ref 4.0–10.5)

## 2018-01-25 LAB — HEMOGLOBIN A1C: Hgb A1c MFr Bld: 7.2 % — ABNORMAL HIGH (ref 4.6–6.5)

## 2018-01-25 LAB — LIPID PANEL
CHOL/HDL RATIO: 5
CHOLESTEROL: 132 mg/dL (ref 0–200)
HDL: 24.8 mg/dL — AB (ref >39.00)
NONHDL: 107.33
Triglycerides: 233 mg/dL — ABNORMAL HIGH (ref 0.0–149.0)
VLDL: 46.6 mg/dL — AB (ref 0.0–40.0)

## 2018-01-25 LAB — LDL CHOLESTEROL, DIRECT: LDL DIRECT: 81 mg/dL

## 2018-01-25 MED ORDER — LISINOPRIL 20 MG PO TABS: 20.0000 mg | ORAL_TABLET | Freq: Every day | ORAL | 3 refills | Status: DC

## 2018-01-25 MED ORDER — CLOPIDOGREL BISULFATE 75 MG PO TABS: 75.0000 mg | ORAL_TABLET | Freq: Every day | ORAL | 3 refills | Status: DC

## 2018-01-25 NOTE — Patient Instructions (Addendum)
Good to see you today- thank you for coming to your scheduled appointment  Let's increase your lisinopril dose to 20 mg a day for blood pressure- this way we will have you stop the amlodipine and fluid pill. This will save money   Please schedule a nurse visit for about 2 weeks from now so we can see how your blood pressure looks  The plavix - blood thinner- is important!  Please start back on this medication I will be in touch with your labs asap   Please see if you can find any record of your Cologuard results so I can document this for you   Please plan to see me in about 4 months

## 2018-02-09 ENCOUNTER — Ambulatory Visit: Payer: Self-pay

## 2018-05-24 NOTE — Progress Notes (Deleted)
Subjective:    Patient ID: Jonathan [name], male    DOB: 1950-11-27, 68 y.o.   MRN: [id number]  HPI:  Jonathan [name] is here to establish as a new pt.  He is a pleasant 68 year old male. PMH:HTN, Bradycardia, HLD, OSA, T2D, CVA 07/2017- No Show to Neurology Appt   Patient Care Team    Relationship Specialty Notifications Start End  Copland, Gay Filler, MD PCP - General Family Medicine  11/28/16     Patient Active Problem List   Diagnosis Date Noted  . Stroke, acute, embolic ([place]) 63/87/5643  . Type 2 diabetes mellitus ([place]) 04/01/2016  . Epigastric hernia 12/01/2015  . Allergic rhinitis 10/31/2012  . HLD (hyperlipidemia) 09/07/2011  . OSA (obstructive sleep apnea) 06/17/2011  . Insomnia 05/30/2011  . Malaise 04/01/2011  . Hearing loss 01/17/2011  . Knee pain, bilateral 11/18/2010  . H/O: asbestos exposure 11/18/2010  . Inguinal hernia, right 11/07/2010  . Essential hypertension 08/17/2010  . Bradycardia 08/17/2010  . Tobacco use disorder 08/17/2010     Past Medical History:  Diagnosis Date  . Cataract   . COPD (chronic obstructive pulmonary disease) ([place])   . Depression   . Diabetes mellitus without complication ([place])   . HTN (hypertension)   . Hyperlipidemia   . Sleep apnea   . Stroke [facility])      Past Surgical History:  Procedure Laterality Date  . CIRCUMCISION    . none       Family History  Problem Relation Age of Onset  . Cancer Mother   . Heart disease Father      Social History   Substance and Sexual Activity  Drug Use No     Social History   Substance and Sexual Activity  Alcohol Use No     Social History   Tobacco Use  Smoking Status Current Every Day Smoker  . Packs/day: 0.50  . Years: 43.00  . Pack years: 21.50  . Types: Cigarettes  Smokeless Tobacco Never Used  Tobacco Comment   pt is trying to aggressively cut back on # cigs smoked daily.      Outpatient Encounter Medications as of 06/04/2018  Medication Sig  . atorvastatin  (LIPITOR) 40 MG tablet Take 1 tablet (40 mg total) by mouth daily.  . clopidogrel (PLAVIX) 75 MG tablet Take 1 tablet (75 mg total) by mouth daily.  . fexofenadine (ALLEGRA ALLERGY) 180 MG tablet Take 1 tablet (180 mg total) by mouth daily.  . fluticasone (FLONASE) 50 MCG/ACT nasal spray Place 1 spray into both nostrils daily.  Marland Kitchen lisinopril (PRINIVIL,ZESTRIL) 20 MG tablet Take 1 tablet (20 mg total) by mouth daily.  . metFORMIN (GLUCOPHAGE) 500 MG tablet Take 1 tablet (500 mg total) by mouth 2 (two) times daily with a meal.  . VENTOLIN HFA 108 (90 Base) MCG/ACT inhaler INHALE 2 PUFFS BY MOUTH EVERY 4 HOURS AS NEEDED FOR WHEEZING OR SHORTNESS OF BREATH. (Patient not taking: Reported on 01/25/2018)   No facility-administered encounter medications on file as of 06/04/2018.     Allergies: Patient has no known allergies.  There is no height or weight on file to calculate BMI.  There were no vitals taken for this visit.     Review of Systems     Objective:   Physical Exam        Assessment & Plan:  No diagnosis found.  No problem-specific Assessment & Plan notes found for this encounter.    FOLLOW-UP:  No  follow-ups on file.

## 2018-05-26 NOTE — Progress Notes (Deleted)
[place] at [facility] [address] [place], [place] [address] [phone]  Date:  05/28/2018   Name:  Jonathan [name]   DOB:  07/20/1950   MRN:  [id number]  PCP:  Darreld Mclean, MD    Chief Complaint: No chief complaint on file.   History of Present Illness:  Jonathan [name] is a 68 y.o. very pleasant male patient who presents with the following:  Here today for complete physical.  History of hypertension, stroke, sleep apnea, diabetes, tobacco abuse I have also been taking care of his wife, we had to dismiss him from the practice due to excessive no-shows.  I last saw him in October At that time we changed his blood pressure medications to just lisinopril, as he is having difficulty affording his medications.  I have asked him to come in for a nurse visit in 2 weeks to check blood pressure, but he has not come back in A1c was reasonable at 7.2%  Eye exam: Due for A1c  Patient did not come in today.  We assume has decided to find another provider. I will remove myself as PCP  Patient Active Problem List   Diagnosis Date Noted  . Stroke, acute, embolic ([place]) 48/27/0786  . Type 2 diabetes mellitus ([place]) 04/01/2016  . Epigastric hernia 12/01/2015  . Allergic rhinitis 10/31/2012  . HLD (hyperlipidemia) 09/07/2011  . OSA (obstructive sleep apnea) 06/17/2011  . Insomnia 05/30/2011  . Malaise 04/01/2011  . Hearing loss 01/17/2011  . Knee pain, bilateral 11/18/2010  . H/O: asbestos exposure 11/18/2010  . Inguinal hernia, right 11/07/2010  . Essential hypertension 08/17/2010  . Bradycardia 08/17/2010  . Tobacco use disorder 08/17/2010    Past Medical History:  Diagnosis Date  . Cataract   . COPD (chronic obstructive pulmonary disease) ([place])   . Depression   . Diabetes mellitus without complication ([place])   . HTN (hypertension)   . Hyperlipidemia   . Sleep apnea   . Stroke [facility])     Past Surgical History:   Procedure Laterality Date  . CIRCUMCISION    . none      Social History   Tobacco Use  . Smoking status: Current Every Day Smoker    Packs/day: 0.50    Years: 43.00    Pack years: 21.50    Types: Cigarettes  . Smokeless tobacco: Never Used  . Tobacco comment: pt is trying to aggressively cut back on # cigs smoked daily.   Substance Use Topics  . Alcohol use: No  . Drug use: No    Family History  Problem Relation Age of Onset  . Cancer Mother   . Heart disease Father     No Known Allergies  Medication list has been reviewed and updated.  Current Outpatient Medications on File Prior to Visit  Medication Sig Dispense Refill  . atorvastatin (LIPITOR) 40 MG tablet Take 1 tablet (40 mg total) by mouth daily. 90 tablet 3  . clopidogrel (PLAVIX) 75 MG tablet Take 1 tablet (75 mg total) by mouth daily. 90 tablet 3  . fexofenadine (ALLEGRA ALLERGY) 180 MG tablet Take 1 tablet (180 mg total) by mouth daily. 30 tablet 3  . fluticasone (FLONASE) 50 MCG/ACT nasal spray Place 1 spray into both nostrils daily. 16 g 2  . lisinopril (PRINIVIL,ZESTRIL) 20 MG tablet Take 1 tablet (20 mg total) by mouth daily. 90 tablet 3  . metFORMIN (GLUCOPHAGE) 500 MG tablet  Take 1 tablet (500 mg total) by mouth 2 (two) times daily with a meal. 180 tablet 3  . VENTOLIN HFA 108 (90 Base) MCG/ACT inhaler INHALE 2 PUFFS BY MOUTH EVERY 4 HOURS AS NEEDED FOR WHEEZING OR SHORTNESS OF BREATH. (Patient not taking: Reported on 01/25/2018) 18 each 2   No current facility-administered medications on file prior to visit.     Review of Systems:    Physical Examination: There were no vitals filed for this visit. There were no vitals filed for this visit. There is no height or weight on file to calculate BMI. Ideal Body Weight:      Assessment and Plan: No show   Signed Lamar Blinks, MD

## 2018-05-28 ENCOUNTER — Ambulatory Visit: Payer: Medicare Other | Admitting: Family Medicine

## 2018-05-28 DIAGNOSIS — Z0289 Encounter for other administrative examinations: Secondary | ICD-10-CM

## 2018-06-04 ENCOUNTER — Ambulatory Visit: Payer: Self-pay | Admitting: Adult Health

## 2018-06-28 ENCOUNTER — Ambulatory Visit: Payer: Medicare Other | Admitting: Adult Health

## 2018-06-28 NOTE — Progress Notes (Deleted)
   Subjective:    Patient ID: TAFARI HUMISTON, male    DOB: [birth year], 68 y.o.   MRN: [id number]  HPI:  Ms. Quinney is here to establish as a new pt.  He is a pleasant 68 year old male. PMH: T2D, HLD, OSA, HTD, Insomnia, Hx of acute embolic CVA   Patient Care Team    Relationship Specialty Notifications Start End  Patient, No Pcp Per PCP - General General Practice  05/28/18     Patient Active Problem List   Diagnosis Date Noted  . Stroke, acute, embolic ([place]) 75/17/0017  . Type 2 diabetes mellitus ([place]) 04/01/2016  . Epigastric hernia 12/01/2015  . Allergic rhinitis 10/31/2012  . HLD (hyperlipidemia) 09/07/2011  . OSA (obstructive sleep apnea) 06/17/2011  . Insomnia 05/30/2011  . Malaise 04/01/2011  . Hearing loss 01/17/2011  . Knee pain, bilateral 11/18/2010  . H/O: asbestos exposure 11/18/2010  . Inguinal hernia, right 11/07/2010  . Essential hypertension 08/17/2010  . Bradycardia 08/17/2010  . Tobacco use disorder 08/17/2010     Past Medical History:  Diagnosis Date  . Cataract   . COPD (chronic obstructive pulmonary disease) ([place])   . Depression   . Diabetes mellitus without complication ([place])   . HTN (hypertension)   . Hyperlipidemia   . Sleep apnea   . Stroke [facility])      Past Surgical History:  Procedure Laterality Date  . CIRCUMCISION    . none       Family History  Problem Relation Age of Onset  . Cancer Mother   . Heart disease Father      Social History   Substance and Sexual Activity  Drug Use No     Social History   Substance and Sexual Activity  Alcohol Use No     Social History   Tobacco Use  Smoking Status Current Every Day Smoker  . Packs/day: 0.50  . Years: 43.00  . Pack years: 21.50  . Types: Cigarettes  Smokeless Tobacco Never Used  Tobacco Comment   pt is trying to aggressively cut back on # cigs smoked daily.      Outpatient Encounter Medications as of 06/28/2018  Medication Sig  . atorvastatin (LIPITOR) 40 MG  tablet Take 1 tablet (40 mg total) by mouth daily.  . clopidogrel (PLAVIX) 75 MG tablet Take 1 tablet (75 mg total) by mouth daily.  . fexofenadine (ALLEGRA ALLERGY) 180 MG tablet Take 1 tablet (180 mg total) by mouth daily.  . fluticasone (FLONASE) 50 MCG/ACT nasal spray Place 1 spray into both nostrils daily.  Marland Kitchen lisinopril (PRINIVIL,ZESTRIL) 20 MG tablet Take 1 tablet (20 mg total) by mouth daily.  . metFORMIN (GLUCOPHAGE) 500 MG tablet Take 1 tablet (500 mg total) by mouth 2 (two) times daily with a meal.  . VENTOLIN HFA 108 (90 Base) MCG/ACT inhaler INHALE 2 PUFFS BY MOUTH EVERY 4 HOURS AS NEEDED FOR WHEEZING OR SHORTNESS OF BREATH. (Patient not taking: Reported on 01/25/2018)   No facility-administered encounter medications on file as of 06/28/2018.     Allergies: Patient has no known allergies.  There is no height or weight on file to calculate BMI.  There were no vitals taken for this visit.     Review of Systems     Objective:   Physical Exam        Assessment & Plan:

## 2018-12-03 ENCOUNTER — Ambulatory Visit (INDEPENDENT_AMBULATORY_CARE_PROVIDER_SITE_OTHER): Payer: Medicare Other | Admitting: Family Medicine

## 2018-12-03 ENCOUNTER — Encounter: Payer: Self-pay | Admitting: Family Medicine

## 2018-12-03 ENCOUNTER — Other Ambulatory Visit: Payer: Self-pay

## 2018-12-03 VITALS — BP 138/72 | HR 54 | Wt 198.6 lb

## 2018-12-03 DIAGNOSIS — G4733 Obstructive sleep apnea (adult) (pediatric): Secondary | ICD-10-CM

## 2018-12-03 DIAGNOSIS — I10 Essential (primary) hypertension: Secondary | ICD-10-CM | POA: Diagnosis not present

## 2018-12-03 DIAGNOSIS — E1169 Type 2 diabetes mellitus with other specified complication: Secondary | ICD-10-CM | POA: Diagnosis not present

## 2018-12-03 DIAGNOSIS — F17200 Nicotine dependence, unspecified, uncomplicated: Secondary | ICD-10-CM

## 2018-12-03 DIAGNOSIS — E785 Hyperlipidemia, unspecified: Secondary | ICD-10-CM

## 2018-12-03 DIAGNOSIS — H269 Unspecified cataract: Secondary | ICD-10-CM

## 2018-12-03 DIAGNOSIS — H9211 Otorrhea, right ear: Secondary | ICD-10-CM

## 2018-12-03 DIAGNOSIS — E119 Type 2 diabetes mellitus without complications: Secondary | ICD-10-CM | POA: Diagnosis not present

## 2018-12-03 DIAGNOSIS — J309 Allergic rhinitis, unspecified: Secondary | ICD-10-CM

## 2018-12-03 LAB — POCT GLYCOSYLATED HEMOGLOBIN (HGB A1C): HbA1c, POC (controlled diabetic range): 6.3 % (ref 0.0–7.0)

## 2018-12-03 MED ORDER — ONETOUCH DELICA LANCING DEV MISC: 0 refills | Status: DC

## 2018-12-03 MED ORDER — ONETOUCH VERIO VI STRP: ORAL_STRIP | 3 refills | Status: DC

## 2018-12-03 MED ORDER — FLUTICASONE PROPIONATE 50 MCG/ACT NA SUSP: 1.0000 | Freq: Every day | NASAL | 2 refills | Status: DC

## 2018-12-03 MED ORDER — ONETOUCH DELICA LANCETS 33G MISC: 3 refills | Status: DC

## 2018-12-03 MED ORDER — ONETOUCH VERIO W/DEVICE KIT: PACK | 0 refills | Status: DC

## 2018-12-03 MED ORDER — FEXOFENADINE HCL 180 MG PO TABS: 180.0000 mg | ORAL_TABLET | Freq: Every day | ORAL | 3 refills | Status: DC

## 2018-12-03 MED ORDER — ATORVASTATIN CALCIUM 40 MG PO TABS: 40.0000 mg | ORAL_TABLET | Freq: Every day | ORAL | 1 refills | Status: DC

## 2018-12-03 NOTE — Progress Notes (Signed)
  HPI:  Patient presents today for a new patient appointment to establish general primary care. Previously was my patient but left for another office, now back to get re-established.  Prior PCP: Lamar Blinks MD, last seen 01/25/18  Other care team members: none  Concerns today:  - Needs atorvastatin, allegra, flonase refilled - Due for A1c. Taking metformin 500mg  twice daily. Needs new meter. - R ear with malodorous drainage for 2 years. Has not seen ENT. Has tried multiple antibiotics/drops. Feels it is impacting his hearing.  Requests referral to ophtho, diagnosed with cataracts in the past by an eye doctor, not an acute issue right now  ROS: See HPI  Past Medical Hx:  -cataracts -diabetes -stroke -OSA - does not use CPAP  Past Surgical Hx:  -none  Family Hx: updated in Epic  Social Hx:  - occupation: no work presently - highest level of education: high school - lives with: daughter Lenna Sciara & 2 granddtrs live with him and his wife Helene Kelp - tobacco: 1 ppd for 50 years - alcohol: none or occasional - drugs: no  Health Maintenance:  -need to discuss at next visit  PHYSICAL EXAM: BP 138/72   Pulse (!) 54   Wt 198 lb 9.6 oz (90.1 kg)   SpO2 97%   BMI 31.57 kg/m  Gen: no acute distress, pleasant, cooperative, well appearing HEENT: normocephalic, atraumatic. R tympanic membrane with white drainage/lesion Heart: regular rate and rhythm, no murmur Lungs: clear to auscultation bilaterally, normal work of breathing  Abdomen: soft nontender to palpation  Neuro: alert, speech normal Psych: normal range of affect, well groomed, speech normal in rate and volume, normal eye contact  Extremities: No appreciable lower extremity edema bilaterally   ASSESSMENT/PLAN:  Health maintenance:  -address at future visit   HLD (hyperlipidemia) Refill atorvastatin today. Current on lipids.  Essential hypertension Well controlled. Continue current regimen.   Tobacco use  disorder Not interested in quitting at this time.  OSA (obstructive sleep apnea) Does not use CPAP.  Allergic rhinitis Refill allegra & flonase  Type 2 diabetes mellitus ([place]) Well controlled on metformin. Continue. Follow up in 3 months.   R ear drainage - chronic, at risk for nectrotizing otitis externa given diabetes, refer to ENT for further assessment   Refer to ophtho as requested for cataracts   FOLLOW UP: Follow up in 3 months  for above issues  [place] J. Ardelia Mems, [place]

## 2018-12-03 NOTE — Patient Instructions (Addendum)
It was great to see you again today!  Referring to ENT for your ear  Put in referral to eye doctor for cataracts  Refilled atorvastatin, flonase, allegra  Sent in new blood sugar meter & supplies  Follow up with me in 3 months, sooner if needed  Be well, Dr. Ardelia Mems

## 2018-12-04 NOTE — Assessment & Plan Note (Signed)
Refill atorvastatin today. Current on lipids.

## 2018-12-04 NOTE — Assessment & Plan Note (Addendum)
Well controlled on metformin. Continue. Follow up in 3 months. Sent in glucometer & testing supplies

## 2018-12-04 NOTE — Assessment & Plan Note (Signed)
Refill allegra & flonase

## 2018-12-04 NOTE — Assessment & Plan Note (Signed)
Well-controlled.  Continue current regimen. 

## 2018-12-04 NOTE — Assessment & Plan Note (Signed)
Does not use CPAP. 

## 2018-12-04 NOTE — Assessment & Plan Note (Signed)
Not interested in quitting at this time

## 2018-12-13 ENCOUNTER — Telehealth: Payer: Self-pay | Admitting: *Deleted

## 2018-12-13 NOTE — Telephone Encounter (Signed)
Pts wife lm on nurse line.  "His sugars have been high".  Attempted to return call back, lmovm for callback. Christen Bame, CMA

## 2018-12-17 ENCOUNTER — Ambulatory Visit (INDEPENDENT_AMBULATORY_CARE_PROVIDER_SITE_OTHER): Payer: Medicare Other | Admitting: Family Medicine

## 2018-12-17 ENCOUNTER — Other Ambulatory Visit: Payer: Self-pay

## 2018-12-17 VITALS — BP 160/64 | HR 53

## 2018-12-17 DIAGNOSIS — E119 Type 2 diabetes mellitus without complications: Secondary | ICD-10-CM | POA: Diagnosis not present

## 2018-12-17 NOTE — Patient Instructions (Signed)
It was a pleasure to see you today! Thank you for choosing [facility] for your primary care. Jonathan [name] was seen for diabetes. Come back to the clinic in 3 months for another checkup.   Today we talked about diet changes that can help control your diabetes   Please bring all your medications to every doctors visit   Sign up for My Chart to have easy access to your labs results, and communication with your Primary care physician.     Please check-out at the front desk before leaving the clinic.     Best,  Dr. Sherene Sires FAMILY MEDICINE RESIDENT - PGY3 12/17/2018 11:28 AM

## 2018-12-17 NOTE — Telephone Encounter (Signed)
Patient had appointment today for this, closing this encounter Leeanne Rio, MD

## 2018-12-17 NOTE — Assessment & Plan Note (Signed)
Patient takes metformin, says that his blood sugars in his mind are generally under 100.  He is concerned because he has had some recent blood sugars for the last 2 or 3 days in the 160s.  Says that he "does not eat bad at all ".  Under further diet review find that he drinks multiple Mountain Dew's per day as well as mac & cheese biscuits with breakfast and baked potatoes on a regular basis.    His report of average blood sugars under 100 is not in line with his A1c of 6.3 with a prior A1c from October in the sevens.  We discussed how his average blood sugars are actually more likely in the 130s to 140s and therefore a few readings in the 160s or not out of line.  We also went over diet control and how potatoes biscuits and macaroni and cheese and back and do is not in line with attempting to be diet controlled.  We discussed that we can lower his A1c and blood sugars either with diet or with medicine with a diet would be preferable.  Patient agrees and decides to try with diet control.

## 2018-12-17 NOTE — Progress Notes (Signed)
    Subjective:  Jonathan [name] is a 68 y.o. male who presents to the [facility] today with a chief complaint of DM.   HPI: Type 2 diabetes mellitus ([place]) Patient takes metformin, says that his blood sugars in his mind are generally under 100.  He is concerned because he has had some recent blood sugars for the last 2 or 3 days in the 160s.  Says that he "does not eat bad at all ".  Under further diet review find that he drinks multiple Mountain Dew's per day as well as mac & cheese biscuits with breakfast and baked potatoes on a regular basis.    Objective:  Physical Exam: BP (!) 160/64   Pulse (!) 53   SpO2 97%   Gen: NAD, resting comfortably CV: Regular rate Pulm: NWOB, no cough, regular resp. rate MSK: no edema, cyanosis, or clubbing noted Skin: warm, dry Neuro: grossly normal, moves all extremities Psych: Normal affect and thought content  No results found for this or any previous visit (from the past 72 hour(s)).   Assessment/Plan:  Type 2 diabetes mellitus (Jonathan [name]) Patient takes metformin, says that his blood sugars in his mind are generally under 100.  He is concerned because he has had some recent blood sugars for the last 2 or 3 days in the 160s.  Says that he "does not eat bad at all ".  Under further diet review find that he drinks multiple Mountain Dew's per day as well as mac & cheese biscuits with breakfast and baked potatoes on a regular basis.    His report of average blood sugars under 100 is not in line with his A1c of 6.3 with a prior A1c from October in the sevens.  We discussed how his average blood sugars are actually more likely in the 130s to 140s and therefore a few readings in the 160s or not out of line.  We also went over diet control and how potatoes biscuits and macaroni and cheese and back and do is not in line with attempting to be diet controlled.  We discussed that we can lower his A1c and blood sugars either with diet or with medicine with a diet would be  preferable.  Patient agrees and decides to try with diet control.   Sherene Sires, DO FAMILY MEDICINE RESIDENT - PGY3 12/17/2018 1:59 PM

## 2019-01-03 ENCOUNTER — Ambulatory Visit (INDEPENDENT_AMBULATORY_CARE_PROVIDER_SITE_OTHER): Payer: Medicare Other | Admitting: Family Medicine

## 2019-01-03 ENCOUNTER — Other Ambulatory Visit: Payer: Self-pay

## 2019-01-03 VITALS — BP 164/80 | HR 67

## 2019-01-03 DIAGNOSIS — I10 Essential (primary) hypertension: Secondary | ICD-10-CM

## 2019-01-03 MED ORDER — AMLODIPINE BESYLATE 5 MG PO TABS: 5.0000 mg | ORAL_TABLET | Freq: Every day | ORAL | 3 refills | Status: DC

## 2019-01-03 NOTE — Assessment & Plan Note (Addendum)
Poorly controlled at today's visit with patient history stating it is usually "high at home".  - Restart amlodipine 5mg  daily at night time - Sleep study for OSA, could be contributing to HTN as patient states he used CPAP in the past - Advised to quit smoking; patient not ready to quit

## 2019-01-03 NOTE — Progress Notes (Signed)
     Subjective: No chief complaint on file.  HPI: Jonathan [name] is a 68 y.o. presenting to clinic today to discuss the following:  HTN Chronic. Uncontrolled. Patient is a active smoker who has uncontrolled HTN with history of stroke. He states he checks his BP at home "sometimes" and it is typically in the Q000111Q systolic and Q000111Q diastolic. Uncontrolled today at his office visit. Denies headache, changes in vision, chest pain or shortness of breath today.  ROS noted in HPI.    Social History   Tobacco Use  Smoking Status Current Every Day Smoker  . Packs/day: 0.50  . Years: 43.00  . Pack years: 21.50  . Types: Cigarettes  Smokeless Tobacco Never Used  Tobacco Comment   pt is trying to aggressively cut back on # cigs smoked daily.    Objective: BP (!) 164/80   Pulse 67   SpO2 97%  Vitals and nursing notes reviewed  Physical Exam Gen: Alert and Oriented x 3, NAD CV: RRR, 2/6 systolic murmurs, normal S1, S2 split Resp: wheezing bilateral bases of lung fields, no crackles, no rhonchi Ext: no clubbing, cyanosis, or edema Skin: warm, dry, intact, no rashes  Assessment/Plan:  Essential hypertension Poorly controlled at today's visit with patient history stating it is usually "high at home".  - Restart amlodipine 5mg  daily at night time - Sleep study for OSA, could be contributing to HTN as patient states he used CPAP in the past - Advised to quit smoking; patient not ready to quit   PATIENT EDUCATION PROVIDED: See AVS    Diagnosis and plan along with any newly prescribed medication(s) were discussed in detail with this patient today. The patient verbalized understanding and agreed with the plan. Patient advised if symptoms worsen return to clinic or ER.    Orders Placed This Encounter  Procedures  . Ambulatory referral to Sleep Studies    Referral Priority:   Routine    Referral Type:   Consultation    Referral Reason:   Specialty Services Required    Number of  Visits Requested:   1    Meds ordered this encounter  Medications  . amLODipine (NORVASC) 5 MG tablet    Sig: Take 1 tablet (5 mg total) by mouth daily.    Dispense:  90 tablet    Refill:  [place], DO 01/03/2019, 3:08 PM PGY-3 [place]

## 2019-01-03 NOTE — Patient Instructions (Signed)
It was great to meet you today! Thank you for letting me participate in your care!  Today, we discussed your continued high blood pressure. We need to get better control to avoid health complications such as stroke and heart disease. I have started you on amlodipine. Please take it as prescribed.  Follow up with your PCP in 1-2 months.  Be well, Jonathan Rutherford, DO PGY-3, [facility]

## 2019-01-15 DIAGNOSIS — H9211 Otorrhea, right ear: Secondary | ICD-10-CM | POA: Diagnosis not present

## 2019-01-15 DIAGNOSIS — H9011 Conductive hearing loss, unilateral, right ear, with unrestricted hearing on the contralateral side: Secondary | ICD-10-CM | POA: Diagnosis not present

## 2019-01-15 DIAGNOSIS — H6122 Impacted cerumen, left ear: Secondary | ICD-10-CM | POA: Diagnosis not present

## 2019-01-16 ENCOUNTER — Telehealth: Payer: Self-pay | Admitting: Adult Health

## 2019-01-16 NOTE — Telephone Encounter (Signed)
I called pt, spoke with pt's wife. She wants to know if pt needs to bring his cpap to the visit; it is unclear if pt has ever used the cpap. I advised her that it is best to bring the cpap just in case Dr. Brett Fairy needs to review the settings/data from it. Pt will bring his cpap to the appt tomorrow.

## 2019-01-16 NOTE — Telephone Encounter (Signed)
Patient called and left a voicemail.  He has an appointment tomorrow with Dr. Brett Fairy however there appears to be some questions about CPAP usage.  Please call patient

## 2019-01-17 ENCOUNTER — Telehealth: Payer: Self-pay | Admitting: Neurology

## 2019-01-17 ENCOUNTER — Institutional Professional Consult (permissible substitution): Payer: Self-pay | Admitting: Neurology

## 2019-01-17 NOTE — Telephone Encounter (Signed)
New patient appointment no show 08-03-17 with Dr. Krista Blue and now 01-17-2019 with Dr.Aizik Reh after he had a phone reminder message.   Please do not reschedule with me CD.

## 2019-01-23 ENCOUNTER — Other Ambulatory Visit: Payer: Self-pay

## 2019-01-23 ENCOUNTER — Encounter: Payer: Self-pay | Admitting: Neurology

## 2019-01-23 ENCOUNTER — Ambulatory Visit (INDEPENDENT_AMBULATORY_CARE_PROVIDER_SITE_OTHER): Payer: Medicare Other | Admitting: Neurology

## 2019-01-23 VITALS — BP 173/83 | HR 54 | Temp 98.4°F | Ht 66.0 in | Wt 197.0 lb

## 2019-01-23 DIAGNOSIS — I10 Essential (primary) hypertension: Secondary | ICD-10-CM | POA: Diagnosis not present

## 2019-01-23 DIAGNOSIS — I6330 Cerebral infarction due to thrombosis of unspecified cerebral artery: Secondary | ICD-10-CM | POA: Insufficient documentation

## 2019-01-23 DIAGNOSIS — E11319 Type 2 diabetes mellitus with unspecified diabetic retinopathy without macular edema: Secondary | ICD-10-CM | POA: Diagnosis not present

## 2019-01-23 DIAGNOSIS — G4739 Other sleep apnea: Secondary | ICD-10-CM

## 2019-01-23 DIAGNOSIS — J449 Chronic obstructive pulmonary disease, unspecified: Secondary | ICD-10-CM

## 2019-01-23 DIAGNOSIS — G4731 Primary central sleep apnea: Secondary | ICD-10-CM

## 2019-01-23 DIAGNOSIS — G4733 Obstructive sleep apnea (adult) (pediatric): Secondary | ICD-10-CM | POA: Insufficient documentation

## 2019-01-23 DIAGNOSIS — Z8673 Personal history of transient ischemic attack (TIA), and cerebral infarction without residual deficits: Secondary | ICD-10-CM

## 2019-01-23 DIAGNOSIS — G4719 Other hypersomnia: Secondary | ICD-10-CM | POA: Insufficient documentation

## 2019-01-23 DIAGNOSIS — F17200 Nicotine dependence, unspecified, uncomplicated: Secondary | ICD-10-CM

## 2019-01-23 MED ORDER — BUPROPION HCL ER (SR) 150 MG PO TB12: 150.0000 mg | ORAL_TABLET | Freq: Two times a day (BID) | ORAL | 2 refills | Status: DC
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
## 2019-01-23 NOTE — Patient Instructions (Signed)
Steps to Quit Smoking Smoking tobacco is the leading cause of preventable death. It can affect almost every organ in the body. Smoking puts you and people around you at risk for many serious, long-lasting (chronic) diseases. Quitting smoking can be hard, but it is one of the best things that you can do for your health. It is never too late to quit. How do I get ready to quit? When you decide to quit smoking, make a plan to help you succeed. Before you quit:  Pick a date to quit. Set a date within the next 2 weeks to give you time to prepare.  Write down the reasons why you are quitting. Keep this list in places where you will see it often.  Tell your family, friends, and co-workers that you are quitting. Their support is important.  Talk with your doctor about the choices that may help you quit.  Find out if your health insurance will pay for these treatments.  Know the people, places, things, and activities that make you want to smoke (triggers). Avoid them. What first steps can I take to quit smoking?  Throw away all cigarettes at home, at work, and in your car.  Throw away the things that you use when you smoke, such as ashtrays and lighters.  Clean your car. Make sure to empty the ashtray.  Clean your home, including curtains and carpets. What can I do to help me quit smoking? Talk with your doctor about taking medicines and seeing a counselor at the same time. You are more likely to succeed when you do both.  If you are pregnant or breastfeeding, talk with your doctor about counseling or other ways to quit smoking. Do not take medicine to help you quit smoking unless your doctor tells you to do so. To quit smoking: Quit right away  Quit smoking totally, instead of slowly cutting back on how much you smoke over a period of time.  Go to counseling. You are more likely to quit if you go to counseling sessions regularly. Take medicine You may take medicines to help you quit. Some  medicines need a prescription, and some you can buy over-the-counter. Some medicines may contain a drug called nicotine to replace the nicotine in cigarettes. Medicines may:  Help you to stop having the desire to smoke (cravings).  Help to stop the problems that come when you stop smoking (withdrawal symptoms). Your doctor may ask you to use:  Nicotine patches, gum, or lozenges.  Nicotine inhalers or sprays.  Non-nicotine medicine that is taken by mouth. Find resources Find resources and other ways to help you quit smoking and remain smoke-free after you quit. These resources are most helpful when you use them often. They include:  Online chats with a counselor.  Phone quitlines.  Printed self-help materials.  Support groups or group counseling.  Text messaging programs.  Mobile phone apps. Use apps on your mobile phone or tablet that can help you stick to your quit plan. There are many free apps for mobile phones and tablets as well as websites. Examples include Quit Guide from the CDC and smokefree.gov  What things can I do to make it easier to quit?   Talk to your family and friends. Ask them to support and encourage you.  Call a phone quitline (1-800-QUIT-NOW), reach out to support groups, or work with a counselor.  Ask people who smoke to not smoke around you.  Avoid places that make you want to smoke,   such as: ? Bars. ? Parties. ? Smoke-break areas at work.  Spend time with people who do not smoke.  Lower the stress in your life. Stress can make you want to smoke. Try these things to help your stress: ? Getting regular exercise. ? Doing deep-breathing exercises. ? Doing yoga. ? Meditating. ? Doing a body scan. To do this, close your eyes, focus on one area of your body at a time from head to toe. Notice which parts of your body are tense. Try to relax the muscles in those areas. How will I feel when I quit smoking? Day 1 to 3 weeks Within the first 24 hours,  you may start to have some problems that come from quitting tobacco. These problems are very bad 2-3 days after you quit, but they do not often last for more than 2-3 weeks. You may get these symptoms:  Mood swings.  Feeling restless, nervous, angry, or annoyed.  Trouble concentrating.  Dizziness.  Strong desire for high-sugar foods and nicotine.  Weight gain.  Trouble pooping (constipation).  Feeling like you may vomit (nausea).  Coughing or a sore throat.  Changes in how the medicines that you take for other issues work in your body.  Depression.  Trouble sleeping (insomnia). Week 3 and afterward After the first 2-3 weeks of quitting, you may start to notice more positive results, such as:  Better sense of smell and taste.  Less coughing and sore throat.  Slower heart rate.  Lower blood pressure.  Clearer skin.  Better breathing.  Fewer sick days. Quitting smoking can be hard. Do not give up if you fail the first time. Some people need to try a few times before they succeed. Do your best to stick to your quit plan, and talk with your doctor if you have any questions or concerns. Summary  Smoking tobacco is the leading cause of preventable death. Quitting smoking can be hard, but it is one of the best things that you can do for your health.  When you decide to quit smoking, make a plan to help you succeed.  Quit smoking right away, not slowly over a period of time.  When you start quitting, seek help from your doctor, family, or friends. This information is not intended to replace advice given to you by your health care provider. Make sure you discuss any questions you have with your health care provider. Document Released: 02/05/2009 Document Revised: 06/29/2018 Document Reviewed: 06/30/2018 Elsevier Patient Education  2020 Elsevier Inc.  

## 2019-01-23 NOTE — Addendum Note (Signed)
Addended by: Larey Seat on: 01/23/2019 09:48 AM   Modules accepted: Orders

## 2019-01-24 DIAGNOSIS — H2513 Age-related nuclear cataract, bilateral: Secondary | ICD-10-CM | POA: Diagnosis not present

## 2019-01-24 DIAGNOSIS — E119 Type 2 diabetes mellitus without complications: Secondary | ICD-10-CM | POA: Diagnosis not present

## 2019-01-24 DIAGNOSIS — Z7984 Long term (current) use of oral hypoglycemic drugs: Secondary | ICD-10-CM | POA: Diagnosis not present

## 2019-01-24 LAB — HM DIABETES EYE EXAM

## 2019-02-01 DIAGNOSIS — H9211 Otorrhea, right ear: Secondary | ICD-10-CM | POA: Diagnosis not present

## 2019-02-01 DIAGNOSIS — H9011 Conductive hearing loss, unilateral, right ear, with unrestricted hearing on the contralateral side: Secondary | ICD-10-CM | POA: Diagnosis not present

## 2019-02-06 ENCOUNTER — Other Ambulatory Visit: Payer: Self-pay | Admitting: Otolaryngology

## 2019-02-06 DIAGNOSIS — H9211 Otorrhea, right ear: Secondary | ICD-10-CM

## 2019-02-06 DIAGNOSIS — H9011 Conductive hearing loss, unilateral, right ear, with unrestricted hearing on the contralateral side: Secondary | ICD-10-CM

## 2019-02-13 DIAGNOSIS — H2511 Age-related nuclear cataract, right eye: Secondary | ICD-10-CM | POA: Diagnosis not present

## 2019-02-13 DIAGNOSIS — H2512 Age-related nuclear cataract, left eye: Secondary | ICD-10-CM | POA: Diagnosis not present

## 2019-02-15 ENCOUNTER — Ambulatory Visit
Admission: RE | Admit: 2019-02-15 | Discharge: 2019-02-15 | Disposition: A | Discharge status: Home / Self Care | Payer: Medicare Other | Source: Ambulatory Visit | Attending: Otolaryngology | Admitting: Otolaryngology

## 2019-02-15 DIAGNOSIS — H65192 Other acute nonsuppurative otitis media, left ear: Secondary | ICD-10-CM | POA: Diagnosis not present

## 2019-02-15 DIAGNOSIS — H9211 Otorrhea, right ear: Secondary | ICD-10-CM

## 2019-02-15 DIAGNOSIS — H9011 Conductive hearing loss, unilateral, right ear, with unrestricted hearing on the contralateral side: Secondary | ICD-10-CM

## 2019-02-15 DIAGNOSIS — H95191 Other disorders following mastoidectomy, right ear: Secondary | ICD-10-CM | POA: Diagnosis not present

## 2019-02-18 ENCOUNTER — Encounter: Payer: Self-pay | Admitting: Family Medicine

## 2019-02-20 ENCOUNTER — Encounter: Payer: Self-pay | Admitting: Family Medicine

## 2019-02-20 DIAGNOSIS — D23121 Other benign neoplasm of skin of left upper eyelid, including canthus: Secondary | ICD-10-CM | POA: Diagnosis not present

## 2019-02-20 DIAGNOSIS — H2512 Age-related nuclear cataract, left eye: Secondary | ICD-10-CM | POA: Diagnosis not present

## 2019-02-25 ENCOUNTER — Encounter (INDEPENDENT_AMBULATORY_CARE_PROVIDER_SITE_OTHER): Payer: Medicare Other | Admitting: Ophthalmology

## 2019-02-26 NOTE — Progress Notes (Signed)
[facility] Note  02/27/2019     CHIEF COMPLAINT Patient presents for Retina Evaluation   HISTORY OF PRESENT ILLNESS: Jonathan [name] is a 68 y.o. male who presents to the clinic today for:   HPI    Retina Evaluation    In right eye.  This started 2 weeks ago.  Duration of 2 weeks.  Associated Symptoms Negative for Flashes, Blind Spot, Photophobia, Scalp Tenderness, Fever, Floaters, Pain, Glare, Jaw Claudication, Weight Loss, Distortion, Redness, Trauma, Shoulder/Hip pain and Fatigue.  Context:  distance vision, mid-range vision and near vision.  Treatments tried include no treatments.  I, the attending physician,  performed the HPI with the patient and updated documentation appropriately.          Comments    Patient states had CE with IOL OD about 2 weeks ago, and IOL shifted OD. Vision blurred OD. Also had cataract surgery recently in OS, but vision good OS. BS was 115 this am. Last a1c was 7.2 a year ago. Using ketorolac and ocuflox tid OD and PF qid OD.       Last edited by [name], MD on 02/27/2019  3:05 PM. (History)    pt recently had cataract sx OU with Dr. [name], he states the right eye was done first (October 21st), pt states the vision in that eye was blurry and light sensitive immediately after sx, pt states when he went back for a follow up on October 28th, he was told the implant had shifted, pt states Dr. [name] did not mention the right eye sx being difficult and he was pleased with the outcome initially, pt states the left eye had no complications and his vision was immediately better afterwards, pt states he is using Ofloxacin OU TID, Pred OD QID, OS TID, pt had a stroke in January 2019, he states it only affected his speech at the time   Referring physician: [name], MD [place], [place] [address]   HISTORICAL INFORMATION:   Selected notes from the MEDICAL RECORD NUMBER Referred by Dr. [name] for evaluation of  dislocated IOL   LEE: 10.01.20, BCVA OD: 20/70-1 OS: 20/50-2 PMH: DM,HTN, stroke; last a1c was 7.2 on 10.03.19   CURRENT MEDICATIONS: Current Outpatient Medications (Ophthalmic Drugs)  Medication Sig  . ketorolac (ACULAR) 0.5 % ophthalmic solution Place 1 drop into the right eye 3 (three) times daily.  Marland Kitchen ofloxacin (OCUFLOX) 0.3 % ophthalmic solution Place 1 drop into the right eye 3 (three) times daily.  . prednisoLONE acetate (PRED FORTE) 1 % ophthalmic suspension Place 1 drop into the right eye 6 (six) times daily.   No current facility-administered medications for this visit.  (Ophthalmic Drugs)   Current Outpatient Medications (Other)  Medication Sig  . amLODipine (NORVASC) 5 MG tablet Take 1 tablet (5 mg total) by mouth daily.  Marland Kitchen atorvastatin (LIPITOR) 40 MG tablet Take 1 tablet (40 mg total) by mouth daily.  . Blood Glucose Monitoring Suppl (ONETOUCH VERIO) w/Device KIT Check blood sugar once per day  . buPROPion (WELLBUTRIN SR) 150 MG 12 hr tablet Take 1 tablet (150 mg total) by mouth 2 (two) times daily.  . clopidogrel (PLAVIX) 75 MG tablet Take 1 tablet (75 mg total) by mouth daily.  . fexofenadine (ALLEGRA ALLERGY) 180 MG tablet Take 1 tablet (180 mg total) by mouth daily.  . fluticasone (FLONASE) 50 MCG/ACT nasal spray Place 1 spray into both nostrils daily.  Marland Kitchen glucose  blood (ONETOUCH VERIO) test strip Check sugar once per day  . Lancet Devices (ONE TOUCH DELICA LANCING DEV) MISC Check sugar once per day  . lisinopril (PRINIVIL,ZESTRIL) 20 MG tablet Take 1 tablet (20 mg total) by mouth daily.  . metFORMIN (GLUCOPHAGE) 500 MG tablet Take 1 tablet (500 mg total) by mouth 2 (two) times daily with a meal.  . OneTouch Delica Lancets 16X MISC Check sugar once per day  . VENTOLIN HFA 108 (90 Base) MCG/ACT inhaler INHALE 2 PUFFS BY MOUTH EVERY 4 HOURS AS NEEDED FOR WHEEZING OR SHORTNESS OF BREATH.   No current facility-administered medications for this visit.  (Other)       REVIEW OF SYSTEMS: ROS    Positive for: Endocrine, Cardiovascular, Eyes   Negative for: Constitutional, Gastrointestinal, Neurological, Skin, Genitourinary, Musculoskeletal, HENT, Respiratory, Psychiatric, Allergic/Imm, Heme/Lymph   Last edited by Roselee Nova D, COT on 02/27/2019  2:07 PM. (History)       ALLERGIES No Known Allergies  PAST MEDICAL HISTORY Past Medical History:  Diagnosis Date  . Cataract   . COPD (chronic obstructive pulmonary disease) ([place])   . Depression   . Diabetes mellitus without complication ([place])   . HTN (hypertension)   . Hyperlipidemia   . Sleep apnea   . Stroke [facility])    Past Surgical History:  Procedure Laterality Date  . CATARACT EXTRACTION Bilateral    Dr. [name]  . CIRCUMCISION    . none      FAMILY HISTORY Family History  Problem Relation Age of Onset  . Cancer Mother        brain tumor  . Heart disease Father   . Cirrhosis Father     SOCIAL HISTORY Social History   Tobacco Use  . Smoking status: Current Every Day Smoker    Packs/day: 1.00    Years: 43.00    Pack years: 43.00    Types: Cigarettes  . Smokeless tobacco: Never Used  . Tobacco comment: pt is trying to aggressively cut back on # cigs smoked daily.   Substance Use Topics  . Alcohol use: No  . Drug use: No         OPHTHALMIC EXAM:  Base Eye Exam    Visual Acuity (Snellen - Linear)      Right Left   Dist [place] CF at 3' 20/25 -2   Dist ph [place] 20/70 -1 20/20 -1       Tonometry (Tonopen, 2:25 PM)      Right Left   Pressure 19 19       Pupils      Dark Light Shape React APD   Right 4 3 Round Slow None   Left 4 3 Round Slow None       Visual Fields (Counting fingers)      Left Right    Full Full       Extraocular Movement      Right Left    Full, Ortho Full, Ortho       Neuro/Psych    Oriented x3: Yes   Mood/Affect: Normal       Dilation    Both eyes: 1.0% Mydriacyl, 2.5% Phenylephrine @ 2:25 PM        Slit Lamp and Fundus  Exam    Slit Lamp Exam      Right Left   Lids/Lashes Dermatochalasis - upper lid, multiple papillomas, Telangiectasia, mild Meibomian gland dysfunction Dermatochalasis - upper lid, multiple papillomas, Telangiectasia, mild Meibomian gland dysfunction  Conjunctiva/Sclera White and quiet Pinguecula, trace temporal Injection   Cornea Arcus, well healed temporal cataract wounds, trace Punctate epithelial erosions, 1-2+ Descemet's folds, focal corneal scar 0900 midzone Arcus, Debris in tear film, trace Punctate epithelial erosions   Anterior Chamber Deep, 1-2+ Cell / pigment Deep and quiet   Iris Round and poorly dilated Round and dilated   Lens Posterior chamber intraocular lens displaced inferiorly Posterior chamber intraocular lens in good position   Vitreous Vitreous syneresis Vitreous syneresis       Fundus Exam      Right Left   Disc Pink and Sharp Pink and Sharp, mild temporal PPP   C/D Ratio 0.4 0.4   Macula Flat, Blunted foveal reflex, No heme or edema Flat, Blunted foveal reflex, No heme or edema   Vessels Vascular attenuation, very Tortuous, AV crossing changes Vascular attenuation, very Tortuous, AV crossing changes   Periphery Attached, limited view due to poor dilation Attached           Refraction    Manifest Refraction      Dist VA   Right 20/50   Left 20/20-2          IMAGING AND PROCEDURES  Imaging and Procedures for _0 @  OCT, Retina - OU - Both Eyes       Right Eye Quality was borderline. Central Foveal Thickness: 326. Progression has no prior data. Findings include normal foveal contour, no IRF, no SRF, epiretinal membrane.   Left Eye Quality was good. Central Foveal Thickness: 298. Progression has no prior data. Findings include normal foveal contour, no IRF, no SRF, epiretinal membrane, macular pucker.   Notes *Images captured and stored on drive  Diagnosis / Impression:  NFP, no IRF/SRF OU ERM with pucker OS  Clinical management:  See  below  Abbreviations: NFP - Normal foveal profile. CME - cystoid macular edema. PED - pigment epithelial detachment. IRF - intraretinal fluid. SRF - subretinal fluid. EZ - ellipsoid zone. ERM - epiretinal membrane. ORA - outer retinal atrophy. ORT - outer retinal tubulation. SRHM - subretinal hyper-reflective material                 ASSESSMENT/PLAN:    ICD-10-CM   1. Dislocation of intraocular lens, initial encounter  T85.22XA   2. Retinal edema  H35.81 OCT, Retina - OU - Both Eyes  3. Essential hypertension  I10   4. Hypertensive retinopathy of both eyes  H35.033   5. Diabetes mellitus type 2 without retinopathy ([place])  E11.9   6. Pseudophakia of both eyes  Z96.1     1,2. Dislocated IOL OD  - s/p CE/IOL OD on 10.21.20 w/ Dr. [name]  - IOL displaced inferiorly   - poor dilation -- ?pseudoexfoliation, floppy iris OD  - IOP okay and mild persistent inflammation  - will need PPV with IOL exchange OD -- IOL explantation + sutured [place] IOL, iris hooks  - IOL master measurements obtained today 11.5.2020  - recommend increasing PF to 6x/day OD to decrease post-cataract inflammation  - f/u 2 weeks -- will recheck and likely begin surgical planning/scheduling  3,4. Hypertensive retinopathy OU  - discussed importance of tight BP control  - monitor  5. Diabetes mellitus, type 2 without retinopathy  - The incidence, risk factors for progression, natural history and treatment options for diabetic retinopathy  were discussed with patient.    - The need for close monitoring of blood glucose, blood pressure, and serum lipids, avoiding cigarette or any type of  tobacco, and the need for long term follow up was also discussed with patient.  - f/u in 1 year, sooner prn  6. Pseudophakia OU  - s/p CE/IOL OU (Dr. [name], October 2020)  - subluxed IOL OD as above  - OS doing well  - post op drops per Dr. [name]  - monitor    Ophthalmic Meds Ordered this visit:  Meds ordered this  encounter  Medications  . prednisoLONE acetate (PRED FORTE) 1 % ophthalmic suspension    Sig: Place 1 drop into the right eye 6 (six) times daily.    Dispense:  15 mL    Refill:  0       Return in about 2 weeks (around 03/13/2019) for f/u dislocated PC IOL OD, DFE, OCT.  There are no Patient Instructions on file for this visit.   Explained the diagnoses, plan, and follow up with the patient and they expressed understanding.  Patient expressed understanding of the importance of proper follow up care.   This document serves as a record of services personally performed by Gardiner Sleeper, MD, PhD. It was created on their behalf by Roselee Nova, COMT. The creation of this record is the provider's dictation and/or activities during the visit.  Electronically signed by: Roselee Nova, COMT 02/28/19 12:40 PM   Gardiner Sleeper, M.D., Ph.D. Diseases & Surgery of the Retina and Vitreous [facility]  I have reviewed the above documentation for accuracy and completeness, and I agree with the above. Gardiner Sleeper, M.D., Ph.D. 02/28/19 12:40 PM   Abbreviations: M myopia (nearsighted); A astigmatism; H hyperopia (farsighted); P presbyopia; Mrx spectacle prescription;  CTL contact lenses; OD right eye; OS left eye; OU both eyes  XT exotropia; ET esotropia; PEK punctate epithelial keratitis; PEE punctate epithelial erosions; DES dry eye syndrome; MGD meibomian gland dysfunction; ATs artificial tears; PFAT's preservative free artificial tears; [place] nuclear sclerotic cataract; PSC posterior subcapsular cataract; ERM epi-retinal membrane; PVD posterior vitreous detachment; RD retinal detachment; DM diabetes mellitus; DR diabetic retinopathy; NPDR non-proliferative diabetic retinopathy; PDR proliferative diabetic retinopathy; CSME clinically significant macular edema; DME diabetic macular edema; dbh dot blot hemorrhages; CWS cotton wool spot; POAG primary open angle glaucoma; C/D  cup-to-disc ratio; HVF humphrey visual field; GVF goldmann visual field; OCT optical coherence tomography; IOP intraocular pressure; BRVO Branch retinal vein occlusion; CRVO central retinal vein occlusion; CRAO central retinal artery occlusion; BRAO branch retinal artery occlusion; RT retinal tear; SB scleral buckle; PPV pars plana vitrectomy; VH Vitreous hemorrhage; PRP panretinal laser photocoagulation; IVK intravitreal kenalog; VMT vitreomacular traction; MH Macular hole;  NVD neovascularization of the disc; NVE neovascularization elsewhere; AREDS age related eye disease study; ARMD age related macular degeneration; POAG primary open angle glaucoma; EBMD epithelial/anterior basement membrane dystrophy; ACIOL anterior chamber intraocular lens; IOL intraocular lens; PCIOL posterior chamber intraocular lens; Phaco/IOL phacoemulsification with intraocular lens placement; [place] photorefractive keratectomy; LASIK laser assisted in situ keratomileusis; HTN hypertension; DM diabetes mellitus; COPD chronic obstructive pulmonary disease

## 2019-02-27 ENCOUNTER — Encounter (INDEPENDENT_AMBULATORY_CARE_PROVIDER_SITE_OTHER): Payer: Self-pay | Admitting: Ophthalmology

## 2019-02-27 ENCOUNTER — Other Ambulatory Visit: Payer: Self-pay

## 2019-02-27 ENCOUNTER — Ambulatory Visit (INDEPENDENT_AMBULATORY_CARE_PROVIDER_SITE_OTHER): Payer: Medicare Other | Admitting: Ophthalmology

## 2019-02-27 DIAGNOSIS — I10 Essential (primary) hypertension: Secondary | ICD-10-CM | POA: Diagnosis not present

## 2019-02-27 DIAGNOSIS — H3581 Retinal edema: Secondary | ICD-10-CM | POA: Diagnosis not present

## 2019-02-27 DIAGNOSIS — H35033 Hypertensive retinopathy, bilateral: Secondary | ICD-10-CM

## 2019-02-27 DIAGNOSIS — Z961 Presence of intraocular lens: Secondary | ICD-10-CM

## 2019-02-27 DIAGNOSIS — T8522XA Displacement of intraocular lens, initial encounter: Secondary | ICD-10-CM

## 2019-02-27 DIAGNOSIS — E119 Type 2 diabetes mellitus without complications: Secondary | ICD-10-CM | POA: Diagnosis not present

## 2019-02-27 MED ORDER — PREDNISOLONE ACETATE 1 % OP SUSP: 1.0000 [drp] | Freq: Every day | OPHTHALMIC | 0 refills | Status: DC

## 2019-02-27 NOTE — Patient Outreach (Signed)
[facility]) Care Management  02/27/2019  Jonathan [name] 1950-12-04 [id number]   Medication Adherence call to [name] [facility] Identifiers Verify spoke with patient he is past due on Lisinopril 20 mg patient ask to call [facility] to order this medication but, when calling [facility] they said patient pick up this medication on sept.24th for a 90 days supply,call patient back he explain he did not have a change to look at his pill bottles but he has medication for another month and half. [name] is showing past due under [place].   [facility] Management Direct Dial [phone]  Fax [phone] [email or web address]

## 2019-03-12 NOTE — Progress Notes (Signed)
[facility] Note  03/13/2019     CHIEF COMPLAINT Patient presents for Retina Follow Up   HISTORY OF PRESENT ILLNESS: Jonathan [name] is a 68 y.o. male who presents to the clinic today for:   HPI    Retina Follow Up    Patient presents with  Other.  In right eye.  This started weeks ago.  Severity is moderate.  I, the attending physician,  performed the HPI with the patient and updated documentation appropriately.          Comments    Patient states his vision in his right eye has gotten worse over the last week--states it looks like a black blob over his vision OD.  He denies eye pain or discomfort and denies any new or worsening floaters or fol OU.       Last edited by [name], MD on 03/14/2019  1:47 AM. (History)    pt states he noticed a decrease in vision last week (Wednesday), he states it looks like a "black blob" in his vision, he states it came into his center vision yesterday  Referring physician: [name], MD [place], [place] [address]   HISTORICAL INFORMATION:   Selected notes from the MEDICAL RECORD NUMBER Referred by Dr. [name] for evaluation of dislocated IOL   LEE: 10.01.20, BCVA OD: 20/70-1 OS: 20/50-2 PMH: DM,HTN, stroke; last a1c was 7.2 on 10.03.19   CURRENT MEDICATIONS: Current Outpatient Medications (Ophthalmic Drugs)  Medication Sig  . prednisoLONE acetate (PRED FORTE) 1 % ophthalmic suspension Place 1 drop into the right eye 6 (six) times daily.   No current facility-administered medications for this visit.  (Ophthalmic Drugs)   Current Outpatient Medications (Other)  Medication Sig  . amLODipine (NORVASC) 5 MG tablet Take 1 tablet (5 mg total) by mouth daily.  Marland Kitchen atorvastatin (LIPITOR) 40 MG tablet Take 1 tablet (40 mg total) by mouth daily.  . Blood Glucose Monitoring Suppl (ONETOUCH VERIO) w/Device KIT Check blood sugar once per day  . buPROPion (WELLBUTRIN SR) 150 MG 12 hr tablet Take 1  tablet (150 mg total) by mouth 2 (two) times daily.  . clopidogrel (PLAVIX) 75 MG tablet Take 1 tablet (75 mg total) by mouth daily.  . fexofenadine (ALLEGRA ALLERGY) 180 MG tablet Take 1 tablet (180 mg total) by mouth daily.  . fluticasone (FLONASE) 50 MCG/ACT nasal spray Place 1 spray into both nostrils daily.  Marland Kitchen glucose blood (ONETOUCH VERIO) test strip Check sugar once per day  . Lancet Devices (ONE TOUCH DELICA LANCING DEV) MISC Check sugar once per day  . lisinopril (PRINIVIL,ZESTRIL) 20 MG tablet Take 1 tablet (20 mg total) by mouth daily.  . metFORMIN (GLUCOPHAGE) 500 MG tablet Take 1 tablet (500 mg total) by mouth 2 (two) times daily with a meal.  . OneTouch Delica Lancets 81O MISC Check sugar once per day  . VENTOLIN HFA 108 (90 Base) MCG/ACT inhaler INHALE 2 PUFFS BY MOUTH EVERY 4 HOURS AS NEEDED FOR WHEEZING OR SHORTNESS OF BREATH. (Patient taking differently: Inhale 2 puffs into the lungs every 4 (four) hours as needed for wheezing or shortness of breath. )   No current facility-administered medications for this visit.  (Other)   Facility-Administered Medications Ordered in Other Visits (Other)  Medication Route  . 0.9 %  sodium chloride infusion Intravenous  . fentaNYL (SUBLIMAZE) 100 MCG/2ML injection   . fentaNYL (SUBLIMAZE) injection 25-50 mcg Intravenous  . labetalol (  NORMODYNE) 5 MG/ML injection   . labetalol (NORMODYNE) injection 5 mg Intravenous  . ondansetron (ZOFRAN) injection 4 mg Intravenous  . oxyCODONE (Oxy IR/ROXICODONE) immediate release tablet 5 mg Oral   Or  . oxyCODONE (ROXICODONE) 5 MG/5ML solution 5 mg Oral      REVIEW OF SYSTEMS: ROS    Positive for: Endocrine, Cardiovascular, Eyes   Negative for: Constitutional, Gastrointestinal, Neurological, Skin, Genitourinary, Musculoskeletal, HENT, Respiratory, Psychiatric, Allergic/Imm, Heme/Lymph   Last edited by Doneen Poisson on 03/13/2019  1:31 PM. (History)       ALLERGIES No Known  Allergies  PAST MEDICAL HISTORY Past Medical History:  Diagnosis Date  . Cataract   . COPD (chronic obstructive pulmonary disease) ([place])   . Depression   . Diabetes mellitus without complication ([place])   . HTN (hypertension)   . Hyperlipidemia   . Sleep apnea   . Stroke [facility])    Past Surgical History:  Procedure Laterality Date  . CATARACT EXTRACTION Bilateral    Dr. [name]  . CIRCUMCISION    . none      FAMILY HISTORY Family History  Problem Relation Age of Onset  . Cancer Mother        brain tumor  . Heart disease Father   . Cirrhosis Father     SOCIAL HISTORY Social History   Tobacco Use  . Smoking status: Current Every Day Smoker    Packs/day: 1.00    Years: 43.00    Pack years: 43.00    Types: Cigarettes  . Smokeless tobacco: Never Used  . Tobacco comment: pt is trying to aggressively cut back on # cigs smoked daily.   Substance Use Topics  . Alcohol use: No  . Drug use: No         OPHTHALMIC EXAM:  Base Eye Exam    Visual Acuity (Snellen - Linear)      Right Left   Dist [place] HM 20/20 -2   Dist ph [place] NI        Tonometry (Tonopen, 1:32 PM)      Right Left   Pressure 18 14       Pupils      Dark Light Shape React APD   Right 6 5 Round Minimal 0   Left 6 5 Round Minimal 0       Visual Fields      Left Right    Full    Restrictions  Total superior temporal, inferior temporal, superior nasal, inferior nasal deficiencies       Extraocular Movement      Right Left    Full Full       Neuro/Psych    Oriented x3: Yes   Mood/Affect: Normal       Dilation    Both eyes: 1.0% Mydriacyl, 2.5% Phenylephrine @ 1:33 PM        Slit Lamp and Fundus Exam    Slit Lamp Exam      Right Left   Lids/Lashes Dermatochalasis - upper lid, multiple papillomas, Telangiectasia, mild Meibomian gland dysfunction Dermatochalasis - upper lid, multiple papillomas, Telangiectasia, mild Meibomian gland dysfunction   Conjunctiva/Sclera White and quiet  Pinguecula, trace temporal Injection   Cornea Arcus, well healed temporal cataract wounds, trace Punctate epithelial erosions, mild endopigment, 1-2+ Descemet's folds, focal corneal scar 0900 midzone Arcus, Debris in tear film, trace Punctate epithelial erosions   Anterior Chamber Deep and quiet Deep and quiet   Iris Round and moderately dilated Round and dilated  Lens Posterior chamber intraocular lens displaced inferiorly; open capsule, vitreous prolapse Posterior chamber intraocular lens in good position   Vitreous Vitreous syneresis, +pigment Vitreous syneresis       Fundus Exam      Right Left   Disc Pink and Sharp Pink and Sharp, mild temporal PPP   C/D Ratio 0.4 0.4   Macula Flat, Blunted foveal reflex, No heme or edema Flat, Blunted foveal reflex, No heme or edema   Vessels Vascular attenuation, very Tortuous, AV crossing changes Vascular attenuation, very Tortuous, AV crossing changes   Periphery Bullous superior detachment from 1000-130 with multiple tears at 1030 and 1100 Attached, paving stone degeneration inferiorly, retinal hole at 0100, flap tear at 0730          IMAGING AND PROCEDURES  Imaging and Procedures for <[id number]>_6<\/[id number]>_0 @  Basic metabolic panel     Component Value Flag Ref Range Units Status   Sodium 140      135 - 145 mmol/L Final   Potassium 4.5      3.5 - 5.1 mmol/L Final   Chloride 105      98 - 111 mmol/L Final   CO2 24      22 - 32 mmol/L Final   Glucose, Bld 97      70 - 99 mg/dL Final   BUN 14      8 - 23 mg/dL Final   Creatinine, Ser 1.24      0.61 - 1.24 mg/dL Final   Calcium 9.1      8.9 - 10.3 mg/dL Final   GFR calc non Af Amer 60      >60 mL/min Final   GFR calc Af Amer >60      >60 mL/min Final   Anion gap 11      5 - 15  Final   Comment:   Performed at [facility], [address] [place], [place] [address]        CBC     Component Value Flag Ref Range Units Status   WBC 6.9      4.0 - 10.5 K/uL Final   RBC 5.15      4.22 - 5.81 MIL/uL  Final   Hemoglobin 15.5      13.0 - 17.0 g/dL Final   HCT 47.8      39.0 - 52.0 % Final   MCV 92.8      80.0 - 100.0 fL Final   MCH 30.1      26.0 - 34.0 pg Final   MCHC 32.4      30.0 - 36.0 g/dL Final   RDW 13.8      11.5 - 15.5 % Final   Platelets 265      150 - 400 K/uL Final   nRBC 0.0      0.0 - 0.2 % Final   Comment:   Performed at [facility], [place] [address] [place], [place] [address]        Glucose, capillary     Component Value Flag Ref Range Units Status   Glucose-Capillary 93      70 - 99 mg/dL Final        SARS Coronavirus 2 by RT PCR (hospital order, performed in [facility] hospital lab) Nasopharyngeal Nasopharyngeal Swab     Specimen Information: Nasopharyngeal Swab        Component Value Flag Ref Range Units Status   SARS Coronavirus 2 NEGATIVE  NEGATIVE  Final   Comment:   (NOTE) If result is NEGATIVE SARS-CoV-2 target nucleic acids are NOT DETECTED. The SARS-CoV-2 RNA is generally detectable in upper and lower  respiratory specimens during the acute phase of infection. The lowest  concentration of SARS-CoV-2 viral copies this assay can detect is 250  copies / mL. A negative result does not preclude SARS-CoV-2 infection  and should not be used as the sole basis for treatment or other  patient management decisions.  A negative result may occur with  improper specimen collection / handling, submission of specimen other  than nasopharyngeal swab, presence of viral mutation(s) within the  areas targeted by this assay, and inadequate number of viral copies  (<250 copies / mL). A negative result must be combined with clinical  observations, patient history, and epidemiological information. If result is POSITIVE SARS-CoV-2 target nucleic acids are DETECTED. The SARS-CoV-2 RNA is generally detectable in upper and lower  respiratory specimens during the acute phase of infection.  Positive  results are indicative of active infection with SARS-CoV-2.   Clinical  correlation with patient history and other diagnostic information is  necessary to determine patient infection status.  Positive results do  not rule out bacterial infection or co-infection with other viruses. If result is PRESUMPTIVE POSTIVE SARS-CoV-2 nucleic acids MAY BE PRESENT.   A presumptive positive result was obtained on the submitted specimen  and confirmed on repeat testing.  While 2019 novel coronavirus  (SARS-CoV-2) nucleic acids may be present in the submitted sample  additional confirmatory testing may be necessary for epidemiological  and / or clinical management purposes  to differentiate between  SARS-CoV-2 and other Sarbecovirus currently known to infect humans.  If clinically indicated additional testing with an alternate test  methodology [id number]) is advised. The SARS-CoV-2 RNA is generally  detectable in upper and lower respiratory specimens during the acute  phase of infection. The expected result is Negative. Fact Sheet for Patients:  StrictlyIdeas.no Fact Sheet for Healthcare Providers: BankingDealers.co.za This test is not yet approved or cleared by the [place] FDA and has been authorized for detection and/or diagnosis of SARS-CoV-2 by FDA under an Emergency Use Authorization (EUA).  This EUA will remain in effect (meaning this test can be used) for the duration of the COVID-19 declaration under Section 564(b)(1) of the Act, 21 U.S.C. section 360bbb-3(b)(1), unless the authorization is terminated or revoked sooner. Performed at [facility], [place] [address], [place], [place] [address]         Glucose, capillary     Component Value Flag Ref Range Units Status   Glucose-Capillary 135      70 - 99 mg/dL Final        OCT, Retina - OU - Both Eyes       Right Eye Quality was borderline. Central Foveal Thickness: 685. Progression has worsened. Findings include abnormal foveal contour,  intraretinal fluid, subretinal fluid.   Left Eye Quality was good. Central Foveal Thickness: 309. Progression has been stable. Findings include normal foveal contour, no IRF, no SRF, epiretinal membrane, macular pucker.   Notes *Images captured and stored on drive  Diagnosis / Impression:  OD: superior retinal detachment with fovea and macular involvement OS: NFP, no IRF/SRF  Clinical management:  See below  Abbreviations: NFP - Normal foveal profile. CME - cystoid macular edema. PED - pigment epithelial detachment. IRF - intraretinal fluid. SRF - subretinal fluid. EZ - ellipsoid zone. ERM - epiretinal membrane. ORA - outer  retinal atrophy. ORT - outer retinal tubulation. SRHM - subretinal hyper-reflective material                 ASSESSMENT/PLAN:    ICD-10-CM   1. Right retinal detachment  H33.21   2. Retinal edema  H35.81 OCT, Retina - OU - Both Eyes  3. Dislocation of intraocular lens, initial encounter  T85.22XA   4. Retinal breaks without detachment  H35.89   5. Essential hypertension  I10   6. Hypertensive retinopathy of both eyes  H35.033   7. Diabetes mellitus type 2 without retinopathy ([place])  E11.9   8. Pseudophakia of both eyes  Z96.1     1,2. Rhegmatogenous retinal detachment, right eye  - bullous superior mac off detachment, onset ~1 wk, with onset of foveal involvement Tuesday, 03/12/19 by pt history  - detached from 1000 to 0130, fovea off, multiple tears at 1030 and 1100  - The incidence, risk factors, and natural history of retinal detachment was discussed with patient.  Potential treatment options including delimiting laser, pneumatic retinopexy, scleral buckle, and vitrectomy, cryotherapy and laser, and the use of air, gas, and oil discussed with patient.  The risks of blindness, loss of vision, infection, hemorrhage, cataract progression or lens displacement were discussed with patient.  - recommend urgent 25g PPV/EL/Gas OD under general anesthesia  - pt  wishes to proceed with surgery  - RBA of procedure discussed, questions answered  - informed consent obtained and signed  - case scheduled for this afternoon at 5 pm -- [facility] OR 08  - f/u POD1  3. Dislocated IOL OD  - s/p CE/IOL OD on 10.21.20 w/ Dr. [name]  - IOL displaced inferiorly with superior haptic hung up in capsular remnant   - poor dilation -- ?pseudoexfoliation, floppy iris OD  - now with macula/fovea involving RD as above  - recommend IOL explantation during RD repair today  - will leave aphakic while RD heals  - will plan for sutured [place] IOL once RD resolved and healed  - IOL master measurements obtained 11.5.2020  4. Retinal breaks without detachment OS  - scleral depressed exam of OS completed as part of RD work up  - large retinal break at 12, smaller flap tears at 0430 amd 0730  - recommend indirect laser retinopexy OS during surgery for OD above  5,6. Hypertensive retinopathy OU  - discussed importance of tight BP control  - monitor  7. Diabetes mellitus, type 2 without retinopathy  - The incidence, risk factors for progression, natural history and treatment options for diabetic retinopathy  were discussed with patient.    - The need for close monitoring of blood glucose, blood pressure, and serum lipids, avoiding cigarette or any type of tobacco, and the need for long term follow up was also discussed with patient.  - f/u in 1 year, sooner prn  8. Pseudophakia OU  - s/p CE/IOL OU (Dr. [name], October 2020)  - subluxed IOL OD as above - will explant today in OR  - OS doing well  - post op drops per Dr. [name]  - monitor    Ophthalmic Meds Ordered this visit:  No orders of the defined types were placed in this encounter.      Return in about 1 day (around 03/14/2019) for POV.  There are no Patient Instructions on file for this visit.   Explained the diagnoses, plan, and follow up with the patient and they expressed understanding.  Patient  expressed understanding  of the importance of proper follow up care.   This document serves as a record of services personally performed by Gardiner Sleeper, MD, PhD. It was created on their behalf by Roselee Nova, COMT. The creation of this record is the provider's dictation and/or activities during the visit.  Electronically signed by: Roselee Nova, COMT 03/14/19 2:03 AM  Gardiner Sleeper, M.D., Ph.D. Diseases & Surgery of the Retina and [place] 03/13/2019   I have reviewed the above documentation for accuracy and completeness, and I agree with the above. Gardiner Sleeper, M.D., Ph.D. 03/14/19 2:03 AM     Abbreviations: M myopia (nearsighted); A astigmatism; H hyperopia (farsighted); P presbyopia; Mrx spectacle prescription;  CTL contact lenses; OD right eye; OS left eye; OU both eyes  XT exotropia; ET esotropia; PEK punctate epithelial keratitis; PEE punctate epithelial erosions; DES dry eye syndrome; MGD meibomian gland dysfunction; ATs artificial tears; PFAT's preservative free artificial tears; [place] nuclear sclerotic cataract; PSC posterior subcapsular cataract; ERM epi-retinal membrane; PVD posterior vitreous detachment; RD retinal detachment; DM diabetes mellitus; DR diabetic retinopathy; NPDR non-proliferative diabetic retinopathy; PDR proliferative diabetic retinopathy; CSME clinically significant macular edema; DME diabetic macular edema; dbh dot blot hemorrhages; CWS cotton wool spot; POAG primary open angle glaucoma; C/D cup-to-disc ratio; HVF humphrey visual field; GVF goldmann visual field; OCT optical coherence tomography; IOP intraocular pressure; BRVO Branch retinal vein occlusion; CRVO central retinal vein occlusion; CRAO central retinal artery occlusion; BRAO branch retinal artery occlusion; RT retinal tear; SB scleral buckle; PPV pars plana vitrectomy; VH Vitreous hemorrhage; PRP panretinal laser photocoagulation; IVK intravitreal kenalog; VMT  vitreomacular traction; MH Macular hole;  NVD neovascularization of the disc; NVE neovascularization elsewhere; AREDS age related eye disease study; ARMD age related macular degeneration; POAG primary open angle glaucoma; EBMD epithelial/anterior basement membrane dystrophy; ACIOL anterior chamber intraocular lens; IOL intraocular lens; PCIOL posterior chamber intraocular lens; Phaco/IOL phacoemulsification with intraocular lens placement; [place] photorefractive keratectomy; LASIK laser assisted in situ keratomileusis; HTN hypertension; DM diabetes mellitus; COPD chronic obstructive pulmonary disease

## 2019-03-13 ENCOUNTER — Ambulatory Visit (HOSPITAL_COMMUNITY): Payer: Medicare Other | Admitting: Certified Registered Nurse Anesthetist

## 2019-03-13 ENCOUNTER — Encounter (HOSPITAL_COMMUNITY): Payer: Self-pay | Admitting: Certified Registered Nurse Anesthetist

## 2019-03-13 ENCOUNTER — Ambulatory Visit (HOSPITAL_COMMUNITY)
Admission: RE | Admit: 2019-03-13 | Discharge: 2019-03-13 | Disposition: A | Discharge status: Home / Self Care | Payer: Medicare Other | Source: Other Acute Inpatient Hospital | Attending: Ophthalmology | Admitting: Ophthalmology

## 2019-03-13 ENCOUNTER — Other Ambulatory Visit: Payer: Self-pay

## 2019-03-13 ENCOUNTER — Other Ambulatory Visit: Payer: Self-pay | Admitting: Family Medicine

## 2019-03-13 ENCOUNTER — Ambulatory Visit (INDEPENDENT_AMBULATORY_CARE_PROVIDER_SITE_OTHER): Payer: Medicare Other | Admitting: Ophthalmology

## 2019-03-13 ENCOUNTER — Encounter (HOSPITAL_COMMUNITY): Admission: RE | Disposition: A | Discharge status: Home / Self Care | Payer: Self-pay | Attending: Ophthalmology

## 2019-03-13 DIAGNOSIS — F17210 Nicotine dependence, cigarettes, uncomplicated: Secondary | ICD-10-CM | POA: Diagnosis not present

## 2019-03-13 DIAGNOSIS — Z7951 Long term (current) use of inhaled steroids: Secondary | ICD-10-CM | POA: Insufficient documentation

## 2019-03-13 DIAGNOSIS — I10 Essential (primary) hypertension: Secondary | ICD-10-CM

## 2019-03-13 DIAGNOSIS — H3589 Other specified retinal disorders: Secondary | ICD-10-CM

## 2019-03-13 DIAGNOSIS — Z20828 Contact with and (suspected) exposure to other viral communicable diseases: Secondary | ICD-10-CM | POA: Insufficient documentation

## 2019-03-13 DIAGNOSIS — T8522XA Displacement of intraocular lens, initial encounter: Secondary | ICD-10-CM

## 2019-03-13 DIAGNOSIS — H33001 Unspecified retinal detachment with retinal break, right eye: Secondary | ICD-10-CM | POA: Insufficient documentation

## 2019-03-13 DIAGNOSIS — H35033 Hypertensive retinopathy, bilateral: Secondary | ICD-10-CM

## 2019-03-13 DIAGNOSIS — H3321 Serous retinal detachment, right eye: Secondary | ICD-10-CM | POA: Diagnosis not present

## 2019-03-13 DIAGNOSIS — E785 Hyperlipidemia, unspecified: Secondary | ICD-10-CM | POA: Diagnosis not present

## 2019-03-13 DIAGNOSIS — G4730 Sleep apnea, unspecified: Secondary | ICD-10-CM | POA: Diagnosis not present

## 2019-03-13 DIAGNOSIS — E119 Type 2 diabetes mellitus without complications: Secondary | ICD-10-CM

## 2019-03-13 DIAGNOSIS — H3581 Retinal edema: Secondary | ICD-10-CM

## 2019-03-13 DIAGNOSIS — H33302 Unspecified retinal break, left eye: Secondary | ICD-10-CM | POA: Diagnosis not present

## 2019-03-13 DIAGNOSIS — H338 Other retinal detachments: Secondary | ICD-10-CM | POA: Diagnosis not present

## 2019-03-13 DIAGNOSIS — H33002 Unspecified retinal detachment with retinal break, left eye: Secondary | ICD-10-CM | POA: Diagnosis not present

## 2019-03-13 DIAGNOSIS — J449 Chronic obstructive pulmonary disease, unspecified: Secondary | ICD-10-CM | POA: Insufficient documentation

## 2019-03-13 DIAGNOSIS — H44751 Retained (nonmagnetic) (old) foreign body in vitreous body, right eye: Secondary | ICD-10-CM | POA: Diagnosis not present

## 2019-03-13 DIAGNOSIS — F329 Major depressive disorder, single episode, unspecified: Secondary | ICD-10-CM | POA: Insufficient documentation

## 2019-03-13 DIAGNOSIS — Z7984 Long term (current) use of oral hypoglycemic drugs: Secondary | ICD-10-CM | POA: Insufficient documentation

## 2019-03-13 DIAGNOSIS — Z8673 Personal history of transient ischemic attack (TIA), and cerebral infarction without residual deficits: Secondary | ICD-10-CM | POA: Insufficient documentation

## 2019-03-13 DIAGNOSIS — H2710 Unspecified dislocation of lens: Secondary | ICD-10-CM | POA: Diagnosis not present

## 2019-03-13 DIAGNOSIS — Z961 Presence of intraocular lens: Secondary | ICD-10-CM

## 2019-03-13 DIAGNOSIS — E1136 Type 2 diabetes mellitus with diabetic cataract: Secondary | ICD-10-CM | POA: Insufficient documentation

## 2019-03-13 DIAGNOSIS — Z7902 Long term (current) use of antithrombotics/antiplatelets: Secondary | ICD-10-CM | POA: Diagnosis not present

## 2019-03-13 DIAGNOSIS — Z79899 Other long term (current) drug therapy: Secondary | ICD-10-CM | POA: Insufficient documentation

## 2019-03-13 HISTORY — PX: LASER PHOTO ABLATION: SHX5942

## 2019-03-13 HISTORY — PX: PHOTOCOAGULATION WITH LASER: SHX6027

## 2019-03-13 HISTORY — PX: PARS PLANA VITRECTOMY: SHX2166

## 2019-03-13 HISTORY — PX: GAS INSERTION: SHX5336

## 2019-03-13 HISTORY — PX: RETINAL DETACHMENT SURGERY: SHX105

## 2019-03-13 LAB — CBC
HCT: 47.8 % (ref 39.0–52.0)
Hemoglobin: 15.5 g/dL (ref 13.0–17.0)
MCH: 30.1 pg (ref 26.0–34.0)
MCHC: 32.4 g/dL (ref 30.0–36.0)
MCV: 92.8 fL (ref 80.0–100.0)
Platelets: 265 10*3/uL (ref 150–400)
RBC: 5.15 MIL/uL (ref 4.22–5.81)
RDW: 13.8 % (ref 11.5–15.5)
WBC: 6.9 10*3/uL (ref 4.0–10.5)
nRBC: 0 % (ref 0.0–0.2)

## 2019-03-13 LAB — BASIC METABOLIC PANEL
Anion gap: 11 (ref 5–15)
BUN: 14 mg/dL (ref 8–23)
CO2: 24 mmol/L (ref 22–32)
Calcium: 9.1 mg/dL (ref 8.9–10.3)
Chloride: 105 mmol/L (ref 98–111)
Creatinine, Ser: 1.24 mg/dL (ref 0.61–1.24)
GFR calc Af Amer: >60 mL/min (ref >60)
GFR calc non Af Amer: 60 mL/min — ABNORMAL LOW (ref >60)
Glucose, Bld: 97 mg/dL (ref 70–99)
Potassium: 4.5 mmol/L (ref 3.5–5.1)
Sodium: 140 mmol/L (ref 135–145)

## 2019-03-13 LAB — GLUCOSE, CAPILLARY
Glucose-Capillary: 93 mg/dL (ref 70–99)
Glucose-Capillary: 135 mg/dL — ABNORMAL HIGH (ref 70–99)

## 2019-03-13 LAB — SARS CORONAVIRUS 2 BY RT PCR (HOSPITAL ORDER, PERFORMED IN ~~LOC~~ HOSPITAL LAB): SARS Coronavirus 2: NEGATIVE

## 2019-03-13 SURGERY — PARS PLANA VITRECTOMY WITH 25 GAUGE
Anesthesia: General | Site: Eye | Laterality: Right

## 2019-03-13 MED ORDER — NA CHONDROIT SULF-NA HYALURON 40-30 MG/ML IO SOLN
INTRAOCULAR | Status: DC | PRN
  Administered 2019-03-13 (×2): 0.5 mL via INTRAOCULAR

## 2019-03-13 MED ORDER — ATROPINE SULFATE 1 % OP SOLN
1.0000 [drp] | OPHTHALMIC | Status: AC | PRN
  Administered 2019-03-13 (×3): 1 [drp] via OPHTHALMIC
  Filled 2019-03-13: qty 5

## 2019-03-13 MED ORDER — BRIMONIDINE TARTRATE 0.2 % OP SOLN
OPHTHALMIC | Status: AC
  Filled 2019-03-13: qty 5

## 2019-03-13 MED ORDER — GATIFLOXACIN 0.5 % OP SOLN OPTIME - NO CHARGE
OPHTHALMIC | Status: DC | PRN
  Administered 2019-03-13: 1 [drp] via OPHTHALMIC

## 2019-03-13 MED ORDER — PREDNISOLONE ACETATE 1 % OP SUSP
OPHTHALMIC | Status: DC | PRN
  Administered 2019-03-13: 1 [drp] via OPHTHALMIC

## 2019-03-13 MED ORDER — 0.9 % SODIUM CHLORIDE (POUR BTL) OPTIME
TOPICAL | Status: DC | PRN
  Administered 2019-03-13: 19:00:00 200 mL

## 2019-03-13 MED ORDER — DORZOLAMIDE HCL-TIMOLOL MAL 2-0.5 % OP SOLN
OPHTHALMIC | Status: DC | PRN
  Administered 2019-03-13: 1 [drp] via OPHTHALMIC

## 2019-03-13 MED ORDER — ONDANSETRON HCL 4 MG/2ML IJ SOLN
INTRAMUSCULAR | Status: DC | PRN
  Administered 2019-03-13: 4 mg via INTRAVENOUS

## 2019-03-13 MED ORDER — ROCURONIUM BROMIDE 10 MG/ML (PF) SYRINGE
PREFILLED_SYRINGE | INTRAVENOUS | Status: AC
  Filled 2019-03-13: qty 10

## 2019-03-13 MED ORDER — DEXAMETHASONE SODIUM PHOSPHATE 10 MG/ML IJ SOLN
INTRAMUSCULAR | Status: AC
  Filled 2019-03-13: qty 1

## 2019-03-13 MED ORDER — LABETALOL HCL 5 MG/ML IV SOLN
INTRAVENOUS | Status: AC
  Filled 2019-03-13: qty 4

## 2019-03-13 MED ORDER — FENTANYL CITRATE (PF) 250 MCG/5ML IJ SOLN
INTRAMUSCULAR | Status: AC
  Filled 2019-03-13: qty 5

## 2019-03-13 MED ORDER — PHENYLEPHRINE HCL (PRESSORS) 10 MG/ML IV SOLN
INTRAVENOUS | Status: DC | PRN
  Administered 2019-03-13: 100 ug via INTRAVENOUS

## 2019-03-13 MED ORDER — TROPICAMIDE 1 % OP SOLN
1.0000 [drp] | OPHTHALMIC | Status: AC | PRN
  Administered 2019-03-13 (×3): 1 [drp] via OPHTHALMIC
  Filled 2019-03-13: qty 15

## 2019-03-13 MED ORDER — TRIAMCINOLONE ACETONIDE 40 MG/ML IJ SUSP
INTRAMUSCULAR | Status: DC | PRN
  Administered 2019-03-13: 40 mg

## 2019-03-13 MED ORDER — TRIAMCINOLONE ACETONIDE 40 MG/ML IJ SUSP
INTRAMUSCULAR | Status: AC
  Filled 2019-03-13: qty 5

## 2019-03-13 MED ORDER — NA CHONDROIT SULF-NA HYALURON 40-30 MG/ML IO SOLN
INTRAOCULAR | Status: AC
  Filled 2019-03-13: qty 1

## 2019-03-13 MED ORDER — EPHEDRINE SULFATE 50 MG/ML IJ SOLN
INTRAMUSCULAR | Status: DC | PRN
  Administered 2019-03-13: 10 mg via INTRAVENOUS

## 2019-03-13 MED ORDER — SODIUM CHLORIDE 0.9 % IV SOLN
INTRAVENOUS | Status: DC
  Administered 2019-03-13 (×2): via INTRAVENOUS

## 2019-03-13 MED ORDER — ROCURONIUM BROMIDE 100 MG/10ML IV SOLN
INTRAVENOUS | Status: DC | PRN
  Administered 2019-03-13: 50 mg via INTRAVENOUS
  Administered 2019-03-13: 10 mg via INTRAVENOUS
  Administered 2019-03-13: 20 mg via INTRAVENOUS

## 2019-03-13 MED ORDER — LIDOCAINE 2% (20 MG/ML) 5 ML SYRINGE
INTRAMUSCULAR | Status: AC
  Filled 2019-03-13: qty 5

## 2019-03-13 MED ORDER — PROPARACAINE HCL 0.5 % OP SOLN
1.0000 [drp] | OPHTHALMIC | Status: AC | PRN
  Administered 2019-03-13 (×3): 1 [drp] via OPHTHALMIC
  Filled 2019-03-13: qty 15

## 2019-03-13 MED ORDER — BUPIVACAINE HCL (PF) 0.75 % IJ SOLN
INTRAMUSCULAR | Status: AC
  Filled 2019-03-13: qty 10

## 2019-03-13 MED ORDER — EPINEPHRINE PF 1 MG/ML IJ SOLN
INTRAMUSCULAR | Status: AC
  Filled 2019-03-13: qty 1

## 2019-03-13 MED ORDER — BSS IO SOLN
INTRAOCULAR | Status: AC
  Filled 2019-03-13: qty 15

## 2019-03-13 MED ORDER — BSS PLUS IO SOLN
INTRAOCULAR | Status: AC
  Filled 2019-03-13: qty 500

## 2019-03-13 MED ORDER — LABETALOL HCL 5 MG/ML IV SOLN
5.0000 mg | INTRAVENOUS | Status: DC | PRN
  Administered 2019-03-13: 5 mg via INTRAVENOUS

## 2019-03-13 MED ORDER — GATIFLOXACIN 0.5 % OP SOLN
OPHTHALMIC | Status: AC
  Filled 2019-03-13: qty 2.5

## 2019-03-13 MED ORDER — ATROPINE SULFATE 1 % OP SOLN
OPHTHALMIC | Status: AC
  Filled 2019-03-13: qty 5

## 2019-03-13 MED ORDER — ESMOLOL HCL 100 MG/10ML IV SOLN
INTRAVENOUS | Status: AC
  Filled 2019-03-13: qty 10

## 2019-03-13 MED ORDER — DORZOLAMIDE HCL-TIMOLOL MAL 2-0.5 % OP SOLN
OPHTHALMIC | Status: AC
  Filled 2019-03-13: qty 10

## 2019-03-13 MED ORDER — SUGAMMADEX SODIUM 200 MG/2ML IV SOLN
INTRAVENOUS | Status: DC | PRN
  Administered 2019-03-13: 200 mg via INTRAVENOUS

## 2019-03-13 MED ORDER — PHENYLEPHRINE HCL-NACL 10-0.9 MG/250ML-% IV SOLN
INTRAVENOUS | Status: DC | PRN
  Administered 2019-03-13: 50 ug/min via INTRAVENOUS

## 2019-03-13 MED ORDER — STERILE WATER FOR INJECTION IJ SOLN
INTRAMUSCULAR | Status: DC | PRN
  Administered 2019-03-13: 20 mL

## 2019-03-13 MED ORDER — STERILE WATER FOR INJECTION IJ SOLN
INTRAMUSCULAR | Status: AC
  Filled 2019-03-13: qty 10

## 2019-03-13 MED ORDER — ONDANSETRON HCL 4 MG/2ML IJ SOLN
INTRAMUSCULAR | Status: AC
  Filled 2019-03-13: qty 2

## 2019-03-13 MED ORDER — STERILE WATER FOR IRRIGATION IR SOLN
Status: DC | PRN
  Administered 2019-03-13: 200 mL

## 2019-03-13 MED ORDER — BRIMONIDINE TARTRATE 0.2 % OP SOLN
OPHTHALMIC | Status: DC | PRN
  Administered 2019-03-13: 1 [drp] via OPHTHALMIC

## 2019-03-13 MED ORDER — OXYCODONE HCL 5 MG/5ML PO SOLN: 5.0000 mg | Freq: Once | ORAL | Status: DC | PRN

## 2019-03-13 MED ORDER — PROPOFOL 10 MG/ML IV BOLUS
INTRAVENOUS | Status: AC
  Filled 2019-03-13: qty 20

## 2019-03-13 MED ORDER — POLYMYXIN B SULFATE 500000 UNITS IJ SOLR
INTRAMUSCULAR | Status: AC
  Filled 2019-03-13: qty 500000

## 2019-03-13 MED ORDER — FENTANYL CITRATE (PF) 100 MCG/2ML IJ SOLN
INTRAMUSCULAR | Status: DC | PRN
  Administered 2019-03-13 (×2): 50 ug via INTRAVENOUS
  Administered 2019-03-13: 100 ug via INTRAVENOUS
  Administered 2019-03-13: 50 ug via INTRAVENOUS

## 2019-03-13 MED ORDER — BACITRACIN-POLYMYXIN B 500-10000 UNIT/GM OP OINT
TOPICAL_OINTMENT | OPHTHALMIC | Status: AC
  Filled 2019-03-13: qty 3.5

## 2019-03-13 MED ORDER — LIDOCAINE 2% (20 MG/ML) 5 ML SYRINGE
INTRAMUSCULAR | Status: DC | PRN
  Administered 2019-03-13: 30 mg via INTRAVENOUS

## 2019-03-13 MED ORDER — DEXMEDETOMIDINE HCL IN NACL 80 MCG/20ML IV SOLN
INTRAVENOUS | Status: AC
  Filled 2019-03-13: qty 20

## 2019-03-13 MED ORDER — FENTANYL CITRATE (PF) 100 MCG/2ML IJ SOLN
INTRAMUSCULAR | Status: AC
  Filled 2019-03-13: qty 2

## 2019-03-13 MED ORDER — BSS IO SOLN
INTRAOCULAR | Status: DC | PRN
  Administered 2019-03-13 (×2): 15 mL

## 2019-03-13 MED ORDER — ESMOLOL HCL 100 MG/10ML IV SOLN
INTRAVENOUS | Status: DC | PRN
  Administered 2019-03-13: 20 mg via INTRAVENOUS
  Administered 2019-03-13: 30 mg via INTRAVENOUS

## 2019-03-13 MED ORDER — DEXAMETHASONE SODIUM PHOSPHATE 10 MG/ML IJ SOLN
INTRAMUSCULAR | Status: DC | PRN
  Administered 2019-03-13: 5 mg via INTRAVENOUS

## 2019-03-13 MED ORDER — OXYCODONE HCL 5 MG PO TABS: 5.0000 mg | ORAL_TABLET | Freq: Once | ORAL | Status: DC | PRN

## 2019-03-13 MED ORDER — BACITRACIN-POLYMYXIN B 500-10000 UNIT/GM OP OINT
TOPICAL_OINTMENT | OPHTHALMIC | Status: DC | PRN
  Administered 2019-03-13 (×2): 1 via OPHTHALMIC

## 2019-03-13 MED ORDER — PROPOFOL 10 MG/ML IV BOLUS
INTRAVENOUS | Status: DC | PRN
  Administered 2019-03-13: 200 mg via INTRAVENOUS

## 2019-03-13 MED ORDER — ONDANSETRON HCL 4 MG/2ML IJ SOLN: 4.0000 mg | Freq: Four times a day (QID) | INTRAMUSCULAR | Status: DC | PRN

## 2019-03-13 MED ORDER — EPINEPHRINE PF 1 MG/ML IJ SOLN
INTRAOCULAR | Status: DC | PRN
  Administered 2019-03-13: 500.3 mL

## 2019-03-13 MED ORDER — FENTANYL CITRATE (PF) 100 MCG/2ML IJ SOLN
25.0000 ug | INTRAMUSCULAR | Status: DC | PRN
  Administered 2019-03-13: 50 ug via INTRAVENOUS

## 2019-03-13 MED ORDER — MIDAZOLAM HCL 2 MG/2ML IJ SOLN
INTRAMUSCULAR | Status: AC
  Filled 2019-03-13: qty 2

## 2019-03-13 MED ORDER — SODIUM CHLORIDE (PF) 0.9 % IJ SOLN
INTRAMUSCULAR | Status: AC
  Filled 2019-03-13: qty 10

## 2019-03-13 MED ORDER — PREDNISOLONE ACETATE 1 % OP SUSP
OPHTHALMIC | Status: AC
  Filled 2019-03-13: qty 5

## 2019-03-13 MED ORDER — PHENYLEPHRINE HCL 10 % OP SOLN
1.0000 [drp] | OPHTHALMIC | Status: AC | PRN
  Administered 2019-03-13 (×3): 1 [drp] via OPHTHALMIC
  Filled 2019-03-13: qty 5

## 2019-03-13 MED ORDER — CEFTAZIDIME 1 G IJ SOLR
INTRAMUSCULAR | Status: AC
  Filled 2019-03-13: qty 1

## 2019-03-13 SURGICAL SUPPLY — 67 items
APPLICATOR COTTON TIP 6 STRL (MISCELLANEOUS) ×8 IMPLANT
APPLICATOR COTTON TIP 6IN STRL (MISCELLANEOUS) ×16
BAND WRIST GAS GREEN (MISCELLANEOUS) ×2 IMPLANT
BLADE EYE CATARACT 19 1.4 BEAV (BLADE) IMPLANT
BLADE MVR KNIFE 20G (BLADE) ×4 IMPLANT
BNDG EYE OVAL (GAUZE/BANDAGES/DRESSINGS) ×4 IMPLANT
CABLE BIPOLOR RESECTION CORD (MISCELLANEOUS) ×4 IMPLANT
CANNULA ANT CHAM MAIN (OPHTHALMIC RELATED) IMPLANT
CANNULA FLEX TIP 25G (CANNULA) ×4 IMPLANT
CANNULA TROCAR 23 GA VLV (OPHTHALMIC) IMPLANT
CANNULA VLV SOFT TIP 25GA (OPHTHALMIC) IMPLANT
CLOSURE STERI-STRIP 1/2X4 (GAUZE/BANDAGES/DRESSINGS) ×1
CLSR STERI-STRIP ANTIMIC 1/2X4 (GAUZE/BANDAGES/DRESSINGS) ×3 IMPLANT
COTTONBALL LRG STERILE PKG (GAUZE/BANDAGES/DRESSINGS) IMPLANT
COVER WAND RF STERILE (DRAPES) IMPLANT
DRAPE MICROSCOPE LEICA 46X105 (MISCELLANEOUS) ×4 IMPLANT
DRAPE OPHTHALMIC 77X100 STRL (CUSTOM PROCEDURE TRAY) ×4 IMPLANT
ERASER HMR WETFIELD 23G BP (MISCELLANEOUS) IMPLANT
FILTER BLUE MILLIPORE (MISCELLANEOUS) IMPLANT
FILTER STRAW FLUID ASPIR (MISCELLANEOUS) IMPLANT
FORCEPS GRIESHABER ILM 25G A (INSTRUMENTS) IMPLANT
FORCEPS GRIESHABER MAX 25G (MISCELLANEOUS) ×4 IMPLANT
GAS AUTO FILL CONSTEL (OPHTHALMIC) ×4
GAS AUTO FILL CONSTELLATION (OPHTHALMIC) ×2 IMPLANT
GAS WRIST BAND GREEN (MISCELLANEOUS) ×2
GLOVE BIO SURGEON STRL SZ7.5 (GLOVE) ×8 IMPLANT
GLOVE BIOGEL M 7.0 STRL (GLOVE) ×8 IMPLANT
GLOVE BIOGEL PI IND STRL 8 (GLOVE) ×2 IMPLANT
GLOVE BIOGEL PI INDICATOR 8 (GLOVE) ×2
GOWN STRL REUS W/ TWL LRG LVL3 (GOWN DISPOSABLE) ×4 IMPLANT
GOWN STRL REUS W/ TWL XL LVL3 (GOWN DISPOSABLE) ×2 IMPLANT
GOWN STRL REUS W/TWL LRG LVL3 (GOWN DISPOSABLE) ×4
GOWN STRL REUS W/TWL XL LVL3 (GOWN DISPOSABLE) ×2
KIT BASIN OR (CUSTOM PROCEDURE TRAY) ×4 IMPLANT
KIT PERFLUORON PROCEDURE 5ML (MISCELLANEOUS) ×4 IMPLANT
LENS MACULAR ASPHERIC CONSTEL (OPHTHALMIC) IMPLANT
LENS VITRECTOMY FLAT OCLR DISP (MISCELLANEOUS) IMPLANT
LOOP FINESSE 25 GA (MISCELLANEOUS) IMPLANT
MICROPICK 25G (MISCELLANEOUS)
NEEDLE 18GX1X1/2 (RX/OR ONLY) (NEEDLE) ×4 IMPLANT
NEEDLE 25GX 5/8IN NON SAFETY (NEEDLE) ×20 IMPLANT
NEEDLE HYPO 30X.5 LL (NEEDLE) ×8 IMPLANT
NEEDLE PRECISIONGLIDE 27X1.5 (NEEDLE) IMPLANT
NS IRRIG 1000ML POUR BTL (IV SOLUTION) ×4 IMPLANT
PACK VITRECTOMY CUSTOM (CUSTOM PROCEDURE TRAY) ×4 IMPLANT
PAD ARMBOARD 7.5X6 YLW CONV (MISCELLANEOUS) ×8 IMPLANT
PAK PIK VITRECTOMY CVS 25GA (OPHTHALMIC) ×4 IMPLANT
PENCIL BIPOLAR 25GA STR DISP (OPHTHALMIC RELATED) ×4 IMPLANT
PICK MICROPICK 25G (MISCELLANEOUS) IMPLANT
PROBE DIATHERMY DSP 27GA (MISCELLANEOUS) IMPLANT
PROBE ENDO DIATHERMY 25G (MISCELLANEOUS) IMPLANT
PROBE LASER ILLUM FLEX CVD 25G (OPHTHALMIC) ×4 IMPLANT
REPL STRA BRUSH NEEDLE (NEEDLE) IMPLANT
RESERVOIR BACK FLUSH (MISCELLANEOUS) IMPLANT
RETRACTOR IRIS FLEX 25G GRIESH (INSTRUMENTS) IMPLANT
SET INJECTOR OIL FLUID CONSTEL (OPHTHALMIC) IMPLANT
SPONGE SURGIFOAM ABS GEL 12-7 (HEMOSTASIS) IMPLANT
STOPCOCK 4 WAY LG BORE MALE ST (IV SETS) IMPLANT
SUT VICRYL 7 0 TG140 8 (SUTURE) ×4 IMPLANT
SYR 10ML LL (SYRINGE) ×4 IMPLANT
SYR 20ML LL LF (SYRINGE) ×4 IMPLANT
SYR 5ML LL (SYRINGE) ×4 IMPLANT
SYR BULB 3OZ (MISCELLANEOUS) ×4 IMPLANT
SYR TB 1ML LUER SLIP (SYRINGE) ×8 IMPLANT
TOWEL GREEN STERILE FF (TOWEL DISPOSABLE) ×4 IMPLANT
TUBING HIGH PRESS EXTEN 6IN (TUBING) ×4 IMPLANT
WATER STERILE IRR 1000ML POUR (IV SOLUTION) ×4 IMPLANT

## 2019-03-13 NOTE — Discharge Instructions (Signed)
POSTOPERATIVE INSTRUCTIONS  Your doctor has performed vitreoretinal surgery on you at [facility]. [place] eye patched and shielded until seen by Dr. Coralyn Pear 845 AM tomorrow in clinic - Do not use drops until return - [place] - Sleep with belly down or on left side, avoid laying flat on back.    - No strenuous bending, stooping or lifting.  - You may not drive until further notice.  - If your doctor used a gas bubble in your eye during the procedure he will advise you on postoperative positioning. If you have a gas bubble you will be wearing a green bracelet that was applied in the operating room. The green bracelet should stay on as long as the gas bubble is in your eye. While the gas bubble is present you should not fly in an airplane. If you require general anesthesia while the gas bubble is present you must notify your anesthesiologist that an intraocular gas bubble is present so he can take the appropriate precautions.  - Tylenol or any other over-the-counter pain reliever can be used according to your doctor. If more pain medicine is required, your doctor will have a prescription for you.  - You may read, go up and down stairs, and watch television.     [name], M.D., Ph.D.

## 2019-03-13 NOTE — Progress Notes (Signed)
Patient will not keep nasal cannula on.  Currently 90% sO2 on room air.  Will continue to monitor.

## 2019-03-13 NOTE — H&P (Signed)
Jonathan [name] is an 68 y.o. male.    Chief Complaint: decreased vision, RIGHT EYE  HPI: 68 yo M with history of dislocated IOL OD who presented today for f/u with decreased vision OD. On dilated exam, found to have a rhegmatogenous retinal detachment in RIGHT EYE and retinal breaks in the LEFT EYE. After a discussion of the risks, benefits and alternatives to surgery, pt elects to proceed with surgery for IOL explantation and retinal detachment repair, RIGHT EYE, and indirect laser retinopexy, LEFT EYE.   Past Medical History:  Diagnosis Date  . Cataract   . COPD (chronic obstructive pulmonary disease) ([place])   . Depression   . Diabetes mellitus without complication ([place])   . HTN (hypertension)   . Hyperlipidemia   . Sleep apnea   . Stroke [facility])     Past Surgical History:  Procedure Laterality Date  . CATARACT EXTRACTION Bilateral    Dr. [name]  . CIRCUMCISION    . none      Family History  Problem Relation Age of Onset  . Cancer Mother        brain tumor  . Heart disease Father   . Cirrhosis Father    Social History:  reports that he has been smoking cigarettes. He has a 43.00 pack-year smoking history. He has never used smokeless tobacco. He reports that he does not drink alcohol or use drugs.  Allergies: No Known Allergies  Medications Prior to Admission  Medication Sig Dispense Refill  . amLODipine (NORVASC) 5 MG tablet Take 1 tablet (5 mg total) by mouth daily. 90 tablet 3  . atorvastatin (LIPITOR) 40 MG tablet Take 1 tablet (40 mg total) by mouth daily. 90 tablet 1  . buPROPion (WELLBUTRIN SR) 150 MG 12 hr tablet Take 1 tablet (150 mg total) by mouth 2 (two) times daily. 60 tablet 2  . clopidogrel (PLAVIX) 75 MG tablet Take 1 tablet (75 mg total) by mouth daily. 90 tablet 3  . fexofenadine (ALLEGRA ALLERGY) 180 MG tablet Take 1 tablet (180 mg total) by mouth daily. 30 tablet 3  . fluticasone (FLONASE) 50 MCG/ACT nasal spray Place 1 spray into both nostrils daily.  16 g 2  . lisinopril (PRINIVIL,ZESTRIL) 20 MG tablet Take 1 tablet (20 mg total) by mouth daily. 90 tablet 3  . metFORMIN (GLUCOPHAGE) 500 MG tablet Take 1 tablet (500 mg total) by mouth 2 (two) times daily with a meal. 180 tablet 3  . prednisoLONE acetate (PRED FORTE) 1 % ophthalmic suspension Place 1 drop into the right eye 6 (six) times daily. 15 mL 0  . VENTOLIN HFA 108 (90 Base) MCG/ACT inhaler INHALE 2 PUFFS BY MOUTH EVERY 4 HOURS AS NEEDED FOR WHEEZING OR SHORTNESS OF BREATH. (Patient taking differently: Inhale 2 puffs into the lungs every 4 (four) hours as needed for wheezing or shortness of breath. ) 18 each 2  . Blood Glucose Monitoring Suppl (ONETOUCH VERIO) w/Device KIT Check blood sugar once per day 1 kit 0  . glucose blood (ONETOUCH VERIO) test strip Check sugar once per day 100 each 3  . Lancet Devices (ONE TOUCH DELICA LANCING DEV) MISC Check sugar once per day 1 each 0  . OneTouch Delica Lancets 38V MISC Check sugar once per day 100 each 3    Review of systems otherwise negative  There were no vitals taken for this visit.  Physical exam: Mental status: oriented x3. Eyes: See eye exam associated with this date of surgery Ears, Nose,  Throat: within normal limits Neck: Within Normal limits General: within normal limits Chest: Within normal limits Breast: deferred Heart: Within normal limits Abdomen: Within normal limits GU: deferred Extremities: within normal limits Skin: within normal limits  Assessment/Plan 1. Retinal detachment, RIGHT EYE 2. Dislocated IOL, RIGHT EYE 3. Multiple retinal breaks, LEFT EYE  Plan: To [facility] for URGENT 25g PPV w/ IOL explantation, endolaser and gas, RIGHT EYE + indirect laser retinopexy, LEFT EYE under general anesthesia. - case scheduled for 11.18.2020, 530 pm, [facility] OR 08  Gardiner Sleeper, M.D., Ph.D. Vitreoretinal Surgeon [facility]

## 2019-03-13 NOTE — Anesthesia Preprocedure Evaluation (Signed)
Anesthesia Evaluation  Patient identified by MRN, date of birth, ID band Patient awake    Reviewed: Allergy & Precautions, H&P , NPO status , Patient's Chart, lab work & pertinent test results  Airway Mallampati: II   Neck ROM: full    Dental   Pulmonary sleep apnea , COPD, Current Smoker,    breath sounds clear to auscultation       Cardiovascular hypertension,  Rhythm:regular Rate:Normal     Neuro/Psych PSYCHIATRIC DISORDERS Depression CVA    GI/Hepatic   Endo/Other  diabetes, Type 2  Renal/GU      Musculoskeletal   Abdominal   Peds  Hematology   Anesthesia Other Findings   Reproductive/Obstetrics                             Anesthesia Physical Anesthesia Plan  ASA: III  Anesthesia Plan: General   Post-op Pain Management:    Induction: Intravenous  PONV Risk Score and Plan: 1 and Ondansetron, Dexamethasone and Treatment may vary due to age or medical condition  Airway Management Planned: Oral ETT  Additional Equipment:   Intra-op Plan:   Post-operative Plan: Extubation in OR  Informed Consent: I have reviewed the patients History and Physical, chart, labs and discussed the procedure including the risks, benefits and alternatives for the proposed anesthesia with the patient or authorized representative who has indicated his/her understanding and acceptance.       Plan Discussed with: CRNA, Anesthesiologist and Surgeon  Anesthesia Plan Comments:         Anesthesia Quick Evaluation

## 2019-03-13 NOTE — OR Nursing (Signed)
Green Gas Alert bracelet applied to patient's Right wrist prior to taking to Post Anesthesia Care Unit.

## 2019-03-13 NOTE — Op Note (Signed)
Date of procedure: 11.18.2020  Surgeon: [name], MD, PhD  Pre-operative Diagnosis:  Macula off Rhegmatogenous Retinal Detachment, Right Eye Dislocated IOL, Right Eye Retinal breaks, Left Eye  Post-operative diagnosis: Macula off Rhegmatogenous Retinal Detachment, Right Eye Dislocated IOL, Right Eye Retinal breaks, Left Eye  Anesthesia: GETA  Procedures: 1)     Laser indirect ophthalmoscopy, Left Eye CPT 67145 2)   25 gauge pars plana vitrectomy, Right Eye CPT [phone] 3)   IOL explantation, Right Eye 4)     Fluid-air exchange, Right Eye 5)   Endolaser, Right Eye 6)   Injection of [id number] 0000000 gas  Complications: none Estimated blood loss: minimal Specimens: none  Brief history: The patient has a history of dislocated IOL and decreased vision in the affected right eye, and on examination, was noted to have a macula-involving retinal detachment, affecting activities of daily living. The risks, benefits, and alternatives were explained to the patient, including pain, bleeding, infection, loss of vision, double vision, droopy eyelids, and need for more surgeries. Informed consent was obtained from the patient and placed in the chart.   Procedure:  The patient was brought to the preoperative holding area where the correct eyes were confirmed and marked. The patient was then brought to the operating room where general endotracheal anesthesia was induced. A time-out was performed to identify the correct patient, eyes, procedures, and any allergies.   Attention was first turned to the left eye and laser retinopexy was applied to retinal tears located at 1200, 0430 and 730 using the laser indirect ophthalmoscope with scleral indentation. 1076 spots were placed in total to treat the retinal breaks with at least 3 rows of barricade laser surrounding each lesion  Next, attention was turned to the right eye portion of the operation. The right eye was prepped and draped in the  usual sterile ophthalmic fashion followed by placement of a lid speculum.  A 25 gauge trocar was placed in the inferotemporal quadrant in a beveled fashion. A 4 mm infusion cannula was placed through this trocar, and the infusion cannula was confirmed in the vitreous cavity with no incarceration of retina or choroid prior to turning it on. Two additional 25 gauge trocars were placed in the superonasal and superotemporal quadrants (2 and 10 oclock, respectively) in a similar beveled fashion. At this time, direct anterior vitrectomy was performed to remove the anterior hyaloid and vitreous as well as some portions of the lens capsule. Next, the BIOM viewing system was swung into place and a standard three-port pars plana vitrectomy was performed using the light pipe and vitrectomy probe. A thorough core and peripheral vitreous dissection was performed. A posterior vitreous detachment was confirmed over the optic nerve.There was a macula-involving, superior retinal detachment from 10 to 2. There were 4 tears within the detached retina spanning 1030 to 1230. Traction was removed from all retinal breaks. The breaks were trimmed using the cutter to smooth the edges.  Next, attention was turned to the explantation of the dislocated IOL. A 1-piece IOL was displaced inferiorly through a large rent in the posterior capsule with the superior haptic hanging on to the residual lens capsule. Anterior chamber paracenteses were created nasally and temporally. Viscoat was injected into the anterior chamber to protect the corneal endothelium A cyclodialysis spatula through a paracentesis and the light pipe through a trocar were used to bimanually maneuver the IOL into the [facility], anterior to the iris diaphragm. A 2.75 microkeratome blade was used to create a triplanar wound  at 12 oclock -- the wound was extended to a width of ~30mm. A lens cutting scissor was used to make a radial cut into the IOL, through the center. A MaxGrip forcep  was used to grab a corner of the IOL incision and the IOL was spun out of the anterior chamber of the eye "Pac-Man" style. An interrupted 10-0 nylon suture was placed to close the superior corneal wound, making it water-tight.  Direct vitrectomy was used to remove residual capsular remnants. Next, the BIOM was swung back into place. Perfluoron was injected posteriorly and additional peripheral vitreous trimming was performed. A complete fluid-air exchange was performed with a soft tip extrusion cannula over the superior retinal breaks. After completion of these maneuvers, the retina was flat. Under air, endolaser was applied to all the breaks and 360 peripherally.  The superotemporal trocar was removed and sutured with 7-0 vicryl in an interrupted fashion. Viscoat was injected to coat the iris and endothelium. A complete air to 13% C3F8 gas exchange was performed through the infusion cannula and vented through the superonasal trocar using the extrusion cannula.The superonasal trocar and infusion cannula and associated trocar were then removed and sutured with 7-0 vicryl in an interrupted fashion.  The eye was confirmed to be at a physiologic level by digital palpation. Subconjunctival injections of Antibiotic and kenalog were administered. The lid speculum and drapes were removed. Drops of an antibiotic, antihypertensives, and steroid were given. Copious antibiotic ointment was instilled into the eye. The eye was patched and shielded. The patient tolerated the procedure well without any intraoperative or immediate postoperative complications. The patient was taken to the recovery room in good condition. The patient was instructed to maintain a strict face-down position and will be seen by Dr. Coralyn Pear tomorrow morning in clinic.

## 2019-03-13 NOTE — Brief Op Note (Signed)
03/13/2019  10:14 PM  PATIENT:  Isaiah Blakes  68 y.o. male  PRE-OPERATIVE DIAGNOSIS:  right eye dislocated IOL and detached retina.  Left eye with retinal tears  POST-OPERATIVE DIAGNOSIS:  right eye dislocated IOL and detached retina.  Left eye with retinal tears  PROCEDURE:  Procedure(s): 25 GAUGE PARS PLANA VITRECTOMY WITH  INTRAOCULAR LENSE EXPLANTATION RIGHT EYE. (Right) INDIRECT PHOTOCOAGULATION WITH LASER LEFT EYE (Left) INSERTION OF GAS (C3F8) RIGHT EYE (Right) LASER PHOTO  ABLATION RIGHT EYE (Right)  SURGEON:  Surgeon(s) and Role:    [name], MD - Primary  ANESTHESIA:   general  EBL:  minimal   BLOOD ADMINISTERED:none  DRAINS: none   LOCAL MEDICATIONS USED:  NONE  SPECIMEN:  No Specimen  DISPOSITION OF SPECIMEN:  N/A  COUNTS:  YES  TOURNIQUET:  * No tourniquets in log *  DICTATION: .Note written in EPIC  PLAN OF CARE: Discharge to home after PACU  PATIENT DISPOSITION:  PACU - hemodynamically stable.   Delay start of Pharmacological VTE agent (>24hrs) due to surgical blood loss or risk of bleeding: not applicable

## 2019-03-13 NOTE — Transfer of Care (Signed)
Immediate Anesthesia Transfer of Care Note  Patient: Jonathan [name]  Procedure(s) Performed: 25 GAUGE PARS PLANA VITRECTOMY WITH  INTRAOCULAR LENSE EXPLANTATION RIGHT EYE. (Right Eye) INDIRECT PHOTOCOAGULATION WITH LASER LEFT EYE (Left Eye) INSERTION OF GAS (C3F8) RIGHT EYE (Right Eye) LASER PHOTO  ABLATION RIGHT EYE (Right Eye)  Patient Location: PACU  Anesthesia Type:General  Level of Consciousness: awake and alert   Airway & Oxygen Therapy: Patient Spontanous Breathing and Patient connected to nasal cannula oxygen  Post-op Assessment: Report given to RN and Post -op Vital signs reviewed and stable  Post vital signs: Reviewed and stable  Last Vitals:  Vitals Value Taken Time  BP 192/87 03/13/19 2213  Temp    Pulse 72 03/13/19 2217  Resp 16 03/13/19 2217  SpO2 89 % 03/13/19 2217  Vitals shown include unvalidated device data.  Last Pain:  Vitals:   03/13/19 1549  TempSrc: Oral         Complications: No apparent anesthesia complications

## 2019-03-14 ENCOUNTER — Encounter (INDEPENDENT_AMBULATORY_CARE_PROVIDER_SITE_OTHER): Payer: Self-pay | Admitting: Ophthalmology

## 2019-03-14 ENCOUNTER — Ambulatory Visit (INDEPENDENT_AMBULATORY_CARE_PROVIDER_SITE_OTHER): Payer: Medicare Other | Admitting: Ophthalmology

## 2019-03-14 DIAGNOSIS — H3581 Retinal edema: Secondary | ICD-10-CM

## 2019-03-14 DIAGNOSIS — I10 Essential (primary) hypertension: Secondary | ICD-10-CM

## 2019-03-14 DIAGNOSIS — H35033 Hypertensive retinopathy, bilateral: Secondary | ICD-10-CM

## 2019-03-14 DIAGNOSIS — T8522XA Displacement of intraocular lens, initial encounter: Secondary | ICD-10-CM

## 2019-03-14 DIAGNOSIS — Z961 Presence of intraocular lens: Secondary | ICD-10-CM

## 2019-03-14 DIAGNOSIS — E119 Type 2 diabetes mellitus without complications: Secondary | ICD-10-CM

## 2019-03-14 DIAGNOSIS — H3589 Other specified retinal disorders: Secondary | ICD-10-CM

## 2019-03-14 DIAGNOSIS — H3321 Serous retinal detachment, right eye: Secondary | ICD-10-CM

## 2019-03-14 NOTE — Progress Notes (Signed)
[place] [facility] Note  03/14/2019     CHIEF COMPLAINT Patient presents for Post-op Follow-up   HISTORY OF PRESENT ILLNESS: Jonathan [name] is a 68 y.o. male who presents to the clinic today for:   HPI    Post-op Follow-up    In right eye.  Discomfort includes pain.  I, the attending physician,  performed the HPI with the patient and updated documentation appropriately.          Comments    Pt states his vision is stable.  Patient states right eye is sore and scratchy today.  Patient denies any new or worsening floaters or fol OU.       Last edited by [name], MD on 03/14/2019  9:23 AM. (History)    pt states he slept okay last night, he states as far as he knows, he slept on his left side, he states his right eye feels "scratchy"  Referring physician: [name], MD [place], [place] [address]   HISTORICAL INFORMATION:   Selected notes from the MEDICAL RECORD NUMBER Referred by Dr. [name] for evaluation of dislocated IOL   LEE: 10.01.20, BCVA OD: 20/70-1 OS: 20/50-2 PMH: DM,HTN, stroke; last a1c was 7.2 on 10.03.19   CURRENT MEDICATIONS: Current Outpatient Medications (Ophthalmic Drugs)  Medication Sig  . prednisoLONE acetate (PRED FORTE) 1 % ophthalmic suspension Place 1 drop into the right eye 6 (six) times daily.   No current facility-administered medications for this visit.  (Ophthalmic Drugs)   Current Outpatient Medications (Other)  Medication Sig  . amLODipine (NORVASC) 5 MG tablet Take 1 tablet (5 mg total) by mouth daily.  Marland Kitchen atorvastatin (LIPITOR) 40 MG tablet Take 1 tablet (40 mg total) by mouth daily.  . Blood Glucose Monitoring Suppl (ONETOUCH VERIO) w/Device KIT Check blood sugar once per day  . buPROPion (WELLBUTRIN SR) 150 MG 12 hr tablet Take 1 tablet (150 mg total) by mouth 2 (two) times daily.  . clopidogrel (PLAVIX) 75 MG tablet Take 1 tablet (75 mg total) by mouth daily.  . fexofenadine (ALLEGRA  ALLERGY) 180 MG tablet Take 1 tablet (180 mg total) by mouth daily.  . fluticasone (FLONASE) 50 MCG/ACT nasal spray Place 1 spray into both nostrils daily.  Marland Kitchen glucose blood (ONETOUCH VERIO) test strip Check sugar once per day  . Lancet Devices (ONE TOUCH DELICA LANCING DEV) MISC Check sugar once per day  . lisinopril (PRINIVIL,ZESTRIL) 20 MG tablet Take 1 tablet (20 mg total) by mouth daily.  . metFORMIN (GLUCOPHAGE) 500 MG tablet Take 1 tablet (500 mg total) by mouth 2 (two) times daily with a meal.  . OneTouch Delica Lancets 16W MISC Check sugar once per day  . VENTOLIN HFA 108 (90 Base) MCG/ACT inhaler INHALE 2 PUFFS BY MOUTH EVERY 4 HOURS AS NEEDED FOR WHEEZING OR SHORTNESS OF BREATH. (Patient taking differently: Inhale 2 puffs into the lungs every 4 (four) hours as needed for wheezing or shortness of breath. )   No current facility-administered medications for this visit.  (Other)      REVIEW OF SYSTEMS: ROS    Positive for: Endocrine, Cardiovascular, Eyes   Negative for: Constitutional, Gastrointestinal, Neurological, Skin, Genitourinary, Musculoskeletal, HENT, Respiratory, Psychiatric, Allergic/Imm, Heme/Lymph   Last edited by Doneen Poisson on 03/14/2019  8:50 AM. (History)       ALLERGIES No Known Allergies  PAST MEDICAL HISTORY Past Medical History:  Diagnosis Date  . Cataract   .  COPD (chronic obstructive pulmonary disease) ([place])   . Depression   . Diabetes mellitus without complication ([place])   . HTN (hypertension)   . Hyperlipidemia   . Sleep apnea   . Stroke [facility])    Past Surgical History:  Procedure Laterality Date  . CATARACT EXTRACTION Bilateral    Dr. [name]  . CIRCUMCISION    . none      FAMILY HISTORY Family History  Problem Relation Age of Onset  . Cancer Mother        brain tumor  . Heart disease Father   . Cirrhosis Father     SOCIAL HISTORY Social History   Tobacco Use  . Smoking status: Current Every Day Smoker    Packs/day: 1.00     Years: 43.00    Pack years: 43.00    Types: Cigarettes  . Smokeless tobacco: Never Used  . Tobacco comment: pt is trying to aggressively cut back on # cigs smoked daily.   Substance Use Topics  . Alcohol use: No  . Drug use: No         OPHTHALMIC EXAM:  Base Eye Exam    Visual Acuity (Snellen - Linear)      Right Left   Dist [place] HM 20/30 -1   Dist ph [place] NI 20/25 -2       Tonometry (Tonopen, 8:53 AM)      Right Left   Pressure 24 16       Pupils      Dark   Right Dilated   Left Dilated       Visual Fields      Left Right    Full    Restrictions  Total superior temporal, inferior temporal, superior nasal, inferior nasal deficiencies       Extraocular Movement      Right Left    Full Full       Neuro/Psych    Oriented x3: Yes   Mood/Affect: Normal       Dilation    Both eyes: 1.0% Mydriacyl, 2.5% Phenylephrine @ 8:53 AM        Slit Lamp and Fundus Exam    Slit Lamp Exam      Right Left   Lids/Lashes Dermatochalasis - upper lid, multiple papillomas, Telangiectasia, mild Meibomian gland dysfunction Dermatochalasis - upper lid, multiple papillomas, Telangiectasia, mild Meibomian gland dysfunction   Conjunctiva/Sclera Mild Subconjunctival hemorrhage, sutures intact Pinguecula, trace temporal Injection   Cornea Arcus, well healed temporal cataract wounds, 3-4+ Punctate epithelial erosions, focal corneal scar 0900 midzone, superior cataract wound at 1200 with 10-0 nylon suture, mild descemet folds surrounding Arcus, Debris in tear film, 2+Punctate epithelial erosions   Anterior Chamber Deep, 2-3+ Cell/pigment Deep and quiet   Iris Round and moderately dilated Round and dilated   Lens Aphakic Posterior chamber intraocular lens in good position   Vitreous post vitrectomy Vitreous syneresis       Fundus Exam      Right Left   Disc Pink and Sharp Pink and Sharp, mild temporal PPP   C/D Ratio 0.4 0.4   Macula Flat, Blunted foveal reflex, No heme or edema  Flat, Blunted foveal reflex, No heme or edema   Vessels Vascular attenuation, very Tortuous, AV crossing changes Vascular attenuation, very Tortuous, AV crossing changes   Periphery Hazy view, grossly attached; early laser changes 360; ORIGINALLY: Bullous superior detachment from 1000-130 with multiple tears at 1030 and 1100 Attached, paving stone degeneration inferiorly, retinal hole at  1200 and tears at 430 and 0730; good laser surrounding breaks at 1200, 0430 and 0730          IMAGING AND PROCEDURES  Imaging and Procedures for _0 @           ASSESSMENT/PLAN:    ICD-10-CM   1. Right retinal detachment  H33.21   2. Retinal edema  H35.81   3. Dislocation of intraocular lens, initial encounter  T85.22XA   4. Retinal breaks without detachment  H35.89   5. Essential hypertension  I10   6. Hypertensive retinopathy of both eyes  H35.033   7. Diabetes mellitus type 2 without retinopathy ([place])  E11.9   8. Pseudophakia of both eyes  Z96.1     1,2. Rhegmatogenous retinal detachment, right eye  - bullous superior mac off detachment, onset ~1 wk, with onset of foveal involvement Tuesday, 03/12/19 by pt history  - detached from 1000 to 0130, fovea off, multiple tears at 1030 and 1100  - now POD1 s/p PPV/IOL explanation/EL/FAX/14% C3F8 OD, 11.18.20             - doing well this morning             - retina attached and in good position              - IOP mildly elevated at 24             - start   PF 6x/day OD                          zymaxid QID OD                          Atropine QD OD                           Brimonidine BID OD                          Cosopt BID OD                         PSO ung QID OD              - cont face down positioning x3 days; avoid laying flat on back              - eye shield when sleeping              - post op drop and positioning instructions reviewed              - tylenol/ibuprofen for pain   - f/u Tuesday / Wednesday next week  3.  Dislocated IOL OD  - s/p CE/IOL OD on 10.21.20 w/ Dr. [name]  - IOL displaced inferiorly with superior haptic hung up in capsular remnant   - poor dilation -- ?pseudoexfoliation, floppy iris OD  - developed macula/fovea involving RD as above  - s/p IOL explantation during RD repair 11.18.20 as above  - will plan for sutured [place] IOL once RD resolved and healed  - IOL master measurements obtained 11.5.2020  4. Retinal breaks without detachment OS  - scleral depressed exam of OS completed as part of RD work up  - large retinal break at 12, smaller flap tears at 0430 amd 0730  - s/p laser retinopexy  OS (11.18.20) -- good laser around breaks at 1200, 0430 and 0730  5,6. Hypertensive retinopathy OU  - discussed importance of tight BP control  - monitor  7. Diabetes mellitus, type 2 without retinopathy  - The incidence, risk factors for progression, natural history and treatment options for diabetic retinopathy  were discussed with patient.    - The need for close monitoring of blood glucose, blood pressure, and serum lipids, avoiding cigarette or any type of tobacco, and the need for long term follow up was also discussed with patient.  - f/u in 1 year, sooner prn  8. Pseudophakia OU  - s/p CE/IOL OU (Dr. [name], October 2020)  - subluxed IOL OD as above - s/p IOL explantation on 11.18.2020  - OS doing well  - post op drops OS per Dr. [name]  - monitor   Ophthalmic Meds Ordered this visit:  No orders of the defined types were placed in this encounter.      Return in about 5 days (around 03/19/2019) for POV s/p RD OD / s/p laser retinopexy OS, DFE OU.  There are no Patient Instructions on file for this visit.   Explained the diagnoses, plan, and follow up with the patient and they expressed understanding.  Patient expressed understanding of the importance of proper follow up care.   This document serves as a record of services personally performed by Gardiner Sleeper, MD,  PhD. It was created on their behalf by Ernest Mallick, OA, an ophthalmic assistant. The creation of this record is the provider's dictation and/or activities during the visit.    Electronically signed by: Ernest Mallick, OA 11.19.2020 9:23 AM  Gardiner Sleeper, M.D., Ph.D. Diseases & Surgery of the Retina and Vitreous [facility]  I have reviewed the above documentation for accuracy and completeness, and I agree with the above. Gardiner Sleeper, M.D., Ph.D. 03/14/19 9:25 AM    Abbreviations: M myopia (nearsighted); A astigmatism; H hyperopia (farsighted); P presbyopia; Mrx spectacle prescription;  CTL contact lenses; OD right eye; OS left eye; OU both eyes  XT exotropia; ET esotropia; PEK punctate epithelial keratitis; PEE punctate epithelial erosions; DES dry eye syndrome; MGD meibomian gland dysfunction; ATs artificial tears; PFAT's preservative free artificial tears; [place] nuclear sclerotic cataract; PSC posterior subcapsular cataract; ERM epi-retinal membrane; PVD posterior vitreous detachment; RD retinal detachment; DM diabetes mellitus; DR diabetic retinopathy; NPDR non-proliferative diabetic retinopathy; PDR proliferative diabetic retinopathy; CSME clinically significant macular edema; DME diabetic macular edema; dbh dot blot hemorrhages; CWS cotton wool spot; POAG primary open angle glaucoma; C/D cup-to-disc ratio; HVF humphrey visual field; GVF goldmann visual field; OCT optical coherence tomography; IOP intraocular pressure; BRVO Branch retinal vein occlusion; CRVO central retinal vein occlusion; CRAO central retinal artery occlusion; BRAO branch retinal artery occlusion; RT retinal tear; SB scleral buckle; PPV pars plana vitrectomy; VH Vitreous hemorrhage; PRP panretinal laser photocoagulation; IVK intravitreal kenalog; VMT vitreomacular traction; MH Macular hole;  NVD neovascularization of the disc; NVE neovascularization elsewhere; AREDS age related eye disease study; ARMD  age related macular degeneration; POAG primary open angle glaucoma; EBMD epithelial/anterior basement membrane dystrophy; ACIOL anterior chamber intraocular lens; IOL intraocular lens; PCIOL posterior chamber intraocular lens; Phaco/IOL phacoemulsification with intraocular lens placement; [place] photorefractive keratectomy; LASIK laser assisted in situ keratomileusis; HTN hypertension; DM diabetes mellitus; COPD chronic obstructive pulmonary disease

## 2019-03-14 NOTE — Anesthesia Postprocedure Evaluation (Signed)
Anesthesia Post Note  Patient: Jonathan [name]  Procedure(s) Performed: 25 GAUGE PARS PLANA VITRECTOMY WITH  INTRAOCULAR LENSE EXPLANTATION RIGHT EYE. (Right Eye) INDIRECT PHOTOCOAGULATION WITH LASER LEFT EYE (Left Eye) INSERTION OF GAS (C3F8) RIGHT EYE (Right Eye) LASER PHOTO  ABLATION RIGHT EYE (Right Eye)     Patient location during evaluation: PACU Anesthesia Type: General Level of consciousness: awake and alert Pain management: pain level controlled Vital Signs Assessment: post-procedure vital signs reviewed and stable Respiratory status: spontaneous breathing, nonlabored ventilation, respiratory function stable and patient connected to nasal cannula oxygen Cardiovascular status: blood pressure returned to baseline and stable Postop Assessment: no apparent nausea or vomiting Anesthetic complications: no    Last Vitals:  Vitals:   03/13/19 2245 03/13/19 2258  BP: (!) 160/104 (!) 185/91  Pulse: 60 68  Resp: 15 (!) 9  Temp:  37.2 C  SpO2: (!) 89% 95%    Last Pain:  Vitals:   03/13/19 2258  TempSrc:   PainSc: 4                  Agness Sibrian COKER

## 2019-03-14 NOTE — Progress Notes (Signed)
[facility] Note  03/19/2019     CHIEF COMPLAINT Patient presents for Post-op Follow-up   HISTORY OF PRESENT ILLNESS: Jonathan [name] is a 68 y.o. male who presents to the clinic today for:   HPI    Post-op Follow-up    In right eye.  Discomfort includes none.  Vision is improved, is blurred at distance and is blurred at near.  I, the attending physician,  performed the HPI with the patient and updated documentation appropriately.          Comments    68 y/o male pt here for 1 wk f/u s/p PPV for rheg RD repair OD 11.18.20.  S/p IOL explantation OD during RD repair sx.  S/p laser retinopexy OS 11.18.20 for repair of larger retinal break and smaller tears.  VA slightly improved OD.  Can now count fingers.  No change in [place] OS.  Denies pain, flashes, floaters.  PF 6x/day OD Zymaxid QID OD Atropine QD OD Brimonidine QD OD Cosopt BID OD PSO ung QID OD         Last edited by [name], MD on 03/19/2019 11:41 PM. (History)    pt states he is doing well, he states he is using the drops as directed and he is not laying on his back, he has no eye pain, but some scratchiness   Referring physician: [name], MD [place], [place] [id number]   HISTORICAL INFORMATION:   Selected notes from the MEDICAL RECORD NUMBER Referred by Dr. [name] for evaluation of dislocated IOL   CURRENT MEDICATIONS: Current Outpatient Medications (Ophthalmic Drugs)  Medication Sig  . bacitracin-polymyxin b (POLYSPORIN) ophthalmic ointment Place into the right eye 4 (four) times daily. Place a 1/2 inch ribbon of ointment into the lower eyelid.  . prednisoLONE acetate (PRED FORTE) 1 % ophthalmic suspension Place 1 drop into the right eye 6 (six) times daily.   No current facility-administered medications for this visit.  (Ophthalmic Drugs)   Current Outpatient Medications (Other)  Medication Sig  . amLODipine (NORVASC) 5 MG tablet Take 1 tablet (5 mg  total) by mouth daily.  Marland Kitchen atorvastatin (LIPITOR) 40 MG tablet Take 1 tablet (40 mg total) by mouth daily.  . Blood Glucose Monitoring Suppl (ONETOUCH VERIO) w/Device KIT Check blood sugar once per day  . buPROPion (WELLBUTRIN SR) 150 MG 12 hr tablet Take 1 tablet (150 mg total) by mouth 2 (two) times daily.  . clopidogrel (PLAVIX) 75 MG tablet Take 1 tablet (75 mg total) by mouth daily.  . fexofenadine (ALLEGRA ALLERGY) 180 MG tablet Take 1 tablet (180 mg total) by mouth daily.  . fluticasone (FLONASE) 50 MCG/ACT nasal spray Place 1 spray into both nostrils daily.  Marland Kitchen glucose blood (ONETOUCH VERIO) test strip Check sugar once per day  . Lancet Devices (ONE TOUCH DELICA LANCING DEV) MISC Check sugar once per day  . lisinopril (PRINIVIL,ZESTRIL) 20 MG tablet Take 1 tablet (20 mg total) by mouth daily.  . metFORMIN (GLUCOPHAGE) 500 MG tablet Take 1 tablet (500 mg total) by mouth 2 (two) times daily with a meal.  . OneTouch Delica Lancets 55D MISC Check sugar once per day  . VENTOLIN HFA 108 (90 Base) MCG/ACT inhaler INHALE 2 PUFFS BY MOUTH EVERY 4 HOURS AS NEEDED FOR WHEEZING OR SHORTNESS OF BREATH. (Patient taking differently: Inhale 2 puffs into the lungs every 4 (four) hours as needed for wheezing or shortness of breath. )  No current facility-administered medications for this visit.  (Other)      REVIEW OF SYSTEMS: ROS    Positive for: Neurological, HENT, Endocrine, Cardiovascular, Eyes, Respiratory   Negative for: Constitutional, Gastrointestinal, Skin, Genitourinary, Musculoskeletal, Psychiatric, Allergic/Imm, Heme/Lymph   Last edited by Matthew Folks, COA on 03/19/2019  9:07 AM. (History)       ALLERGIES No Known Allergies  PAST MEDICAL HISTORY Past Medical History:  Diagnosis Date  . COPD (chronic obstructive pulmonary disease) ([place])   . Depression   . Diabetes mellitus without complication ([place])   . HTN (hypertension)   . Hyperlipidemia   . Hypertensive retinopathy     OU  . Retinal detachment    Rheg. RD OD  . Sleep apnea   . Stroke [facility])    Past Surgical History:  Procedure Laterality Date  . CATARACT EXTRACTION Bilateral    Dr. [name]  . CIRCUMCISION    . EYE SURGERY Bilateral    Cat Sx OU; RD repair OD  . GAS INSERTION Right 03/13/2019   Procedure: INSERTION OF GAS (C3F8) RIGHT EYE;  Surgeon: [name], MD;  Location: [facility];  Service: Ophthalmology;  Laterality: Right;  . LASER PHOTO ABLATION Right 03/13/2019   Procedure: LASER PHOTO  ABLATION RIGHT EYE;  Surgeon: [name], MD;  Location: [facility];  Service: Ophthalmology;  Laterality: Right;  . none    . PARS PLANA VITRECTOMY Right 03/13/2019   Procedure: 25 GAUGE PARS PLANA VITRECTOMY WITH  INTRAOCULAR LENSE EXPLANTATION RIGHT EYE.;  Surgeon: [name], MD;  Location: [facility];  Service: Ophthalmology;  Laterality: Right;  . PHOTOCOAGULATION WITH LASER Left 03/13/2019   Procedure: INDIRECT PHOTOCOAGULATION WITH LASER LEFT EYE;  Surgeon: [name], MD;  Location: [facility];  Service: Ophthalmology;  Laterality: Left;  . RETINAL DETACHMENT SURGERY Right 03/13/2019   PPV for repair of rheg. RD - Dr. [name]    FAMILY HISTORY Family History  Problem Relation Age of Onset  . Cancer Mother        brain tumor  . Heart disease Father   . Cirrhosis Father     SOCIAL HISTORY Social History   Tobacco Use  . Smoking status: Current Every Day Smoker    Packs/day: 1.00    Years: 43.00    Pack years: 43.00    Types: Cigarettes  . Smokeless tobacco: Never Used  . Tobacco comment: pt is trying to aggressively cut back on # cigs smoked daily.   Substance Use Topics  . Alcohol use: No  . Drug use: No         OPHTHALMIC EXAM:  Base Eye Exam    Visual Acuity (Snellen - Linear)      Right Left   Dist [place] CF @ 1' 20/20 -2   Dist ph [place] NI        Tonometry (Tonopen, 9:13 AM)      Right Left   Pressure 12 14       Pupils      Dark Light Shape React APD   Right 5 5 Round  Minimal None   Left 4 3 Round Slow None  Pharm dil OD       Visual Fields (Counting fingers)      Left Right    Full    Restrictions  Partial outer superior temporal, inferior temporal, superior nasal, inferior nasal deficiencies       Extraocular Movement      Right Left  Full, Ortho Full, Ortho       Neuro/Psych    Oriented x3: Yes   Mood/Affect: Normal       Dilation    Both eyes: 1.0% Mydriacyl, 2.5% Phenylephrine @ 9:13 AM        Slit Lamp and Fundus Exam    Slit Lamp Exam      Right Left   Lids/Lashes Dermatochalasis - upper lid, multiple papillomas, Telangiectasia, mild Meibomian gland dysfunction Dermatochalasis - upper lid, multiple papillomas, Telangiectasia, mild Meibomian gland dysfunction   Conjunctiva/Sclera Mild Subconjunctival hemorrhage, sutures intact, 3+ Injection Pinguecula, trace temporal Injection   Cornea Arcus, well healed temporal cataract wound, 3-4+ Punctate epithelial erosions, well healed superior cataract wound at 1200 with 10-0 nylon suture,  1+ Descemet's folds Arcus, Debris in tear film, 2+Punctate epithelial erosions   Anterior Chamber Deep, 2-3+ Cell/pigment, 40% gas bubble contiguous with posterior gas bubble Deep and quiet   Iris Round and dilated Round and dilated   Lens Aphakic Posterior chamber intraocular lens in good position   Vitreous post vitrectomy; good gas fill Vitreous syneresis       Fundus Exam      Right Left   Disc Pink and Sharp Pink and Sharp, mild temporal PPP   C/D Ratio 0.4 0.4   Macula Flat, Blunted foveal reflex, No heme or edema Flat, Blunted foveal reflex, No heme or edema   Vessels Vascular attenuation, very Tortuous, AV crossing changes Vascular attenuation, very Tortuous, AV crossing changes   Periphery Hazy view, grossly attached; early laser changes 360; ORIGINALLY: Bullous superior detachment from 1000-130 with multiple tears at 1030 and 1100 Attached, paving stone degeneration inferiorly, retinal hole  at 1200 and tears at 430 and 0730; good laser surrounding breaks at 1200, 0430 and 0730          IMAGING AND PROCEDURES  Imaging and Procedures for _0 @           ASSESSMENT/PLAN:    ICD-10-CM   1. Right retinal detachment  H33.21   2. Retinal edema  H35.81   3. Dislocation of intraocular lens, initial encounter  T85.22XA   4. Retinal breaks without detachment  H35.89   5. Essential hypertension  I10   6. Hypertensive retinopathy of both eyes  H35.033   7. Diabetes mellitus type 2 without retinopathy ([place])  E11.9   8. Pseudophakia of both eyes  Z96.1     1,2. Rhegmatogenous retinal detachment, right eye  - bullous superior mac off detachment, onset ~1 wk, with onset of foveal involvement Tuesday, 03/12/19 by pt history  - detached from 1000 to 0130, fovea off, multiple tears at 1030 and 1100  - now POD6 s/p PPV/IOL explanation/EL/FAX/14% C3F8 OD, 11.18.20             - doing well              - retina attached and in good position   - aphakia with gas bubble in AC             - IOP okay at 12             - cont   PF 6x/day OD                          zymaxid QID OD -- stop when bottle runs out  Atropine QD OD                          Brimonidine BID OD                          Cosopt BID OD                         PSO ung QID OD              - cont face down positioning 62mns/hr; avoid laying flat on back              - eye shield when sleeping              - post op drop and positioning instructions reviewed              - tylenol/ibuprofen for pain  - f/u 2 weeks  3. Dislocated IOL OD  - s/p CE/IOL OD on 10.21.20 w/ Dr. SGershon Crane - IOL displaced inferiorly with superior haptic hung up in capsular remnant   - poor dilation -- ?pseudoexfoliation, floppy iris OD  - developed macula/fovea involving RD as above  - s/p IOL explantation during RD repair 11.18.20 as above  - will plan for sutured [place] IOL once RD resolved and healed  - IOL  master measurements obtained 11.5.2020  4. Retinal breaks without detachment OS  - scleral depressed exam of OS completed as part of RD work up  - large retinal break at 12, smaller flap tears at 0430 amd 0730  - s/p laser retinopexy OS (11.18.20) -- good laser around breaks at 1200, 0430 and 0730  5,6. Hypertensive retinopathy OU  - discussed importance of tight BP control  - monitor  7. Diabetes mellitus, type 2 without retinopathy  - The incidence, risk factors for progression, natural history and treatment options for diabetic retinopathy  were discussed with patient.    - The need for close monitoring of blood glucose, blood pressure, and serum lipids, avoiding cigarette or any type of tobacco, and the need for long term follow up was also discussed with patient.  - f/u in 1 year, sooner prn  8. Pseudophakia OU  - s/p CE/IOL OU (Dr. SGershon Crane October 2020)  - subluxed IOL OD as above - s/p IOL explantation on 11.18.2020  - OS doing well  - post op drops OS per Dr. SGershon Crane - monitor   Ophthalmic Meds Ordered this visit:  Meds ordered this encounter  Medications  . prednisoLONE acetate (PRED FORTE) 1 % ophthalmic suspension    Sig: Place 1 drop into the right eye 6 (six) times daily.    Dispense:  15 mL    Refill:  0  . bacitracin-polymyxin b (POLYSPORIN) ophthalmic ointment    Sig: Place into the right eye 4 (four) times daily. Place a 1/2 inch ribbon of ointment into the lower eyelid.    Dispense:  3.5 g    Refill:  3       Return in about 2 weeks (around 04/02/2019) for f/u RD OD, DFE, OCT.  There are no Patient Instructions on file for this visit.   Explained the diagnoses, plan, and follow up with the patient and they expressed understanding.  Patient expressed understanding of the importance of proper follow up care.   This document serves as a record of services personally  performed by Gardiner Sleeper, MD, PhD. It was created on their behalf by Estill Bakes,  [place] an ophthalmic technician. The creation of this record is the provider's dictation and/or activities during the visit.    Electronically signed by: Estill Bakes, COT 03/14/19 @ 11:44 PM  Gardiner Sleeper, M.D., Ph.D. Diseases & Surgery of the Retina and [place] 03/19/2019   I have reviewed the above documentation for accuracy and completeness, and I agree with the above. Gardiner Sleeper, M.D., Ph.D. 03/19/19 11:44 PM    Abbreviations: M myopia (nearsighted); A astigmatism; H hyperopia (farsighted); P presbyopia; Mrx spectacle prescription;  CTL contact lenses; OD right eye; OS left eye; OU both eyes  XT exotropia; ET esotropia; PEK punctate epithelial keratitis; PEE punctate epithelial erosions; DES dry eye syndrome; MGD meibomian gland dysfunction; ATs artificial tears; PFAT's preservative free artificial tears; [place] nuclear sclerotic cataract; PSC posterior subcapsular cataract; ERM epi-retinal membrane; PVD posterior vitreous detachment; RD retinal detachment; DM diabetes mellitus; DR diabetic retinopathy; NPDR non-proliferative diabetic retinopathy; PDR proliferative diabetic retinopathy; CSME clinically significant macular edema; DME diabetic macular edema; dbh dot blot hemorrhages; CWS cotton wool spot; POAG primary open angle glaucoma; C/D cup-to-disc ratio; HVF humphrey visual field; GVF goldmann visual field; OCT optical coherence tomography; IOP intraocular pressure; BRVO Branch retinal vein occlusion; CRVO central retinal vein occlusion; CRAO central retinal artery occlusion; BRAO branch retinal artery occlusion; RT retinal tear; SB scleral buckle; PPV pars plana vitrectomy; VH Vitreous hemorrhage; PRP panretinal laser photocoagulation; IVK intravitreal kenalog; VMT vitreomacular traction; MH Macular hole;  NVD neovascularization of the disc; NVE neovascularization elsewhere; AREDS age related eye disease study; ARMD age related macular degeneration;  POAG primary open angle glaucoma; EBMD epithelial/anterior basement membrane dystrophy; ACIOL anterior chamber intraocular lens; IOL intraocular lens; PCIOL posterior chamber intraocular lens; Phaco/IOL phacoemulsification with intraocular lens placement; [place] photorefractive keratectomy; LASIK laser assisted in situ keratomileusis; HTN hypertension; DM diabetes mellitus; COPD chronic obstructive pulmonary disease

## 2019-03-15 ENCOUNTER — Other Ambulatory Visit: Payer: Self-pay | Admitting: Family Medicine

## 2019-03-15 DIAGNOSIS — I10 Essential (primary) hypertension: Secondary | ICD-10-CM

## 2019-03-18 ENCOUNTER — Encounter (HOSPITAL_COMMUNITY): Payer: Self-pay | Admitting: Ophthalmology

## 2019-03-19 ENCOUNTER — Encounter (INDEPENDENT_AMBULATORY_CARE_PROVIDER_SITE_OTHER): Payer: Self-pay | Admitting: Ophthalmology

## 2019-03-19 ENCOUNTER — Ambulatory Visit (INDEPENDENT_AMBULATORY_CARE_PROVIDER_SITE_OTHER): Payer: Medicare Other | Admitting: Ophthalmology

## 2019-03-19 DIAGNOSIS — I10 Essential (primary) hypertension: Secondary | ICD-10-CM

## 2019-03-19 DIAGNOSIS — H3321 Serous retinal detachment, right eye: Secondary | ICD-10-CM

## 2019-03-19 DIAGNOSIS — H35033 Hypertensive retinopathy, bilateral: Secondary | ICD-10-CM

## 2019-03-19 DIAGNOSIS — H3581 Retinal edema: Secondary | ICD-10-CM

## 2019-03-19 DIAGNOSIS — T8522XA Displacement of intraocular lens, initial encounter: Secondary | ICD-10-CM

## 2019-03-19 DIAGNOSIS — E119 Type 2 diabetes mellitus without complications: Secondary | ICD-10-CM

## 2019-03-19 DIAGNOSIS — H3589 Other specified retinal disorders: Secondary | ICD-10-CM

## 2019-03-19 DIAGNOSIS — Z961 Presence of intraocular lens: Secondary | ICD-10-CM

## 2019-03-19 MED ORDER — PREDNISOLONE ACETATE 1 % OP SUSP: 1.0000 [drp] | Freq: Every day | OPHTHALMIC | 0 refills | Status: DC

## 2019-03-19 MED ORDER — BACITRACIN-POLYMYXIN B 500-10000 UNIT/GM OP OINT: TOPICAL_OINTMENT | Freq: Four times a day (QID) | OPHTHALMIC | 3 refills | Status: DC

## 2019-03-20 ENCOUNTER — Other Ambulatory Visit: Payer: Self-pay

## 2019-03-20 ENCOUNTER — Encounter: Payer: Self-pay | Admitting: Family Medicine

## 2019-03-20 ENCOUNTER — Ambulatory Visit (INDEPENDENT_AMBULATORY_CARE_PROVIDER_SITE_OTHER): Payer: Medicare Other | Admitting: Family Medicine

## 2019-03-20 VITALS — BP 142/68 | HR 66 | Wt 197.8 lb

## 2019-03-20 DIAGNOSIS — Z23 Encounter for immunization: Secondary | ICD-10-CM

## 2019-03-20 DIAGNOSIS — Z1211 Encounter for screening for malignant neoplasm of colon: Secondary | ICD-10-CM | POA: Diagnosis not present

## 2019-03-20 DIAGNOSIS — E11319 Type 2 diabetes mellitus with unspecified diabetic retinopathy without macular edema: Secondary | ICD-10-CM

## 2019-03-20 LAB — POCT GLYCOSYLATED HEMOGLOBIN (HGB A1C): HbA1c, POC (controlled diabetic range): 6.5 % (ref 0.0–7.0)

## 2019-03-20 NOTE — Progress Notes (Signed)
    Subjective:  Jonathan [name] is a 68 y.o. male who presents to the [facility] today with a chief complaint of diabetes follow-up.   HPI: Type 2 diabetes mellitus ([place]) Patient well within goal with A1c of 6.5.  Patient has been consistent with his medication and feels he is doing well.  Colon cancer screening No symptoms, patient is overdue for colonoscopy because he did not want to get this done, he does agree to Cologuard however  Need for immunization against influenza Patient consents to flu shot  Objective:  Physical Exam: BP (!) 142/68   Pulse 66   Wt 197 lb 12.8 oz (89.7 kg)   SpO2 96%   BMI 31.93 kg/m   Gen: No distress, pleasant and good insight into his condition  CV: RRR with no murmurs appreciated Pulm: NWOB, CTAB with no crackles, wheezes, or rhonchi MSK: no edema, cyanosis, or clubbing noted Skin: warm, dry Neuro: grossly normal, moves all extremities Psych: Normal affect and thought content  No results found for this or any previous visit (from the past 72 hour(s)).   Assessment/Plan:  Type 2 diabetes mellitus ([place]) Patient well within goal with A1c of 6.5.  No change to medication plan at this time  Colon cancer screening No symptoms, patient is overdue for colonoscopy because he did not want to get this done, he does agree to Cologuard however  Cologuard ordered  Need for immunization against influenza Patient consents to flu shot   Sherene Sires, [place] - PGY3 03/23/2019 8:51 PM

## 2019-03-23 DIAGNOSIS — Z23 Encounter for immunization: Secondary | ICD-10-CM | POA: Insufficient documentation

## 2019-03-23 DIAGNOSIS — Z1211 Encounter for screening for malignant neoplasm of colon: Secondary | ICD-10-CM | POA: Insufficient documentation

## 2019-03-23 NOTE — Assessment & Plan Note (Signed)
Patient well within goal with A1c of 6.5.  No change to medication plan at this time

## 2019-03-23 NOTE — Assessment & Plan Note (Signed)
No symptoms, patient is overdue for colonoscopy because he did not want to get this done, he does agree to [employer] however  Cologuard ordered

## 2019-03-23 NOTE — Assessment & Plan Note (Signed)
Patient consents to flu shot 

## 2019-03-29 ENCOUNTER — Other Ambulatory Visit: Payer: Self-pay | Admitting: Family Medicine

## 2019-03-29 DIAGNOSIS — E119 Type 2 diabetes mellitus without complications: Secondary | ICD-10-CM

## 2019-03-29 MED ORDER — METFORMIN HCL 500 MG PO TABS: 500.0000 mg | ORAL_TABLET | Freq: Two times a day (BID) | ORAL | 3 refills | Status: DC

## 2019-03-29 NOTE — Progress Notes (Signed)
[facility] Note  04/02/2019     CHIEF COMPLAINT Patient presents for Retina Follow Up   HISTORY OF PRESENT ILLNESS: Jonathan [name] is a 68 y.o. male who presents to the clinic today for:   HPI    Retina Follow Up    Patient presents with  Retinal Break/Detachment.  In right eye.  This started 2 weeks ago.  Severity is moderate.  I, the attending physician,  performed the HPI with the patient and updated documentation appropriately.          Comments    Patient here for 2 weeks retina follow up for RD OD.Patient states vision not too good in OD. Can see looking down but not straight ahead. Has some eye pain, it is not bad. OD real sensitive to bright light.        Last edited by Jonathan Caffey, MD on 04/02/2019  3:11 PM. (History)    pt states he is doing well, he is maintaining face down position, he states he can see a line in his vision from the gas bubble  Referring physician: [name], MD [place], [place] [id number]   HISTORICAL INFORMATION:   Selected notes from the MEDICAL RECORD NUMBER Referred by Dr. Gershon [name] for evaluation of dislocated IOL   CURRENT MEDICATIONS: Current Outpatient Medications (Ophthalmic Drugs)  Medication Sig  . bacitracin-polymyxin b (POLYSPORIN) ophthalmic ointment Place into the right eye 4 (four) times daily. Place a 1/2 inch ribbon of ointment into the lower eyelid.  Marland Kitchen brimonidine (ALPHAGAN) 0.2 % ophthalmic solution Place 1 drop into the right eye 2 (two) times daily.  . prednisoLONE acetate (PRED FORTE) 1 % ophthalmic suspension Place 1 drop into the right eye 6 (six) times daily.   No current facility-administered medications for this visit. (Ophthalmic Drugs)   Current Outpatient Medications (Other)  Medication Sig  . amLODipine (NORVASC) 5 MG tablet Take 1 tablet (5 mg total) by mouth daily.  Marland Kitchen atorvastatin (LIPITOR) 40 MG tablet Take 1 tablet (40 mg total) by mouth daily.  . Blood Glucose  Monitoring Suppl (ONETOUCH VERIO) w/Device KIT Check blood sugar once per day  . buPROPion (WELLBUTRIN SR) 150 MG 12 hr tablet Take 1 tablet (150 mg total) by mouth 2 (two) times daily.  . clopidogrel (PLAVIX) 75 MG tablet Take 1 tablet (75 mg total) by mouth daily.  . fexofenadine (ALLEGRA ALLERGY) 180 MG tablet Take 1 tablet (180 mg total) by mouth daily.  . fluticasone (FLONASE) 50 MCG/ACT nasal spray Place 1 spray into both nostrils daily.  Marland Kitchen glucose blood (ONETOUCH VERIO) test strip Check sugar once per day  . Lancet Devices (ONE TOUCH DELICA LANCING DEV) MISC Check sugar once per day  . lisinopril (PRINIVIL,ZESTRIL) 20 MG tablet Take 1 tablet (20 mg total) by mouth daily.  . metFORMIN (GLUCOPHAGE) 500 MG tablet Take 1 tablet (500 mg total) by mouth 2 (two) times daily with a meal.  . OneTouch Delica Lancets 78L MISC Check sugar once per day  . VENTOLIN HFA 108 (90 Base) MCG/ACT inhaler INHALE 2 PUFFS BY MOUTH EVERY 4 HOURS AS NEEDED FOR WHEEZING OR SHORTNESS OF BREATH. (Patient taking differently: Inhale 2 puffs into the lungs every 4 (four) hours as needed for wheezing or shortness of breath. )   No current facility-administered medications for this visit. (Other)      REVIEW OF SYSTEMS: ROS    Positive for: Neurological, HENT, Endocrine, Cardiovascular,  Eyes, Respiratory   Negative for: Constitutional, Gastrointestinal, Skin, Genitourinary, Musculoskeletal, Psychiatric, Allergic/Imm, Heme/Lymph   Last edited by Jonathan [name], COA on 04/02/2019  1:53 PM. (History)       ALLERGIES No Known Allergies  PAST MEDICAL HISTORY Past Medical History:  Diagnosis Date  . COPD (chronic obstructive pulmonary disease) ([place])   . Depression   . Diabetes mellitus without complication ([place])   . HTN (hypertension)   . Hyperlipidemia   . Hypertensive retinopathy    OU  . Retinal detachment    Rheg. RD OD  . Sleep apnea   . Stroke [facility])    Past Surgical History:  Procedure  Laterality Date  . CATARACT EXTRACTION Bilateral    Dr. Gershon [name]  . CIRCUMCISION    . EYE SURGERY Bilateral    Cat Sx OU; RD repair OD  . GAS INSERTION Right 03/13/2019   Procedure: INSERTION OF GAS (C3F8) RIGHT EYE;  Surgeon: Jonathan Caffey, MD;  Location: [place];  Service: Ophthalmology;  Laterality: Right;  . LASER PHOTO ABLATION Right 03/13/2019   Procedure: LASER PHOTO  ABLATION RIGHT EYE;  Surgeon: Jonathan Caffey, MD;  Location: [place];  Service: Ophthalmology;  Laterality: Right;  . none    . PARS PLANA VITRECTOMY Right 03/13/2019   Procedure: 25 GAUGE PARS PLANA VITRECTOMY WITH  INTRAOCULAR LENSE EXPLANTATION RIGHT EYE.;  Surgeon: Jonathan Caffey, MD;  Location: [place];  Service: Ophthalmology;  Laterality: Right;  . PHOTOCOAGULATION WITH LASER Left 03/13/2019   Procedure: INDIRECT PHOTOCOAGULATION WITH LASER LEFT EYE;  Surgeon: Jonathan Caffey, MD;  Location: [place];  Service: Ophthalmology;  Laterality: Left;  . RETINAL DETACHMENT SURGERY Right 03/13/2019   PPV for repair of rheg. RD - Dr. Bernarda [name]    FAMILY HISTORY Family History  Problem Relation Age of Onset  . Cancer Mother        brain tumor  . Heart disease Father   . Cirrhosis Father     SOCIAL HISTORY Social History   Tobacco Use  . Smoking status: Current Every Day Smoker    Packs/day: 1.00    Years: 43.00    Pack years: 43.00    Types: Cigarettes  . Smokeless tobacco: Never Used  . Tobacco comment: pt is trying to aggressively cut back on # cigs smoked daily.   Substance Use Topics  . Alcohol use: No  . Drug use: No         OPHTHALMIC EXAM:  Base Eye Exam    Visual Acuity (Snellen - Linear)      Right Left   Dist [place] CF at 1' 20/20   Dist ph [place] NI   Vision OD when looks down can see finger. Not straight or looking to side.       Tonometry (Tonopen, 1:47 PM)      Right Left   Pressure 16 13       Pupils      Dark Light Shape React APD   Right 5 5 Round Minimal None   Left 4 3 Round Minimal  None       Visual Fields (Counting fingers)      Left Right    Full    Restrictions  Partial outer superior temporal, inferior temporal, superior nasal, inferior nasal deficiencies       Extraocular Movement      Right Left    Full, Ortho Full, Ortho       Neuro/Psych    Oriented x3: Yes  Mood/Affect: Normal       Dilation    Both eyes: 1.0% Mydriacyl, 2.5% Phenylephrine @ 1:47 PM        Slit Lamp and Fundus Exam    Slit Lamp Exam      Right Left   Lids/Lashes Dermatochalasis - upper lid, multiple papillomas, Telangiectasia, mild Meibomian gland dysfunction Dermatochalasis - upper lid, multiple papillomas, Telangiectasia, mild Meibomian gland dysfunction   Conjunctiva/Sclera sutures dissolving, trace Injection Pinguecula, trace temporal Injection   Cornea Arcus, well healed temporal cataract wound, 3-4+ Punctate epithelial erosions, well healed superior cataract wound at 1200 with 10-0 nylon suture,  1+ Descemet's folds Arcus, Debris in tear film, 2+Punctate epithelial erosions   Anterior Chamber Deep, no cell or flare, 40% gas bubble contiguous with posterior gas bubble Deep and quiet   Iris Round and dilated Round and dilated   Lens Aphakic Posterior chamber intraocular lens in good position   Vitreous post vitrectomy; 60% gas bubble  Vitreous syneresis       Fundus Exam      Right Left   Disc Pink and Sharp Pink and Sharp, mild temporal PPP   C/D Ratio 0.4 0.4   Macula Hazy view, Flat, Blunted foveal reflex, No heme or edema Flat, Blunted foveal reflex, No heme or edema   Vessels Vascular attenuation, very Tortuous, AV crossing changes Vascular attenuation, very Tortuous, AV crossing changes   Periphery Hazy view, grossly attached; good laser changes 360; ORIGINALLY: Bullous superior detachment from 1000-130 with multiple tears at 1030 and 1100 Attached, paving stone degeneration inferiorly, retinal hole at 1200 and tears at 430 and 0730; good laser surrounding breaks at  1200, 0430 and 0730          IMAGING AND PROCEDURES  Imaging and Procedures for _0 @  OCT, Retina - OU - Both Eyes       Right Eye Findings include abnormal foveal contour, intraretinal fluid, subretinal fluid.   Left Eye Quality was good. Central Foveal Thickness: 321. Progression has been stable. Findings include normal foveal contour, no IRF, no SRF, epiretinal membrane, macular pucker.   Notes *Images captured and stored on drive  Diagnosis / Impression:  OD: no view OS: NFP, no IRF/SRF  Clinical management:  See below  Abbreviations: NFP - Normal foveal profile. CME - cystoid macular edema. PED - pigment epithelial detachment. IRF - intraretinal fluid. SRF - subretinal fluid. EZ - ellipsoid zone. ERM - epiretinal membrane. ORA - outer retinal atrophy. ORT - outer retinal tubulation. SRHM - subretinal hyper-reflective material                 ASSESSMENT/PLAN:    ICD-10-CM   1. Right retinal detachment  H33.21   2. Retinal edema  H35.81 OCT, Retina - OU - Both Eyes  3. Dislocation of intraocular lens, initial encounter  T85.22XA   4. Retinal breaks without detachment  H35.89   5. Essential hypertension  I10   6. Hypertensive retinopathy of both eyes  H35.033   7. Diabetes mellitus type 2 without retinopathy ([place])  E11.9   8. Pseudophakia of both eyes  Z96.1     1,2. Rhegmatogenous retinal detachment, right eye  - bullous superior mac off detachment, onset ~1 wk, with onset of foveal involvement Tuesday, 03/12/19 by pt history  - detached from 1000 to 0130, fovea off, multiple tears at 1030 and 1100  - s/p PPV/IOL explanation/EL/FAX/14% C3F8 OD, 11.18.20             -  doing well              - retina attached and in good position   - aphakia with gas bubble in AC             - IOP okay at 16  - gas bubble at 60%             - dec PF QID OD  - cont Atropine QD OD                          Brimonidine BID OD                          Cosopt BID OD                          PSO ung QID OD              - cont face down positioning 57mns/hr; avoid laying flat on back              - eye shield when sleeping              - post op drop and positioning instructions reviewed              - tylenol/ibuprofen for pain  - f/u 3 weeks  3. Dislocated IOL OD  - s/p CE/IOL OD on 10.21.20 w/ Dr. SGershon [name] - IOL displaced inferiorly with superior haptic hung up in capsular remnant   - poor dilation -- ?pseudoexfoliation, floppy iris OD  - developed macula/fovea involving RD as above  - s/p IOL explantation during RD repair 11.18.20 as above  - will plan for sutured [place] IOL once RD repair stable  - IOL master measurements obtained 11.5.2020  4. Retinal breaks without detachment OS  - scleral depressed exam of OS completed as part of RD work up  - large retinal break at 12, smaller flap tears at 0430 amd 0730  - s/p laser retinopexy OS (11.18.20) -- good laser around breaks at 1200, 0430 and 0730  5,6. Hypertensive retinopathy OU  - discussed importance of tight BP control  - monitor  7. Diabetes mellitus, type 2 without retinopathy  - The incidence, risk factors for progression, natural history and treatment options for diabetic retinopathy  were discussed with patient.    - The need for close monitoring of blood glucose, blood pressure, and serum lipids, avoiding cigarette or any type of tobacco, and the need for long term follow up was also discussed with patient.  - f/u in 1 year, sooner prn  8. Pseudophakia OU  - s/p CE/IOL OU (Dr. SGershon [name] October 2020)  - subluxed IOL OD as above - s/p IOL explantation on 11.18.2020  - OS doing well  - post op drops OS per Dr. SGershon [name] - monitor   Ophthalmic Meds Ordered this visit:  Meds ordered this encounter  Medications  . brimonidine (ALPHAGAN) 0.2 % ophthalmic solution    Sig: Place 1 drop into the right eye 2 (two) times daily.    Dispense:  10 mL    Refill:  1       Return in about  3 weeks (around 04/23/2019) for f/u RD OD, DFE, OCT.  There are no Patient Instructions on file for this visit.   Explained the diagnoses, plan, and follow up with the  patient and they expressed understanding.  Patient expressed understanding of the importance of proper follow up care.   This document serves as a record of services personally performed by Gardiner Sleeper, MD, PhD. It was created on their behalf by Estill Bakes, COT an ophthalmic technician. The creation of this record is the provider's dictation and/or activities during the visit.    Electronically signed by: Estill Bakes, COT 03/29/19 @ 11:17 PM   This document serves as a record of services personally performed by Gardiner Sleeper, MD, PhD. It was created on their behalf by Ernest Mallick, OA, an ophthalmic assistant. The creation of this record is the provider's dictation and/or activities during the visit.    Electronically signed by: Ernest Mallick, OA 12.08.2020 11:17 PM   Gardiner Sleeper, M.D., Ph.D. Diseases & Surgery of the Retina and [place] 04/02/2019    I have reviewed the above documentation for accuracy and completeness, and I agree with the above. Gardiner Sleeper, M.D., Ph.D. 04/04/19 11:17 PM    Abbreviations: M myopia (nearsighted); A astigmatism; H hyperopia (farsighted); P presbyopia; Mrx spectacle prescription;  CTL contact lenses; OD right eye; OS left eye; OU both eyes  XT exotropia; ET esotropia; PEK punctate epithelial keratitis; PEE punctate epithelial erosions; DES dry eye syndrome; MGD meibomian gland dysfunction; ATs artificial tears; PFAT's preservative free artificial tears; [place] nuclear sclerotic cataract; PSC posterior subcapsular cataract; ERM epi-retinal membrane; PVD posterior vitreous detachment; RD retinal detachment; DM diabetes mellitus; DR diabetic retinopathy; NPDR non-proliferative diabetic retinopathy; PDR proliferative diabetic retinopathy; CSME  clinically significant macular edema; DME diabetic macular edema; dbh dot blot hemorrhages; CWS cotton wool spot; POAG primary open angle glaucoma; C/D cup-to-disc ratio; HVF humphrey visual field; GVF goldmann visual field; OCT optical coherence tomography; IOP intraocular pressure; BRVO Branch retinal vein occlusion; CRVO central retinal vein occlusion; CRAO central retinal artery occlusion; BRAO branch retinal artery occlusion; RT retinal tear; SB scleral buckle; PPV pars plana vitrectomy; VH Vitreous hemorrhage; PRP panretinal laser photocoagulation; IVK intravitreal kenalog; VMT vitreomacular traction; MH Macular hole;  NVD neovascularization of the disc; NVE neovascularization elsewhere; AREDS age related eye disease study; ARMD age related macular degeneration; POAG primary open angle glaucoma; EBMD epithelial/anterior basement membrane dystrophy; ACIOL anterior chamber intraocular lens; IOL intraocular lens; PCIOL posterior chamber intraocular lens; Phaco/IOL phacoemulsification with intraocular lens placement; [place] photorefractive keratectomy; LASIK laser assisted in situ keratomileusis; HTN hypertension; DM diabetes mellitus; COPD chronic obstructive pulmonary disease

## 2019-04-02 ENCOUNTER — Ambulatory Visit (INDEPENDENT_AMBULATORY_CARE_PROVIDER_SITE_OTHER): Payer: Medicare Other | Admitting: Ophthalmology

## 2019-04-02 ENCOUNTER — Encounter (INDEPENDENT_AMBULATORY_CARE_PROVIDER_SITE_OTHER): Payer: Self-pay | Admitting: Ophthalmology

## 2019-04-02 DIAGNOSIS — E119 Type 2 diabetes mellitus without complications: Secondary | ICD-10-CM

## 2019-04-02 DIAGNOSIS — H3589 Other specified retinal disorders: Secondary | ICD-10-CM

## 2019-04-02 DIAGNOSIS — H3321 Serous retinal detachment, right eye: Secondary | ICD-10-CM

## 2019-04-02 DIAGNOSIS — I10 Essential (primary) hypertension: Secondary | ICD-10-CM

## 2019-04-02 DIAGNOSIS — H3581 Retinal edema: Secondary | ICD-10-CM | POA: Diagnosis not present

## 2019-04-02 DIAGNOSIS — Z961 Presence of intraocular lens: Secondary | ICD-10-CM

## 2019-04-02 DIAGNOSIS — T8522XA Displacement of intraocular lens, initial encounter: Secondary | ICD-10-CM

## 2019-04-02 DIAGNOSIS — H35033 Hypertensive retinopathy, bilateral: Secondary | ICD-10-CM

## 2019-04-02 MED ORDER — BRIMONIDINE TARTRATE 0.2 % OP SOLN: 1.0000 [drp] | Freq: Two times a day (BID) | OPHTHALMIC | 1 refills | Status: DC

## 2019-04-04 ENCOUNTER — Encounter (INDEPENDENT_AMBULATORY_CARE_PROVIDER_SITE_OTHER): Payer: Self-pay | Admitting: Ophthalmology

## 2019-04-29 NOTE — Progress Notes (Signed)
[facility] Note  05/02/2019     CHIEF COMPLAINT Patient presents for Retina Follow Up   HISTORY OF PRESENT ILLNESS: Jonathan [name] is a 69 y.o. male who presents to the clinic today for:   HPI    Retina Follow Up    Patient presents with  Retinal Break/Detachment.  In right eye.  This started 3 weeks ago.  Severity is moderate.  I, the attending physician,  performed the HPI with the patient and updated documentation appropriately.          Comments    Patient here for 3 weeks retina follow up for POV RD OD. Patient states vision getting better. OD healing everything still blurred. No eye pain.        Last edited by [name], MD on 05/02/2019  4:08 PM. (History)    pt states he can still see the gas bubble, but it's small and at the bottom of his vision, he states he is surprised at how good his vision was today  Referring physician: [name], MD [place], [place] [address]   HISTORICAL INFORMATION:   Selected notes from the MEDICAL RECORD NUMBER Referred by Dr. [name] for evaluation of dislocated IOL   CURRENT MEDICATIONS: Current Outpatient Medications (Ophthalmic Drugs)  Medication Sig  . bacitracin-polymyxin b (POLYSPORIN) ophthalmic ointment Place into the right eye 4 (four) times daily. Place a 1/2 inch ribbon of ointment into the lower eyelid.  Marland Kitchen brimonidine (ALPHAGAN) 0.2 % ophthalmic solution Place 1 drop into the right eye 2 (two) times daily.  . prednisoLONE acetate (PRED FORTE) 1 % ophthalmic suspension Place 1 drop into the right eye 6 (six) times daily.   No current facility-administered medications for this visit. (Ophthalmic Drugs)   Current Outpatient Medications (Other)  Medication Sig  . amLODipine (NORVASC) 5 MG tablet Take 1 tablet (5 mg total) by mouth daily.  Marland Kitchen atorvastatin (LIPITOR) 40 MG tablet Take 1 tablet (40 mg total) by mouth daily.  . Blood Glucose Monitoring Suppl (ONETOUCH VERIO) w/Device  KIT Check blood sugar once per day  . buPROPion (WELLBUTRIN SR) 150 MG 12 hr tablet Take 1 tablet (150 mg total) by mouth 2 (two) times daily.  . clopidogrel (PLAVIX) 75 MG tablet Take 1 tablet (75 mg total) by mouth daily.  . fexofenadine (ALLEGRA ALLERGY) 180 MG tablet Take 1 tablet (180 mg total) by mouth daily.  . fluticasone (FLONASE) 50 MCG/ACT nasal spray Place 1 spray into both nostrils daily.  Marland Kitchen glucose blood (ONETOUCH VERIO) test strip Check sugar once per day  . Lancet Devices (ONE TOUCH DELICA LANCING DEV) MISC Check sugar once per day  . lisinopril (PRINIVIL,ZESTRIL) 20 MG tablet Take 1 tablet (20 mg total) by mouth daily.  . metFORMIN (GLUCOPHAGE) 500 MG tablet Take 1 tablet (500 mg total) by mouth 2 (two) times daily with a meal.  . OneTouch Delica Lancets 88F MISC Check sugar once per day  . VENTOLIN HFA 108 (90 Base) MCG/ACT inhaler INHALE 2 PUFFS BY MOUTH EVERY 4 HOURS AS NEEDED FOR WHEEZING OR SHORTNESS OF BREATH. (Patient taking differently: Inhale 2 puffs into the lungs every 4 (four) hours as needed for wheezing or shortness of breath. )   No current facility-administered medications for this visit. (Other)      REVIEW OF SYSTEMS: ROS    Positive for: Neurological, HENT, Endocrine, Cardiovascular, Eyes, Respiratory   Negative for: Constitutional, Gastrointestinal, Skin, Genitourinary,  Musculoskeletal, Psychiatric, Allergic/Imm, Heme/Lymph   Last edited by [name], COA on 05/02/2019  2:48 PM. (History)       ALLERGIES No Known Allergies  PAST MEDICAL HISTORY Past Medical History:  Diagnosis Date  . COPD (chronic obstructive pulmonary disease) ([place])   . Depression   . Diabetes mellitus without complication ([place])   . HTN (hypertension)   . Hyperlipidemia   . Hypertensive retinopathy    OU  . Retinal detachment    Rheg. RD OD  . Sleep apnea   . Stroke [facility])    Past Surgical History:  Procedure Laterality Date  . CATARACT EXTRACTION Bilateral     Dr. [name]  . CIRCUMCISION    . EYE SURGERY Bilateral    Cat Sx OU; RD repair OD  . GAS INSERTION Right 03/13/2019   Procedure: INSERTION OF GAS (C3F8) RIGHT EYE;  Surgeon: [name], MD;  Location: [place];  Service: Ophthalmology;  Laterality: Right;  . LASER PHOTO ABLATION Right 03/13/2019   Procedure: LASER PHOTO  ABLATION RIGHT EYE;  Surgeon: [name], MD;  Location: [place];  Service: Ophthalmology;  Laterality: Right;  . none    . PARS PLANA VITRECTOMY Right 03/13/2019   Procedure: 25 GAUGE PARS PLANA VITRECTOMY WITH  INTRAOCULAR LENSE EXPLANTATION RIGHT EYE.;  Surgeon: [name], MD;  Location: [place];  Service: Ophthalmology;  Laterality: Right;  . PHOTOCOAGULATION WITH LASER Left 03/13/2019   Procedure: INDIRECT PHOTOCOAGULATION WITH LASER LEFT EYE;  Surgeon: [name], MD;  Location: [place];  Service: Ophthalmology;  Laterality: Left;  . RETINAL DETACHMENT SURGERY Right 03/13/2019   PPV for repair of rheg. RD - Dr. [name]    FAMILY HISTORY Family History  Problem Relation Age of Onset  . Cancer Mother        brain tumor  . Heart disease Father   . Cirrhosis Father     SOCIAL HISTORY Social History   Tobacco Use  . Smoking status: Current Every Day Smoker    Packs/day: 1.00    Years: 43.00    Pack years: 43.00    Types: Cigarettes  . Smokeless tobacco: Never Used  . Tobacco comment: pt is trying to aggressively cut back on # cigs smoked daily.   Substance Use Topics  . Alcohol use: No  . Drug use: No         OPHTHALMIC EXAM:  Base Eye Exam    Visual Acuity (Snellen - Linear)      Right Left   Dist [place] CF at 3' 20/20 -1   Dist ph  20/150 -2        Tonometry (Tonopen, 2:45 PM)      Right Left   Pressure 20 12       Pupils      Dark Light Shape React APD   Right 5 5 Round Minimal None   Left 4 3 Round Minimal None       Visual Fields (Counting fingers)      Left Right    Full    Restrictions  Partial outer superior  temporal, inferior temporal, superior nasal, inferior nasal deficiencies       Extraocular Movement      Right Left    Full, Ortho Full, Ortho       Neuro/Psych    Oriented x3: Yes   Mood/Affect: Normal       Dilation    Both eyes: 1.0% Mydriacyl, 2.5% Phenylephrine @ 2:45 PM  Slit Lamp and Fundus Exam    Slit Lamp Exam      Right Left   Lids/Lashes Dermatochalasis - upper lid, multiple papillomas, Telangiectasia, mild Meibomian gland dysfunction Dermatochalasis - upper lid, multiple papillomas, Telangiectasia, mild Meibomian gland dysfunction   Conjunctiva/Sclera sutures dissolving, trace Injection Pinguecula, trace temporal Injection   Cornea Arcus, well healed temporal cataract wound, trace + Punctate epithelial erosions, well healed superior cataract wound at 1200 with 10-0 nylon suture Arcus, Debris in tear film, 2+Punctate epithelial erosions   Anterior Chamber Deep and clear, 5% gas bubble superiorly Deep and quiet   Iris Round and dilated Round and dilated   Lens Aphakic Posterior chamber intraocular lens in good position   Vitreous post vitrectomy; trace pigment, 20-25% gas bubble  Vitreous syneresis       Fundus Exam      Right Left   Disc Pink and Sharp, focal PPP at 0800 Pink and Sharp, mild temporal PPP   C/D Ratio 0.4 0.4   Macula Attached, Blunted foveal reflex, Retinal pigment epithelial mottling Flat, Blunted foveal reflex, No heme or edema   Vessels Vascular attenuation, Tortuous Vascular attenuation, very Tortuous, AV crossing changes   Periphery attached; good laser changes 360; ORIGINALLY: Bullous superior detachment from 1000-130 with multiple tears at 1030 and 1100 Attached, paving stone degeneration inferiorly, retinal hole at 1200 and tears at 430 and 0730; good laser surrounding breaks at 1200, 0430 and 0730          IMAGING AND PROCEDURES  Imaging and Procedures for _0 @  OCT, Retina - OU - Both Eyes       Right Eye Quality was  good. Central Foveal Thickness: 283. Progression has improved. Findings include normal foveal contour, no IRF, no SRF (Mild decrease in ellipsoid signal centrally).   Left Eye Quality was good. Central Foveal Thickness: 316. Progression has been stable. Findings include normal foveal contour, no IRF, no SRF, epiretinal membrane, macular pucker.   Notes *Images captured and stored on drive  Diagnosis / Impression:  OD: retina reattached; NFP, no IRF/SRF OS: NFP, no IRF/SRF; +ERM w/ pucker  Clinical management:  See below  Abbreviations: NFP - Normal foveal profile. CME - cystoid macular edema. PED - pigment epithelial detachment. IRF - intraretinal fluid. SRF - subretinal fluid. EZ - ellipsoid zone. ERM - epiretinal membrane. ORA - outer retinal atrophy. ORT - outer retinal tubulation. SRHM - subretinal hyper-reflective material                 ASSESSMENT/PLAN:    ICD-10-CM   1. Right retinal detachment  H33.21   2. Retinal edema  H35.81 OCT, Retina - OU - Both Eyes  3. Dislocation of intraocular lens, initial encounter  T85.22XA   4. Retinal breaks without detachment  H35.89   5. Essential hypertension  I10   6. Hypertensive retinopathy of both eyes  H35.033   7. Diabetes mellitus type 2 without retinopathy ([place])  E11.9   8. Pseudophakia of both eyes  Z96.1     1,2. Rhegmatogenous retinal detachment, right eye  - bullous superior mac off detachment, onset ~1 wk, with onset of foveal involvement Tuesday, 03/12/19 by pt history  - detached from 1000 to 0130, fovea off, multiple tears at 1030 and 1100  - s/p PPV/IOL explanation/EL/FAX/14% C3F8 OD, 11.18.20             - doing well w/ improving vision             -  retina attached and in good position   - aphakia with gas bubble in [facility] -- now just a small 5% gas bubble in AC             - IOP okay at 20  - posterior gas bubble at 20-25%             - dec PF to BID OD  - cont Atropine QD OD                           Brimonidine BID OD                          Cosopt BID OD                         PSO ung QID OD -- okay to stop             - avoid laying flat on back              - d/ceye shield when sleeping              - post op drop and positioning instructions reviewed   - f/u 3-4 weeks  3. Dislocated IOL OD  - s/p CE/IOL OD on 10.21.20 w/ Dr. [name]  - IOL displaced inferiorly with superior haptic hung up in capsular remnant   - poor dilation -- ?pseudoexfoliation, floppy iris OD  - developed macula/fovea involving RD as above  - s/p IOL explantation during RD repair 11.18.20 as above  - will plan for sutured [place] IOL once RD repair stable  - IOL master measurements obtained 11.5.2020  - will repeat at next visit  4. Retinal breaks without detachment OS  - scleral depressed exam of OS completed as part of RD work up  - large retinal break at 12, smaller flap tears at 0430 amd 0730  - s/p laser retinopexy OS (11.18.20) -- good laser around breaks at 1200, 0430 and 0730  5,6. Hypertensive retinopathy OU  - discussed importance of tight BP control  - monitor  7. Diabetes mellitus, type 2 without retinopathy  - The incidence, risk factors for progression, natural history and treatment options for diabetic retinopathy  were discussed with patient.    - The need for close monitoring of blood glucose, blood pressure, and serum lipids, avoiding cigarette or any type of tobacco, and the need for long term follow up was also discussed with patient.  - f/u in 1 year, sooner prn  8. Pseudophakia OU  - s/p CE/IOL OU (Dr. [name], October 2020)  - subluxed IOL OD as above - s/p IOL explantation on 11.18.2020  - OS doing well  - post op drops OS per Dr. [name]  - monitor   Ophthalmic Meds Ordered this visit:  No orders of the defined types were placed in this encounter.      Return for f/u 3-4 weeks, RD OD, DFE, OCT.  There are no Patient Instructions on file for this  visit.   Explained the diagnoses, plan, and follow up with the patient and they expressed understanding.  Patient expressed understanding of the importance of proper follow up care.   This document serves as a record of services personally performed by Gardiner Sleeper, MD, PhD. It was created on their behalf by Leeann Must, [place], a certified ophthalmic assistant. The creation  of this record is the provider's dictation and/or activities during the visit.    Electronically signed by: Leeann Must, COA _0 @ 3:51 PM  This document serves as a record of services personally performed by Gardiner Sleeper, MD, PhD. It was created on their behalf by Ernest Mallick, OA, an ophthalmic assistant. The creation of this record is the provider's dictation and/or activities during the visit.    Electronically signed by: Ernest Mallick, OA 01.07.2021 3:51 PM   Gardiner Sleeper, M.D., Ph.D. Diseases & Surgery of the Retina and Vitreous [facility]  I have reviewed the above documentation for accuracy and completeness, and I agree with the above. Gardiner Sleeper, M.D., Ph.D. 05/03/19 3:51 PM    Abbreviations: M myopia (nearsighted); A astigmatism; H hyperopia (farsighted); P presbyopia; Mrx spectacle prescription;  CTL contact lenses; OD right eye; OS left eye; OU both eyes  XT exotropia; ET esotropia; PEK punctate epithelial keratitis; PEE punctate epithelial erosions; DES dry eye syndrome; MGD meibomian gland dysfunction; ATs artificial tears; PFAT's preservative free artificial tears; [place] nuclear sclerotic cataract; PSC posterior subcapsular cataract; ERM epi-retinal membrane; PVD posterior vitreous detachment; RD retinal detachment; DM diabetes mellitus; DR diabetic retinopathy; NPDR non-proliferative diabetic retinopathy; PDR proliferative diabetic retinopathy; CSME clinically significant macular edema; DME diabetic macular edema; dbh dot blot hemorrhages; CWS cotton wool spot; POAG  primary open angle glaucoma; C/D cup-to-disc ratio; HVF humphrey visual field; GVF goldmann visual field; OCT optical coherence tomography; IOP intraocular pressure; BRVO Branch retinal vein occlusion; CRVO central retinal vein occlusion; CRAO central retinal artery occlusion; BRAO branch retinal artery occlusion; RT retinal tear; SB scleral buckle; PPV pars plana vitrectomy; VH Vitreous hemorrhage; PRP panretinal laser photocoagulation; IVK intravitreal kenalog; VMT vitreomacular traction; MH Macular hole;  NVD neovascularization of the disc; NVE neovascularization elsewhere; AREDS age related eye disease study; ARMD age related macular degeneration; POAG primary open angle glaucoma; EBMD epithelial/anterior basement membrane dystrophy; ACIOL anterior chamber intraocular lens; IOL intraocular lens; PCIOL posterior chamber intraocular lens; Phaco/IOL phacoemulsification with intraocular lens placement; [place] photorefractive keratectomy; LASIK laser assisted in situ keratomileusis; HTN hypertension; DM diabetes mellitus; COPD chronic obstructive pulmonary disease

## 2019-04-30 ENCOUNTER — Encounter (INDEPENDENT_AMBULATORY_CARE_PROVIDER_SITE_OTHER): Payer: Medicare Other | Admitting: Ophthalmology

## 2019-05-02 ENCOUNTER — Encounter (INDEPENDENT_AMBULATORY_CARE_PROVIDER_SITE_OTHER): Payer: Self-pay | Admitting: Ophthalmology

## 2019-05-02 ENCOUNTER — Ambulatory Visit (INDEPENDENT_AMBULATORY_CARE_PROVIDER_SITE_OTHER): Payer: Medicare Other | Admitting: Ophthalmology

## 2019-05-02 DIAGNOSIS — H3581 Retinal edema: Secondary | ICD-10-CM

## 2019-05-02 DIAGNOSIS — Z961 Presence of intraocular lens: Secondary | ICD-10-CM

## 2019-05-02 DIAGNOSIS — T8522XA Displacement of intraocular lens, initial encounter: Secondary | ICD-10-CM

## 2019-05-02 DIAGNOSIS — E119 Type 2 diabetes mellitus without complications: Secondary | ICD-10-CM

## 2019-05-02 DIAGNOSIS — H3589 Other specified retinal disorders: Secondary | ICD-10-CM

## 2019-05-02 DIAGNOSIS — H35033 Hypertensive retinopathy, bilateral: Secondary | ICD-10-CM

## 2019-05-02 DIAGNOSIS — I10 Essential (primary) hypertension: Secondary | ICD-10-CM

## 2019-05-02 DIAGNOSIS — H3321 Serous retinal detachment, right eye: Secondary | ICD-10-CM

## 2019-05-30 ENCOUNTER — Encounter (INDEPENDENT_AMBULATORY_CARE_PROVIDER_SITE_OTHER): Payer: Medicare Other | Admitting: Ophthalmology

## 2019-05-30 NOTE — Progress Notes (Signed)
[facility] Note  06/03/2019     CHIEF COMPLAINT Patient presents for Retina Follow Up   HISTORY OF PRESENT ILLNESS: Jonathan [name] is a 69 y.o. male who presents to the clinic today for:   HPI    Retina Follow Up    Patient presents with  Retinal Break/Detachment.  In right eye.  This started 5 weeks ago.  Severity is moderate.  I, the attending physician,  performed the HPI with the patient and updated documentation appropriately.          Comments    Patient here for 5 weeks retina follow up for RD OD. Patient states vision about the same. No eye pain. Gas bubble is gone.        Last edited by [name], MD on 06/03/2019  9:48 PM. (History)    pt states he is using brim, cosopt, and PF bid OD  Referring physician: [name], MD [place], [place] [address]   HISTORICAL INFORMATION:   Selected notes from the MEDICAL RECORD NUMBER Referred by Dr. [name] for evaluation of dislocated IOL   CURRENT MEDICATIONS: Current Outpatient Medications (Ophthalmic Drugs)  Medication Sig  . bacitracin-polymyxin b (POLYSPORIN) ophthalmic ointment Place into the right eye 4 (four) times daily. Place a 1/2 inch ribbon of ointment into the lower eyelid.  Marland Kitchen brimonidine (ALPHAGAN) 0.2 % ophthalmic solution Place 1 drop into the right eye 2 (two) times daily.  . prednisoLONE acetate (PRED FORTE) 1 % ophthalmic suspension Place 1 drop into the right eye 6 (six) times daily.   No current facility-administered medications for this visit. (Ophthalmic Drugs)   Current Outpatient Medications (Other)  Medication Sig  . amLODipine (NORVASC) 5 MG tablet Take 1 tablet (5 mg total) by mouth daily.  Marland Kitchen atorvastatin (LIPITOR) 40 MG tablet Take 1 tablet (40 mg total) by mouth daily.  . Blood Glucose Monitoring Suppl (ONETOUCH VERIO) w/Device KIT Check blood sugar once per day  . buPROPion (WELLBUTRIN SR) 150 MG 12 hr tablet Take 1 tablet (150 mg total) by  mouth 2 (two) times daily.  . clopidogrel (PLAVIX) 75 MG tablet Take 1 tablet (75 mg total) by mouth daily.  . fexofenadine (ALLEGRA ALLERGY) 180 MG tablet Take 1 tablet (180 mg total) by mouth daily.  . fluticasone (FLONASE) 50 MCG/ACT nasal spray Place 1 spray into both nostrils daily.  Marland Kitchen glucose blood (ONETOUCH VERIO) test strip Check sugar once per day  . Lancet Devices (ONE TOUCH DELICA LANCING DEV) MISC Check sugar once per day  . lisinopril (PRINIVIL,ZESTRIL) 20 MG tablet Take 1 tablet (20 mg total) by mouth daily.  . metFORMIN (GLUCOPHAGE) 500 MG tablet Take 1 tablet (500 mg total) by mouth 2 (two) times daily with a meal.  . OneTouch Delica Lancets 42A MISC Check sugar once per day  . VENTOLIN HFA 108 (90 Base) MCG/ACT inhaler INHALE 2 PUFFS BY MOUTH EVERY 4 HOURS AS NEEDED FOR WHEEZING OR SHORTNESS OF BREATH. (Patient taking differently: Inhale 2 puffs into the lungs every 4 (four) hours as needed for wheezing or shortness of breath. )   No current facility-administered medications for this visit. (Other)      REVIEW OF SYSTEMS: ROS    Positive for: Neurological, HENT, Endocrine, Cardiovascular, Eyes, Respiratory   Negative for: Constitutional, Gastrointestinal, Skin, Genitourinary, Musculoskeletal, Psychiatric, Allergic/Imm, Heme/Lymph   Last edited by [name], COA on 06/03/2019  2:11 PM. (History)  ALLERGIES No Known Allergies  PAST MEDICAL HISTORY Past Medical History:  Diagnosis Date  . COPD (chronic obstructive pulmonary disease) ([place])   . Depression   . Diabetes mellitus without complication ([place])   . HTN (hypertension)   . Hyperlipidemia   . Hypertensive retinopathy    OU  . Retinal detachment    Rheg. RD OD  . Sleep apnea   . Stroke [facility])    Past Surgical History:  Procedure Laterality Date  . CATARACT EXTRACTION Bilateral    Dr. [name]  . CIRCUMCISION    . EYE SURGERY Bilateral    Cat Sx OU; RD repair OD  . GAS INSERTION Right  03/13/2019   Procedure: INSERTION OF GAS (C3F8) RIGHT EYE;  Surgeon: [name], MD;  Location: [place];  Service: Ophthalmology;  Laterality: Right;  . LASER PHOTO ABLATION Right 03/13/2019   Procedure: LASER PHOTO  ABLATION RIGHT EYE;  Surgeon: [name], MD;  Location: [place];  Service: Ophthalmology;  Laterality: Right;  . none    . PARS PLANA VITRECTOMY Right 03/13/2019   Procedure: 25 GAUGE PARS PLANA VITRECTOMY WITH  INTRAOCULAR LENSE EXPLANTATION RIGHT EYE.;  Surgeon: [name], MD;  Location: [place];  Service: Ophthalmology;  Laterality: Right;  . PHOTOCOAGULATION WITH LASER Left 03/13/2019   Procedure: INDIRECT PHOTOCOAGULATION WITH LASER LEFT EYE;  Surgeon: [name], MD;  Location: [place];  Service: Ophthalmology;  Laterality: Left;  . RETINAL DETACHMENT SURGERY Right 03/13/2019   PPV for repair of rheg. RD - Dr. [name]    FAMILY HISTORY Family History  Problem Relation Age of Onset  . Cancer Mother        brain tumor  . Heart disease Father   . Cirrhosis Father     SOCIAL HISTORY Social History   Tobacco Use  . Smoking status: Current Every Day Smoker    Packs/day: 1.00    Years: 43.00    Pack years: 43.00    Types: Cigarettes  . Smokeless tobacco: Never Used  . Tobacco comment: pt is trying to aggressively cut back on # cigs smoked daily.   Substance Use Topics  . Alcohol use: No  . Drug use: No         OPHTHALMIC EXAM:  Base Eye Exam    Visual Acuity (Snellen - Linear)      Right Left   Dist [place] CF at 3' 20/25   Dist ph [place] 20/150 20/20 -2       Tonometry (Tonopen, 2:07 PM)      Right Left   Pressure 20 15       Pupils      Dark Light Shape React APD   Right 5 5 Round Minimal None   Left 4 3 Round Minimal None       Visual Fields (Counting fingers)      Left Right    Full    Restrictions  Partial outer superior temporal, inferior temporal, superior nasal, inferior nasal deficiencies       Extraocular Movement      Right  Left    Full, Ortho Full, Ortho       Neuro/Psych    Oriented x3: Yes   Mood/Affect: Normal       Dilation    Both eyes: 1.0% Mydriacyl, 2.5% Phenylephrine @ 2:07 PM        Slit Lamp and Fundus Exam    External Exam      Right Left  External Normal Normal       Slit Lamp Exam      Right Left   Lids/Lashes Dermatochalasis - upper lid, multiple papillomas, Telangiectasia, mild Meibomian gland dysfunction Dermatochalasis - upper lid, multiple papillomas, Telangiectasia, mild Meibomian gland dysfunction   Conjunctiva/Sclera sutures dissolving, trace Injection Pinguecula, trace temporal Injection   Cornea Arcus, well healed temporal cataract wound, 1-2+ Punctate epithelial erosions, well healed superior cataract wound at 1200 with 10-0 nylon suture Arcus, Debris in tear film, 2+Punctate epithelial erosions   Anterior Chamber Deep and clear Deep and quiet   Iris Round and dilated Round and dilated   Lens Aphakic Posterior chamber intraocular lens in good position   Vitreous post vitrectomy; trace pigment, 20-25% gas bubble  Vitreous syneresis       Fundus Exam      Right Left   Disc Pink and Sharp Pink and Sharp, mild temporal PPP   C/D Ratio 0.4 0.4   Macula flat, Blunted foveal reflex, Retinal pigment epithelial mottling Flat, Blunted foveal reflex, No heme or edema   Vessels Vascular attenuation, Tortuous Vascular attenuation, very Tortuous, AV crossing changes   Periphery attached; good laser changes 360; ORIGINALLY: Bullous superior detachment from 1000-130 with multiple tears at 1030 and 1100 Attached, paving stone degeneration inferiorly, retinal hole at 1200 and tears at 430 and 0730; good laser surrounding breaks at 1200, 0430 and 0730        Refraction    Manifest Refraction (Auto)      Sphere Cylinder Axis Dist VA   Right +8.75 +2.00 123 20/80   Left +0.25 +0.50 155 20/20          IMAGING AND PROCEDURES  Imaging and Procedures for _0 @  OCT, Retina - OU  - Both Eyes       Right Eye Quality was good. Central Foveal Thickness: 288. Progression has been stable. Findings include no IRF, no SRF, normal foveal contour (Mild interval improvement in ellipsoid signal).   Left Eye Quality was good. Central Foveal Thickness: 318. Progression has been stable. Findings include normal foveal contour, no IRF, no SRF, epiretinal membrane, macular pucker.   Notes *Images captured and stored on drive  Diagnosis / Impression:  OD: retina stably reattached -- interval improvement in ellipsoid signal; NFP, no IRF/SRF OS: NFP, no IRF/SRF; +ERM w/ pucker--stable  Clinical management:  See below  Abbreviations: NFP - Normal foveal profile. CME - cystoid macular edema. PED - pigment epithelial detachment. IRF - intraretinal fluid. SRF - subretinal fluid. EZ - ellipsoid zone. ERM - epiretinal membrane. ORA - outer retinal atrophy. ORT - outer retinal tubulation. SRHM - subretinal hyper-reflective material                 ASSESSMENT/PLAN:    ICD-10-CM   1. Right retinal detachment  H33.21   2. Retinal edema  H35.81 OCT, Retina - OU - Both Eyes  3. Dislocation of intraocular lens, initial encounter  T85.22XA   4. Retinal breaks without detachment  H35.89   5. Essential hypertension  I10   6. Hypertensive retinopathy of both eyes  H35.033   7. Diabetes mellitus type 2 without retinopathy ([place])  E11.9   8. Pseudophakia of both eyes  Z96.1     1,2. Rhegmatogenous retinal detachment, right eye  - bullous superior mac off detachment, onset ~1 wk, with onset of foveal involvement Tuesday, 03/12/19 by pt history  - detached from 1000 to 0130, fovea off, multiple tears at 1030 and 1100  -  s/p PPV/IOL explanation/EL/FAX/14% C3F8 OD, 11.18.20             - doing well w/ improving vision (BCVA via MRx 20/80)             - retina attached and in good position   - aphakic             - IOP okay at 20  - posterior gas bubble now gone             - dec PF  to BID OD  - cont Atropine QD OD -- okay to stop                         Brimonidine BID OD                          Cosopt BID OD  3. Dislocated IOL OD  - s/p CE/IOL OD on 10.21.20 w/ Dr. [name]  - IOL displaced inferiorly with superior haptic hung up in capsular remnant   - poor dilation -- ?pseudoexfoliation, floppy iris OD  - developed macula/fovea involving RD as above  - s/p IOL explantation during RD repair 11.18.20 as above  - discussed secondary sutured [place] IOL placement -- pt wishes to move forward with surgery  - RBA of procedure discussed, questions answered  - informed consent obtained and signed  - IOL master measurements obtained 11.5.2020 and repeated today 2.8.2021  - 25g PPV w/ secondary sutured IOL placement OD under general anesthesia scheduled for 2.18.2021  - pt will need pre-op COVID testing  - f/u 2.19.21 for POV1  4. Retinal breaks without detachment OS  - scleral depressed exam of OS completed as part of RD work up  - large retinal break at 12, smaller flap tears at 0430 amd 0730  - s/p laser retinopexy OS (11.18.20) -- good laser around breaks at 1200, 0430 and 0730  5,6. Hypertensive retinopathy OU  - discussed importance of tight BP control  - monitor  7. Diabetes mellitus, type 2 without retinopathy  - The incidence, risk factors for progression, natural history and treatment options for diabetic retinopathy  were discussed with patient.    - The need for close monitoring of blood glucose, blood pressure, and serum lipids, avoiding cigarette or any type of tobacco, and the need for long term follow up was also discussed with patient.  - f/u in 1 year, sooner prn  8. Pseudophakia OU  - s/p CE/IOL OU (Dr. [name], October 2020)  - subluxed IOL OD as above - s/p IOL explantation on 11.18.2020  - OS doing well  - post op drops OS per Dr. [name]  - monitor   Ophthalmic Meds Ordered this visit:  No orders of the defined types were placed in this  encounter.      Return in about 11 days (around 06/14/2019) for POV.  There are no Patient Instructions on file for this visit.   Explained the diagnoses, plan, and follow up with the patient and they expressed understanding.  Patient expressed understanding of the importance of proper follow up care.   This document serves as a record of services personally performed by Gardiner Sleeper, MD, PhD. It was created on their behalf by Roselee Nova, COMT. The creation of this record is the provider's dictation and/or activities during the visit.  Electronically signed by: Roselee Nova, COMT 06/03/19 10:07 PM  This document serves as a record of services personally performed by Gardiner Sleeper, MD, PhD. It was created on their behalf by Ernest Mallick, OA, an ophthalmic assistant. The creation of this record is the provider's dictation and/or activities during the visit.    Electronically signed by: Ernest Mallick, OA 02.08.2021 10:07 PM   Gardiner Sleeper, M.D., Ph.D. Diseases & Surgery of the Retina and Vitreous [facility]  I have reviewed the above documentation for accuracy and completeness, and I agree with the above. Gardiner Sleeper, M.D., Ph.D. 06/03/19 10:07 PM     Abbreviations: M myopia (nearsighted); A astigmatism; H hyperopia (farsighted); P presbyopia; Mrx spectacle prescription;  CTL contact lenses; OD right eye; OS left eye; OU both eyes  XT exotropia; ET esotropia; PEK punctate epithelial keratitis; PEE punctate epithelial erosions; DES dry eye syndrome; MGD meibomian gland dysfunction; ATs artificial tears; PFAT's preservative free artificial tears; [place] nuclear sclerotic cataract; PSC posterior subcapsular cataract; ERM epi-retinal membrane; PVD posterior vitreous detachment; RD retinal detachment; DM diabetes mellitus; DR diabetic retinopathy; NPDR non-proliferative diabetic retinopathy; PDR proliferative diabetic retinopathy; CSME clinically significant  macular edema; DME diabetic macular edema; dbh dot blot hemorrhages; CWS cotton wool spot; POAG primary open angle glaucoma; C/D cup-to-disc ratio; HVF humphrey visual field; GVF goldmann visual field; OCT optical coherence tomography; IOP intraocular pressure; BRVO Branch retinal vein occlusion; CRVO central retinal vein occlusion; CRAO central retinal artery occlusion; BRAO branch retinal artery occlusion; RT retinal tear; SB scleral buckle; PPV pars plana vitrectomy; VH Vitreous hemorrhage; PRP panretinal laser photocoagulation; IVK intravitreal kenalog; VMT vitreomacular traction; MH Macular hole;  NVD neovascularization of the disc; NVE neovascularization elsewhere; AREDS age related eye disease study; ARMD age related macular degeneration; POAG primary open angle glaucoma; EBMD epithelial/anterior basement membrane dystrophy; ACIOL anterior chamber intraocular lens; IOL intraocular lens; PCIOL posterior chamber intraocular lens; Phaco/IOL phacoemulsification with intraocular lens placement; [place] photorefractive keratectomy; LASIK laser assisted in situ keratomileusis; HTN hypertension; DM diabetes mellitus; COPD chronic obstructive pulmonary disease

## 2019-06-03 ENCOUNTER — Encounter (INDEPENDENT_AMBULATORY_CARE_PROVIDER_SITE_OTHER): Payer: Self-pay | Admitting: Ophthalmology

## 2019-06-03 ENCOUNTER — Ambulatory Visit (INDEPENDENT_AMBULATORY_CARE_PROVIDER_SITE_OTHER): Payer: Medicare Other | Admitting: Ophthalmology

## 2019-06-03 ENCOUNTER — Other Ambulatory Visit: Payer: Self-pay

## 2019-06-03 DIAGNOSIS — I10 Essential (primary) hypertension: Secondary | ICD-10-CM

## 2019-06-03 DIAGNOSIS — Z961 Presence of intraocular lens: Secondary | ICD-10-CM

## 2019-06-03 DIAGNOSIS — H3581 Retinal edema: Secondary | ICD-10-CM | POA: Diagnosis not present

## 2019-06-03 DIAGNOSIS — H35033 Hypertensive retinopathy, bilateral: Secondary | ICD-10-CM

## 2019-06-03 DIAGNOSIS — T8522XA Displacement of intraocular lens, initial encounter: Secondary | ICD-10-CM

## 2019-06-03 DIAGNOSIS — E119 Type 2 diabetes mellitus without complications: Secondary | ICD-10-CM

## 2019-06-03 DIAGNOSIS — H3589 Other specified retinal disorders: Secondary | ICD-10-CM

## 2019-06-03 DIAGNOSIS — H3321 Serous retinal detachment, right eye: Secondary | ICD-10-CM

## 2019-06-05 ENCOUNTER — Telehealth: Payer: Self-pay | Admitting: Family Medicine

## 2019-06-05 NOTE — Telephone Encounter (Signed)
Received forms requesting clearance for ophthalmologic surgery under general anesthesia. Patient needs to come in for presurgical assessment. Red team can you ask him to schedule an appointment?  Thanks Leeanne Rio, MD

## 2019-06-06 NOTE — Telephone Encounter (Signed)
Patient has surgery scheduled for 06/13/2019.   There are no appointments with Dr. Ardelia Mems until 06/17/2019.  Cane patient see another provider prior to surgery?  Please advise  .Ozella Almond, CMA

## 2019-06-07 NOTE — Telephone Encounter (Signed)
Yes, he can see another provider if needed Thanks Leeanne Rio, MD

## 2019-06-10 ENCOUNTER — Other Ambulatory Visit (HOSPITAL_COMMUNITY)
Admission: RE | Admit: 2019-06-10 | Discharge: 2019-06-10 | Disposition: A | Discharge status: Home / Self Care | Payer: Medicare Other | Source: Ambulatory Visit | Attending: Ophthalmology | Admitting: Ophthalmology

## 2019-06-10 DIAGNOSIS — Z20822 Contact with and (suspected) exposure to covid-19: Secondary | ICD-10-CM | POA: Diagnosis not present

## 2019-06-10 DIAGNOSIS — Z01812 Encounter for preprocedural laboratory examination: Secondary | ICD-10-CM | POA: Insufficient documentation

## 2019-06-10 LAB — SARS CORONAVIRUS 2 (TAT 6-24 HRS): SARS Coronavirus 2: NEGATIVE

## 2019-06-10 NOTE — H&P (Signed)
Jonathan [name] is an 69 y.o. male.    Chief Complaint: aphakia OD  HPI: Pt with a history of a dislocated IOL and retinal detachment s/p IOL explantation and RD repair OD on 11.18.20. Retina is now stably reattached, but pt remains aphakic. After discussion of the risks, benefits, alternatives to surgery, the patient has elected to proceed with placement of a secondary, sutured IOL OD under general anesthesia -- 25g PPV w/ secondary sutured IOL OD.  Past Medical History:  Diagnosis Date  . COPD (chronic obstructive pulmonary disease) ([place])   . Depression   . Diabetes mellitus without complication ([place])   . HTN (hypertension)   . Hyperlipidemia   . Hypertensive retinopathy    OU  . Retinal detachment    Rheg. RD OD  . Sleep apnea   . Stroke [facility])     Past Surgical History:  Procedure Laterality Date  . CATARACT EXTRACTION Bilateral    Dr. [name]  . CIRCUMCISION    . EYE SURGERY Bilateral    Cat Sx OU; RD repair OD  . GAS INSERTION Right 03/13/2019   Procedure: INSERTION OF GAS (C3F8) RIGHT EYE;  Surgeon: [name], MD;  Location: [place];  Service: Ophthalmology;  Laterality: Right;  . LASER PHOTO ABLATION Right 03/13/2019   Procedure: LASER PHOTO  ABLATION RIGHT EYE;  Surgeon: [name], MD;  Location: [place];  Service: Ophthalmology;  Laterality: Right;  . none    . PARS PLANA VITRECTOMY Right 03/13/2019   Procedure: 25 GAUGE PARS PLANA VITRECTOMY WITH  INTRAOCULAR LENSE EXPLANTATION RIGHT EYE.;  Surgeon: [name], MD;  Location: [place];  Service: Ophthalmology;  Laterality: Right;  . PHOTOCOAGULATION WITH LASER Left 03/13/2019   Procedure: INDIRECT PHOTOCOAGULATION WITH LASER LEFT EYE;  Surgeon: [name], MD;  Location: [place];  Service: Ophthalmology;  Laterality: Left;  . RETINAL DETACHMENT SURGERY Right 03/13/2019   PPV for repair of rheg. RD - Dr. [name]    Family History  Problem Relation Age of Onset  . Cancer Mother        brain tumor  . Heart  disease Father   . Cirrhosis Father    Social History:  reports that he has been smoking cigarettes. He has a 43.00 pack-year smoking history. He has never used smokeless tobacco. He reports that he does not drink alcohol or use drugs.  Allergies: No Known Allergies  No medications prior to admission.    Review of systems otherwise negative  There were no vitals taken for this visit.  Physical exam: Mental status: oriented x3. Eyes: See eye exam associated with this date of surgery Ears, Nose, Throat: within normal limits Neck: Within Normal limits General: within normal limits Chest: Within normal limits Breast: deferred Heart: Within normal limits Abdomen: Within normal limits GU: deferred Extremities: within normal limits Skin: within normal limits  Assessment/Plan 1. Aphakia OD  Plan: To [facility] for 25g PPV w/ secondary sutured IOL implantation, OD, under general anesthesia - surgery scheduled for 2.18.21 -- 12 noon -- [facility] OR 08  Gardiner Sleeper, M.D., Ph.D. Vitreoretinal Surgeon [facility]

## 2019-06-10 NOTE — H&P (View-Only) (Signed)
Jonathan [name] is an 69 y.o. male.    Chief Complaint: aphakia OD  HPI: Pt with a history of a dislocated IOL and retinal detachment s/p IOL explantation and RD repair OD on 11.18.20. Retina is now stably reattached, but pt remains aphakic. After discussion of the risks, benefits, alternatives to surgery, the patient has elected to proceed with placement of a secondary, sutured IOL OD under general anesthesia -- 25g PPV w/ secondary sutured IOL OD.  Past Medical History:  Diagnosis Date  . COPD (chronic obstructive pulmonary disease) ([place])   . Depression   . Diabetes mellitus without complication ([place])   . HTN (hypertension)   . Hyperlipidemia   . Hypertensive retinopathy    OU  . Retinal detachment    Rheg. RD OD  . Sleep apnea   . Stroke [facility])     Past Surgical History:  Procedure Laterality Date  . CATARACT EXTRACTION Bilateral    Dr. [name]  . CIRCUMCISION    . EYE SURGERY Bilateral    Cat Sx OU; RD repair OD  . GAS INSERTION Right 03/13/2019   Procedure: INSERTION OF GAS (C3F8) RIGHT EYE;  Surgeon: [name], MD;  Location: [place];  Service: Ophthalmology;  Laterality: Right;  . LASER PHOTO ABLATION Right 03/13/2019   Procedure: LASER PHOTO  ABLATION RIGHT EYE;  Surgeon: [name], MD;  Location: [place];  Service: Ophthalmology;  Laterality: Right;  . none    . PARS PLANA VITRECTOMY Right 03/13/2019   Procedure: 25 GAUGE PARS PLANA VITRECTOMY WITH  INTRAOCULAR LENSE EXPLANTATION RIGHT EYE.;  Surgeon: [name], MD;  Location: [place];  Service: Ophthalmology;  Laterality: Right;  . PHOTOCOAGULATION WITH LASER Left 03/13/2019   Procedure: INDIRECT PHOTOCOAGULATION WITH LASER LEFT EYE;  Surgeon: [name], MD;  Location: [place];  Service: Ophthalmology;  Laterality: Left;  . RETINAL DETACHMENT SURGERY Right 03/13/2019   PPV for repair of rheg. RD - Dr. [name]    Family History  Problem Relation Age of Onset  . Cancer Mother        brain tumor  . Heart  disease Father   . Cirrhosis Father    Social History:  reports that he has been smoking cigarettes. He has a 43.00 pack-year smoking history. He has never used smokeless tobacco. He reports that he does not drink alcohol or use drugs.  Allergies: No Known Allergies  No medications prior to admission.    Review of systems otherwise negative  There were no vitals taken for this visit.  Physical exam: Mental status: oriented x3. Eyes: See eye exam associated with this date of surgery Ears, Nose, Throat: within normal limits Neck: Within Normal limits General: within normal limits Chest: Within normal limits Breast: deferred Heart: Within normal limits Abdomen: Within normal limits GU: deferred Extremities: within normal limits Skin: within normal limits  Assessment/Plan 1. Aphakia OD  Plan: To [facility] for 25g PPV w/ secondary sutured IOL implantation, OD, under general anesthesia - surgery scheduled for 2.18.21 -- 12 noon -- [facility] OR 08  Gardiner Sleeper, M.D., Ph.D. Vitreoretinal Surgeon [facility]

## 2019-06-11 ENCOUNTER — Other Ambulatory Visit: Payer: Self-pay

## 2019-06-11 ENCOUNTER — Ambulatory Visit (INDEPENDENT_AMBULATORY_CARE_PROVIDER_SITE_OTHER): Payer: Medicare Other | Admitting: Family Medicine

## 2019-06-11 ENCOUNTER — Ambulatory Visit (HOSPITAL_COMMUNITY)
Admission: RE | Admit: 2019-06-11 | Discharge: 2019-06-11 | Disposition: A | Discharge status: Home / Self Care | Payer: Medicare Other | Source: Ambulatory Visit | Attending: Family Medicine | Admitting: Family Medicine

## 2019-06-11 VITALS — BP 155/82 | HR 67 | Temp 98.2°F | Wt 198.0 lb

## 2019-06-11 DIAGNOSIS — E1169 Type 2 diabetes mellitus with other specified complication: Secondary | ICD-10-CM | POA: Diagnosis not present

## 2019-06-11 DIAGNOSIS — I10 Essential (primary) hypertension: Secondary | ICD-10-CM

## 2019-06-11 DIAGNOSIS — E785 Hyperlipidemia, unspecified: Secondary | ICD-10-CM | POA: Diagnosis not present

## 2019-06-11 DIAGNOSIS — R001 Bradycardia, unspecified: Secondary | ICD-10-CM | POA: Insufficient documentation

## 2019-06-11 DIAGNOSIS — E11319 Type 2 diabetes mellitus with unspecified diabetic retinopathy without macular edema: Secondary | ICD-10-CM | POA: Diagnosis not present

## 2019-06-11 DIAGNOSIS — Z01810 Encounter for preprocedural cardiovascular examination: Secondary | ICD-10-CM | POA: Insufficient documentation

## 2019-06-11 LAB — POCT GLYCOSYLATED HEMOGLOBIN (HGB A1C): HbA1c, POC (controlled diabetic range): 6.5 % (ref 0.0–7.0)

## 2019-06-11 NOTE — Patient Instructions (Addendum)
I have obtained some blood work Recommend you restart your blood pressure medicines today, tomorrow, and the morning of the surgery with a small sip of water.  I continue to recommend cutting back on smoking as this would help improve healing as well as improve your cardiovascular health.  I will forward your labs to your ophthalmologist.

## 2019-06-11 NOTE — Progress Notes (Signed)
Patient Name: Jonathan [name] Date of Birth: [birth year] Date of Visit: 06/12/19 PCP: Leeanne Rio, MD  Chief Complaint: preoperative evaluation Surgeon:  [name], MD, PhD Surgery: Eye surgery - Aphakia OD under general anesthesia  Date of Procedure: 06/13/19  Subjective: Jonathan [name] is a pleasant 68 y.o. 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    The patient can walk city blocks and or up 2 flights of stairs without difficulty. He notes he still runs around with his grand kids chasing them around without any difficulty. He has an occasional smokers cough but denies regular productive cough.   Prior surgeries: Cataract extraction bilateral, retinal attachment, laser photocoagulation, pars plana vitrectomy Difficulty with surgical procedures in the past: Denies any past surgical complications History of difficulty with anesthesia: None History of venous thromboembolic disease: None History of easy bleeding with procedures: None Family history of venous thromboembolic disease: None Family history of bleeding diathesis: NOne  Review of systems: Chest pain with exertion? No Dyspnea on exertion? No Can you walk 2 blocks without dyspnea or chest pain? Yes  Can you walk up a flight of stairs without chest pain or dyspnea? Yes Any history of easy bruising or bleeding? No Any family history of bleeding diathesis? No Any personal or family of difficulty with anesthesia? No Any history of sleep apnea? Yes - uses CPAP occasionally (3x/week)  Medical History: Cardiac conditions? None History of VTE? None History of adverse surgical outcome? None Diabetes? Yes, A1C 6.5, currently on Metformin 500mg  BID Obesity? BMI 31 OSA? YES - uses CPAP 3x/week Tobacco User: Currently smokes 1 PPD x >50 years   The patient has a history of hypertension which is  normally well-controlled on lisinopril and amlodipine. He denies side effects from this medication. Patient endorses discontinuation of his meds in preparation for his surgery which he noes is why his BP is elevated today.   The patient has a history of type 2 diabetes this is incredibly well controlled.  She is on Metformin 500mg  BID. She denies polyuria or polydipsia.    ROS:  Denies chest pain, dyspnea on exertion, chronic cough, history of venous thromboembolism, family history of venous thromboembolism, personal or family history of easy bleeding or bruising, or personal history or family history of difficulty with anesthesia.  I have reviewed the patient's medical, surgical, family, and social history as appropriate.   Objective: Vitals:   06/11/19 0916 06/11/19 1011  BP: (!) 168/95 (!) 155/82  Pulse: 67   Temp: 98.2 F (36.8 C)   SpO2: 96%    Filed Weights   06/11/19 0916  Weight: 198 lb (89.8 kg)   General: pleasant older male, well nourished, well developed, in no acute distress with non-toxic appearance CV: regular rate and rhythm without murmurs, rubs, or gallops, no lower extremity edema Lungs: clear to auscultation bilaterally with normal work of breathing Abdomen: soft, non-tender, non-distended, normoactive bowel sounds Skin: warm, dry Extremities: warm and well perfused MSK: gait normal   Assessment/Plan: [place] was seen today for surgery clearance.  Diagnoses and all orders for this visit:  Type 2 diabetes mellitus with retinopathy, without long-term current use of insulin, macular edema presence unspecified, unspecified laterality, unspecified retinopathy severity (HCC) -     HgB A1c -     Basic Metabolic Panel Well controlled. Hold Metformin on day of surgery. Kidney functions stable.  Encounter for pre-operative cardiovascular clearance   - 12-lead EKG EKG reviewed. Chronic sinus bradycardia of 52bpm and t-wave abnormalities. Unchanged from priors.  He  can achieve greater than 4 METS.    Essential hypertension -     Basic Metabolic Panel Blood pressure elevated today. Initial BP: 168/95, Repeat BP 155/82. Patient had self discontinued antihypertensives in preparation for surgery due to some confusion. Discussed with Dr. Coralyn Pear. Patient was not supposed to discontinue his blood pressure medications. Patient instructed to restart his BP meds today and continue on morning of with small sip of water. Would recommend delaying surgery if these continue to be elevated on day of surgery. BMP obtained, kidney function stable.  Hyperlipidemia associated with type 2 diabetes mellitus (HCC) -     Lipid Panel Elevated lipid panel. Continue Atorvastatin 40mg  QD.  Cerebrovascular accident due to thrombosis of cerebral artery [facility]): - Plavix held for 5 days.   OSA:  Recommend using CPAP regularly  The patient has a less than 1% chance of perioperative cardiovascular event by the [facility] calculator. Patient is moderate risk for a low risk surgery. His greatest risk factor for a poor perioperative outcome is his elevated blood pressure and tobacco abuse which I discussed with her today. He has been instructed to restart his blood pressure medications until told otherwise by anesthesia. Labs obtained (BMP, lipid, A1c, EKG) and have been reviewed. He has been medically optimized for surgery.  - Hold metformin on day of surgery - Continue antihypertensives and statin on day of surgery - Recommended smoking cessation, discussed benefits with patient, discussed risks of continuing smoking including delayed healing, poor surgical outcomes, cardiovascular events. This should not preclude surgery should it be indicated.   Mina Marble, DO [facility], PGY2 06/11/19

## 2019-06-12 ENCOUNTER — Encounter (HOSPITAL_COMMUNITY): Payer: Self-pay | Admitting: Vascular Surgery

## 2019-06-12 ENCOUNTER — Encounter (HOSPITAL_COMMUNITY): Payer: Self-pay | Admitting: Ophthalmology

## 2019-06-12 LAB — BASIC METABOLIC PANEL
BUN/Creatinine Ratio: 15 (ref 10–24)
BUN: 20 mg/dL (ref 8–27)
CO2: 19 mmol/L — ABNORMAL LOW (ref 20–29)
Calcium: 9.1 mg/dL (ref 8.6–10.2)
Chloride: 104 mmol/L (ref 96–106)
Creatinine, Ser: 1.37 mg/dL — ABNORMAL HIGH (ref 0.76–1.27)
GFR calc Af Amer: 61 mL/min/{1.73_m2} (ref >59)
GFR calc non Af Amer: 53 mL/min/{1.73_m2} — ABNORMAL LOW (ref >59)
Glucose: 115 mg/dL — ABNORMAL HIGH (ref 65–99)
Potassium: 4.5 mmol/L (ref 3.5–5.2)
Sodium: 139 mmol/L (ref 134–144)

## 2019-06-12 LAB — LIPID PANEL
Chol/HDL Ratio: 6.8 ratio — ABNORMAL HIGH (ref 0.0–5.0)
Cholesterol, Total: 197 mg/dL (ref 100–199)
HDL: 29 mg/dL — ABNORMAL LOW (ref >39)
LDL Chol Calc (NIH): 134 mg/dL — ABNORMAL HIGH (ref 0–99)
Triglycerides: 185 mg/dL — ABNORMAL HIGH (ref 0–149)
VLDL Cholesterol Cal: 34 mg/dL (ref 5–40)

## 2019-06-12 NOTE — Assessment & Plan Note (Deleted)
Well controlled. Hold Metformin on day of surgery.

## 2019-06-12 NOTE — Progress Notes (Signed)
Patient wants to cancel and r/s surgery for tomorrow due to weather/ice.. Dr Coralyn Pear called and informed.  OR desk notified.

## 2019-06-12 NOTE — Anesthesia Preprocedure Evaluation (Deleted)
Anesthesia Evaluation Anesthesia Physical Anesthesia Plan  ASA:   Anesthesia Plan:    Post-op Pain Management:    Induction:   PONV Risk Score and Plan:   Airway Management Planned:   Additional Equipment:   Intra-op Plan:   Post-operative Plan:   Informed Consent:   Plan Discussed with:   Anesthesia Plan Comments: (PAT note written 06/12/2019 by Myra Gianotti, PA-C. SAME DAY WORK-UP. Preoperative evaluation done at [facility] on 06/11/19.    )        Anesthesia Quick Evaluation

## 2019-06-12 NOTE — Progress Notes (Addendum)
Anesthesia Chart Review:  Case: [id number] Date/Time: 06/13/19 1145   Procedures:      PLACEMENT AND SUTURE OF SECONDARY INTRAOCULAR LENS (Right )     PARS PLANA VITRECTOMY WITH 25 GAUGE (Right )   Anesthesia type: General   Pre-op diagnosis: Aphakia OD   Location: [facility] OR ROOM 08 / [place] OR   Surgeons: [name], MD      DISCUSSION: Patient is a 69 year old male scheduled for the above procedure.  History includes smoking, HTN, DM2, CVA (03/2016), COPD, OSA (CPAP, occasionally), HLD, retinopathy/retinal detachment.  He was seen by Dr. Tarry Kos at the [facility] on 06/11/19 for preoperative evaluation (note not yet finalized) and had EKG and labs. Known abnormal EKG (T wave inversions). Interpreting cardiologist felt it was not significantly changed from prior. BP was 168/95, but patient had inadvertently discontinued his BP medication in anticipation for surgery (was to hold Plavix). After discussion with Dr. Coralyn Pear, BP medications were resumed (except hold lisinopril on the day of surgery). If BP significantly elevated on the day of surgery then Dr. Tarry Kos would recommend delaying surgery. She advised smoking cessation. She notes that "The patient can walk city blocks and or up 2 flights of stairs without difficulty. He notes he still runs around with his grand kids chasing them around without any difficulty. He has an occasional smokers cough but denies regular productive cough."   06/10/19 COVID-19 negative. Anesthesia team to evaluate on the day of surgery.   VS:   06/11/19 0916  BP: (!) 168/95  Pulse: 67  Temp: 98.2 F (36.8 C)  SpO2: 96%      Filed Weights   06/11/19 0916  Weight: 198 lb (89.8 kg)      PROVIDERS: Leeanne Rio, MD is PCP. Last evaluation on 06/11/19 by Mina Marble, DO at [facility] for preoperative evaulaiton--see DISUCSSION. Dohmeier, Asencion Partridge, MD is neurologist (sleep medicine)   LABS: Last lab results  include: Lab Results  Component Value Date   WBC 6.9 03/13/2019   HGB 15.5 03/13/2019   HCT 47.8 03/13/2019   PLT 265 03/13/2019   GLUCOSE 115 (H) 06/11/2019   NA 139 06/11/2019   K 4.5 06/11/2019   CL 104 06/11/2019   CREATININE 1.37 (H) 06/11/2019   BUN 20 06/11/2019   CO2 19 (L) 06/11/2019   HGBA1C 6.5 06/11/2019    IMAGES: CT temporal bones 02/15/19: IMPRESSION: 1. Right mastoid tip effusion. Moderate right middle ear effusion. Negative for cholesteatoma. 2. Mild left mastoid tip effusion. Left middle ear clear. No cholesteatoma.   EKG: - EKG 06/11/19 10:59:52: Sinus bradycardia at  52 bpm Rightward axis T wave abnormality, consider inferolateral ischemia Abnormal ECG No significant change since last tracing Confirmed by Cristopher Peru [phone]) on 06/11/2019 6:04:35 PM - Last confirmed EKG in [facility] is from 07/13/17. He has had chronic lateral T wave on tracings dating back to at least 2012. I think inferior T wave abnormalities appear more prominent since 07/13/17.      CV: Echo 04/04/16: Study Conclusions  - Left ventricle: The cavity size was normal. Wall thickness was  increased in a pattern of mild LVH. Systolic function was normal.  The estimated ejection fraction was in the range of 60% to 65%.  Wall motion was normal; there were no regional wall motion  abnormalities. Doppler parameters are consistent with abnormal  left ventricular relaxation (grade 1 diastolic dysfunction). The  E/e&' ratio is between 8-15, suggesting indeterminate LV  filling  pressure.  - Left atrium: The atrium was normal in size.  - Inferior vena cava: The vessel was normal in size. The  respirophasic diameter changes were in the normal range (>= 50%),  consistent with normal central venous pressure.  Impressions:  - LVEF 60-65%, mild LVH, normal wall motion, diastolic dysfunction,  indeterminate LV filling pressure, normal LA size, normal IVC.    Carotid US  04/04/16: Summary:  Findings suggest 40-59% right internal carotid artery stenosis and  1-39% left internal carotid artery stenosis. Vertebral arteries are  patent with antegrade flow.    Past Medical History:  Diagnosis Date  . COPD (chronic obstructive pulmonary disease) ([place])   . Depression   . Diabetes mellitus without complication ([place])   . HTN (hypertension)   . Hyperlipidemia   . Hypertensive retinopathy    OU  . Retinal detachment    Rheg. RD OD  . Sleep apnea   . Stroke [facility])     Past Surgical History:  Procedure Laterality Date  . CATARACT EXTRACTION Bilateral    Dr. [name]  . CIRCUMCISION    . EYE SURGERY Bilateral    Cat Sx OU; RD repair OD  . GAS INSERTION Right 03/13/2019   Procedure: INSERTION OF GAS (C3F8) RIGHT EYE;  Surgeon: [name], MD;  Location: [place];  Service: Ophthalmology;  Laterality: Right;  . LASER PHOTO ABLATION Right 03/13/2019   Procedure: LASER PHOTO  ABLATION RIGHT EYE;  Surgeon: [name], MD;  Location: [place];  Service: Ophthalmology;  Laterality: Right;  . none    . PARS PLANA VITRECTOMY Right 03/13/2019   Procedure: 25 GAUGE PARS PLANA VITRECTOMY WITH  INTRAOCULAR LENSE EXPLANTATION RIGHT EYE.;  Surgeon: [name], MD;  Location: [place];  Service: Ophthalmology;  Laterality: Right;  . PHOTOCOAGULATION WITH LASER Left 03/13/2019   Procedure: INDIRECT PHOTOCOAGULATION WITH LASER LEFT EYE;  Surgeon: [name], MD;  Location: [place];  Service: Ophthalmology;  Laterality: Left;  . RETINAL DETACHMENT SURGERY Right 03/13/2019   PPV for repair of rheg. RD - Dr. [name]    MEDICATIONS: No current facility-administered medications for this encounter.   Marland Kitchen acetaminophen (TYLENOL) 500 MG tablet  . amLODipine (NORVASC) 5 MG tablet  . brimonidine (ALPHAGAN) 0.2 % ophthalmic solution  . clopidogrel (PLAVIX) 75 MG tablet  . dorzolamide-timolol (COSOPT) 22.3-6.8 MG/ML ophthalmic solution  . lisinopril (PRINIVIL,ZESTRIL) 20 MG  tablet  . metFORMIN (GLUCOPHAGE) 500 MG tablet  . prednisoLONE acetate (PRED FORTE) 1 % ophthalmic suspension  . atorvastatin (LIPITOR) 40 MG tablet  . bacitracin-polymyxin b (POLYSPORIN) ophthalmic ointment  . Blood Glucose Monitoring Suppl (ONETOUCH VERIO) w/Device KIT  . buPROPion (WELLBUTRIN SR) 150 MG 12 hr tablet  . fexofenadine (ALLEGRA ALLERGY) 180 MG tablet  . fluticasone (FLONASE) 50 MCG/ACT nasal spray  . glucose blood (ONETOUCH VERIO) test strip  . Lancet Devices (ONE TOUCH DELICA LANCING DEV) MISC  . OneTouch Delica Lancets 50V MISC    Myra Gianotti, PA-C Surgical Short Stay/Anesthesiology [facility] Phone [phone] [facility] Phone [phone] 06/12/2019 10:36 AM

## 2019-06-13 ENCOUNTER — Ambulatory Visit (HOSPITAL_COMMUNITY): Admission: RE | Admit: 2019-06-13 | Payer: Medicare Other | Source: Home / Self Care | Admitting: Ophthalmology

## 2019-06-13 ENCOUNTER — Encounter (HOSPITAL_COMMUNITY): Admission: RE | Payer: Self-pay | Source: Home / Self Care

## 2019-06-13 DIAGNOSIS — H2701 Aphakia, right eye: Secondary | ICD-10-CM | POA: Diagnosis not present

## 2019-06-13 SURGERY — PLACEMENT AND SUTURE OF SECONDARY INTRAOCULAR LENS
Anesthesia: General | Laterality: Right

## 2019-06-14 ENCOUNTER — Encounter (INDEPENDENT_AMBULATORY_CARE_PROVIDER_SITE_OTHER): Payer: Medicare Other | Admitting: Ophthalmology

## 2019-06-15 ENCOUNTER — Other Ambulatory Visit: Payer: Self-pay | Admitting: Family Medicine

## 2019-06-15 DIAGNOSIS — I10 Essential (primary) hypertension: Secondary | ICD-10-CM

## 2019-06-18 ENCOUNTER — Other Ambulatory Visit: Payer: Self-pay

## 2019-06-18 DIAGNOSIS — I10 Essential (primary) hypertension: Secondary | ICD-10-CM

## 2019-06-19 MED ORDER — LISINOPRIL 20 MG PO TABS: 20.0000 mg | ORAL_TABLET | Freq: Every day | ORAL | 3 refills | Status: DC

## 2019-06-24 ENCOUNTER — Other Ambulatory Visit (HOSPITAL_COMMUNITY)
Admission: RE | Admit: 2019-06-24 | Discharge: 2019-06-24 | Disposition: A | Discharge status: Home / Self Care | Payer: Medicare Other | Source: Ambulatory Visit | Attending: Ophthalmology | Admitting: Ophthalmology

## 2019-06-24 ENCOUNTER — Encounter (INDEPENDENT_AMBULATORY_CARE_PROVIDER_SITE_OTHER): Payer: Medicare Other | Admitting: Ophthalmology

## 2019-06-24 DIAGNOSIS — Z20822 Contact with and (suspected) exposure to covid-19: Secondary | ICD-10-CM | POA: Insufficient documentation

## 2019-06-24 DIAGNOSIS — Z01812 Encounter for preprocedural laboratory examination: Secondary | ICD-10-CM | POA: Insufficient documentation

## 2019-06-24 LAB — SARS CORONAVIRUS 2 (TAT 6-24 HRS): SARS Coronavirus 2: NEGATIVE

## 2019-06-25 ENCOUNTER — Other Ambulatory Visit: Payer: Self-pay

## 2019-06-25 ENCOUNTER — Encounter (HOSPITAL_COMMUNITY): Payer: Self-pay | Admitting: Ophthalmology

## 2019-06-25 NOTE — Progress Notes (Signed)
[facility] Note  06/28/2019     CHIEF COMPLAINT Patient presents for Post-op Follow-up   HISTORY OF PRESENT ILLNESS: Jonathan [name] is a 69 y.o. male who presents to the clinic today for:   HPI    Post-op Follow-up    In right eye.  Discomfort includes foreign body sensation.  Negative for pain, itching, tearing, discharge, floaters and none.  Vision is blurred at distance and is blurred at near.  I, the attending physician,  performed the HPI with the patient and updated documentation appropriately.          Comments    Pt is here for POV for sutured IOL on 03.04, pt states last night was "rough", he states he is not in any pain today, but his eye feels "scratchy"       Last edited by [name], MD on 06/28/2019  8:16 AM. (History)      Referring physician: [name], MD [place], [place] [id number]   HISTORICAL INFORMATION:   Selected notes from the MEDICAL RECORD NUMBER Referred by Dr. [name] for evaluation of dislocated IOL   CURRENT MEDICATIONS: Current Outpatient Medications (Ophthalmic Drugs)  Medication Sig  . bacitracin-polymyxin b (POLYSPORIN) ophthalmic ointment Place into the right eye 4 (four) times daily. Place a 1/2 inch ribbon of ointment into the lower eyelid. (Patient not taking: Reported on 06/07/2019)  . brimonidine (ALPHAGAN) 0.2 % ophthalmic solution Place 1 drop into the right eye 2 (two) times daily.  . dorzolamide-timolol (COSOPT) 22.3-6.8 MG/ML ophthalmic solution Place 1 drop into the right eye 2 (two) times daily.  . prednisoLONE acetate (PRED FORTE) 1 % ophthalmic suspension Place 1 drop into the right eye 6 (six) times daily. (Patient taking differently: Place 1 drop into the right eye in the morning and at bedtime. )   No current facility-administered medications for this visit. (Ophthalmic Drugs)   Current Outpatient Medications (Other)  Medication Sig  . acetaminophen (TYLENOL) 500 MG tablet  Take 1,000 mg by mouth every 6 (six) hours as needed for moderate pain or headache.  Marland Kitchen amLODipine (NORVASC) 5 MG tablet Take 1 tablet (5 mg total) by mouth daily.  Marland Kitchen atorvastatin (LIPITOR) 40 MG tablet Take 1 tablet (40 mg total) by mouth daily. (Patient not taking: Reported on 06/07/2019)  . Blood Glucose Monitoring Suppl (ONETOUCH VERIO) w/Device KIT Check blood sugar once per day  . buPROPion (WELLBUTRIN SR) 150 MG 12 hr tablet Take 1 tablet (150 mg total) by mouth 2 (two) times daily. (Patient not taking: Reported on 06/07/2019)  . clopidogrel (PLAVIX) 75 MG tablet Take 1 tablet (75 mg total) by mouth daily.  . fexofenadine (ALLEGRA ALLERGY) 180 MG tablet Take 1 tablet (180 mg total) by mouth daily. (Patient not taking: Reported on 06/07/2019)  . fluticasone (FLONASE) 50 MCG/ACT nasal spray Place 1 spray into both nostrils daily. (Patient not taking: Reported on 06/07/2019)  . glucose blood (ONETOUCH VERIO) test strip Check sugar once per day  . Lancet Devices (ONE TOUCH DELICA LANCING DEV) MISC Check sugar once per day  . lisinopril (ZESTRIL) 20 MG tablet Take 1 tablet (20 mg total) by mouth daily.  . metFORMIN (GLUCOPHAGE) 500 MG tablet Take 1 tablet (500 mg total) by mouth 2 (two) times daily with a meal.  . OneTouch Delica Lancets 53I MISC Check sugar once per day   No current facility-administered medications for this visit. (Other)  REVIEW OF SYSTEMS: ROS    Positive for: Cardiovascular, Eyes, Psychiatric   Negative for: Constitutional, Gastrointestinal, Neurological, Skin, Genitourinary, Musculoskeletal, HENT, Endocrine, Respiratory, Allergic/Imm, Heme/Lymph   Last edited by Debbrah Alar, COT on 06/28/2019  8:05 AM. (History)       ALLERGIES No Known Allergies  PAST MEDICAL HISTORY Past Medical History:  Diagnosis Date  . Anxiety   . Arthritis   . COPD (chronic obstructive pulmonary disease) ([place])   . Depression   . Diabetes mellitus without complication ([place])   .  Diarrhea   . History of kidney stones   . HTN (hypertension)   . Hyperlipidemia   . Hypertensive retinopathy    OU  . Pneumonia   . Retinal detachment    Rheg. RD OD  . Sleep apnea    does not use cpap  . Stroke [facility])    denies any deficits   Past Surgical History:  Procedure Laterality Date  . CATARACT EXTRACTION Bilateral    Dr. [name]  . CIRCUMCISION    . EYE SURGERY Bilateral    Cat Sx OU; RD repair OD  . GAS INSERTION Right 03/13/2019   Procedure: INSERTION OF GAS (C3F8) RIGHT EYE;  Surgeon: [name], MD;  Location: [facility];  Service: Ophthalmology;  Laterality: Right;  . LASER PHOTO ABLATION Right 03/13/2019   Procedure: LASER PHOTO  ABLATION RIGHT EYE;  Surgeon: [name], MD;  Location: [facility];  Service: Ophthalmology;  Laterality: Right;  . none    . PARS PLANA VITRECTOMY Right 03/13/2019   Procedure: 25 GAUGE PARS PLANA VITRECTOMY WITH  INTRAOCULAR LENSE EXPLANTATION RIGHT EYE.;  Surgeon: [name], MD;  Location: [facility];  Service: Ophthalmology;  Laterality: Right;  . PHOTOCOAGULATION WITH LASER Left 03/13/2019   Procedure: INDIRECT PHOTOCOAGULATION WITH LASER LEFT EYE;  Surgeon: [name], MD;  Location: [facility];  Service: Ophthalmology;  Laterality: Left;  . RETINAL DETACHMENT SURGERY Right 03/13/2019   PPV for repair of rheg. RD - Dr. [name]    FAMILY HISTORY Family History  Problem Relation Age of Onset  . Cancer Mother        brain tumor  . Heart disease Father   . Cirrhosis Father     SOCIAL HISTORY Social History   Tobacco Use  . Smoking status: Current Every Day Smoker    Packs/day: 1.00    Years: 43.00    Pack years: 43.00    Types: Cigarettes  . Smokeless tobacco: Never Used  . Tobacco comment: pt is trying to aggressively cut back on # cigs smoked daily.   Substance Use Topics  . Alcohol use: No  . Drug use: No         OPHTHALMIC EXAM:  Base Eye Exam    Visual Acuity (Snellen - Linear)      Right Left   Dist [place]  20/200 -1 20/20 -2   Dist ph [place] NI        Tonometry (Tonopen, 8:12 AM)      Right Left   Pressure 12 15       Pupils      Dark Light Shape React APD   Right 7 7 Round NR None   Left 3 2 Round Brisk None       Neuro/Psych    Oriented x3: Yes   Mood/Affect: Normal        Slit Lamp and Fundus Exam    External Exam      Right  Left   External Normal Normal       Slit Lamp Exam      Right Left   Lids/Lashes Dermatochalasis - upper lid, multiple papillomas, Telangiectasia, mild Meibomian gland dysfunction Dermatochalasis - upper lid, multiple papillomas, Telangiectasia, mild Meibomian gland dysfunction   Conjunctiva/Sclera Subconjunctival hemorrhage, sutures intact Pinguecula, trace temporal Injection   Cornea Arcus, well healed temporal cataract wound, 1-2+ Punctate epithelial erosions, 2-3+ Descemet's folds.2 nylon sutures at 1200, nylon sutures at 0300 and 0900 Arcus, Debris in tear film, 2+Punctate epithelial erosions   Anterior Chamber Deep, 2-3+cell/pigment Deep and quiet   Iris Round and dilated Round and dilated   Lens Sutured Akreos IOL in excellent position Posterior chamber intraocular lens in good position   Vitreous post vitrectomy; trace pigment Vitreous syneresis       Fundus Exam      Right Left   Disc Pink and Sharp    C/D Ratio 0.4 0.4   Macula flat, Blunted foveal reflex, Retinal pigment epithelial mottling    Vessels Vascular attenuation, Tortuous Vascular attenuation, very Tortuous, AV crossing changes   Periphery attached; good laser changes 360, new row of posterior laser; ORIGINALLY: Bullous superior detachment from 1000-130 with multiple tears at 1030 and 1100 Attached, paving stone degeneration inferiorly, retinal hole at 1200 and tears at 430 and 0730; good laser surrounding breaks at 1200, 0430 and 0730          IMAGING AND PROCEDURES  Imaging and Procedures for _0 @           ASSESSMENT/PLAN:    ICD-10-CM   1. Right retinal  detachment  H33.21   2. Retinal edema  H35.81   3. Dislocation of intraocular lens, sequela  T85.22XS   4. Retinal breaks without detachment  H35.89   5. Essential hypertension  I10   6. Hypertensive retinopathy of both eyes  H35.033   7. Diabetes mellitus type 2 without retinopathy ([place])  E11.9   8. Pseudophakia of both eyes  Z96.1     1,2. Rhegmatogenous retinal detachment, right eye  - bullous superior mac off detachment, onset ~1 wk, with onset of foveal involvement Tuesday, 03/12/19 by pt history  - detached from 1000 to 0130, fovea off, multiple tears at 1030 and 1100  - s/p PPV/IOL explanation/EL/FAX/14% C3F8 OD, 11.18.20             - retina attached and in good position             - IOP okay at 12             - increase PF to q2h OD  3. Dislocated IOL OD  - s/p CE/IOL OD on 10.21.20 w/ Dr. [name]  - IOL displaced inferiorly with superior haptic hung up in capsular remnant   - poor dilation -- ?pseudoexfoliation, floppy iris OD  - developed macula/fovea involving RD as above  - s/p IOL explantation during RD repair 11.18.20 as above  - s/p secondary sutured Akreos IOL 03.04.21             - doing well this morning             - retina attached and in good position              - IOP good at 12             - start   PF q2h OD  zymaxid QID OD                          PSO ung QID OD              - eye shield when sleeping              - post op drop and positioning instructions reviewed              - tylenol/ibuprofen for pain   - f/u 1 week  4. Retinal breaks without detachment OS  - scleral depressed exam of OS completed as part of RD work up  - large retinal break at 12, smaller flap tears at 0430 amd 0730  - s/p laser retinopexy OS (11.18.20) -- good laser around breaks at 1200, 0430 and 0730  5,6. Hypertensive retinopathy OU  - discussed importance of tight BP control  - monitor  7. Diabetes mellitus, type 2 without retinopathy  - The  incidence, risk factors for progression, natural history and treatment options for diabetic retinopathy  were discussed with patient.    - The need for close monitoring of blood glucose, blood pressure, and serum lipids, avoiding cigarette or any type of tobacco, and the need for long term follow up was also discussed with patient.  - f/u in 1 year, sooner prn  8. Pseudophakia OU  - s/p CE/IOL OU (Dr. [name], October 2020)  - subluxed IOL OD as above - s/p IOL explantation on 11.18.2020, sutured IOL on 3.4.21  - OS doing well  - monitor   Ophthalmic Meds Ordered this visit:  No orders of the defined types were placed in this encounter.      Return in about 1 week (around 07/05/2019) for s/p secondary sutured IOL.  There are no Patient Instructions on file for this visit.   Explained the diagnoses, plan, and follow up with the patient and they expressed understanding.  Patient expressed understanding of the importance of proper follow up care.   This document serves as a record of services personally performed by Gardiner Sleeper, MD, PhD. It was created on their behalf by Ernest Mallick, OA, an ophthalmic assistant. The creation of this record is the provider's dictation and/or activities during the visit.    Electronically signed by: Ernest Mallick, OA 03.02.2021 12:08 AM   Gardiner Sleeper, M.D., Ph.D. Diseases & Surgery of the Retina and Vitreous [facility]  I have reviewed the above documentation for accuracy and completeness, and I agree with the above. Gardiner Sleeper, M.D., Ph.D. 06/30/19 12:08 AM   Abbreviations: M myopia (nearsighted); A astigmatism; H hyperopia (farsighted); P presbyopia; Mrx spectacle prescription;  CTL contact lenses; OD right eye; OS left eye; OU both eyes  XT exotropia; ET esotropia; PEK punctate epithelial keratitis; PEE punctate epithelial erosions; DES dry eye syndrome; MGD meibomian gland dysfunction; ATs artificial tears;  PFAT's preservative free artificial tears; Garibaldi nuclear sclerotic cataract; PSC posterior subcapsular cataract; ERM epi-retinal membrane; PVD posterior vitreous detachment; RD retinal detachment; DM diabetes mellitus; DR diabetic retinopathy; NPDR non-proliferative diabetic retinopathy; PDR proliferative diabetic retinopathy; CSME clinically significant macular edema; DME diabetic macular edema; dbh dot blot hemorrhages; CWS cotton wool spot; POAG primary open angle glaucoma; C/D cup-to-disc ratio; HVF humphrey visual field; GVF goldmann visual field; OCT optical coherence tomography; IOP intraocular pressure; BRVO Branch retinal vein occlusion; CRVO central retinal vein occlusion; CRAO central retinal artery occlusion; BRAO  branch retinal artery occlusion; RT retinal tear; SB scleral buckle; PPV pars plana vitrectomy; VH Vitreous hemorrhage; PRP panretinal laser photocoagulation; IVK intravitreal kenalog; VMT vitreomacular traction; MH Macular hole;  NVD neovascularization of the disc; NVE neovascularization elsewhere; AREDS age related eye disease study; ARMD age related macular degeneration; POAG primary open angle glaucoma; EBMD epithelial/anterior basement membrane dystrophy; ACIOL anterior chamber intraocular lens; IOL intraocular lens; PCIOL posterior chamber intraocular lens; Phaco/IOL phacoemulsification with intraocular lens placement; [place] photorefractive keratectomy; LASIK laser assisted in situ keratomileusis; HTN hypertension; DM diabetes mellitus; COPD chronic obstructive pulmonary disease

## 2019-06-25 NOTE — Progress Notes (Signed)
Spoke with pt's wife, Jonathan [name] for pre-op call. DPR on file and pt was in same room while we were talking.  Jonathan [name] states pt does not have a cardiac history, but pt did have a stroke in 2016 or 2017, states no last deficits from stroke. Pt is on Plavix and was instructed to hold it prior to surgery, last dose was 06/21/26 Pt is a type 2 diabetic. Last A1C was 6.5 on 06/11/19. She states his fasting blood sugar is usually between 90-100. Pt is not to take Metformin the day of surgery. Instructed her to have pt check his blood sugar when he gets up Thursday AM and every 2 hours until he leaves for the hospital.  If blood sugar is 70 or below, treat with 1/2 cup of clear juice (apple or cranberry) and recheck blood sugar 15 minutes after drinking juice. If blood sugar continues to be 70 or below, call the Short Stay department and ask to speak to a nurse. Jonathan [name] voiced understanding.  Covid test done yesterday and it is negative. Jonathan [name] states pt has been in quarantine since the test was done and understands that patient needs to continue to quarantine until he gets to the hospital Thursday. She voiced understanding.

## 2019-06-27 ENCOUNTER — Encounter (HOSPITAL_COMMUNITY): Payer: Self-pay | Admitting: Ophthalmology

## 2019-06-27 ENCOUNTER — Encounter (HOSPITAL_COMMUNITY)
Admission: RE | Disposition: A | Discharge status: Home / Self Care | Payer: Self-pay | Source: Home / Self Care | Attending: Ophthalmology

## 2019-06-27 ENCOUNTER — Ambulatory Visit (HOSPITAL_COMMUNITY): Payer: Medicare Other | Admitting: Anesthesiology

## 2019-06-27 ENCOUNTER — Other Ambulatory Visit: Payer: Self-pay

## 2019-06-27 ENCOUNTER — Ambulatory Visit (HOSPITAL_COMMUNITY)
Admission: RE | Admit: 2019-06-27 | Discharge: 2019-06-27 | Disposition: A | Discharge status: Home / Self Care | Payer: Medicare Other | Attending: Ophthalmology | Admitting: Ophthalmology

## 2019-06-27 DIAGNOSIS — G4730 Sleep apnea, unspecified: Secondary | ICD-10-CM | POA: Insufficient documentation

## 2019-06-27 DIAGNOSIS — J449 Chronic obstructive pulmonary disease, unspecified: Secondary | ICD-10-CM | POA: Diagnosis not present

## 2019-06-27 DIAGNOSIS — Z8669 Personal history of other diseases of the nervous system and sense organs: Secondary | ICD-10-CM | POA: Insufficient documentation

## 2019-06-27 DIAGNOSIS — Z8249 Family history of ischemic heart disease and other diseases of the circulatory system: Secondary | ICD-10-CM | POA: Insufficient documentation

## 2019-06-27 DIAGNOSIS — E119 Type 2 diabetes mellitus without complications: Secondary | ICD-10-CM | POA: Diagnosis not present

## 2019-06-27 DIAGNOSIS — F17210 Nicotine dependence, cigarettes, uncomplicated: Secondary | ICD-10-CM | POA: Insufficient documentation

## 2019-06-27 DIAGNOSIS — F329 Major depressive disorder, single episode, unspecified: Secondary | ICD-10-CM | POA: Insufficient documentation

## 2019-06-27 DIAGNOSIS — E11319 Type 2 diabetes mellitus with unspecified diabetic retinopathy without macular edema: Secondary | ICD-10-CM | POA: Insufficient documentation

## 2019-06-27 DIAGNOSIS — Z8673 Personal history of transient ischemic attack (TIA), and cerebral infarction without residual deficits: Secondary | ICD-10-CM | POA: Insufficient documentation

## 2019-06-27 DIAGNOSIS — H2701 Aphakia, right eye: Secondary | ICD-10-CM | POA: Diagnosis not present

## 2019-06-27 DIAGNOSIS — E785 Hyperlipidemia, unspecified: Secondary | ICD-10-CM | POA: Diagnosis not present

## 2019-06-27 DIAGNOSIS — I10 Essential (primary) hypertension: Secondary | ICD-10-CM | POA: Insufficient documentation

## 2019-06-27 DIAGNOSIS — H35033 Hypertensive retinopathy, bilateral: Secondary | ICD-10-CM | POA: Insufficient documentation

## 2019-06-27 DIAGNOSIS — Z8379 Family history of other diseases of the digestive system: Secondary | ICD-10-CM | POA: Insufficient documentation

## 2019-06-27 DIAGNOSIS — Z9842 Cataract extraction status, left eye: Secondary | ICD-10-CM | POA: Insufficient documentation

## 2019-06-27 DIAGNOSIS — Z9841 Cataract extraction status, right eye: Secondary | ICD-10-CM | POA: Diagnosis present

## 2019-06-27 HISTORY — DX: Diarrhea, unspecified: R19.7

## 2019-06-27 HISTORY — DX: Pneumonia, unspecified organism: J18.9

## 2019-06-27 HISTORY — PX: PLACEMENT AND SUTURE OF SECONDARY INTRAOCULAR LENS: SHX5338

## 2019-06-27 HISTORY — DX: Unspecified osteoarthritis, unspecified site: M19.90

## 2019-06-27 HISTORY — PX: PARS PLANA VITRECTOMY: SHX2166

## 2019-06-27 HISTORY — DX: Anxiety disorder, unspecified: F41.9

## 2019-06-27 HISTORY — DX: Personal history of urinary calculi: Z87.442

## 2019-06-27 LAB — CBC
HCT: 49.4 % (ref 39.0–52.0)
Hemoglobin: 16 g/dL (ref 13.0–17.0)
MCH: 29.9 pg (ref 26.0–34.0)
MCHC: 32.4 g/dL (ref 30.0–36.0)
MCV: 92.3 fL (ref 80.0–100.0)
Platelets: 242 10*3/uL (ref 150–400)
RBC: 5.35 MIL/uL (ref 4.22–5.81)
RDW: 13.4 % (ref 11.5–15.5)
WBC: 6.9 10*3/uL (ref 4.0–10.5)
nRBC: 0 % (ref 0.0–0.2)

## 2019-06-27 LAB — BASIC METABOLIC PANEL
Anion gap: 11 (ref 5–15)
BUN: 15 mg/dL (ref 8–23)
CO2: 21 mmol/L — ABNORMAL LOW (ref 22–32)
Calcium: 8.9 mg/dL (ref 8.9–10.3)
Chloride: 104 mmol/L (ref 98–111)
Creatinine, Ser: 1.22 mg/dL (ref 0.61–1.24)
GFR calc Af Amer: >60 mL/min (ref >60)
GFR calc non Af Amer: >60 mL/min (ref >60)
Glucose, Bld: 112 mg/dL — ABNORMAL HIGH (ref 70–99)
Potassium: 4.3 mmol/L (ref 3.5–5.1)
Sodium: 136 mmol/L (ref 135–145)

## 2019-06-27 LAB — GLUCOSE, CAPILLARY
Glucose-Capillary: 133 mg/dL — ABNORMAL HIGH (ref 70–99)
Glucose-Capillary: 114 mg/dL — ABNORMAL HIGH (ref 70–99)

## 2019-06-27 SURGERY — PLACEMENT AND SUTURE OF SECONDARY INTRAOCULAR LENS
Anesthesia: General | Site: Eye | Laterality: Right

## 2019-06-27 MED ORDER — EPINEPHRINE PF 1 MG/ML IJ SOLN
INTRAOCULAR | Status: DC | PRN
  Administered 2019-06-27: 500 mL

## 2019-06-27 MED ORDER — TROPICAMIDE 1 % OP SOLN
1.0000 [drp] | OPHTHALMIC | Status: AC | PRN
  Administered 2019-06-27 (×2): 1 [drp] via OPHTHALMIC

## 2019-06-27 MED ORDER — MIDAZOLAM HCL 5 MG/5ML IJ SOLN
INTRAMUSCULAR | Status: DC | PRN
  Administered 2019-06-27 (×2): 1 mg via INTRAVENOUS

## 2019-06-27 MED ORDER — SODIUM CHLORIDE (PF) 0.9 % IJ SOLN
INTRAMUSCULAR | Status: AC
  Filled 2019-06-27: qty 10

## 2019-06-27 MED ORDER — BRIMONIDINE TARTRATE 0.2 % OP SOLN
OPHTHALMIC | Status: AC
  Filled 2019-06-27: qty 5

## 2019-06-27 MED ORDER — PHENYLEPHRINE HCL 10 % OP SOLN
OPHTHALMIC | Status: AC
  Administered 2019-06-27: 11:00:00 1 [drp] via OPHTHALMIC
  Filled 2019-06-27: qty 5

## 2019-06-27 MED ORDER — PHENYLEPHRINE HCL-NACL 10-0.9 MG/250ML-% IV SOLN
INTRAVENOUS | Status: DC | PRN
  Administered 2019-06-27: 50 ug/min via INTRAVENOUS

## 2019-06-27 MED ORDER — MIDAZOLAM HCL 2 MG/2ML IJ SOLN
INTRAMUSCULAR | Status: AC
  Filled 2019-06-27: qty 2

## 2019-06-27 MED ORDER — ONDANSETRON HCL 4 MG/2ML IJ SOLN
INTRAMUSCULAR | Status: DC | PRN
  Administered 2019-06-27: 4 mg via INTRAVENOUS

## 2019-06-27 MED ORDER — STERILE WATER FOR INJECTION IJ SOLN
INTRAMUSCULAR | Status: AC
  Filled 2019-06-27: qty 10

## 2019-06-27 MED ORDER — GATIFLOXACIN 0.5 % OP SOLN OPTIME - NO CHARGE
OPHTHALMIC | Status: DC | PRN
  Administered 2019-06-27: 1 [drp] via OPHTHALMIC

## 2019-06-27 MED ORDER — PREDNISOLONE ACETATE 1 % OP SUSP
OPHTHALMIC | Status: DC | PRN
  Administered 2019-06-27: 1 [drp] via OPHTHALMIC

## 2019-06-27 MED ORDER — EPINEPHRINE PF 1 MG/ML IJ SOLN
INTRAMUSCULAR | Status: AC
  Filled 2019-06-27: qty 1

## 2019-06-27 MED ORDER — ROCURONIUM BROMIDE 10 MG/ML (PF) SYRINGE
PREFILLED_SYRINGE | INTRAVENOUS | Status: AC
  Filled 2019-06-27: qty 10

## 2019-06-27 MED ORDER — SODIUM CHLORIDE 0.9 % IV SOLN: INTRAVENOUS | Status: DC

## 2019-06-27 MED ORDER — ROCURONIUM BROMIDE 10 MG/ML (PF) SYRINGE
PREFILLED_SYRINGE | INTRAVENOUS | Status: AC
  Filled 2019-06-27: qty 20

## 2019-06-27 MED ORDER — PROPOFOL 10 MG/ML IV BOLUS
INTRAVENOUS | Status: DC | PRN
  Administered 2019-06-27: 120 mg via INTRAVENOUS

## 2019-06-27 MED ORDER — DEXAMETHASONE SODIUM PHOSPHATE 10 MG/ML IJ SOLN
INTRAMUSCULAR | Status: DC | PRN
  Administered 2019-06-27: 5 mg via INTRAVENOUS

## 2019-06-27 MED ORDER — TRIAMCINOLONE ACETONIDE 40 MG/ML IJ SUSP
INTRAMUSCULAR | Status: AC
  Filled 2019-06-27: qty 5

## 2019-06-27 MED ORDER — BSS PLUS IO SOLN
INTRAOCULAR | Status: AC
  Filled 2019-06-27: qty 500

## 2019-06-27 MED ORDER — NA CHONDROIT SULF-NA HYALURON 40-30 MG/ML IO SOLN
INTRAOCULAR | Status: DC | PRN
  Administered 2019-06-27 (×2): 0.5 mL via INTRAOCULAR

## 2019-06-27 MED ORDER — STERILE WATER FOR IRRIGATION IR SOLN
Status: DC | PRN
  Administered 2019-06-27: 1000 mL

## 2019-06-27 MED ORDER — SUGAMMADEX SODIUM 200 MG/2ML IV SOLN
INTRAVENOUS | Status: DC | PRN
  Administered 2019-06-27: 250 mg via INTRAVENOUS

## 2019-06-27 MED ORDER — DORZOLAMIDE HCL-TIMOLOL MAL 2-0.5 % OP SOLN
OPHTHALMIC | Status: AC
  Filled 2019-06-27: qty 10

## 2019-06-27 MED ORDER — GATIFLOXACIN 0.5 % OP SOLN
OPHTHALMIC | Status: AC
  Filled 2019-06-27: qty 2.5

## 2019-06-27 MED ORDER — ATROPINE SULFATE 1 % OP SOLN
OPHTHALMIC | Status: AC
  Administered 2019-06-27: 1 [drp] via OPHTHALMIC
  Filled 2019-06-27: qty 5

## 2019-06-27 MED ORDER — PROPARACAINE HCL 0.5 % OP SOLN
1.0000 [drp] | OPHTHALMIC | Status: AC | PRN
  Administered 2019-06-27 (×2): 1 [drp] via OPHTHALMIC

## 2019-06-27 MED ORDER — ROCURONIUM BROMIDE 10 MG/ML (PF) SYRINGE
PREFILLED_SYRINGE | INTRAVENOUS | Status: DC | PRN
  Administered 2019-06-27 (×4): 20 mg via INTRAVENOUS
  Administered 2019-06-27: 60 mg via INTRAVENOUS

## 2019-06-27 MED ORDER — FENTANYL CITRATE (PF) 250 MCG/5ML IJ SOLN
INTRAMUSCULAR | Status: DC | PRN
  Administered 2019-06-27: 50 ug via INTRAVENOUS
  Administered 2019-06-27: 100 ug via INTRAVENOUS

## 2019-06-27 MED ORDER — POLYMYXIN B SULFATE 500000 UNITS IJ SOLR
INTRAMUSCULAR | Status: AC
  Filled 2019-06-27: qty 500000

## 2019-06-27 MED ORDER — STERILE WATER FOR INJECTION IJ SOLN
INTRAMUSCULAR | Status: DC | PRN
  Administered 2019-06-27: 20 mL

## 2019-06-27 MED ORDER — LABETALOL HCL 5 MG/ML IV SOLN
INTRAVENOUS | Status: AC
  Filled 2019-06-27: qty 4

## 2019-06-27 MED ORDER — ONDANSETRON HCL 4 MG/2ML IJ SOLN
INTRAMUSCULAR | Status: AC
  Filled 2019-06-27: qty 2

## 2019-06-27 MED ORDER — PROPOFOL 10 MG/ML IV BOLUS
INTRAVENOUS | Status: AC
  Filled 2019-06-27: qty 40

## 2019-06-27 MED ORDER — 0.9 % SODIUM CHLORIDE (POUR BTL) OPTIME
TOPICAL | Status: DC | PRN
  Administered 2019-06-27: 11:00:00 1000 mL

## 2019-06-27 MED ORDER — GLYCOPYRROLATE PF 0.2 MG/ML IJ SOSY
PREFILLED_SYRINGE | INTRAMUSCULAR | Status: AC
  Filled 2019-06-27: qty 2

## 2019-06-27 MED ORDER — PREDNISOLONE ACETATE 1 % OP SUSP
OPHTHALMIC | Status: AC
  Filled 2019-06-27: qty 5

## 2019-06-27 MED ORDER — LIDOCAINE 2% (20 MG/ML) 5 ML SYRINGE
INTRAMUSCULAR | Status: AC
  Filled 2019-06-27: qty 10

## 2019-06-27 MED ORDER — CYCLOPENTOLATE HCL 1 % OP SOLN
OPHTHALMIC | Status: AC
  Administered 2019-06-27: 11:00:00 1 [drp] via OPHTHALMIC
  Filled 2019-06-27: qty 2

## 2019-06-27 MED ORDER — BSS IO SOLN
INTRAOCULAR | Status: DC | PRN
  Administered 2019-06-27: 15 mL via INTRAOCULAR

## 2019-06-27 MED ORDER — TROPICAMIDE 1 % OP SOLN
OPHTHALMIC | Status: AC
  Administered 2019-06-27: 1 [drp] via OPHTHALMIC
  Filled 2019-06-27: qty 15

## 2019-06-27 MED ORDER — NA CHONDROIT SULF-NA HYALURON 40-30 MG/ML IO SOLN
INTRAOCULAR | Status: AC
  Filled 2019-06-27: qty 1

## 2019-06-27 MED ORDER — BACITRACIN-POLYMYXIN B 500-10000 UNIT/GM OP OINT
TOPICAL_OINTMENT | OPHTHALMIC | Status: AC
  Filled 2019-06-27: qty 3.5

## 2019-06-27 MED ORDER — BUPIVACAINE HCL (PF) 0.75 % IJ SOLN
INTRAMUSCULAR | Status: AC
  Filled 2019-06-27: qty 10

## 2019-06-27 MED ORDER — CEFTAZIDIME 1 G IJ SOLR
INTRAMUSCULAR | Status: AC
  Filled 2019-06-27: qty 1

## 2019-06-27 MED ORDER — LABETALOL HCL 5 MG/ML IV SOLN
10.0000 mg | INTRAVENOUS | Status: AC | PRN
  Administered 2019-06-27 (×2): 10 mg via INTRAVENOUS

## 2019-06-27 MED ORDER — LIDOCAINE HCL 2 % IJ SOLN
INTRAMUSCULAR | Status: AC
  Filled 2019-06-27: qty 20

## 2019-06-27 MED ORDER — BSS IO SOLN
INTRAOCULAR | Status: AC
  Filled 2019-06-27: qty 15

## 2019-06-27 MED ORDER — DEXAMETHASONE SODIUM PHOSPHATE 10 MG/ML IJ SOLN
INTRAMUSCULAR | Status: AC
  Filled 2019-06-27: qty 1

## 2019-06-27 MED ORDER — GLYCOPYRROLATE 0.2 MG/ML IJ SOLN
INTRAMUSCULAR | Status: DC | PRN
  Administered 2019-06-27 (×2): .2 mg via INTRAVENOUS

## 2019-06-27 MED ORDER — ALBUTEROL SULFATE HFA 108 (90 BASE) MCG/ACT IN AERS
INHALATION_SPRAY | RESPIRATORY_TRACT | Status: DC | PRN
  Administered 2019-06-27: 2 via RESPIRATORY_TRACT

## 2019-06-27 MED ORDER — LIDOCAINE 2% (20 MG/ML) 5 ML SYRINGE
INTRAMUSCULAR | Status: DC | PRN
  Administered 2019-06-27: 50 mg via INTRAVENOUS

## 2019-06-27 MED ORDER — HYALURONIDASE HUMAN 150 UNIT/ML IJ SOLN
INTRAMUSCULAR | Status: AC
  Filled 2019-06-27: qty 1

## 2019-06-27 MED ORDER — CYCLOPENTOLATE HCL 1 % OP SOLN
1.0000 [drp] | OPHTHALMIC | Status: AC | PRN
  Administered 2019-06-27 (×2): 1 [drp] via OPHTHALMIC

## 2019-06-27 MED ORDER — BACITRACIN-POLYMYXIN B 500-10000 UNIT/GM OP OINT
TOPICAL_OINTMENT | OPHTHALMIC | Status: DC | PRN
  Administered 2019-06-27: 1 via OPHTHALMIC

## 2019-06-27 MED ORDER — PHENYLEPHRINE HCL 10 % OP SOLN
1.0000 [drp] | OPHTHALMIC | Status: AC | PRN
  Administered 2019-06-27 (×2): 1 [drp] via OPHTHALMIC

## 2019-06-27 MED ORDER — ATROPINE SULFATE 1 % OP SOLN
OPHTHALMIC | Status: DC | PRN
  Administered 2019-06-27: 1 [drp] via OPHTHALMIC

## 2019-06-27 MED ORDER — BRIMONIDINE TARTRATE 0.2 % OP SOLN
OPHTHALMIC | Status: DC | PRN
  Administered 2019-06-27: 1 [drp] via OPHTHALMIC

## 2019-06-27 MED ORDER — ATROPINE SULFATE 1 % OP SOLN
1.0000 [drp] | OPHTHALMIC | Status: AC | PRN
  Administered 2019-06-27 (×2): 1 [drp] via OPHTHALMIC

## 2019-06-27 MED ORDER — FENTANYL CITRATE (PF) 250 MCG/5ML IJ SOLN
INTRAMUSCULAR | Status: AC
  Filled 2019-06-27: qty 5

## 2019-06-27 MED ORDER — DORZOLAMIDE HCL-TIMOLOL MAL 2-0.5 % OP SOLN
OPHTHALMIC | Status: DC | PRN
  Administered 2019-06-27: 1 [drp] via OPHTHALMIC

## 2019-06-27 MED ORDER — PROPARACAINE HCL 0.5 % OP SOLN
OPHTHALMIC | Status: AC
  Administered 2019-06-27: 1 [drp] via OPHTHALMIC
  Filled 2019-06-27: qty 15

## 2019-06-27 MED ORDER — TRIAMCINOLONE ACETONIDE 40 MG/ML IJ SUSP
INTRAMUSCULAR | Status: DC | PRN
  Administered 2019-06-27: 40 mg

## 2019-06-27 SURGICAL SUPPLY — 67 items
APPLICATOR COTTON TIP 6 STRL (MISCELLANEOUS) ×4 IMPLANT
APPLICATOR COTTON TIP 6IN STRL (MISCELLANEOUS) ×12
BAND WRIST GAS GREEN (MISCELLANEOUS) IMPLANT
BNDG EYE OVAL (GAUZE/BANDAGES/DRESSINGS) ×6 IMPLANT
CABLE BIPOLOR RESECTION CORD (MISCELLANEOUS) ×3 IMPLANT
CANNULA ANT CHAM MAIN (OPHTHALMIC RELATED) ×3 IMPLANT
CANNULA ANTERIOR CHAMBER 27GA (MISCELLANEOUS) ×3 IMPLANT
CANNULA FLEX TIP 25G (CANNULA) ×3 IMPLANT
CANNULA VLV SOFT TIP 25GA (OPHTHALMIC) ×3 IMPLANT
CLOSURE STERI-STRIP 1/2X4 (GAUZE/BANDAGES/DRESSINGS) ×1
CLSR STERI-STRIP ANTIMIC 1/2X4 (GAUZE/BANDAGES/DRESSINGS) ×2 IMPLANT
DRAPE MICROSCOPE LEICA 46X105 (MISCELLANEOUS) ×3 IMPLANT
DRAPE OPHTHALMIC 77X100 STRL (CUSTOM PROCEDURE TRAY) ×3 IMPLANT
ERASER HMR WETFIELD 23G BP (MISCELLANEOUS) IMPLANT
FILTER BLUE MILLIPORE (MISCELLANEOUS) IMPLANT
FILTER STRAW FLUID ASPIR (MISCELLANEOUS) IMPLANT
FORCEPS GRIESHABER ILM 25G A (INSTRUMENTS) IMPLANT
GAS AUTO FILL CONSTEL (OPHTHALMIC)
GAS AUTO FILL CONSTELLATION (OPHTHALMIC) IMPLANT
GAS WRIST BAND GREEN (MISCELLANEOUS)
GLOVE BIO SURGEON STRL SZ7.5 (GLOVE) ×6 IMPLANT
GLOVE BIOGEL M 7.0 STRL (GLOVE) ×3 IMPLANT
GOWN STRL REUS W/ TWL LRG LVL3 (GOWN DISPOSABLE) ×3 IMPLANT
GOWN STRL REUS W/ TWL XL LVL3 (GOWN DISPOSABLE) ×1 IMPLANT
GOWN STRL REUS W/TWL LRG LVL3 (GOWN DISPOSABLE) ×9
GOWN STRL REUS W/TWL XL LVL3 (GOWN DISPOSABLE) ×3
KIT BASIN OR (CUSTOM PROCEDURE TRAY) ×3 IMPLANT
KIT PERFLUORON PROCEDURE 5ML (MISCELLANEOUS) IMPLANT
LENS IOL AKREOS 18.0 (Intraocular Lens) ×3 IMPLANT
LENS MACULAR ASPHERIC CONSTEL (OPHTHALMIC) IMPLANT
LENS VITRECTOMY FLAT OCLR DISP (MISCELLANEOUS) IMPLANT
LOOP FINESSE 25 GA (MISCELLANEOUS) IMPLANT
MICROPICK 25G (MISCELLANEOUS)
NEEDLE 18GX1X1/2 (RX/OR ONLY) (NEEDLE) ×9 IMPLANT
NEEDLE 25GX 5/8IN NON SAFETY (NEEDLE) ×12 IMPLANT
NEEDLE HYPO 30X.5 LL (NEEDLE) ×6 IMPLANT
NEEDLE PRECISIONGLIDE 27X1.5 (NEEDLE) IMPLANT
NS IRRIG 1000ML POUR BTL (IV SOLUTION) ×3 IMPLANT
PACK VITRECTOMY CUSTOM (CUSTOM PROCEDURE TRAY) ×3 IMPLANT
PAD ARMBOARD 7.5X6 YLW CONV (MISCELLANEOUS) ×6 IMPLANT
PAK PIK VITRECTOMY CVS 25GA (OPHTHALMIC) ×3 IMPLANT
PENCIL BIPOLAR 25GA STR DISP (OPHTHALMIC RELATED) ×3 IMPLANT
PICK MICROPICK 25G (MISCELLANEOUS) IMPLANT
PROBE DIATHERMY DSP 27GA (MISCELLANEOUS) IMPLANT
PROBE ENDO DIATHERMY 25G (MISCELLANEOUS) ×3 IMPLANT
PROBE LASER ILLUM FLEX CVD 25G (OPHTHALMIC) ×3 IMPLANT
REPL STRA BRUSH NEEDLE (NEEDLE) IMPLANT
RESERVOIR BACK FLUSH (MISCELLANEOUS) IMPLANT
RETRACTOR IRIS FLEX 25G GRIESH (INSTRUMENTS) ×3 IMPLANT
SET INJECTOR OIL FLUID CONSTEL (OPHTHALMIC) IMPLANT
SHIELD EYE LENSE ONLY DISP (GAUZE/BANDAGES/DRESSINGS) ×3 IMPLANT
SPEAR EYE SURG WECK-CEL (MISCELLANEOUS) ×9 IMPLANT
SPONGE SURGIFOAM ABS GEL 12-7 (HEMOSTASIS) IMPLANT
STOPCOCK 4 WAY LG BORE MALE ST (IV SETS) IMPLANT
SUT ETHILON 10 0 CS140 6 (SUTURE) ×3 IMPLANT
SUT GTX CV-8 8.0 TTC-7 24IN (MISCELLANEOUS) ×3 IMPLANT
SUT VICRYL 7 0 TG140 8 (SUTURE) ×6 IMPLANT
SYR 10ML LL (SYRINGE) ×3 IMPLANT
SYR 20ML LL LF (SYRINGE) ×6 IMPLANT
SYR 5ML LL (SYRINGE) ×3 IMPLANT
SYR BULB 3OZ (MISCELLANEOUS) IMPLANT
SYR TB 1ML LUER SLIP (SYRINGE) ×12 IMPLANT
TAPE SURG TRANSPORE 1 IN (GAUZE/BANDAGES/DRESSINGS) ×1 IMPLANT
TAPE SURGICAL TRANSPORE 1 IN (GAUZE/BANDAGES/DRESSINGS) ×3
TOWEL GREEN STERILE FF (TOWEL DISPOSABLE) ×3 IMPLANT
TUBING HIGH PRESS EXTEN 6IN (TUBING) ×3 IMPLANT
WATER STERILE IRR 1000ML POUR (IV SOLUTION) ×3 IMPLANT

## 2019-06-27 NOTE — Progress Notes (Signed)
Spoke with dr joslin/mda re; elevated sbp and dbp. Orders rec'd for meds

## 2019-06-27 NOTE — Anesthesia Procedure Notes (Signed)
Procedure Name: Intubation Date/Time: 06/27/2019 11:42 AM Performed by: Mariea Clonts, CRNA Pre-anesthesia Checklist: Patient identified, Emergency Drugs available, Suction available and Patient being monitored Patient Re-evaluated:Patient Re-evaluated prior to induction Oxygen Delivery Method: Circle System Utilized Preoxygenation: Pre-oxygenation with 100% oxygen Induction Type: IV induction Ventilation: Mask ventilation without difficulty Laryngoscope Size: Mac and 3 Grade View: Grade II Tube type: Oral Tube size: 7.5 mm Number of attempts: 1 Airway Equipment and Method: Stylet and Oral airway Placement Confirmation: ETT inserted through vocal cords under direct vision,  positive ETCO2 and breath sounds checked- equal and bilateral Tube secured with: Tape Dental Injury: Teeth and Oropharynx as per pre-operative assessment

## 2019-06-27 NOTE — Interval H&P Note (Signed)
History and Physical Interval Note:  06/27/2019 10:56 AM  Jonathan [name]  has presented today for surgery, with the diagnosis of APHAKIA.  The various methods of treatment have been discussed with the patient and family. After consideration of risks, benefits and other options for treatment, the patient has consented to  Procedure(s): PLACEMENT AND SUTURE OF SECONDARY INTRAOCULAR LENS (Right) PARS PLANA VITRECTOMY WITH 25 GAUGE (Right) as a surgical intervention.  The patient's history has been reviewed, patient examined, no change in status, stable for surgery.  I have reviewed the patient's chart and labs.  Questions were answered to the patient's satisfaction.     [name]

## 2019-06-27 NOTE — Op Note (Signed)
Date of Surgery: 3.4.2021   Pre-Op Diagnosis:  1. Aphakia, Right Eye 2. History of retinal detachment, Right Eye   Post-op Diagnosis:  Same   Procedure:  1. 25 gauge [place] Vitrectomy w/ endolaser, Right Eye 2. Secondary Intraocular Lens Insertion with Scleral Fixation, Right Eye   Surgeon: [name], MD,PhD   Assistant: Ernest Mallick, OA   Anesthesia: General anesthesia with endotracheal intubation   Estimated Blood Loss: <5 cc   Lens Implant: Akreos AO60 18.0 Diopter ([id number])   Complications: None   Description of the Procedure:  Patient was seen in the Pre-operative area. After all remaining questions were answered, the operative "Right Eye" was marked and the informed consent was confirmed.The patient was brought back to the operating room by the nursing staff. The patient was succesfully placed under general anesthesia.  A time-out was performed. All in attendance (the nursing staff, anesthesia staff, and ophthalmology staff) agreed upon the patient, type of surgery, and the location of surgery. The operative eye was prepped and draped in sterile fashion.    Of note, this patient previously underwent 25g PPV w/ IOL explantation and endolaser and gas in this same operative eye for a dislocated IOL and retinal detachment OD. First, a toric IOL marker was used to mark the Akreos IOL axis. Marks were made in the superonasal and inferotemporal quadrants of the cornea. Conjunctival peritomies were then performed from 3-6:00 and 9:00-12:00. Hemostasis was achieved on the bare sclera using wetfield cautery. The paired sclerotomy sites for the IOL implant were marked using calipers -- 3 mm posterior to limbus and 5 mm apart from each other about the corneal axis previously marked. Care was taken to avoid the previous trocar sites.  A valved trocar was placed in the inferonasal quadrant, 3.43mm posterior to the limbus. The infusion line was brought to the operative field and found  to be functional and free of air bubbles. The infusion line was inserted in this trocar, visualized with light pipe, and turned on. The infusion was secured with steristrips. A second valved trocar was placed in the superotemporal quadrant in a beveled fashion. The inferior IOL marking in the superonasal quadrant was used to place the 3rd trocar--straight in with bevel of blade parallel to the limbus.     Next, under the wide-field viewing system, a brief vitrectomy was performed with the assistance of kenalog staining. Again, as this was a previously vitrectomized eye, there was essentially no residual vitreous. Peripherally, the retina was attached with good 360 peripheral laser in place. There were no retinal tears on 360 degree scleral depression. Endolaser was used to place an additional row of prophylactic laser in the periphery just posterior to the previous laser spots.   Attention was then turned to the secondary IOL implantation. Anterior chamber paracenteses were created at 830 (temporal) and 3 (nasal). The [facility] was filled with viscoat to protect the corneal endothelium. The 10-0 nylon suture within the previously made superior triplanar corneal wound. This was the wound that was created to explant the dislocated IOL during the previous surgery. A 2.75 microkeratome blade was used to reaccess and reopen the triplanar wound at 12 oclock. Calipers measured this wound to be slightly greater than 3 mm in width.    Next, a 25 gauge trocar blade was then used to create sclerotomies without cannulas at the premarked sites 3 mm posterior to limbus and 5 mm apart from each other. The gortex suture was threaded through the eyelets of the  lens and externalized through the sclera utilizing matching pairs of MaxGrip forceps and a suture hand-off technique. The lens was then folded in half and placed in the eye. The superonasal trocar was removed over the Gortex suture and the sutures were tied and the lens was  centered. The knots were rotated into the sclera. The superior corneal wound was sutured closed with 2x 10-0 nylon suture. The nasal and temporal paracenteses were also sutured closed with 10-0 nylon sutures. Each corneal wound was hydrated to help create a water tight seal.   The sclerotomies for the lens were closed with 7.0 vicryl suture.The superior temporal trocar was removed and the sclerotomy was sutured closed with 7.0 vicryl suture. The infusion trocar was removed and sutured with 7-0 vicryl. The conjunctiva was closed with 7.0 vicryl suture.  Subtenon's antibiotics and Kenalog were injected. The lid speculum was removed. Antibiotic ointment and post-operative drops were placed. The eye was doubly patched shut and a shield was taped on top. The patient was transported from the operating room by the anesthesia staff and opthalmology staff having tolerated this procedure well and suffering no intraoperative complications.

## 2019-06-27 NOTE — Brief Op Note (Signed)
06/27/2019  3:11 PM  PATIENT:  Jonathan [name]  69 y.o. male  PRE-OPERATIVE DIAGNOSIS:  APHAKIA, right eye  POST-OPERATIVE DIAGNOSIS:  APHAKIA, right eye  PROCEDURE:  Procedure(s): PLACEMENT AND SUTURE OF SECONDARY INTRAOCULAR LENS (Right) PARS PLANA VITRECTOMY WITH 25 GAUGE (Right)  SURGEON:  Surgeon(s) and Role:    [name], MD - Primary  ASSISTANTS: Ernest Mallick, Ophthalmic Assistant  ANESTHESIA:   general  EBL:  2 mL   BLOOD ADMINISTERED:none  DRAINS: none   LOCAL MEDICATIONS USED:  NONE  SPECIMEN:  No Specimen  DISPOSITION OF SPECIMEN:  N/A  COUNTS:  YES  TOURNIQUET:  * No tourniquets in log *  DICTATION: .Note written in EPIC  PLAN OF CARE: Discharge to home after PACU  PATIENT DISPOSITION:  PACU - hemodynamically stable.   Delay start of Pharmacological VTE agent (>24hrs) due to surgical blood loss or risk of bleeding: not applicable

## 2019-06-27 NOTE — Discharge Instructions (Addendum)
POSTOPERATIVE INSTRUCTIONS  Your doctor has performed vitreoretinal surgery on you at [facility] [place] eye patched and shielded until seen by Dr. Coralyn Pear 815 AM tomorrow in clinic - Do not use drops until return - Sleep with head elevated to 30-45 degrees     - No strenuous bending, stooping or lifting.  - You may not drive until further notice.  - Tylenol or any other over-the-counter pain reliever can be used according to your doctor. If more pain medicine is required, your doctor will have a prescription for you.  - You may read, go up and down stairs, and watch television.    [name], M.D., Ph.D.

## 2019-06-27 NOTE — Transfer of Care (Signed)
Immediate Anesthesia Transfer of Care Note  Patient: Jonathan [name]  Procedure(s) Performed: PLACEMENT AND SUTURE OF SECONDARY INTRAOCULAR LENS (Right Eye) PARS PLANA VITRECTOMY WITH 25 GAUGE (Right Eye)  Patient Location: PACU  Anesthesia Type:General  Level of Consciousness: awake, alert  and oriented  Airway & Oxygen Therapy: Patient Spontanous Breathing and Patient connected to nasal cannula oxygen  Post-op Assessment: Report given to RN, Post -op Vital signs reviewed and stable and Patient moving all extremities X 4  Post vital signs: Reviewed and stable  Last Vitals:  Vitals Value Taken Time  BP    Temp    Pulse 71 06/27/19 1514  Resp 25 06/27/19 1514  SpO2 89 % 06/27/19 1514  Vitals shown include unvalidated device data.  Last Pain:  Vitals:   06/27/19 0913  TempSrc:   PainSc: 0-No pain      Patients Stated Pain Goal: 3 ([id number])  Complications: No apparent anesthesia complications

## 2019-06-27 NOTE — Anesthesia Preprocedure Evaluation (Addendum)
Anesthesia Evaluation  Patient identified by MRN, date of birth, ID band Patient awake    Reviewed: Allergy & Precautions, NPO status , Patient's Chart, lab work & pertinent test results  Airway Mallampati: II  TM Distance: >3 FB     Dental  (+) Edentulous Upper, Edentulous Lower   Pulmonary Current Smoker,    breath sounds clear to auscultation       Cardiovascular hypertension,  Rhythm:Regular Rate:Normal     Neuro/Psych    GI/Hepatic   Endo/Other  diabetes  Renal/GU      Musculoskeletal   Abdominal (+) + obese,   Peds  Hematology   Anesthesia Other Findings   Reproductive/Obstetrics                            Anesthesia Physical Anesthesia Plan  ASA: III  Anesthesia Plan: General   Post-op Pain Management:    Induction: Intravenous  PONV Risk Score and Plan: Ondansetron  Airway Management Planned: Oral ETT  Additional Equipment:   Intra-op Plan:   Post-operative Plan: Extubation in OR  Informed Consent: I have reviewed the patients History and Physical, chart, labs and discussed the procedure including the risks, benefits and alternatives for the proposed anesthesia with the patient or authorized representative who has indicated his/her understanding and acceptance.       Plan Discussed with: CRNA and Anesthesiologist  Anesthesia Plan Comments:         Anesthesia Quick Evaluation

## 2019-06-28 ENCOUNTER — Ambulatory Visit (INDEPENDENT_AMBULATORY_CARE_PROVIDER_SITE_OTHER): Payer: Medicare Other | Admitting: Ophthalmology

## 2019-06-28 ENCOUNTER — Encounter (INDEPENDENT_AMBULATORY_CARE_PROVIDER_SITE_OTHER): Payer: Self-pay | Admitting: Ophthalmology

## 2019-06-28 DIAGNOSIS — E119 Type 2 diabetes mellitus without complications: Secondary | ICD-10-CM

## 2019-06-28 DIAGNOSIS — T8522XS Displacement of intraocular lens, sequela: Secondary | ICD-10-CM

## 2019-06-28 DIAGNOSIS — H3321 Serous retinal detachment, right eye: Secondary | ICD-10-CM

## 2019-06-28 DIAGNOSIS — Z961 Presence of intraocular lens: Secondary | ICD-10-CM

## 2019-06-28 DIAGNOSIS — I10 Essential (primary) hypertension: Secondary | ICD-10-CM

## 2019-06-28 DIAGNOSIS — H3581 Retinal edema: Secondary | ICD-10-CM

## 2019-06-28 DIAGNOSIS — H35033 Hypertensive retinopathy, bilateral: Secondary | ICD-10-CM

## 2019-06-28 DIAGNOSIS — H3589 Other specified retinal disorders: Secondary | ICD-10-CM

## 2019-06-28 NOTE — Anesthesia Postprocedure Evaluation (Signed)
Anesthesia Post Note  Patient: Jonathan [name]  Procedure(s) Performed: PLACEMENT AND SUTURE OF SECONDARY INTRAOCULAR LENS (Right Eye) PARS PLANA VITRECTOMY WITH 25 GAUGE (Right Eye)     Patient location during evaluation: PACU Anesthesia Type: General Level of consciousness: awake and alert Pain management: pain level controlled Vital Signs Assessment: post-procedure vital signs reviewed and stable Respiratory status: spontaneous breathing, nonlabored ventilation, respiratory function stable and patient connected to nasal cannula oxygen Cardiovascular status: blood pressure returned to baseline and stable Postop Assessment: no apparent nausea or vomiting Anesthetic complications: no    Last Vitals:  Vitals:   06/27/19 1623 06/27/19 1625  BP:  (!) 182/88  Pulse: 64 64  Resp:    Temp:    SpO2:  97%    Last Pain:  Vitals:   06/27/19 1625  TempSrc:   PainSc: 4                  Theone Bowell COKER

## 2019-07-03 ENCOUNTER — Encounter: Payer: Self-pay | Admitting: *Deleted

## 2019-07-04 NOTE — Progress Notes (Addendum)
[facility] Note  07/05/2019     CHIEF COMPLAINT Patient presents for Retina Follow Up   HISTORY OF PRESENT ILLNESS: Jonathan [name] is a 69 y.o. male who presents to the clinic today for:   HPI    Retina Follow Up    Patient presents with  Other.  In right eye.  This started 1 week ago.  Severity is moderate.  I, the attending physician,  performed the HPI with the patient and updated documentation appropriately.          Comments    Patient here for 1 week retina follow up for Dislocated IOL OD. S/p sutured IOL 03.04. Patient states vision getting better. No eye pain.        Last edited by [name], MD on 07/05/2019 12:32 PM. (History)    pt states he is not in any pain and his visoin is slowly coming around, he states he is using PF q2h  Referring physician: [name], MD [place], [place] [id number]   HISTORICAL INFORMATION:   Selected notes from the MEDICAL RECORD NUMBER Referred by Dr. [name] for evaluation of dislocated IOL   CURRENT MEDICATIONS: Current Outpatient Medications (Ophthalmic Drugs)  Medication Sig  . bacitracin-polymyxin b (POLYSPORIN) ophthalmic ointment Place into the right eye at bedtime. Place a 1/2 inch ribbon of ointment into the lower eyelid.  Marland Kitchen brimonidine (ALPHAGAN) 0.2 % ophthalmic solution Place 1 drop into the right eye 2 (two) times daily.  . dorzolamide-timolol (COSOPT) 22.3-6.8 MG/ML ophthalmic solution Place 1 drop into the right eye 2 (two) times daily.  . prednisoLONE acetate (PRED FORTE) 1 % ophthalmic suspension Place 1 drop into the right eye every 2 (two) hours.   No current facility-administered medications for this visit. (Ophthalmic Drugs)   Current Outpatient Medications (Other)  Medication Sig  . acetaminophen (TYLENOL) 500 MG tablet Take 1,000 mg by mouth every 6 (six) hours as needed for moderate pain or headache.  Marland Kitchen amLODipine (NORVASC) 5 MG tablet Take 1 tablet (5 mg  total) by mouth daily.  Marland Kitchen atorvastatin (LIPITOR) 40 MG tablet Take 1 tablet (40 mg total) by mouth daily. (Patient not taking: Reported on 06/07/2019)  . Blood Glucose Monitoring Suppl (ONETOUCH VERIO) w/Device KIT Check blood sugar once per day  . buPROPion (WELLBUTRIN SR) 150 MG 12 hr tablet Take 1 tablet (150 mg total) by mouth 2 (two) times daily. (Patient not taking: Reported on 06/07/2019)  . clopidogrel (PLAVIX) 75 MG tablet Take 1 tablet (75 mg total) by mouth daily.  . fexofenadine (ALLEGRA ALLERGY) 180 MG tablet Take 1 tablet (180 mg total) by mouth daily. (Patient not taking: Reported on 06/07/2019)  . fluticasone (FLONASE) 50 MCG/ACT nasal spray Place 1 spray into both nostrils daily. (Patient not taking: Reported on 06/07/2019)  . glucose blood (ONETOUCH VERIO) test strip Check sugar once per day  . Lancet Devices (ONE TOUCH DELICA LANCING DEV) MISC Check sugar once per day  . lisinopril (ZESTRIL) 20 MG tablet Take 1 tablet (20 mg total) by mouth daily.  . metFORMIN (GLUCOPHAGE) 500 MG tablet Take 1 tablet (500 mg total) by mouth 2 (two) times daily with a meal.  . OneTouch Delica Lancets 36O MISC Check sugar once per day   No current facility-administered medications for this visit. (Other)      REVIEW OF SYSTEMS: ROS    Positive for: Neurological, HENT, Endocrine, Cardiovascular, Eyes, Respiratory   Negative  for: Constitutional, Gastrointestinal, Skin, Genitourinary, Musculoskeletal, Psychiatric, Allergic/Imm, Heme/Lymph   Last edited by [name], COA on 07/05/2019  9:05 AM. (History)       ALLERGIES No Known Allergies  PAST MEDICAL HISTORY Past Medical History:  Diagnosis Date  . Anxiety   . Arthritis   . COPD (chronic obstructive pulmonary disease) ([place])   . Depression   . Diabetes mellitus without complication ([place])   . Diarrhea   . History of kidney stones   . HTN (hypertension)   . Hyperlipidemia   . Hypertensive retinopathy    OU  . Pneumonia   .  Retinal detachment    Rheg. RD OD  . Sleep apnea    does not use cpap  . Stroke [facility])    denies any deficits   Past Surgical History:  Procedure Laterality Date  . CATARACT EXTRACTION Bilateral    Dr. [name]  . CIRCUMCISION    . EYE SURGERY Bilateral    Cat Sx OU; RD repair OD  . GAS INSERTION Right 03/13/2019   Procedure: INSERTION OF GAS (C3F8) RIGHT EYE;  Surgeon: [name], MD;  Location: [facility];  Service: Ophthalmology;  Laterality: Right;  . LASER PHOTO ABLATION Right 03/13/2019   Procedure: LASER PHOTO  ABLATION RIGHT EYE;  Surgeon: [name], MD;  Location: [facility];  Service: Ophthalmology;  Laterality: Right;  . none    . PARS PLANA VITRECTOMY Right 03/13/2019   Procedure: 25 GAUGE PARS PLANA VITRECTOMY WITH  INTRAOCULAR LENSE EXPLANTATION RIGHT EYE.;  Surgeon: [name], MD;  Location: [facility];  Service: Ophthalmology;  Laterality: Right;  . PARS PLANA VITRECTOMY Right 06/27/2019   Procedure: PARS PLANA VITRECTOMY WITH 25 GAUGE;  Surgeon: [name], MD;  Location: [facility];  Service: Ophthalmology;  Laterality: Right;  . PHOTOCOAGULATION WITH LASER Left 03/13/2019   Procedure: INDIRECT PHOTOCOAGULATION WITH LASER LEFT EYE;  Surgeon: [name], MD;  Location: [facility];  Service: Ophthalmology;  Laterality: Left;  . PLACEMENT AND SUTURE OF SECONDARY INTRAOCULAR LENS Right 06/27/2019   Procedure: PLACEMENT AND SUTURE OF SECONDARY INTRAOCULAR LENS;  Surgeon: [name], MD;  Location: [facility];  Service: Ophthalmology;  Laterality: Right;  . RETINAL DETACHMENT SURGERY Right 03/13/2019   PPV for repair of rheg. RD - Dr. [name]    FAMILY HISTORY Family History  Problem Relation Age of Onset  . Cancer Mother        brain tumor  . Heart disease Father   . Cirrhosis Father     SOCIAL HISTORY Social History   Tobacco Use  . Smoking status: Current Every Day Smoker    Packs/day: 1.00    Years: 43.00    Pack years: 43.00    Types: Cigarettes  . Smokeless  tobacco: Never Used  . Tobacco comment: pt is trying to aggressively cut back on # cigs smoked daily.   Substance Use Topics  . Alcohol use: No  . Drug use: No         OPHTHALMIC EXAM:  Base Eye Exam    Visual Acuity (Snellen - Linear)      Right Left   Dist [place] 20/250 -2 20/25 -1   Dist ph [place] 20/150 20/20 -1       Tonometry (Tonopen, 9:02 AM)      Right Left   Pressure 7 17       Pupils      Dark Light Shape React APD   Right 7 7 Round  NR None   Left 3 2 Round Brisk None       Visual Fields (Counting fingers)      Left Right    Full Full       Extraocular Movement      Right Left    Full, Ortho Full, Ortho       Neuro/Psych    Oriented x3: Yes   Mood/Affect: Normal       Dilation    Both eyes: 1.0% Mydriacyl, 2.5% Phenylephrine @ 9:01 AM        Slit Lamp and Fundus Exam    External Exam      Right Left   External Normal Normal       Slit Lamp Exam      Right Left   Lids/Lashes Dermatochalasis - upper lid, multiple papillomas, Telangiectasia, mild Meibomian gland dysfunction Dermatochalasis - upper lid, multiple papillomas, Telangiectasia, mild Meibomian gland dysfunction   Conjunctiva/Sclera Subconjunctival hemorrhage, sutures intact Pinguecula, trace temporal Injection   Cornea Arcus, well healed temporal cataract wound, 1+ Punctate epithelial erosions, 2+ Descemet's folds centrally, 2 nylon sutures at 1200, nylon sutures at 0300 and 0900, no epi defect Arcus, Debris in tear film, 2+Punctate epithelial erosions   Anterior Chamber Deep, 1-2+cell/pigment Deep and quiet   Iris Round and dilated Round and dilated   Lens Sutured Akreos IOL in excellent position Posterior chamber intraocular lens in good position   Vitreous post vitrectomy; trace pigment Vitreous syneresis       Fundus Exam      Right Left   Disc Pink and Sharp Pink and Sharp, mild temporal PPP   C/D Ratio 0.4 0.4   Macula flat, Blunted foveal reflex, Retinal pigment epithelial  mottling Flat, Blunted foveal reflex, No heme or edema   Vessels Vascular attenuation, Tortuous Vascular attenuation, very Tortuous, AV crossing changes   Periphery attached; good laser changes 360, new row of posterior laser; ORIGINALLY: Bullous superior detachment from 1000-130 with multiple tears at 1030 and 1100 Attached, paving stone degeneration inferiorly, retinal hole at 1200 and tears at 430 and 0730; good laser surrounding breaks at 1200, 0430 and 0730          IMAGING AND PROCEDURES  Imaging and Procedures for _0 @  OCT, Retina - OU - Both Eyes       Right Eye Quality was borderline. Central Foveal Thickness: 362. Progression has been stable. Findings include no IRF, no SRF, normal foveal contour.   Left Eye Quality was good. Central Foveal Thickness: 325. Progression has been stable. Findings include normal foveal contour, no IRF, no SRF, epiretinal membrane, macular pucker.   Notes *Images captured and stored on drive  Diagnosis / Impression:  OD: retina stably reattached -- NFP, no IRF/SRF OS: NFP, no IRF/SRF; +ERM w/ pucker--stable  Clinical management:  See below  Abbreviations: NFP - Normal foveal profile. CME - cystoid macular edema. PED - pigment epithelial detachment. IRF - intraretinal fluid. SRF - subretinal fluid. EZ - ellipsoid zone. ERM - epiretinal membrane. ORA - outer retinal atrophy. ORT - outer retinal tubulation. SRHM - subretinal hyper-reflective material                 ASSESSMENT/PLAN:    ICD-10-CM   1. Right retinal detachment  H33.21   2. Retinal edema  H35.81 OCT, Retina - OU - Both Eyes  3. Dislocation of intraocular lens, sequela  T85.22XS   4. Retinal breaks without detachment  H35.89   5. Essential hypertension  I10   6. Hypertensive retinopathy of both eyes  H35.033   7. Diabetes mellitus type 2 without retinopathy ([place])  E11.9   8. Pseudophakia of both eyes  Z96.1   9. Dislocation of intraocular lens, initial encounter   T85.22XA     1,2. Rhegmatogenous retinal detachment, right eye  - bullous superior mac off detachment, onset ~1 wk, with onset of foveal involvement Tuesday, 03/12/19 by pt history  - detached from 1000 to 0130, fovea off, multiple tears at 1030 and 1100  - s/p PPV/IOL explanation/EL/FAX/14% C3F8 OD, 11.18.20             - retina attached and in good position             - IOP okay at 7             - increase PF to q2h OD  3. Dislocated IOL OD  - s/p CE/IOL OD on 10.21.20 w/ Dr. [name]  - IOL displaced inferiorly with superior haptic hung up in capsular remnant   - poor dilation -- ?pseudoexfoliation, floppy iris OD  - developed macula/fovea involving RD as above  - s/p IOL explantation during RD repair 11.18.20 as above  - s/p secondary sutured Akreos IOL 03.04.21             - doing well this morning             - retina attached and in good position   - still with corneal edema             - IOP 7             - cont   PF q2h OD                         zymaxid QID OD -- stop when bottle runs out                         PSO ung QID OD -- QHS/PRN             - eye shield when sleeping              - post op drop and positioning instructions reviewed              - tylenol/ibuprofen for pain   - f/u 1 week  4. Retinal breaks without detachment OS  - scleral depressed exam of OS completed as part of RD work up  - large retinal break at 12, smaller flap tears at 0430 amd 0730  - s/p laser retinopexy OS (11.18.20) -- good laser around breaks at 1200, 0430 and 0730  5,6. Hypertensive retinopathy OU  - discussed importance of tight BP control  - monitor  7. Diabetes mellitus, type 2 without retinopathy  - The incidence, risk factors for progression, natural history and treatment options for diabetic retinopathy  were discussed with patient.    - The need for close monitoring of blood glucose, blood pressure, and serum lipids, avoiding cigarette or any type of tobacco, and the  need for long term follow up was also discussed with patient.  - f/u in 1 year, sooner prn  8. Pseudophakia OU  - s/p CE/IOL OU (Dr. [name], October 2020)  - subluxed IOL OD as above - s/p IOL explantation on 11.18.2020, sutured IOL on 3.4.21  - OS doing well  - monitor  Ophthalmic Meds Ordered this visit:  Meds ordered this encounter  Medications  . prednisoLONE acetate (PRED FORTE) 1 % ophthalmic suspension    Sig: Place 1 drop into the right eye every 2 (two) hours.    Dispense:  15 mL    Refill:  0  . bacitracin-polymyxin b (POLYSPORIN) ophthalmic ointment    Sig: Place into the right eye at bedtime. Place a 1/2 inch ribbon of ointment into the lower eyelid.    Dispense:  3.5 g    Refill:  3       Return in about 2 weeks (around 07/19/2019) for f/u dislocated IOL, DFE, OCT.  There are no Patient Instructions on file for this visit.   Explained the diagnoses, plan, and follow up with the patient and they expressed understanding.  Patient expressed understanding of the importance of proper follow up care.   This document serves as a record of services personally performed by Gardiner Sleeper, MD, PhD. It was created on their behalf by Ernest Mallick, OA, an ophthalmic assistant. The creation of this record is the provider's dictation and/or activities during the visit.    Electronically signed by: Ernest Mallick, OA 03.11.2021 12:35 PM   Gardiner Sleeper, M.D., Ph.D. Diseases & Surgery of the Retina and Vitreous [facility]  I have reviewed the above documentation for accuracy and completeness, and I agree with the above. Gardiner Sleeper, M.D., Ph.D. 07/05/19 12:35 PM   Abbreviations: M myopia (nearsighted); A astigmatism; H hyperopia (farsighted); P presbyopia; Mrx spectacle prescription;  CTL contact lenses; OD right eye; OS left eye; OU both eyes  XT exotropia; ET esotropia; PEK punctate epithelial keratitis; PEE punctate epithelial erosions; DES  dry eye syndrome; MGD meibomian gland dysfunction; ATs artificial tears; PFAT's preservative free artificial tears; [place] nuclear sclerotic cataract; PSC posterior subcapsular cataract; ERM epi-retinal membrane; PVD posterior vitreous detachment; RD retinal detachment; DM diabetes mellitus; DR diabetic retinopathy; NPDR non-proliferative diabetic retinopathy; PDR proliferative diabetic retinopathy; CSME clinically significant macular edema; DME diabetic macular edema; dbh dot blot hemorrhages; CWS cotton wool spot; POAG primary open angle glaucoma; C/D cup-to-disc ratio; HVF humphrey visual field; GVF goldmann visual field; OCT optical coherence tomography; IOP intraocular pressure; BRVO Branch retinal vein occlusion; CRVO central retinal vein occlusion; CRAO central retinal artery occlusion; BRAO branch retinal artery occlusion; RT retinal tear; SB scleral buckle; PPV pars plana vitrectomy; VH Vitreous hemorrhage; PRP panretinal laser photocoagulation; IVK intravitreal kenalog; VMT vitreomacular traction; MH Macular hole;  NVD neovascularization of the disc; NVE neovascularization elsewhere; AREDS age related eye disease study; ARMD age related macular degeneration; POAG primary open angle glaucoma; EBMD epithelial/anterior basement membrane dystrophy; ACIOL anterior chamber intraocular lens; IOL intraocular lens; PCIOL posterior chamber intraocular lens; Phaco/IOL phacoemulsification with intraocular lens placement; [place] photorefractive keratectomy; LASIK laser assisted in situ keratomileusis; HTN hypertension; DM diabetes mellitus; COPD chronic obstructive pulmonary disease

## 2019-07-05 ENCOUNTER — Other Ambulatory Visit: Payer: Self-pay

## 2019-07-05 ENCOUNTER — Encounter (INDEPENDENT_AMBULATORY_CARE_PROVIDER_SITE_OTHER): Payer: Self-pay | Admitting: Ophthalmology

## 2019-07-05 ENCOUNTER — Ambulatory Visit (INDEPENDENT_AMBULATORY_CARE_PROVIDER_SITE_OTHER): Payer: Medicare Other | Admitting: Ophthalmology

## 2019-07-05 DIAGNOSIS — H3581 Retinal edema: Secondary | ICD-10-CM | POA: Diagnosis not present

## 2019-07-05 DIAGNOSIS — T8522XS Displacement of intraocular lens, sequela: Secondary | ICD-10-CM

## 2019-07-05 DIAGNOSIS — E119 Type 2 diabetes mellitus without complications: Secondary | ICD-10-CM

## 2019-07-05 DIAGNOSIS — I10 Essential (primary) hypertension: Secondary | ICD-10-CM

## 2019-07-05 DIAGNOSIS — H3321 Serous retinal detachment, right eye: Secondary | ICD-10-CM

## 2019-07-05 DIAGNOSIS — H3589 Other specified retinal disorders: Secondary | ICD-10-CM

## 2019-07-05 DIAGNOSIS — Z961 Presence of intraocular lens: Secondary | ICD-10-CM

## 2019-07-05 DIAGNOSIS — H35033 Hypertensive retinopathy, bilateral: Secondary | ICD-10-CM

## 2019-07-05 DIAGNOSIS — T8522XA Displacement of intraocular lens, initial encounter: Secondary | ICD-10-CM

## 2019-07-05 MED ORDER — BACITRACIN-POLYMYXIN B 500-10000 UNIT/GM OP OINT: TOPICAL_OINTMENT | Freq: Every day | OPHTHALMIC | 3 refills | Status: DC

## 2019-07-05 MED ORDER — PREDNISOLONE ACETATE 1 % OP SUSP: 1.0000 [drp] | OPHTHALMIC | 0 refills | Status: DC

## 2019-07-12 NOTE — Progress Notes (Signed)
[facility] Note  07/19/2019     CHIEF COMPLAINT Patient presents for Retina Follow Up   HISTORY OF PRESENT ILLNESS: Jonathan [name] is a 69 y.o. male who presents to the clinic today for:   HPI    Retina Follow Up    Patient presents with  Other.  In right eye.  This started 2 weeks ago.  Severity is moderate.  I, the attending physician,  performed the HPI with the patient and updated documentation appropriately.          Comments    Patient here for 2 weeks retina follow up for s/p secondary sutured IOL OD. Patient states vision getting better. Long way from 100%. No eye pain. Using drops Pink top, beige top and ung.       Last edited by [name], MD on 07/19/2019  9:33 AM. (History)    pt states vision improving OD.   Referring physician: [name], MD [place], [place] [id number]   HISTORICAL INFORMATION:   Selected notes from the MEDICAL RECORD NUMBER Referred by Dr. [name] for evaluation of dislocated IOL   CURRENT MEDICATIONS: Current Outpatient Medications (Ophthalmic Drugs)  Medication Sig  . bacitracin-polymyxin b (POLYSPORIN) ophthalmic ointment Place into the right eye at bedtime. Place a 1/2 inch ribbon of ointment into the lower eyelid.  Marland Kitchen brimonidine (ALPHAGAN) 0.2 % ophthalmic solution Place 1 drop into the right eye 2 (two) times daily.  . dorzolamide-timolol (COSOPT) 22.3-6.8 MG/ML ophthalmic solution Place 1 drop into the right eye 2 (two) times daily.  . prednisoLONE acetate (PRED FORTE) 1 % ophthalmic suspension Place 1 drop into the right eye every 2 (two) hours.   No current facility-administered medications for this visit. (Ophthalmic Drugs)   Current Outpatient Medications (Other)  Medication Sig  . acetaminophen (TYLENOL) 500 MG tablet Take 1,000 mg by mouth every 6 (six) hours as needed for moderate pain or headache.  Marland Kitchen amLODipine (NORVASC) 5 MG tablet Take 1 tablet (5 mg total) by mouth daily.   Marland Kitchen atorvastatin (LIPITOR) 40 MG tablet Take 1 tablet (40 mg total) by mouth daily. (Patient not taking: Reported on 06/07/2019)  . Blood Glucose Monitoring Suppl (ONETOUCH VERIO) w/Device KIT Check blood sugar once per day  . buPROPion (WELLBUTRIN SR) 150 MG 12 hr tablet Take 1 tablet (150 mg total) by mouth 2 (two) times daily. (Patient not taking: Reported on 06/07/2019)  . clopidogrel (PLAVIX) 75 MG tablet Take 1 tablet (75 mg total) by mouth daily.  . fexofenadine (ALLEGRA ALLERGY) 180 MG tablet Take 1 tablet (180 mg total) by mouth daily. (Patient not taking: Reported on 06/07/2019)  . fluticasone (FLONASE) 50 MCG/ACT nasal spray Place 1 spray into both nostrils daily. (Patient not taking: Reported on 06/07/2019)  . glucose blood (ONETOUCH VERIO) test strip Check sugar once per day  . Lancet Devices (ONE TOUCH DELICA LANCING DEV) MISC Check sugar once per day  . lisinopril (ZESTRIL) 20 MG tablet Take 1 tablet (20 mg total) by mouth daily.  . metFORMIN (GLUCOPHAGE) 500 MG tablet Take 1 tablet (500 mg total) by mouth 2 (two) times daily with a meal.  . OneTouch Delica Lancets 25O MISC Check sugar once per day   No current facility-administered medications for this visit. (Other)      REVIEW OF SYSTEMS: ROS    Positive for: Neurological, HENT, Endocrine, Cardiovascular, Eyes, Respiratory   Negative for: Constitutional, Gastrointestinal, Skin, Genitourinary, Musculoskeletal,  Psychiatric, Allergic/Imm, Heme/Lymph   Last edited by [name], COA on 07/19/2019  8:55 AM. (History)       ALLERGIES No Known Allergies  PAST MEDICAL HISTORY Past Medical History:  Diagnosis Date  . Anxiety   . Arthritis   . COPD (chronic obstructive pulmonary disease) ([place])   . Depression   . Diabetes mellitus without complication ([place])   . Diarrhea   . History of kidney stones   . HTN (hypertension)   . Hyperlipidemia   . Hypertensive retinopathy    OU  . Pneumonia   . Retinal detachment     Rheg. RD OD  . Sleep apnea    does not use cpap  . Stroke [facility])    denies any deficits   Past Surgical History:  Procedure Laterality Date  . CATARACT EXTRACTION Bilateral    Dr. [name]  . CIRCUMCISION    . EYE SURGERY Bilateral    Cat Sx OU; RD repair OD  . GAS INSERTION Right 03/13/2019   Procedure: INSERTION OF GAS (C3F8) RIGHT EYE;  Surgeon: [name], MD;  Location: [place];  Service: Ophthalmology;  Laterality: Right;  . LASER PHOTO ABLATION Right 03/13/2019   Procedure: LASER PHOTO  ABLATION RIGHT EYE;  Surgeon: [name], MD;  Location: [place];  Service: Ophthalmology;  Laterality: Right;  . none    . PARS PLANA VITRECTOMY Right 03/13/2019   Procedure: 25 GAUGE PARS PLANA VITRECTOMY WITH  INTRAOCULAR LENSE EXPLANTATION RIGHT EYE.;  Surgeon: [name], MD;  Location: [place];  Service: Ophthalmology;  Laterality: Right;  . PARS PLANA VITRECTOMY Right 06/27/2019   Procedure: PARS PLANA VITRECTOMY WITH 25 GAUGE;  Surgeon: [name], MD;  Location: [place];  Service: Ophthalmology;  Laterality: Right;  . PHOTOCOAGULATION WITH LASER Left 03/13/2019   Procedure: INDIRECT PHOTOCOAGULATION WITH LASER LEFT EYE;  Surgeon: [name], MD;  Location: [place];  Service: Ophthalmology;  Laterality: Left;  . PLACEMENT AND SUTURE OF SECONDARY INTRAOCULAR LENS Right 06/27/2019   Procedure: PLACEMENT AND SUTURE OF SECONDARY INTRAOCULAR LENS;  Surgeon: [name], MD;  Location: [place];  Service: Ophthalmology;  Laterality: Right;  . RETINAL DETACHMENT SURGERY Right 03/13/2019   PPV for repair of rheg. RD - Dr. [name]    FAMILY HISTORY Family History  Problem Relation Age of Onset  . Cancer Mother        brain tumor  . Heart disease Father   . Cirrhosis Father     SOCIAL HISTORY Social History   Tobacco Use  . Smoking status: Current Every Day Smoker    Packs/day: 1.00    Years: 43.00    Pack years: 43.00    Types: Cigarettes  . Smokeless tobacco: Never Used  .  Tobacco comment: pt is trying to aggressively cut back on # cigs smoked daily.   Substance Use Topics  . Alcohol use: No  . Drug use: No         OPHTHALMIC EXAM:  Base Eye Exam    Visual Acuity (Snellen - Linear)      Right Left   Dist [place] 20/80 -2 20/20 -2   Dist ph [place] 20/80 +2        Tonometry (Tonopen, 8:50 AM)      Right Left   Pressure 19 13       Pupils      Dark Light Shape React APD   Right 7 7 Round NR None   Left 3  2 Round Brisk None       Visual Fields (Counting fingers)      Left Right    Full Full       Extraocular Movement      Right Left    Full, Ortho Full, Ortho       Neuro/Psych    Oriented x3: Yes   Mood/Affect: Normal       Dilation    Both eyes: 1.0% Mydriacyl, 2.5% Phenylephrine @ 8:50 AM        Slit Lamp and Fundus Exam    External Exam      Right Left   External Normal Normal       Slit Lamp Exam      Right Left   Lids/Lashes Dermatochalasis - upper lid, multiple papillomas, Telangiectasia, mild Meibomian gland dysfunction Dermatochalasis - upper lid, multiple papillomas, Telangiectasia, mild Meibomian gland dysfunction   Conjunctiva/Sclera Subconjunctival hemorrhage--improved, sutures intact Pinguecula, trace temporal Injection   Cornea Arcus, well healed temporal cataract wound, trace Punctate epithelial erosions, edema improved with Descemet's folds resolved, 2 nylon sutures at 1200, nylon sutures at 0300 and 0900, no epi defect Arcus, Debris in tear film, 2+Punctate epithelial erosions   Anterior Chamber Deep, 1/2+cell/pigment Deep and quiet   Iris Round and dilated Round and dilated   Lens Sutured Akreos IOL in excellent position Posterior chamber intraocular lens in good position   Vitreous post vitrectomy; trace pigment Vitreous syneresis       Fundus Exam      Right Left   Disc Pink and Sharp Pink and Sharp, mild temporal PPP   C/D Ratio 0.4 0.4   Macula flat, Blunted foveal reflex, Retinal pigment epithelial  mottling Flat, Blunted foveal reflex, No heme or edema   Vessels Vascular attenuation, Tortuous Vascular attenuation, very Tortuous, AV crossing changes   Periphery attached; good laser changes 360, ORIGINALLY: Bullous superior detachment from 1000-130 with multiple tears at 1030 and 1100 Attached, paving stone degeneration inferiorly, retinal hole at 1200 and tears at 430 and 0730; good laser surrounding breaks at 1200, 0430 and 0730          IMAGING AND PROCEDURES  Imaging and Procedures for _0 @  OCT, Retina - OU - Both Eyes       Right Eye Quality was good. Central Foveal Thickness: 299. Progression has been stable. Findings include no IRF, no SRF, normal foveal contour, macular pucker (Nasal macular pucker).   Left Eye Quality was good. Central Foveal Thickness: 321. Progression has been stable. Findings include normal foveal contour, no IRF, no SRF, epiretinal membrane, macular pucker.   Notes *Images captured and stored on drive  Diagnosis / Impression:  OD: retina stably reattached -- NFP, no IRF/SRF, nasal macular pucker OS: NFP, no IRF/SRF; +ERM w/ pucker--stable  Clinical management:  See below  Abbreviations: NFP - Normal foveal profile. CME - cystoid macular edema. PED - pigment epithelial detachment. IRF - intraretinal fluid. SRF - subretinal fluid. EZ - ellipsoid zone. ERM - epiretinal membrane. ORA - outer retinal atrophy. ORT - outer retinal tubulation. SRHM - subretinal hyper-reflective material                 ASSESSMENT/PLAN:    ICD-10-CM   1. Right retinal detachment  H33.21   2. Retinal edema  H35.81 OCT, Retina - OU - Both Eyes  3. Dislocation of intraocular lens, sequela  T85.22XS   4. Retinal breaks without detachment  H35.89   5. Essential hypertension  I10   6. Hypertensive retinopathy of both eyes  H35.033   7. Diabetes mellitus type 2 without retinopathy ([place])  E11.9   8. Pseudophakia of both eyes  Z96.1     1,2. Rhegmatogenous  retinal detachment, right eye  - bullous superior mac off detachment, onset ~1 wk, with onset of foveal involvement Tuesday, 03/12/19 by pt history  - detached from 1000 to 0130, fovea off, multiple tears at 1030 and 1100  - s/p PPV/IOL explanation/EL/FAX/14% C3F8 OD, 11.18.20             - retina attached and in good position  3. Dislocated IOL OD  - s/p CE/IOL OD on 10.21.20 w/ Dr. [name]  - IOL displaced inferiorly with superior haptic hung up in capsular remnant   - poor dilation -- ?pseudoexfoliation, floppy iris OD  - developed macula/fovea involving RD as above  - s/p IOL explantation during RD repair 11.18.20 as above  - s/p secondary sutured Akreos IOL 03.04.21             - doing well with vision improved to 20/80 today -- corneal edema improving             - retina attached and in good position             - IOP 19             - reduce PF to QID OD  - cont PSO ung QHS/PRN  - f/u 4 weeks, DFE, OCT, check MRX  4. Retinal breaks without detachment OS  - scleral depressed exam of OS completed as part of RD work up  - large retinal break at 12, smaller flap tears at 0430 amd 0730  - s/p laser retinopexy OS (11.18.20) -- good laser around breaks at 1200, 0430 and 0730  5,6. Hypertensive retinopathy OU  - discussed importance of tight BP control  - monitor  7. Diabetes mellitus, type 2 without retinopathy  - The incidence, risk factors for progression, natural history and treatment options for diabetic retinopathy  were discussed with patient.    - The need for close monitoring of blood glucose, blood pressure, and serum lipids, avoiding cigarette or any type of tobacco, and the need for long term follow up was also discussed with patient.  - f/u in 1 year, sooner prn  8. Pseudophakia OU  - s/p CE/IOL OU (Dr. [name], October 2020)  - subluxed IOL OD as above - s/p IOL explantation on 11.18.2020, sutured IOL on 3.4.21  - OS doing well  - monitor   Ophthalmic Meds  Ordered this visit:  No orders of the defined types were placed in this encounter.      Return in about 4 weeks (around 08/16/2019) for DFE, OCT, check MRX.  There are no Patient Instructions on file for this visit.   Explained the diagnoses, plan, and follow up with the patient and they expressed understanding.  Patient expressed understanding of the importance of proper follow up care.   This document serves as a record of services personally performed by Gardiner Sleeper, MD, PhD. It was created on their behalf by Roselee Nova, COMT. The creation of this record is the provider's dictation and/or activities during the visit.  Electronically signed by: Roselee Nova, COMT 07/19/19 3:53 PM   Gardiner Sleeper, M.D., Ph.D. Diseases & Surgery of the Retina and [place]  I have reviewed the above documentation for accuracy  and completeness, and I agree with the above. Gardiner Sleeper, M.D., Ph.D. 07/19/19 3:53 PM   Abbreviations: M myopia (nearsighted); A astigmatism; H hyperopia (farsighted); P presbyopia; Mrx spectacle prescription;  CTL contact lenses; OD right eye; OS left eye; OU both eyes  XT exotropia; ET esotropia; PEK punctate epithelial keratitis; PEE punctate epithelial erosions; DES dry eye syndrome; MGD meibomian gland dysfunction; ATs artificial tears; PFAT's preservative free artificial tears; [place] nuclear sclerotic cataract; PSC posterior subcapsular cataract; ERM epi-retinal membrane; PVD posterior vitreous detachment; RD retinal detachment; DM diabetes mellitus; DR diabetic retinopathy; NPDR non-proliferative diabetic retinopathy; PDR proliferative diabetic retinopathy; CSME clinically significant macular edema; DME diabetic macular edema; dbh dot blot hemorrhages; CWS cotton wool spot; POAG primary open angle glaucoma; C/D cup-to-disc ratio; HVF humphrey visual field; GVF goldmann visual field; OCT optical coherence tomography; IOP intraocular  pressure; BRVO Branch retinal vein occlusion; CRVO central retinal vein occlusion; CRAO central retinal artery occlusion; BRAO branch retinal artery occlusion; RT retinal tear; SB scleral buckle; PPV pars plana vitrectomy; VH Vitreous hemorrhage; PRP panretinal laser photocoagulation; IVK intravitreal kenalog; VMT vitreomacular traction; MH Macular hole;  NVD neovascularization of the disc; NVE neovascularization elsewhere; AREDS age related eye disease study; ARMD age related macular degeneration; POAG primary open angle glaucoma; EBMD epithelial/anterior basement membrane dystrophy; ACIOL anterior chamber intraocular lens; IOL intraocular lens; PCIOL posterior chamber intraocular lens; Phaco/IOL phacoemulsification with intraocular lens placement; [place] photorefractive keratectomy; LASIK laser assisted in situ keratomileusis; HTN hypertension; DM diabetes mellitus; COPD chronic obstructive pulmonary disease

## 2019-07-19 ENCOUNTER — Encounter (INDEPENDENT_AMBULATORY_CARE_PROVIDER_SITE_OTHER): Payer: Self-pay | Admitting: Ophthalmology

## 2019-07-19 ENCOUNTER — Other Ambulatory Visit: Payer: Self-pay

## 2019-07-19 ENCOUNTER — Ambulatory Visit (INDEPENDENT_AMBULATORY_CARE_PROVIDER_SITE_OTHER): Payer: Medicare Other | Admitting: Ophthalmology

## 2019-07-19 DIAGNOSIS — Z961 Presence of intraocular lens: Secondary | ICD-10-CM

## 2019-07-19 DIAGNOSIS — H3589 Other specified retinal disorders: Secondary | ICD-10-CM

## 2019-07-19 DIAGNOSIS — H3581 Retinal edema: Secondary | ICD-10-CM | POA: Diagnosis not present

## 2019-07-19 DIAGNOSIS — E119 Type 2 diabetes mellitus without complications: Secondary | ICD-10-CM

## 2019-07-19 DIAGNOSIS — I10 Essential (primary) hypertension: Secondary | ICD-10-CM

## 2019-07-19 DIAGNOSIS — H3321 Serous retinal detachment, right eye: Secondary | ICD-10-CM

## 2019-07-19 DIAGNOSIS — H35033 Hypertensive retinopathy, bilateral: Secondary | ICD-10-CM

## 2019-07-19 DIAGNOSIS — T8522XS Displacement of intraocular lens, sequela: Secondary | ICD-10-CM

## 2019-08-09 NOTE — Progress Notes (Signed)
[facility] Note  08/16/2019     CHIEF COMPLAINT Patient presents for Post-op Follow-up ,m                                           HISTORY OF PRESENT ILLNESS: Jonathan [name] is a 69 y.o. male who presents to the clinic today for:   HPI    Post-op Follow-up    In right eye.  Discomfort includes none.  I, the attending physician,  performed the HPI with the patient and updated documentation appropriately.          Comments    Patient states vision OD is improving.  Patient states it still appears as though hes looking through a pair of binoculars from the opposite side OD.  Patient denies eye pain or discomfort and denies any new or worsening floaters or fol OU.       Last edited by [name], MD on 08/16/2019 10:33 AM. (History)    pt states his vision is the same, he is using PF TID OD  Referring physician: [name], MD [place], [place] [address]   HISTORICAL INFORMATION:   Selected notes from the MEDICAL RECORD NUMBER Referred by Dr. [name] for evaluation of dislocated IOL   CURRENT MEDICATIONS: Current Outpatient Medications (Ophthalmic Drugs)  Medication Sig  . bacitracin-polymyxin b (POLYSPORIN) ophthalmic ointment Place into the right eye at bedtime. Place a 1/2 inch ribbon of ointment into the lower eyelid.  Marland Kitchen brimonidine (ALPHAGAN) 0.2 % ophthalmic solution Place 1 drop into the right eye 2 (two) times daily.  . dorzolamide-timolol (COSOPT) 22.3-6.8 MG/ML ophthalmic solution Place 1 drop into the right eye 2 (two) times daily.  . prednisoLONE acetate (PRED FORTE) 1 % ophthalmic suspension Place 1 drop into the right eye every 2 (two) hours.   No current facility-administered medications for this visit. (Ophthalmic Drugs)   Current Outpatient Medications (Other)  Medication Sig  . acetaminophen (TYLENOL) 500 MG tablet Take 1,000 mg by mouth every 6 (six) hours as needed for moderate pain or headache.  Marland Kitchen  amLODipine (NORVASC) 5 MG tablet Take 1 tablet (5 mg total) by mouth daily.  Marland Kitchen atorvastatin (LIPITOR) 40 MG tablet Take 1 tablet (40 mg total) by mouth daily. (Patient not taking: Reported on 06/07/2019)  . Blood Glucose Monitoring Suppl (ONETOUCH VERIO) w/Device KIT Check blood sugar once per day  . buPROPion (WELLBUTRIN SR) 150 MG 12 hr tablet Take 1 tablet (150 mg total) by mouth 2 (two) times daily. (Patient not taking: Reported on 06/07/2019)  . clopidogrel (PLAVIX) 75 MG tablet Take 1 tablet (75 mg total) by mouth daily.  . fexofenadine (ALLEGRA ALLERGY) 180 MG tablet Take 1 tablet (180 mg total) by mouth daily. (Patient not taking: Reported on 06/07/2019)  . fluticasone (FLONASE) 50 MCG/ACT nasal spray Place 1 spray into both nostrils daily. (Patient not taking: Reported on 06/07/2019)  . glucose blood (ONETOUCH VERIO) test strip Check sugar once per day  . Lancet Devices (ONE TOUCH DELICA LANCING DEV) MISC Check sugar once per day  . lisinopril (ZESTRIL) 20 MG tablet Take 1 tablet (20 mg total) by mouth daily.  . metFORMIN (GLUCOPHAGE) 500 MG tablet Take 1 tablet (500 mg total) by mouth 2 (two) times daily with a meal.  . OneTouch Delica Lancets 08Q MISC Check sugar  once per day   No current facility-administered medications for this visit. (Other)      REVIEW OF SYSTEMS: ROS    Positive for: Neurological, HENT, Endocrine, Cardiovascular, Eyes, Respiratory   Negative for: Constitutional, Gastrointestinal, Skin, Genitourinary, Musculoskeletal, Psychiatric, Allergic/Imm, Heme/Lymph   Last edited by Doneen Poisson on 08/16/2019  9:27 AM. (History)       ALLERGIES No Known Allergies  PAST MEDICAL HISTORY Past Medical History:  Diagnosis Date  . Anxiety   . Arthritis   . COPD (chronic obstructive pulmonary disease) ([place])   . Depression   . Diabetes mellitus without complication ([place])   . Diarrhea   . History of kidney stones   . HTN (hypertension)   . Hyperlipidemia   .  Hypertensive retinopathy    OU  . Pneumonia   . Retinal detachment    Rheg. RD OD  . Sleep apnea    does not use cpap  . Stroke [facility])    denies any deficits   Past Surgical History:  Procedure Laterality Date  . CATARACT EXTRACTION Bilateral    Dr. [name]  . CIRCUMCISION    . EYE SURGERY Bilateral    Cat Sx OU; RD repair OD  . GAS INSERTION Right 03/13/2019   Procedure: INSERTION OF GAS (C3F8) RIGHT EYE;  Surgeon: [name], MD;  Location: [place];  Service: Ophthalmology;  Laterality: Right;  . LASER PHOTO ABLATION Right 03/13/2019   Procedure: LASER PHOTO  ABLATION RIGHT EYE;  Surgeon: [name], MD;  Location: [place];  Service: Ophthalmology;  Laterality: Right;  . none    . PARS PLANA VITRECTOMY Right 03/13/2019   Procedure: 25 GAUGE PARS PLANA VITRECTOMY WITH  INTRAOCULAR LENSE EXPLANTATION RIGHT EYE.;  Surgeon: [name], MD;  Location: [place];  Service: Ophthalmology;  Laterality: Right;  . PARS PLANA VITRECTOMY Right 06/27/2019   Procedure: PARS PLANA VITRECTOMY WITH 25 GAUGE;  Surgeon: [name], MD;  Location: [place];  Service: Ophthalmology;  Laterality: Right;  . PHOTOCOAGULATION WITH LASER Left 03/13/2019   Procedure: INDIRECT PHOTOCOAGULATION WITH LASER LEFT EYE;  Surgeon: [name], MD;  Location: [place];  Service: Ophthalmology;  Laterality: Left;  . PLACEMENT AND SUTURE OF SECONDARY INTRAOCULAR LENS Right 06/27/2019   Procedure: PLACEMENT AND SUTURE OF SECONDARY INTRAOCULAR LENS;  Surgeon: [name], MD;  Location: [place];  Service: Ophthalmology;  Laterality: Right;  . RETINAL DETACHMENT SURGERY Right 03/13/2019   PPV for repair of rheg. RD - Dr. [name]    FAMILY HISTORY Family History  Problem Relation Age of Onset  . Cancer Mother        brain tumor  . Heart disease Father   . Cirrhosis Father     SOCIAL HISTORY Social History   Tobacco Use  . Smoking status: Current Every Day Smoker    Packs/day: 1.00    Years: 43.00    Pack  years: 43.00    Types: Cigarettes  . Smokeless tobacco: Never Used  . Tobacco comment: pt is trying to aggressively cut back on # cigs smoked daily.   Substance Use Topics  . Alcohol use: No  . Drug use: No         OPHTHALMIC EXAM:  Base Eye Exam    Visual Acuity (Snellen - Linear)      Right Left   Dist [place] 20/80 +2 20/20 -2   Dist ph [place] 20/50 -1        Tonometry (Tonopen, 9:40 AM)  Right Left   Pressure 22 18       Pupils      Dark Light Shape React APD   Right 7 6 Round Minimal 0   Left 5 4 Round Minimal 0       Visual Fields      Left Right    Full Full       Extraocular Movement      Right Left    Full Full       Neuro/Psych    Oriented x3: Yes   Mood/Affect: Normal       Dilation    Both eyes: 1.0% Mydriacyl, 2.5% Phenylephrine @ 9:40 AM        Slit Lamp and Fundus Exam    External Exam      Right Left   External Normal Normal       Slit Lamp Exam      Right Left   Lids/Lashes Dermatochalasis - upper lid, multiple papillomas, Telangiectasia, mild Meibomian gland dysfunction Dermatochalasis - upper lid, multiple papillomas, Telangiectasia, mild Meibomian gland dysfunction   Conjunctiva/Sclera Subconjunctival hemorrhage--improved, sutures intact Pinguecula, trace temporal Injection   Cornea Arcus, well healed temporal cataract wound, trace Punctate epithelial erosions, 2 nylon sutures at 1200 (mild Descemet's folds), nylon sutures at 0300 (mild Descemet's folds) and 0900, no epi defect Arcus, Debris in tear film, 2+Punctate epithelial erosions   Anterior Chamber Deep, 1/2+cell/pigment Deep and quiet   Iris Round and dilated Round and dilated   Lens Sutured Akreos IOL in excellent position Posterior chamber intraocular lens in good position   Vitreous post vitrectomy; trace pigment Vitreous syneresis       Fundus Exam      Right Left   Disc Pink and Sharp Pink and Sharp, mild temporal PPP   C/D Ratio 0.4 0.4   Macula flat, Blunted foveal  reflex, Retinal pigment epithelial mottling Flat, Blunted foveal reflex, No heme or edema   Vessels Vascular attenuation, Tortuous Vascular attenuation, very Tortuous, AV crossing changes   Periphery attached; good laser changes 360, ORIGINALLY: Bullous superior detachment from 1000-130 with multiple tears at 1030 and 1100 Attached, paving stone degeneration inferiorly, retinal hole at 1200 and tears at 430 and 0730; good laser surrounding breaks at 1200, 0430 and 0730        Refraction    Manifest Refraction      Sphere Cylinder Axis Dist VA   Right -1.75 +1.75 135 20/50-1   Left +0.25 +0.25 155 20/20       Manifest Refraction #2 (Auto)      Sphere Cylinder Axis Dist VA   Right -8.00 +8.50 139    Left +0.50 +0.25 156           IMAGING AND PROCEDURES  Imaging and Procedures for _0 @  OCT, Retina - OU - Both Eyes       Right Eye Quality was good. Central Foveal Thickness: 305. Progression has been stable. Findings include no IRF, no SRF, normal foveal contour, macular pucker (Nasal macular pucker).   Left Eye Quality was good. Central Foveal Thickness: 323. Progression has been stable. Findings include normal foveal contour, no IRF, no SRF, epiretinal membrane, macular pucker.   Notes *Images captured and stored on drive  Diagnosis / Impression:  OD: retina stably reattached -- NFP, no IRF/SRF, nasal macular pucker OS: NFP, no IRF/SRF; +ERM w/ pucker--stable  Clinical management:  See below  Abbreviations: NFP - Normal foveal profile. CME - cystoid macular edema.  PED - pigment epithelial detachment. IRF - intraretinal fluid. SRF - subretinal fluid. EZ - ellipsoid zone. ERM - epiretinal membrane. ORA - outer retinal atrophy. ORT - outer retinal tubulation. SRHM - subretinal hyper-reflective material                 ASSESSMENT/PLAN:    ICD-10-CM   1. Right retinal detachment  H33.21   2. Retinal edema  H35.81 OCT, Retina - OU - Both Eyes  3. Dislocation of  intraocular lens, sequela  T85.22XS   4. Retinal breaks without detachment  H35.89   5. Essential hypertension  I10   6. Hypertensive retinopathy of both eyes  H35.033   7. Diabetes mellitus type 2 without retinopathy ([place])  E11.9   8. Pseudophakia of both eyes  Z96.1     1,2. Rhegmatogenous retinal detachment, right eye  - bullous superior mac off detachment, onset ~1 wk, with onset of foveal involvement Tuesday, 03/12/19 by pt history  - detached from 1000 to 0130, fovea off, multiple tears at 1030 and 1100  - s/p PPV/IOL explanation/EL/FAX/14% C3F8 OD, 11.18.20             - retina attached and in good position  3. Dislocated IOL OD  - s/p CE/IOL OD on 10.21.20 w/ Dr. [name]  - IOL displaced inferiorly with superior haptic hung up in capsular remnant   - poor dilation -- ?pseudoexfoliation, floppy iris OD  - developed macula/fovea involving RD as above  - s/p IOL explantation during RD repair 11.18.20 as above  - s/p secondary sutured Akreos IOL 03.04.21             - doing well with vision improved to 20/50 today -- corneal edema improving  - 1200 sutures removed at slit lamp today, 04.23.21             - retina attached and in good position             - IOP 22             - cont PF to TID OD  - start zymaxid QID OD x5d  - f/u 3 weeks, DFE, OCT  4. Retinal breaks without detachment OS  - scleral depressed exam of OS completed as part of RD work up  - large retinal break at 12, smaller flap tears at 0430 amd 0730  - s/p laser retinopexy OS (11.18.20) -- good laser around breaks at 1200, 0430 and 0730  5,6. Hypertensive retinopathy OU  - discussed importance of tight BP control  - monitor  7. Diabetes mellitus, type 2 without retinopathy  - The incidence, risk factors for progression, natural history and treatment options for diabetic retinopathy  were discussed with patient.    - The need for close monitoring of blood glucose, blood pressure, and serum lipids, avoiding  cigarette or any type of tobacco, and the need for long term follow up was also discussed with patient.  - f/u in 1 year, sooner prn  8. Pseudophakia OU  - s/p CE/IOL OU (Dr. [name], October 2020)  - subluxed IOL OD as above - s/p IOL explantation on 11.18.2020, sutured IOL on 3.4.21  - OS doing well  - monitor   Ophthalmic Meds Ordered this visit:  No orders of the defined types were placed in this encounter.      Return in about 3 weeks (around 09/06/2019) for f/u dislocated IOL OD, DFE, OCT.  There are no Patient Instructions on  file for this visit.   Explained the diagnoses, plan, and follow up with the patient and they expressed understanding.  Patient expressed understanding of the importance of proper follow up care.   This document serves as a record of services personally performed by Gardiner Sleeper, MD, PhD. It was created on their behalf by Ernest Mallick, OA, an ophthalmic assistant. The creation of this record is the provider's dictation and/or activities during the visit.    Electronically signed by: Ernest Mallick, OA 04.16.2021 1:11 AM   Gardiner Sleeper, M.D., Ph.D. Diseases & Surgery of the Retina and Vitreous [facility]  I have reviewed the above documentation for accuracy and completeness, and I agree with the above. Gardiner Sleeper, M.D., Ph.D. 08/18/19 1:11 AM    Abbreviations: M myopia (nearsighted); A astigmatism; H hyperopia (farsighted); P presbyopia; Mrx spectacle prescription;  CTL contact lenses; OD right eye; OS left eye; OU both eyes  XT exotropia; ET esotropia; PEK punctate epithelial keratitis; PEE punctate epithelial erosions; DES dry eye syndrome; MGD meibomian gland dysfunction; ATs artificial tears; PFAT's preservative free artificial tears; [place] nuclear sclerotic cataract; PSC posterior subcapsular cataract; ERM epi-retinal membrane; PVD posterior vitreous detachment; RD retinal detachment; DM diabetes mellitus; DR diabetic  retinopathy; NPDR non-proliferative diabetic retinopathy; PDR proliferative diabetic retinopathy; CSME clinically significant macular edema; DME diabetic macular edema; dbh dot blot hemorrhages; CWS cotton wool spot; POAG primary open angle glaucoma; C/D cup-to-disc ratio; HVF humphrey visual field; GVF goldmann visual field; OCT optical coherence tomography; IOP intraocular pressure; BRVO Branch retinal vein occlusion; CRVO central retinal vein occlusion; CRAO central retinal artery occlusion; BRAO branch retinal artery occlusion; RT retinal tear; SB scleral buckle; PPV pars plana vitrectomy; VH Vitreous hemorrhage; PRP panretinal laser photocoagulation; IVK intravitreal kenalog; VMT vitreomacular traction; MH Macular hole;  NVD neovascularization of the disc; NVE neovascularization elsewhere; AREDS age related eye disease study; ARMD age related macular degeneration; POAG primary open angle glaucoma; EBMD epithelial/anterior basement membrane dystrophy; ACIOL anterior chamber intraocular lens; IOL intraocular lens; PCIOL posterior chamber intraocular lens; Phaco/IOL phacoemulsification with intraocular lens placement; [place] photorefractive keratectomy; LASIK laser assisted in situ keratomileusis; HTN hypertension; DM diabetes mellitus; COPD chronic obstructive pulmonary disease

## 2019-08-16 ENCOUNTER — Ambulatory Visit (INDEPENDENT_AMBULATORY_CARE_PROVIDER_SITE_OTHER): Payer: Medicare Other | Admitting: Ophthalmology

## 2019-08-16 ENCOUNTER — Encounter (INDEPENDENT_AMBULATORY_CARE_PROVIDER_SITE_OTHER): Payer: Self-pay | Admitting: Ophthalmology

## 2019-08-16 DIAGNOSIS — H3589 Other specified retinal disorders: Secondary | ICD-10-CM

## 2019-08-16 DIAGNOSIS — H35033 Hypertensive retinopathy, bilateral: Secondary | ICD-10-CM

## 2019-08-16 DIAGNOSIS — I10 Essential (primary) hypertension: Secondary | ICD-10-CM

## 2019-08-16 DIAGNOSIS — Z961 Presence of intraocular lens: Secondary | ICD-10-CM

## 2019-08-16 DIAGNOSIS — H3581 Retinal edema: Secondary | ICD-10-CM

## 2019-08-16 DIAGNOSIS — H3321 Serous retinal detachment, right eye: Secondary | ICD-10-CM

## 2019-08-16 DIAGNOSIS — E119 Type 2 diabetes mellitus without complications: Secondary | ICD-10-CM

## 2019-08-16 DIAGNOSIS — T8522XS Displacement of intraocular lens, sequela: Secondary | ICD-10-CM

## 2019-08-30 ENCOUNTER — Telehealth: Payer: Self-pay

## 2019-08-30 NOTE — Telephone Encounter (Signed)
Attempted to call patient at listed number.  Person answering phone claims not to know patient.  Jonathan [name], [name]

## 2019-08-30 NOTE — Telephone Encounter (Signed)
-----   Message from [place] Pink, [place] sent at 08/30/2019  9:26 AM EDT ----- Regarding: Cologuard Cologuard due

## 2019-09-03 NOTE — Progress Notes (Signed)
[facility] Note  09/06/2019     CHIEF COMPLAINT Patient presents for Retina Follow Up   HISTORY OF PRESENT ILLNESS: Jonathan [name] is a 69 y.o. male who presents to the clinic today for:   HPI    Retina Follow Up    Patient presents with  Retinal Break/Detachment.  In right eye.  This started weeks ago.  Severity is moderate.  Duration of weeks.  Since onset it is gradually improving.  I, the attending physician,  performed the HPI with the patient and updated documentation appropriately.          Comments    Pt states his vision is improving but slowly improving.  Patient denies eye pain or discomfort and denies any new or worsening floaters or fol OU.       Last edited by [name], MD on 09/06/2019 11:55 AM. (History)    pt states   Referring physician: [name], MD [place], [place] [address]   HISTORICAL INFORMATION:   Selected notes from the MEDICAL RECORD NUMBER Referred by Dr. [name] for evaluation of dislocated IOL   CURRENT MEDICATIONS: Current Outpatient Medications (Ophthalmic Drugs)  Medication Sig  . bacitracin-polymyxin b (POLYSPORIN) ophthalmic ointment Place into the right eye at bedtime. Place a 1/2 inch ribbon of ointment into the lower eyelid.  Marland Kitchen brimonidine (ALPHAGAN) 0.2 % ophthalmic solution Place 1 drop into the right eye 2 (two) times daily.  . dorzolamide-timolol (COSOPT) 22.3-6.8 MG/ML ophthalmic solution Place 1 drop into the right eye 2 (two) times daily.  . prednisoLONE acetate (PRED FORTE) 1 % ophthalmic suspension Place 1 drop into the right eye every 2 (two) hours.   No current facility-administered medications for this visit. (Ophthalmic Drugs)   Current Outpatient Medications (Other)  Medication Sig  . acetaminophen (TYLENOL) 500 MG tablet Take 1,000 mg by mouth every 6 (six) hours as needed for moderate pain or headache.  Marland Kitchen amLODipine (NORVASC) 5 MG tablet Take 1 tablet (5 mg total)  by mouth daily.  Marland Kitchen atorvastatin (LIPITOR) 40 MG tablet Take 1 tablet (40 mg total) by mouth daily. (Patient not taking: Reported on 06/07/2019)  . Blood Glucose Monitoring Suppl (ONETOUCH VERIO) w/Device KIT Check blood sugar once per day  . buPROPion (WELLBUTRIN SR) 150 MG 12 hr tablet Take 1 tablet (150 mg total) by mouth 2 (two) times daily. (Patient not taking: Reported on 06/07/2019)  . clopidogrel (PLAVIX) 75 MG tablet Take 1 tablet (75 mg total) by mouth daily.  . fexofenadine (ALLEGRA ALLERGY) 180 MG tablet Take 1 tablet (180 mg total) by mouth daily. (Patient not taking: Reported on 06/07/2019)  . fluticasone (FLONASE) 50 MCG/ACT nasal spray Place 1 spray into both nostrils daily. (Patient not taking: Reported on 06/07/2019)  . glucose blood (ONETOUCH VERIO) test strip Check sugar once per day  . Lancet Devices (ONE TOUCH DELICA LANCING DEV) MISC Check sugar once per day  . lisinopril (ZESTRIL) 20 MG tablet Take 1 tablet (20 mg total) by mouth daily.  . metFORMIN (GLUCOPHAGE) 500 MG tablet Take 1 tablet (500 mg total) by mouth 2 (two) times daily with a meal.  . OneTouch Delica Lancets 12I MISC Check sugar once per day   No current facility-administered medications for this visit. (Other)      REVIEW OF SYSTEMS: ROS    Positive for: Neurological, HENT, Endocrine, Cardiovascular, Eyes, Respiratory   Negative for: Constitutional, Gastrointestinal, Skin, Genitourinary, Musculoskeletal, Psychiatric,  Allergic/Imm, Heme/Lymph   Last edited by Doneen Poisson on 09/06/2019 10:21 AM. (History)       ALLERGIES No Known Allergies  PAST MEDICAL HISTORY Past Medical History:  Diagnosis Date  . Anxiety   . Arthritis   . COPD (chronic obstructive pulmonary disease) ([place])   . Depression   . Diabetes mellitus without complication ([place])   . Diarrhea   . History of kidney stones   . HTN (hypertension)   . Hyperlipidemia   . Hypertensive retinopathy    OU  . Pneumonia   . Retinal  detachment    Rheg. RD OD  . Sleep apnea    does not use cpap  . Stroke [facility])    denies any deficits   Past Surgical History:  Procedure Laterality Date  . CATARACT EXTRACTION Bilateral    Dr. [name]  . CIRCUMCISION    . EYE SURGERY Bilateral    Cat Sx OU; RD repair OD  . GAS INSERTION Right 03/13/2019   Procedure: INSERTION OF GAS (C3F8) RIGHT EYE;  Surgeon: [name], MD;  Location: [place];  Service: Ophthalmology;  Laterality: Right;  . LASER PHOTO ABLATION Right 03/13/2019   Procedure: LASER PHOTO  ABLATION RIGHT EYE;  Surgeon: [name], MD;  Location: [place];  Service: Ophthalmology;  Laterality: Right;  . none    . PARS PLANA VITRECTOMY Right 03/13/2019   Procedure: 25 GAUGE PARS PLANA VITRECTOMY WITH  INTRAOCULAR LENSE EXPLANTATION RIGHT EYE.;  Surgeon: [name], MD;  Location: [place];  Service: Ophthalmology;  Laterality: Right;  . PARS PLANA VITRECTOMY Right 06/27/2019   Procedure: PARS PLANA VITRECTOMY WITH 25 GAUGE;  Surgeon: [name], MD;  Location: [place];  Service: Ophthalmology;  Laterality: Right;  . PHOTOCOAGULATION WITH LASER Left 03/13/2019   Procedure: INDIRECT PHOTOCOAGULATION WITH LASER LEFT EYE;  Surgeon: [name], MD;  Location: [place];  Service: Ophthalmology;  Laterality: Left;  . PLACEMENT AND SUTURE OF SECONDARY INTRAOCULAR LENS Right 06/27/2019   Procedure: PLACEMENT AND SUTURE OF SECONDARY INTRAOCULAR LENS;  Surgeon: [name], MD;  Location: [place];  Service: Ophthalmology;  Laterality: Right;  . RETINAL DETACHMENT SURGERY Right 03/13/2019   PPV for repair of rheg. RD - Dr. [name]    FAMILY HISTORY Family History  Problem Relation Age of Onset  . Cancer Mother        brain tumor  . Heart disease Father   . Cirrhosis Father     SOCIAL HISTORY Social History   Tobacco Use  . Smoking status: Current Every Day Smoker    Packs/day: 1.00    Years: 43.00    Pack years: 43.00    Types: Cigarettes  . Smokeless tobacco:  Never Used  . Tobacco comment: pt is trying to aggressively cut back on # cigs smoked daily.   Substance Use Topics  . Alcohol use: No  . Drug use: No         OPHTHALMIC EXAM:  Base Eye Exam    Visual Acuity (Snellen - Linear)      Right Left   Dist [place] 20/150 -2 20/20   Dist ph [place] 20/50 -2    Correction: Glasses       Tonometry (Tonopen, 10:26 AM)      Right Left   Pressure 25 18       Pupils      Dark Light Shape React APD   Right 7 7 Round Minimal 0   Left 3  2 Round Brisk 0       Visual Fields      Left Right    Full Full       Extraocular Movement      Right Left    Full Full       Neuro/Psych    Oriented x3: Yes   Mood/Affect: Normal       Dilation    Both eyes: 1.0% Mydriacyl, 2.5% Phenylephrine @ 10:26 AM        Slit Lamp and Fundus Exam    External Exam      Right Left   External Normal Normal       Slit Lamp Exam      Right Left   Lids/Lashes Dermatochalasis - upper lid, multiple papillomas, Telangiectasia, mild Meibomian gland dysfunction Dermatochalasis - upper lid, multiple papillomas, Telangiectasia, mild Meibomian gland dysfunction   Conjunctiva/Sclera Subconjunctival hemorrhage--improved, sutures intact Pinguecula, trace temporal Injection   Cornea Arcus, well healed superior cataract wound, nylon sutures at 0300 (mild Descemet's folds) and 0900, no epi defect Arcus, Debris in tear film, 2+Punctate epithelial erosions   Anterior Chamber Deep, 1/2+cell/pigment Deep and quiet   Iris Round and dilated Round and dilated   Lens Sutured Akreos IOL in excellent position Posterior chamber intraocular lens in good position   Vitreous post vitrectomy; trace pigment Vitreous syneresis       Fundus Exam      Right Left   Disc Pink and Sharp Pink and Sharp, mild temporal PPP   C/D Ratio 0.4 0.4   Macula flat, Blunted foveal reflex, Retinal pigment epithelial mottling, mild Epiretinal membrane Flat, Blunted foveal reflex, No heme or edema    Vessels Vascular attenuation, Tortuous Vascular attenuation, very Tortuous, AV crossing changes   Periphery attached; good laser changes 360, ORIGINALLY: Bullous superior detachment from 1000-130 with multiple tears at 1030 and 1100 Attached, paving stone degeneration inferiorly, retinal hole at 1200 and tears at 430 and 0730; good laser surrounding breaks at 1200, 0430 and 0730          IMAGING AND PROCEDURES  Imaging and Procedures for _0 @  OCT, Retina - OU - Both Eyes       Right Eye Quality was good. Central Foveal Thickness: 304. Progression has been stable. Findings include no IRF, no SRF, normal foveal contour, macular pucker (Nasal macular pucker).   Left Eye Quality was good. Central Foveal Thickness: 319. Progression has been stable. Findings include normal foveal contour, no IRF, no SRF, epiretinal membrane, macular pucker.   Notes *Images captured and stored on drive  Diagnosis / Impression:  OD: retina stably reattached -- NFP, no IRF/SRF, nasal macular pucker OS: NFP, no IRF/SRF; +ERM w/ pucker--stable  Clinical management:  See below  Abbreviations: NFP - Normal foveal profile. CME - cystoid macular edema. PED - pigment epithelial detachment. IRF - intraretinal fluid. SRF - subretinal fluid. EZ - ellipsoid zone. ERM - epiretinal membrane. ORA - outer retinal atrophy. ORT - outer retinal tubulation. SRHM - subretinal hyper-reflective material                 ASSESSMENT/PLAN:    ICD-10-CM   1. Right retinal detachment  H33.21   2. Retinal edema  H35.81 OCT, Retina - OU - Both Eyes  3. Dislocation of intraocular lens, sequela  T85.22XS   4. Retinal breaks without detachment  H35.89   5. Essential hypertension  I10   6. Hypertensive retinopathy of both eyes  H35.033  7. Diabetes mellitus type 2 without retinopathy ([place])  E11.9   8. Pseudophakia of both eyes  Z96.1     1,2. Rhegmatogenous retinal detachment, right eye  - bullous superior mac off  detachment, onset ~1 wk, with onset of foveal involvement Tuesday, 03/12/19 by pt history  - detached from 1000 to 0130, fovea off, multiple tears at 1030 and 1100  - s/p PPV/IOL explanation/EL/FAX/14% C3F8 OD, 11.18.20             - retina attached and in good position  3. Dislocated IOL OD  - s/p CE/IOL OD on 10.21.20 w/ Dr. [name]  - IOL displaced inferiorly with superior haptic hung up in capsular remnant   - poor dilation -- ?pseudoexfoliation, floppy iris OD  - developed macula/fovea involving RD as above  - s/p IOL explantation during RD repair 11.18.20 as above  - s/p secondary sutured Akreos IOL 03.04.21             - doing well with vision stable at 20/50 today -- corneal edema improving  - 1200 sutures removed at slit lamp  04.23.21             - retina attached and in good position             - IOP 25             - start PF taper -- 2,1 drops daily, decrease every 2 weeks  - restart Cosopt BID OD  - f/u 4 weeks, DFE, OCT  4. Retinal breaks without detachment OS  - scleral depressed exam of OS completed as part of RD work up  - large retinal break at 12, smaller flap tears at 0430 amd 0730  - s/p laser retinopexy OS (11.18.20) -- good laser around breaks at 1200, 0430 and 0730  5,6. Hypertensive retinopathy OU  - discussed importance of tight BP control  - monitor  7. Diabetes mellitus, type 2 without retinopathy  - The incidence, risk factors for progression, natural history and treatment options for diabetic retinopathy  were discussed with patient.    - The need for close monitoring of blood glucose, blood pressure, and serum lipids, avoiding cigarette or any type of tobacco, and the need for long term follow up was also discussed with patient.  - f/u in 1 year, sooner prn  8. Pseudophakia OU  - s/p CE/IOL OU (Dr. [name], October 2020)  - subluxed IOL OD as above - s/p IOL explantation on 11.18.2020, sutured IOL on 3.4.21  - OS doing well  -  monitor   Ophthalmic Meds Ordered this visit:  No orders of the defined types were placed in this encounter.      Return in about 4 weeks (around 10/04/2019) for f/u s/p secondary sutured IOL OD, DFE, OCT.  There are no Patient Instructions on file for this visit.   Explained the diagnoses, plan, and follow up with the patient and they expressed understanding.  Patient expressed understanding of the importance of proper follow up care.   This document serves as a record of services personally performed by Gardiner Sleeper, MD, PhD. It was created on their behalf by Ernest Mallick, OA, an ophthalmic assistant. The creation of this record is the provider's dictation and/or activities during the visit.    Electronically signed by: Ernest Mallick, OA 05.11.2021 2:39 PM   Gardiner Sleeper, M.D., Ph.D. Diseases & Surgery of the Retina and Vitreous [facility]  I have reviewed the above documentation for accuracy and completeness, and I agree with the above. Gardiner Sleeper, M.D., Ph.D. 09/10/19 2:39 PM    Abbreviations: M myopia (nearsighted); A astigmatism; H hyperopia (farsighted); P presbyopia; Mrx spectacle prescription;  CTL contact lenses; OD right eye; OS left eye; OU both eyes  XT exotropia; ET esotropia; PEK punctate epithelial keratitis; PEE punctate epithelial erosions; DES dry eye syndrome; MGD meibomian gland dysfunction; ATs artificial tears; PFAT's preservative free artificial tears; [place] nuclear sclerotic cataract; PSC posterior subcapsular cataract; ERM epi-retinal membrane; PVD posterior vitreous detachment; RD retinal detachment; DM diabetes mellitus; DR diabetic retinopathy; NPDR non-proliferative diabetic retinopathy; PDR proliferative diabetic retinopathy; CSME clinically significant macular edema; DME diabetic macular edema; dbh dot blot hemorrhages; CWS cotton wool spot; POAG primary open angle glaucoma; C/D cup-to-disc ratio; HVF humphrey visual field;  GVF goldmann visual field; OCT optical coherence tomography; IOP intraocular pressure; BRVO Branch retinal vein occlusion; CRVO central retinal vein occlusion; CRAO central retinal artery occlusion; BRAO branch retinal artery occlusion; RT retinal tear; SB scleral buckle; PPV pars plana vitrectomy; VH Vitreous hemorrhage; PRP panretinal laser photocoagulation; IVK intravitreal kenalog; VMT vitreomacular traction; MH Macular hole;  NVD neovascularization of the disc; NVE neovascularization elsewhere; AREDS age related eye disease study; ARMD age related macular degeneration; POAG primary open angle glaucoma; EBMD epithelial/anterior basement membrane dystrophy; ACIOL anterior chamber intraocular lens; IOL intraocular lens; PCIOL posterior chamber intraocular lens; Phaco/IOL phacoemulsification with intraocular lens placement; [place] photorefractive keratectomy; LASIK laser assisted in situ keratomileusis; HTN hypertension; DM diabetes mellitus; COPD chronic obstructive pulmonary disease

## 2019-09-06 ENCOUNTER — Ambulatory Visit (INDEPENDENT_AMBULATORY_CARE_PROVIDER_SITE_OTHER): Payer: Medicare Other | Admitting: Ophthalmology

## 2019-09-06 ENCOUNTER — Other Ambulatory Visit: Payer: Self-pay

## 2019-09-06 ENCOUNTER — Encounter (INDEPENDENT_AMBULATORY_CARE_PROVIDER_SITE_OTHER): Payer: Self-pay | Admitting: Ophthalmology

## 2019-09-06 DIAGNOSIS — H3321 Serous retinal detachment, right eye: Secondary | ICD-10-CM

## 2019-09-06 DIAGNOSIS — H3581 Retinal edema: Secondary | ICD-10-CM | POA: Diagnosis not present

## 2019-09-06 DIAGNOSIS — E119 Type 2 diabetes mellitus without complications: Secondary | ICD-10-CM

## 2019-09-06 DIAGNOSIS — H35033 Hypertensive retinopathy, bilateral: Secondary | ICD-10-CM

## 2019-09-06 DIAGNOSIS — Z961 Presence of intraocular lens: Secondary | ICD-10-CM

## 2019-09-06 DIAGNOSIS — T8522XS Displacement of intraocular lens, sequela: Secondary | ICD-10-CM

## 2019-09-06 DIAGNOSIS — T8522XA Displacement of intraocular lens, initial encounter: Secondary | ICD-10-CM

## 2019-09-06 DIAGNOSIS — I10 Essential (primary) hypertension: Secondary | ICD-10-CM

## 2019-09-06 DIAGNOSIS — H3589 Other specified retinal disorders: Secondary | ICD-10-CM

## 2019-10-07 NOTE — Progress Notes (Signed)
[facility] Note  10/10/2019     CHIEF COMPLAINT Patient presents for Retina Follow Up   HISTORY OF PRESENT ILLNESS: Jonathan [name] is a 69 y.o. male who presents to the clinic today for:   HPI    Retina Follow Up    Patient presents with  Other.  In right eye.  This started weeks ago.  Severity is moderate.  Duration of weeks.  Since onset it is gradually improving.  I, the attending physician,  performed the HPI with the patient and updated documentation appropriately.          Comments    Pt states his vision is improving OD but slowly improving and still blurry per patient.  Pt denies eye pain or discomfort and denies any new or worsening floaters or fol OU.       Last edited by [name], MD on 10/10/2019 10:01 AM. (History)    pt completed PF taper, he is still using Cosopt, but did not use it this morning  Referring physician: [name], MD [place], [place] [address]   HISTORICAL INFORMATION:   Selected notes from the MEDICAL RECORD NUMBER Referred by Dr. [name] for evaluation of dislocated IOL   CURRENT MEDICATIONS: Current Outpatient Medications (Ophthalmic Drugs)  Medication Sig  . bacitracin-polymyxin b (POLYSPORIN) ophthalmic ointment Place into the right eye at bedtime. Place a 1/2 inch ribbon of ointment into the lower eyelid.  Marland Kitchen brimonidine (ALPHAGAN) 0.2 % ophthalmic solution Place 1 drop into the right eye 2 (two) times daily.  . dorzolamide-timolol (COSOPT) 22.3-6.8 MG/ML ophthalmic solution Place 1 drop into the right eye 2 (two) times daily.  . prednisoLONE acetate (PRED FORTE) 1 % ophthalmic suspension Place 1 drop into the right eye every 2 (two) hours.   No current facility-administered medications for this visit. (Ophthalmic Drugs)   Current Outpatient Medications (Other)  Medication Sig  . acetaminophen (TYLENOL) 500 MG tablet Take 1,000 mg by mouth every 6 (six) hours as needed for moderate pain  or headache.  Marland Kitchen amLODipine (NORVASC) 5 MG tablet Take 1 tablet (5 mg total) by mouth daily.  Marland Kitchen atorvastatin (LIPITOR) 40 MG tablet Take 1 tablet (40 mg total) by mouth daily. (Patient not taking: Reported on 06/07/2019)  . Blood Glucose Monitoring Suppl (ONETOUCH VERIO) w/Device KIT Check blood sugar once per day  . buPROPion (WELLBUTRIN SR) 150 MG 12 hr tablet Take 1 tablet (150 mg total) by mouth 2 (two) times daily. (Patient not taking: Reported on 06/07/2019)  . clopidogrel (PLAVIX) 75 MG tablet Take 1 tablet (75 mg total) by mouth daily.  . fexofenadine (ALLEGRA ALLERGY) 180 MG tablet Take 1 tablet (180 mg total) by mouth daily. (Patient not taking: Reported on 06/07/2019)  . fluticasone (FLONASE) 50 MCG/ACT nasal spray Place 1 spray into both nostrils daily. (Patient not taking: Reported on 06/07/2019)  . glucose blood (ONETOUCH VERIO) test strip Check sugar once per day  . Lancet Devices (ONE TOUCH DELICA LANCING DEV) MISC Check sugar once per day  . lisinopril (ZESTRIL) 20 MG tablet Take 1 tablet (20 mg total) by mouth daily.  . metFORMIN (GLUCOPHAGE) 500 MG tablet Take 1 tablet (500 mg total) by mouth 2 (two) times daily with a meal.  . OneTouch Delica Lancets 42A MISC Check sugar once per day   No current facility-administered medications for this visit. (Other)      REVIEW OF SYSTEMS: ROS  Positive for: Neurological, HENT, Endocrine, Cardiovascular, Eyes, Respiratory   Negative for: Constitutional, Gastrointestinal, Skin, Genitourinary, Musculoskeletal, Psychiatric, Allergic/Imm, Heme/Lymph   Last edited by Doneen Poisson on 10/10/2019  9:24 AM. (History)       ALLERGIES No Known Allergies  PAST MEDICAL HISTORY Past Medical History:  Diagnosis Date  . Anxiety   . Arthritis   . COPD (chronic obstructive pulmonary disease) ([place])   . Depression   . Diabetes mellitus without complication ([place])   . Diarrhea   . History of kidney stones   . HTN (hypertension)   .  Hyperlipidemia   . Hypertensive retinopathy    OU  . Pneumonia   . Retinal detachment    Rheg. RD OD  . Sleep apnea    does not use cpap  . Stroke [facility])    denies any deficits   Past Surgical History:  Procedure Laterality Date  . CATARACT EXTRACTION Bilateral    Dr. [name]  . CIRCUMCISION    . EYE SURGERY Bilateral    Cat Sx OU; RD repair OD  . GAS INSERTION Right 03/13/2019   Procedure: INSERTION OF GAS (C3F8) RIGHT EYE;  Surgeon: [name], MD;  Location: [place];  Service: Ophthalmology;  Laterality: Right;  . LASER PHOTO ABLATION Right 03/13/2019   Procedure: LASER PHOTO  ABLATION RIGHT EYE;  Surgeon: [name], MD;  Location: [place];  Service: Ophthalmology;  Laterality: Right;  . none    . PARS PLANA VITRECTOMY Right 03/13/2019   Procedure: 25 GAUGE PARS PLANA VITRECTOMY WITH  INTRAOCULAR LENSE EXPLANTATION RIGHT EYE.;  Surgeon: [name], MD;  Location: [place];  Service: Ophthalmology;  Laterality: Right;  . PARS PLANA VITRECTOMY Right 06/27/2019   Procedure: PARS PLANA VITRECTOMY WITH 25 GAUGE;  Surgeon: [name], MD;  Location: [facility];  Service: Ophthalmology;  Laterality: Right;  . PHOTOCOAGULATION WITH LASER Left 03/13/2019   Procedure: INDIRECT PHOTOCOAGULATION WITH LASER LEFT EYE;  Surgeon: [name], MD;  Location: [facility];  Service: Ophthalmology;  Laterality: Left;  . PLACEMENT AND SUTURE OF SECONDARY INTRAOCULAR LENS Right 06/27/2019   Procedure: PLACEMENT AND SUTURE OF SECONDARY INTRAOCULAR LENS;  Surgeon: [name], MD;  Location: [facility];  Service: Ophthalmology;  Laterality: Right;  . RETINAL DETACHMENT SURGERY Right 03/13/2019   PPV for repair of rheg. RD - Dr. [name]    FAMILY HISTORY Family History  Problem Relation Age of Onset  . Cancer Mother        brain tumor  . Heart disease Father   . Cirrhosis Father     SOCIAL HISTORY Social History   Tobacco Use  . Smoking status: Current Every Day Smoker    Packs/day: 1.00     Years: 43.00    Pack years: 43.00    Types: Cigarettes  . Smokeless tobacco: Never Used  . Tobacco comment: pt is trying to aggressively cut back on # cigs smoked daily.   Vaping Use  . Vaping Use: Never used  Substance Use Topics  . Alcohol use: No  . Drug use: No         OPHTHALMIC EXAM:  Base Eye Exam    Visual Acuity (Snellen - Linear)      Right Left   Dist [place] 20/200 20/20 -1   Dist ph  20/60 -1        Tonometry (Tonopen, 9:30 AM)      Right Left   Pressure 30 15  Tonometry #2 (Applanation, 9:30 AM)      Right Left   Pressure 32        Tonometry #3 (Tonopen, 9:56 AM)      Right Left   Pressure 19 15       Tonometry Comments   Pt has not used cosopt this morning.  Pt given 1 gtt of cosopt @ 9:35 Pt given 1 gtt of brimonidine @ 9:36       Pupils      Dark Light Shape React APD   Right 6 5 Round Brisk 0   Left 5 4 Round Brisk 0       Visual Fields      Left Right    Full Full       Extraocular Movement      Right Left    Full Full       Neuro/Psych    Oriented x3: Yes   Mood/Affect: Normal       Dilation    Both eyes: 1.0% Mydriacyl, 2.5% Phenylephrine @ 9:37 AM        Slit Lamp and Fundus Exam    External Exam      Right Left   External Normal Normal       Slit Lamp Exam      Right Left   Lids/Lashes Dermatochalasis - upper lid, multiple papillomas, Telangiectasia, mild Meibomian gland dysfunction Dermatochalasis - upper lid, multiple papillomas, Telangiectasia, mild Meibomian gland dysfunction   Conjunctiva/Sclera Subconjunctival hemorrhage--improved, sutures intact Pinguecula, trace temporal Injection   Cornea Arcus, well healed superior cataract wound, nylon sutures at 0300 (mild Descemet's folds) and 0900, no epi defect, 1-2+ Punctate epithelial erosions, dry tear film Arcus, Debris in tear film, 2+Punctate epithelial erosions   Anterior Chamber Deep, 1/2+cell/pigment Deep and quiet   Iris Round and dilated Round and  dilated   Lens Sutured Akreos IOL in excellent position Posterior chamber intraocular lens in good position   Vitreous post vitrectomy; trace pigment Vitreous syneresis       Fundus Exam      Right Left   Disc Pink and Sharp Pink and Sharp, mild temporal PPP   C/D Ratio 0.4 0.4   Macula flat, Blunted foveal reflex, Retinal pigment epithelial mottling, mild Epiretinal membrane Flat, Blunted foveal reflex, No heme or edema   Vessels Vascular attenuation, Tortuous Vascular attenuation, very Tortuous, AV crossing changes   Periphery attached; good laser changes 360, ORIGINALLY: Bullous superior detachment from 1000-130 with multiple tears at 1030 and 1100 Attached, paving stone degeneration inferiorly, retinal hole at 1200 and tears at 430 and 0730; good laser surrounding breaks at 1200, 0430 and 0730        Refraction    Manifest Refraction      Sphere Cylinder Axis Dist VA   Right -3.00 +3.25 030 20/60+1   Left              IMAGING AND PROCEDURES  Imaging and Procedures for _0 @  OCT, Retina - OU - Both Eyes       Right Eye Quality was good. Central Foveal Thickness: 303. Progression has been stable. Findings include no IRF, no SRF, normal foveal contour, macular pucker (Nasal macular pucker - stable).   Left Eye Quality was good. Central Foveal Thickness: 315. Progression has been stable. Findings include normal foveal contour, no IRF, no SRF, epiretinal membrane, macular pucker.   Notes *Images captured and stored on drive  Diagnosis / Impression:  OD: retina stably  reattached -- NFP, no IRF/SRF, nasal macular pucker OS: NFP, no IRF/SRF; +ERM w/ pucker--stable  Clinical management:  See below  Abbreviations: NFP - Normal foveal profile. CME - cystoid macular edema. PED - pigment epithelial detachment. IRF - intraretinal fluid. SRF - subretinal fluid. EZ - ellipsoid zone. ERM - epiretinal membrane. ORA - outer retinal atrophy. ORT - outer retinal tubulation. SRHM -  subretinal hyper-reflective material                 ASSESSMENT/PLAN:    ICD-10-CM   1. Right retinal detachment  H33.21   2. Retinal edema  H35.81 OCT, Retina - OU - Both Eyes  3. Dislocation of intraocular lens, sequela  T85.22XS   4. Retinal breaks without detachment  H35.89   5. Essential hypertension  I10   6. Hypertensive retinopathy of both eyes  H35.033   7. Diabetes mellitus type 2 without retinopathy ([place])  E11.9   8. Pseudophakia of both eyes  Z96.1     1,2. Rhegmatogenous retinal detachment, right eye  - bullous superior mac off detachment, onset ~1 wk, with onset of foveal involvement Tuesday, 03/12/19 by pt history  - detached from 1000 to 0130, fovea off, multiple tears at 1030 and 1100  - s/p PPV/IOL explanation/EL/FAX/14% C3F8 OD, 11.18.20             - retina attached and in good position  3. Dislocated IOL OD  - s/p CE/IOL OD on 10.21.20 w/ Dr. [name]  - IOL displaced inferiorly with superior haptic hung up in capsular remnant   - poor dilation -- ?pseudoexfoliation, floppy iris OD  - developed macula/fovea involving RD as above  - s/p IOL explantation during RD repair 11.18.20 as above  - s/p secondary sutured Akreos IOL 03.04.21             - doing well with vision slightly decreased to 20/60 today  - 1200 sutures removed at slit lamp  04.23.21  - 0900 suture removed at slit lamp today -- 6.17.21             - retina attached and in good position             - IOP 32 initially today, 19 after 1 gtt brimonidine and 1 gtt Cosopt (pt did not use Cosopt this morning)             - completed PF taper  - contt Cosopt BID OD  - start Zymaxid qid x3 days post suture removal  - f/u 4 weeks, DFE, OCT -- possible suture removal  4. Retinal breaks without detachment OS  - scleral depressed exam of OS completed as part of RD work up  - large retinal break at 12, smaller flap tears at 0430 amd 0730  - s/p laser retinopexy OS (11.18.20) -- good laser around  breaks at 1200, 0430 and 0730  5,6. Hypertensive retinopathy OU  - discussed importance of tight BP control  - monitor  7. Diabetes mellitus, type 2 without retinopathy  - The incidence, risk factors for progression, natural history and treatment options for diabetic retinopathy  were discussed with patient.    - The need for close monitoring of blood glucose, blood pressure, and serum lipids, avoiding cigarette or any type of tobacco, and the need for long term follow up was also discussed with patient.  - f/u in 1 year, sooner prn  8. Pseudophakia OU  - s/p CE/IOL OU (Dr. [name], October 2020)  -  subluxed IOL OD as above - s/p IOL explantation on 11.18.2020, sutured IOL on 3.4.21  - OS doing well  - monitor   Ophthalmic Meds Ordered this visit:  No orders of the defined types were placed in this encounter.      Return in about 4 weeks (around 11/07/2019) for f/u dislocated IOL OD, DFE, OCT.  There are no Patient Instructions on file for this visit.   Explained the diagnoses, plan, and follow up with the patient and they expressed understanding.  Patient expressed understanding of the importance of proper follow up care.   This document serves as a record of services personally performed by Gardiner Sleeper, MD, PhD. It was created on their behalf by Leeann Must, [place], a certified ophthalmic assistant. The creation of this record is the provider's dictation and/or activities during the visit.    Electronically signed by: Leeann Must, COA _0 @ 11:49 PM   This document serves as a record of services personally performed by Gardiner Sleeper, MD, PhD. It was created on their behalf by Ernest Mallick, OA, an ophthalmic assistant. The creation of this record is the provider's dictation and/or activities during the visit.    Electronically signed by: Ernest Mallick, OA 06.17.2021 11:49 PM   Gardiner Sleeper, M.D., Ph.D. Diseases & Surgery of the Retina and Vitreous [facility]  I have reviewed the above documentation for accuracy and completeness, and I agree with the above. Gardiner Sleeper, M.D., Ph.D. 10/10/19 11:49 PM   Abbreviations: M myopia (nearsighted); A astigmatism; H hyperopia (farsighted); P presbyopia; Mrx spectacle prescription;  CTL contact lenses; OD right eye; OS left eye; OU both eyes  XT exotropia; ET esotropia; PEK punctate epithelial keratitis; PEE punctate epithelial erosions; DES dry eye syndrome; MGD meibomian gland dysfunction; ATs artificial tears; PFAT's preservative free artificial tears; [place] nuclear sclerotic cataract; PSC posterior subcapsular cataract; ERM epi-retinal membrane; PVD posterior vitreous detachment; RD retinal detachment; DM diabetes mellitus; DR diabetic retinopathy; NPDR non-proliferative diabetic retinopathy; PDR proliferative diabetic retinopathy; CSME clinically significant macular edema; DME diabetic macular edema; dbh dot blot hemorrhages; CWS cotton wool spot; POAG primary open angle glaucoma; C/D cup-to-disc ratio; HVF humphrey visual field; GVF goldmann visual field; OCT optical coherence tomography; IOP intraocular pressure; BRVO Branch retinal vein occlusion; CRVO central retinal vein occlusion; CRAO central retinal artery occlusion; BRAO branch retinal artery occlusion; RT retinal tear; SB scleral buckle; PPV pars plana vitrectomy; VH Vitreous hemorrhage; PRP panretinal laser photocoagulation; IVK intravitreal kenalog; VMT vitreomacular traction; MH Macular hole;  NVD neovascularization of the disc; NVE neovascularization elsewhere; AREDS age related eye disease study; ARMD age related macular degeneration; POAG primary open angle glaucoma; EBMD epithelial/anterior basement membrane dystrophy; ACIOL anterior chamber intraocular lens; IOL intraocular lens; PCIOL posterior chamber intraocular lens; Phaco/IOL phacoemulsification with intraocular lens placement; [place] photorefractive keratectomy; LASIK laser  assisted in situ keratomileusis; HTN hypertension; DM diabetes mellitus; COPD chronic obstructive pulmonary disease

## 2019-10-10 ENCOUNTER — Other Ambulatory Visit: Payer: Self-pay

## 2019-10-10 ENCOUNTER — Encounter (INDEPENDENT_AMBULATORY_CARE_PROVIDER_SITE_OTHER): Payer: Self-pay | Admitting: Ophthalmology

## 2019-10-10 ENCOUNTER — Ambulatory Visit (INDEPENDENT_AMBULATORY_CARE_PROVIDER_SITE_OTHER): Payer: Medicare Other | Admitting: Ophthalmology

## 2019-10-10 DIAGNOSIS — I10 Essential (primary) hypertension: Secondary | ICD-10-CM | POA: Diagnosis not present

## 2019-10-10 DIAGNOSIS — H35033 Hypertensive retinopathy, bilateral: Secondary | ICD-10-CM | POA: Diagnosis not present

## 2019-10-10 DIAGNOSIS — T8522XS Displacement of intraocular lens, sequela: Secondary | ICD-10-CM

## 2019-10-10 DIAGNOSIS — H3581 Retinal edema: Secondary | ICD-10-CM | POA: Diagnosis not present

## 2019-10-10 DIAGNOSIS — Z961 Presence of intraocular lens: Secondary | ICD-10-CM

## 2019-10-10 DIAGNOSIS — H3321 Serous retinal detachment, right eye: Secondary | ICD-10-CM

## 2019-10-10 DIAGNOSIS — H3589 Other specified retinal disorders: Secondary | ICD-10-CM | POA: Diagnosis not present

## 2019-10-10 DIAGNOSIS — E119 Type 2 diabetes mellitus without complications: Secondary | ICD-10-CM

## 2019-10-18 ENCOUNTER — Other Ambulatory Visit: Payer: Self-pay | Admitting: *Deleted

## 2019-10-18 DIAGNOSIS — Z8673 Personal history of transient ischemic attack (TIA), and cerebral infarction without residual deficits: Secondary | ICD-10-CM

## 2019-10-18 MED ORDER — CLOPIDOGREL BISULFATE 75 MG PO TABS: 75.0000 mg | ORAL_TABLET | Freq: Every day | ORAL | 0 refills | Status: DC

## 2019-10-18 NOTE — Telephone Encounter (Signed)
Please ask patient to schedule a follow up visit with me.  Thanks! Julie-Ann Vanmaanen J Deserie Dirks, MD  

## 2019-11-04 NOTE — Progress Notes (Signed)
[facility] Note  11/08/2019     CHIEF COMPLAINT Patient presents for Retina Follow Up   HISTORY OF PRESENT ILLNESS: Jonathan [name] is a 69 y.o. male who presents to the clinic today for:   HPI    Retina Follow Up    Patient presents with  Other.  In right eye.  Severity is mild.  Duration of 4 weeks.  Since onset it is gradually improving.  I, the attending physician,  performed the HPI with the patient and updated documentation appropriately.          Comments    Pt states vision is improving, no eye pain, no new floaters or flashes of light, he is still using gati qid OD       Last edited by [name], MD on 11/08/2019  1:47 PM. (History)    pt states he had no problems after the last visit, he is not using Cosopt right now  Referring physician: [name], MD [place], [place] [address]   HISTORICAL INFORMATION:   Selected notes from the MEDICAL RECORD NUMBER Referred by Dr. [name] for evaluation of dislocated IOL   CURRENT MEDICATIONS: Current Outpatient Medications (Ophthalmic Drugs)  Medication Sig  . bacitracin-polymyxin b (POLYSPORIN) ophthalmic ointment Place into the right eye at bedtime. Place a 1/2 inch ribbon of ointment into the lower eyelid.  Marland Kitchen brimonidine (ALPHAGAN) 0.2 % ophthalmic solution Place 1 drop into the right eye 2 (two) times daily.  . dorzolamide-timolol (COSOPT) 22.3-6.8 MG/ML ophthalmic solution Place 1 drop into the right eye 2 (two) times daily.  . prednisoLONE acetate (PRED FORTE) 1 % ophthalmic suspension Place 1 drop into the right eye every 2 (two) hours.   No current facility-administered medications for this visit. (Ophthalmic Drugs)   Current Outpatient Medications (Other)  Medication Sig  . acetaminophen (TYLENOL) 500 MG tablet Take 1,000 mg by mouth every 6 (six) hours as needed for moderate pain or headache.  Marland Kitchen amLODipine (NORVASC) 5 MG tablet Take 1 tablet (5 mg total) by mouth  daily.  Marland Kitchen atorvastatin (LIPITOR) 40 MG tablet Take 1 tablet (40 mg total) by mouth daily. (Patient not taking: Reported on 06/07/2019)  . Blood Glucose Monitoring Suppl (ONETOUCH VERIO) w/Device KIT Check blood sugar once per day  . buPROPion (WELLBUTRIN SR) 150 MG 12 hr tablet Take 1 tablet (150 mg total) by mouth 2 (two) times daily. (Patient not taking: Reported on 06/07/2019)  . clopidogrel (PLAVIX) 75 MG tablet Take 1 tablet (75 mg total) by mouth daily.  . fexofenadine (ALLEGRA ALLERGY) 180 MG tablet Take 1 tablet (180 mg total) by mouth daily. (Patient not taking: Reported on 06/07/2019)  . fluticasone (FLONASE) 50 MCG/ACT nasal spray Place 1 spray into both nostrils daily. (Patient not taking: Reported on 06/07/2019)  . glucose blood (ONETOUCH VERIO) test strip Check sugar once per day  . Lancet Devices (ONE TOUCH DELICA LANCING DEV) MISC Check sugar once per day  . lisinopril (ZESTRIL) 20 MG tablet Take 1 tablet (20 mg total) by mouth daily.  . metFORMIN (GLUCOPHAGE) 500 MG tablet Take 1 tablet (500 mg total) by mouth 2 (two) times daily with a meal.  . OneTouch Delica Lancets 37D MISC Check sugar once per day   No current facility-administered medications for this visit. (Other)      REVIEW OF SYSTEMS: ROS    Positive for: Endocrine, Cardiovascular, Eyes, Psychiatric   Negative for: Constitutional, Gastrointestinal,  Neurological, Skin, Genitourinary, Musculoskeletal, HENT, Respiratory, Allergic/Imm, Heme/Lymph   Last edited by Debbrah Alar, COT on 11/08/2019  1:23 PM. (History)       ALLERGIES No Known Allergies  PAST MEDICAL HISTORY Past Medical History:  Diagnosis Date  . Anxiety   . Arthritis   . COPD (chronic obstructive pulmonary disease) ([place])   . Depression   . Diabetes mellitus without complication ([place])   . Diarrhea   . History of kidney stones   . HTN (hypertension)   . Hyperlipidemia   . Hypertensive retinopathy    OU  . Pneumonia   . Retinal detachment     Rheg. RD OD  . Sleep apnea    does not use cpap  . Stroke [facility])    denies any deficits   Past Surgical History:  Procedure Laterality Date  . CATARACT EXTRACTION Bilateral    Dr. [name]  . CIRCUMCISION    . EYE SURGERY Bilateral    Cat Sx OU; RD repair OD  . GAS INSERTION Right 03/13/2019   Procedure: INSERTION OF GAS (C3F8) RIGHT EYE;  Surgeon: [name], MD;  Location: [place];  Service: Ophthalmology;  Laterality: Right;  . LASER PHOTO ABLATION Right 03/13/2019   Procedure: LASER PHOTO  ABLATION RIGHT EYE;  Surgeon: [name], MD;  Location: [place];  Service: Ophthalmology;  Laterality: Right;  . none    . PARS PLANA VITRECTOMY Right 03/13/2019   Procedure: 25 GAUGE PARS PLANA VITRECTOMY WITH  INTRAOCULAR LENSE EXPLANTATION RIGHT EYE.;  Surgeon: [name], MD;  Location: [place];  Service: Ophthalmology;  Laterality: Right;  . PARS PLANA VITRECTOMY Right 06/27/2019   Procedure: PARS PLANA VITRECTOMY WITH 25 GAUGE;  Surgeon: [name], MD;  Location: [place];  Service: Ophthalmology;  Laterality: Right;  . PHOTOCOAGULATION WITH LASER Left 03/13/2019   Procedure: INDIRECT PHOTOCOAGULATION WITH LASER LEFT EYE;  Surgeon: [name], MD;  Location: [place];  Service: Ophthalmology;  Laterality: Left;  . PLACEMENT AND SUTURE OF SECONDARY INTRAOCULAR LENS Right 06/27/2019   Procedure: PLACEMENT AND SUTURE OF SECONDARY INTRAOCULAR LENS;  Surgeon: [name], MD;  Location: [place];  Service: Ophthalmology;  Laterality: Right;  . RETINAL DETACHMENT SURGERY Right 03/13/2019   PPV for repair of rheg. RD - Dr. [name]    FAMILY HISTORY Family History  Problem Relation Age of Onset  . Cancer Mother        brain tumor  . Heart disease Father   . Cirrhosis Father     SOCIAL HISTORY Social History   Tobacco Use  . Smoking status: Current Every Day Smoker    Packs/day: 1.00    Years: 43.00    Pack years: 43.00    Types: Cigarettes  . Smokeless tobacco: Never Used   . Tobacco comment: pt is trying to aggressively cut back on # cigs smoked daily.   Vaping Use  . Vaping Use: Never used  Substance Use Topics  . Alcohol use: No  . Drug use: No         OPHTHALMIC EXAM:  Base Eye Exam    Visual Acuity (Snellen - Linear)      Right Left   Dist [place] 20/100 +2 20/20 -1   Dist ph [place] 20/40 -1 NI       Tonometry (Tonopen, 1:30 PM)      Right Left   Pressure 26 14       Tonometry #2 (Tonopen, 1:30 PM)  Right Left   Pressure 24        Pupils      Dark Light Shape React APD   Right 5 5 Round NR None   Left 4 3 Round Brisk None       Visual Fields (Counting fingers)      Left Right    Full Full       Extraocular Movement      Right Left    Full, Ortho Full, Ortho       Neuro/Psych    Oriented x3: Yes   Mood/Affect: Normal       Dilation    Both eyes: 1.0% Mydriacyl, 2.5% Phenylephrine @ 1:30 PM        Slit Lamp and Fundus Exam    External Exam      Right Left   External Normal Normal       Slit Lamp Exam      Right Left   Lids/Lashes Dermatochalasis - upper lid, multiple papillomas, Telangiectasia, mild Meibomian gland dysfunction Dermatochalasis - upper lid, multiple papillomas, Telangiectasia, mild Meibomian gland dysfunction   Conjunctiva/Sclera Subconjunctival hemorrhage--improved, sutures intact Pinguecula, trace temporal Injection   Cornea Arcus, well healed superior cataract wound, nylon suture at 0300 (mild Descemet's folds), no epi defect, 1+ Punctate epithelial erosions, trace Debris in tear film Arcus, Debris in tear film, 2+Punctate epithelial erosions   Anterior Chamber Deep, 1/2+cell/pigment Deep and quiet   Iris Round and dilated Round and dilated   Lens Sutured Akreos IOL in excellent position Posterior chamber intraocular lens in good position   Vitreous post vitrectomy; trace pigment Vitreous syneresis       Fundus Exam      Right Left   Disc Pink and Sharp Pink and Sharp, mild temporal PPP   C/D  Ratio 0.4 0.4   Macula flat, Blunted foveal reflex, Retinal pigment epithelial mottling, mild Epiretinal membrane Flat, Blunted foveal reflex, No heme or edema   Vessels Vascular attenuation, Tortuous Vascular attenuation, very Tortuous, AV crossing changes   Periphery attached; good laser changes 360, ORIGINALLY: Bullous superior detachment from 1000-130 with multiple tears at 1030 and 1100 Attached, paving stone degeneration inferiorly, retinal hole at 1200 and tears at 430 and 0730; good laser surrounding breaks at 1200, 0430 and 0730        Refraction    Manifest Refraction      Sphere Cylinder Axis Dist VA   Right -4.00 +4.25 019 20/50   Left +0.25 +0.25 159 20/20   K's OD: 46.50 @ 026/43.25 _0  = +3.25          IMAGING AND PROCEDURES  Imaging and Procedures for _1 @  OCT, Retina - OU - Both Eyes       Right Eye Quality was good. Central Foveal Thickness: 296. Progression has been stable. Findings include no IRF, no SRF, normal foveal contour, macular pucker (Mild Nasal macular pucker - improved from prior).   Left Eye Quality was good. Central Foveal Thickness: 315. Progression has been stable. Findings include normal foveal contour, no IRF, no SRF, epiretinal membrane, macular pucker.   Notes *Images captured and stored on drive  Diagnosis / Impression:  OD: retina stably reattached -- NFP, no IRF/SRF, mild nasal macular pucker -- improved from prior OS: NFP, no IRF/SRF; +ERM w/ pucker--stable  Clinical management:  See below  Abbreviations: NFP - Normal foveal profile. CME - cystoid macular edema. PED - pigment epithelial detachment. IRF - intraretinal fluid. SRF - subretinal  fluid. EZ - ellipsoid zone. ERM - epiretinal membrane. ORA - outer retinal atrophy. ORT - outer retinal tubulation. SRHM - subretinal hyper-reflective material                 ASSESSMENT/PLAN:    ICD-10-CM   1. Right retinal detachment  H33.21   2. Retinal edema  H35.81 OCT,  Retina - OU - Both Eyes  3. Dislocation of intraocular lens, sequela  T85.22XS   4. Retinal breaks without detachment  H35.89   5. Essential hypertension  I10   6. Hypertensive retinopathy of both eyes  H35.033   7. Diabetes mellitus type 2 without retinopathy ([place])  E11.9   8. Pseudophakia of both eyes  Z96.1     1,2. Rhegmatogenous retinal detachment, right eye  - bullous superior mac off detachment, onset ~1 wk, with onset of foveal involvement Tuesday, 03/12/19 by pt history  - detached from 1000 to 0130, fovea off, multiple tears at 1030 and 1100  - s/p PPV/IOL explanation/EL/FAX/14% C3F8 OD, 11.18.20             - retina attached and in good position  3. Dislocated IOL OD  - s/p CE/IOL OD on 10.21.20 w/ Dr. [name]  - IOL displaced inferiorly with superior haptic hung up in capsular remnant   - poor dilation -- ?pseudoexfoliation, floppy iris OD  - developed macula/fovea involving RD as above  - s/p IOL explantation during RD repair 11.18.20 as above  - s/p secondary sutured Akreos IOL 03.04.21             - doing well with vision improved to 20/40 from 20/60 today  - 1200 sutures removed at slit lamp,  04.23.21  - 0900 suture removed at slit lamp, 6.17.21  - 0300 suture removed at slit lamp today, 07.16.21             - retina attached and in good position             - IOP 24 today  - cont Cosopt BID OD  - start Zymaxid qid x7 days post suture removal --   - f/u 4 weeks, DFE, OCT, MRx/K's  4. Retinal breaks without detachment OS  - scleral depressed exam of OS completed as part of RD work up  - large retinal break at 12, smaller flap tears at 0430 amd 0730  - s/p laser retinopexy OS (11.18.20) -- good laser around breaks at 1200, 0430 and 0730  5,6. Hypertensive retinopathy OU  - discussed importance of tight BP control  - monitor  7. Diabetes mellitus, type 2 without retinopathy  - The incidence, risk factors for progression, natural history and treatment options  for diabetic retinopathy  were discussed with patient.    - The need for close monitoring of blood glucose, blood pressure, and serum lipids, avoiding cigarette or any type of tobacco, and the need for long term follow up was also discussed with patient.  - f/u in 1 year, sooner prn  8. Pseudophakia OU  - s/p CE/IOL OU (Dr. [name], October 2020)  - subluxed IOL OD as above - s/p IOL explantation on 11.18.2020, sutured IOL on 3.4.21  - OS doing well  - monitor   Ophthalmic Meds Ordered this visit:  No orders of the defined types were placed in this encounter.      Return in about 4 weeks (around 12/06/2019) for f/u dislocated IOL OD, DFE, OCT.  There are no Patient Instructions on  file for this visit.   Explained the diagnoses, plan, and follow up with the patient and they expressed understanding.  Patient expressed understanding of the importance of proper follow up care.   This document serves as a record of services personally performed by Gardiner Sleeper, MD, PhD. It was created on their behalf by San Jetty. Owens Shark, OA an ophthalmic technician. The creation of this record is the provider's dictation and/or activities during the visit.    Electronically signed by: San Jetty. Marguerita Merles 07.12.2021 12:55 AM   Gardiner Sleeper, M.D., Ph.D. Diseases & Surgery of the Retina and Vitreous [facility]  I have reviewed the above documentation for accuracy and completeness, and I agree with the above. Gardiner Sleeper, M.D., Ph.D. 11/10/19 12:55 AM    Abbreviations: M myopia (nearsighted); A astigmatism; H hyperopia (farsighted); P presbyopia; Mrx spectacle prescription;  CTL contact lenses; OD right eye; OS left eye; OU both eyes  XT exotropia; ET esotropia; PEK punctate epithelial keratitis; PEE punctate epithelial erosions; DES dry eye syndrome; MGD meibomian gland dysfunction; ATs artificial tears; PFAT's preservative free artificial tears; [place] nuclear sclerotic  cataract; PSC posterior subcapsular cataract; ERM epi-retinal membrane; PVD posterior vitreous detachment; RD retinal detachment; DM diabetes mellitus; DR diabetic retinopathy; NPDR non-proliferative diabetic retinopathy; PDR proliferative diabetic retinopathy; CSME clinically significant macular edema; DME diabetic macular edema; dbh dot blot hemorrhages; CWS cotton wool spot; POAG primary open angle glaucoma; C/D cup-to-disc ratio; HVF humphrey visual field; GVF goldmann visual field; OCT optical coherence tomography; IOP intraocular pressure; BRVO Branch retinal vein occlusion; CRVO central retinal vein occlusion; CRAO central retinal artery occlusion; BRAO branch retinal artery occlusion; RT retinal tear; SB scleral buckle; PPV pars plana vitrectomy; VH Vitreous hemorrhage; PRP panretinal laser photocoagulation; IVK intravitreal kenalog; VMT vitreomacular traction; MH Macular hole;  NVD neovascularization of the disc; NVE neovascularization elsewhere; AREDS age related eye disease study; ARMD age related macular degeneration; POAG primary open angle glaucoma; EBMD epithelial/anterior basement membrane dystrophy; ACIOL anterior chamber intraocular lens; IOL intraocular lens; PCIOL posterior chamber intraocular lens; Phaco/IOL phacoemulsification with intraocular lens placement; [place] photorefractive keratectomy; LASIK laser assisted in situ keratomileusis; HTN hypertension; DM diabetes mellitus; COPD chronic obstructive pulmonary disease

## 2019-11-07 ENCOUNTER — Other Ambulatory Visit: Payer: Self-pay

## 2019-11-07 DIAGNOSIS — Z8673 Personal history of transient ischemic attack (TIA), and cerebral infarction without residual deficits: Secondary | ICD-10-CM

## 2019-11-08 ENCOUNTER — Other Ambulatory Visit: Payer: Self-pay

## 2019-11-08 ENCOUNTER — Encounter (INDEPENDENT_AMBULATORY_CARE_PROVIDER_SITE_OTHER): Payer: Self-pay | Admitting: Ophthalmology

## 2019-11-08 ENCOUNTER — Ambulatory Visit (INDEPENDENT_AMBULATORY_CARE_PROVIDER_SITE_OTHER): Payer: Medicare Other | Admitting: Ophthalmology

## 2019-11-08 DIAGNOSIS — H3589 Other specified retinal disorders: Secondary | ICD-10-CM

## 2019-11-08 DIAGNOSIS — H3581 Retinal edema: Secondary | ICD-10-CM | POA: Diagnosis not present

## 2019-11-08 DIAGNOSIS — I10 Essential (primary) hypertension: Secondary | ICD-10-CM

## 2019-11-08 DIAGNOSIS — Z961 Presence of intraocular lens: Secondary | ICD-10-CM

## 2019-11-08 DIAGNOSIS — H35033 Hypertensive retinopathy, bilateral: Secondary | ICD-10-CM

## 2019-11-08 DIAGNOSIS — T8522XS Displacement of intraocular lens, sequela: Secondary | ICD-10-CM

## 2019-11-08 DIAGNOSIS — H3321 Serous retinal detachment, right eye: Secondary | ICD-10-CM | POA: Diagnosis not present

## 2019-11-08 DIAGNOSIS — E119 Type 2 diabetes mellitus without complications: Secondary | ICD-10-CM

## 2019-11-08 LAB — HM DIABETES EYE EXAM

## 2019-11-11 MED ORDER — CLOPIDOGREL BISULFATE 75 MG PO TABS: 75.0000 mg | ORAL_TABLET | Freq: Every day | ORAL | 0 refills | Status: DC

## 2019-11-14 ENCOUNTER — Telehealth: Payer: Self-pay

## 2019-11-14 NOTE — Telephone Encounter (Signed)
-----  Message from [place] Pink, [place] sent at 11/08/2019  9:56 AM EDT ----- Regarding: cologuard Cologuard due, please contact patient to collect and return kit.

## 2019-11-14 NOTE — Telephone Encounter (Signed)
LVM for patient to call [facility] concerning Cologuard kit and its return for screening.  Jonathan [name], [place]

## 2019-11-14 NOTE — Telephone Encounter (Signed)
Spoke with patient's wife, they have misplaced the cologuard kit. I gave them the 800 number to call and ask for a replacement. Jaymes Graff Lachlyn Vanderstelt

## 2019-11-21 ENCOUNTER — Ambulatory Visit (INDEPENDENT_AMBULATORY_CARE_PROVIDER_SITE_OTHER): Payer: Medicare Other | Admitting: Family Medicine

## 2019-11-21 DIAGNOSIS — Z5329 Procedure and treatment not carried out because of patient's decision for other reasons: Secondary | ICD-10-CM

## 2019-11-25 NOTE — Progress Notes (Signed)
   Complete physical exam  Patient: Jonathan [name]   DOB: 02/12/1998   69 y.o. Male  MRN: [id number]  Subjective:    No chief complaint on file.   Jonathan [name] is a 69 y.o. male who presents today for a complete physical exam. She reports consuming a {diet types:17450} diet. {types:19826} She generally feels {DESC; WELL/FAIRLY WELL/POORLY:18703}. She reports sleeping {DESC; WELL/FAIRLY WELL/POORLY:18703}. She {does/does not:200015} have additional problems to discuss today.    Most recent fall risk assessment:    10/20/2021   10:42 AM  Fall Risk   Falls in the past year? 0  Number falls in past yr: 0  Injury with Fall? 0  Risk for fall due to : No Fall Risks  Follow up Falls evaluation completed     Most recent depression screenings:    10/20/2021   10:42 AM 09/10/2020   10:46 AM  PHQ 2/9 Scores  PHQ - 2 Score 0 0  PHQ- 9 Score 5     {VISON DENTAL STD PSA (Optional):27386}  {History (Optional):23778}  Patient Care Team: Jessup, Joy, NP as PCP - General (Nurse Practitioner)   Outpatient Medications Prior to Visit  Medication Sig   fluticasone (FLONASE) 50 MCG/ACT nasal spray Place 2 sprays into both nostrils in the morning and at bedtime. After 7 days, reduce to once daily.   norgestimate-ethinyl estradiol (SPRINTEC 28) 0.25-35 MG-MCG tablet Take 1 tablet by mouth daily.   Nystatin POWD Apply liberally to affected area 2 times per day   spironolactone (ALDACTONE) 100 MG tablet Take 1 tablet (100 mg total) by mouth daily.   No facility-administered medications prior to visit.    ROS        Objective:     There were no vitals taken for this visit. {Vitals History (Optional):23777}  Physical Exam   No results found for any visits on 11/25/21. {Show previous labs (optional):23779}    Assessment & Plan:    Routine Health Maintenance and Physical Exam  Immunization History  Administered Date(s) Administered   DTaP 04/28/1999, 06/24/1999,  09/02/1999, 05/18/2000, 12/02/2003   Hepatitis A 09/28/2007, 10/03/2008   Hepatitis B 02/13/1999, 03/23/1999, 09/02/1999   HiB (PRP-OMP) 04/28/1999, 06/24/1999, 09/02/1999, 05/18/2000   IPV 04/28/1999, 06/24/1999, 02/21/2000, 12/02/2003   Influenza,inj,Quad PF,6+ Mos 01/03/2014   Influenza-Unspecified 04/04/2012   MMR 02/20/2001, 12/02/2003   Meningococcal Polysaccharide 10/03/2011   Pneumococcal Conjugate-13 05/18/2000   Pneumococcal-Unspecified 09/02/1999, 11/16/1999   Tdap 10/03/2011   Varicella 02/21/2000, 09/28/2007    Health Maintenance  Topic Date Due   HIV Screening  Never done   Hepatitis C Screening  Never done   INFLUENZA VACCINE  11/23/2021   PAP-Cervical Cytology Screening  11/25/2021 (Originally 02/12/2020)   PAP SMEAR-Modifier  11/25/2021 (Originally 02/12/2020)   TETANUS/TDAP  11/25/2021 (Originally 10/02/2021)   HPV VACCINES  Discontinued   COVID-19 Vaccine  Discontinued    Discussed health benefits of physical activity, and encouraged her to engage in regular exercise appropriate for her age and condition.  Problem List Items Addressed This Visit   None Visit Diagnoses     Annual physical exam    -  Primary   Cervical cancer screening       Need for Tdap vaccination          No follow-ups on file.     Joy Jessup, NP   

## 2019-12-03 NOTE — Progress Notes (Signed)
[facility] Note  12/06/2019     CHIEF COMPLAINT Patient presents for Retina Follow Up   HISTORY OF PRESENT ILLNESS: Jonathan [name] is a 69 y.o. male who presents to the clinic today for:   HPI    Retina Follow Up    Patient presents with  Other.  In right eye.  This started months ago.  Severity is moderate.  Duration of 4 weeks.  Since onset it is gradually improving.  I, the attending physician,  performed the HPI with the patient and updated documentation appropriately.          Comments    69 y/o male pt here for 4 wk f/u for dislocated IOL OD.  S/p secondary sutured Akreos IOL OD 3.4.21.  VA OD continues to gradually improve.  No change in [place] OS.  Denies pain, FOL, floaters.  Cosopt BID OD.  Zymaxid BID OD.  BS and A1C unknown.       Last edited by [name], MD on 12/06/2019  3:00 PM. (History)    pt states his eye is doing well, his vision is getting better  Referring physician: [name], MD [place], [place] [address]   HISTORICAL INFORMATION:   Selected notes from the MEDICAL RECORD NUMBER Referred by Dr. [name] for evaluation of dislocated IOL   CURRENT MEDICATIONS: Current Outpatient Medications (Ophthalmic Drugs)  Medication Sig  . dorzolamide-timolol (COSOPT) 22.3-6.8 MG/ML ophthalmic solution Place 1 drop into the right eye 2 (two) times daily.  . bacitracin-polymyxin b (POLYSPORIN) ophthalmic ointment Place into the right eye at bedtime. Place a 1/2 inch ribbon of ointment into the lower eyelid. (Patient not taking: Reported on 12/06/2019)  . brimonidine (ALPHAGAN) 0.2 % ophthalmic solution Place 1 drop into the right eye 2 (two) times daily. (Patient not taking: Reported on 12/06/2019)  . prednisoLONE acetate (PRED FORTE) 1 % ophthalmic suspension Place 1 drop into the right eye every 2 (two) hours. (Patient not taking: Reported on 12/06/2019)   No current facility-administered medications for this visit.  (Ophthalmic Drugs)   Current Outpatient Medications (Other)  Medication Sig  . acetaminophen (TYLENOL) 500 MG tablet Take 1,000 mg by mouth every 6 (six) hours as needed for moderate pain or headache.  Marland Kitchen amLODipine (NORVASC) 5 MG tablet Take 1 tablet (5 mg total) by mouth daily.  Marland Kitchen atorvastatin (LIPITOR) 40 MG tablet Take 1 tablet (40 mg total) by mouth daily.  . Blood Glucose Monitoring Suppl (ONETOUCH VERIO) w/Device KIT Check blood sugar once per day  . buPROPion (WELLBUTRIN SR) 150 MG 12 hr tablet Take 1 tablet (150 mg total) by mouth 2 (two) times daily.  . clopidogrel (PLAVIX) 75 MG tablet Take 1 tablet (75 mg total) by mouth daily.  . fexofenadine (ALLEGRA ALLERGY) 180 MG tablet Take 1 tablet (180 mg total) by mouth daily.  . fluticasone (FLONASE) 50 MCG/ACT nasal spray Place 1 spray into both nostrils daily.  Marland Kitchen glucose blood (ONETOUCH VERIO) test strip Check sugar once per day  . Lancet Devices (ONE TOUCH DELICA LANCING DEV) MISC Check sugar once per day  . lisinopril (ZESTRIL) 20 MG tablet Take 1 tablet (20 mg total) by mouth daily.  . metFORMIN (GLUCOPHAGE) 500 MG tablet Take 1 tablet (500 mg total) by mouth 2 (two) times daily with a meal.  . OneTouch Delica Lancets 14H MISC Check sugar once per day   No current facility-administered medications for this visit. (Other)  REVIEW OF SYSTEMS: ROS    Positive for: HENT, Endocrine, Cardiovascular, Eyes, Respiratory   Negative for: Constitutional, Gastrointestinal, Neurological, Skin, Genitourinary, Musculoskeletal, Psychiatric, Allergic/Imm, Heme/Lymph   Last edited by Matthew Folks, COA on 12/06/2019  2:10 PM. (History)       ALLERGIES No Known Allergies  PAST MEDICAL HISTORY Past Medical History:  Diagnosis Date  . Anxiety   . Arthritis   . COPD (chronic obstructive pulmonary disease) ([place])   . Depression   . Diabetes mellitus without complication ([place])   . Diarrhea   . History of kidney stones   . HTN  (hypertension)   . Hyperlipidemia   . Hypertensive retinopathy    OU  . Pneumonia   . Retinal detachment    Rheg. RD OD  . Sleep apnea    does not use cpap  . Stroke [facility])    denies any deficits   Past Surgical History:  Procedure Laterality Date  . CATARACT EXTRACTION Bilateral    Dr. [name]  . CIRCUMCISION    . EYE SURGERY Bilateral    Cat Sx OU; RD repair OD  . GAS INSERTION Right 03/13/2019   Procedure: INSERTION OF GAS (C3F8) RIGHT EYE;  Surgeon: [name], MD;  Location: [facility];  Service: Ophthalmology;  Laterality: Right;  . LASER PHOTO ABLATION Right 03/13/2019   Procedure: LASER PHOTO  ABLATION RIGHT EYE;  Surgeon: [name], MD;  Location: [place];  Service: Ophthalmology;  Laterality: Right;  . none    . PARS PLANA VITRECTOMY Right 03/13/2019   Procedure: 25 GAUGE PARS PLANA VITRECTOMY WITH  INTRAOCULAR LENSE EXPLANTATION RIGHT EYE.;  Surgeon: [name], MD;  Location: [facility];  Service: Ophthalmology;  Laterality: Right;  . PARS PLANA VITRECTOMY Right 06/27/2019   Procedure: PARS PLANA VITRECTOMY WITH 25 GAUGE;  Surgeon: [name], MD;  Location: [facility];  Service: Ophthalmology;  Laterality: Right;  . PHOTOCOAGULATION WITH LASER Left 03/13/2019   Procedure: INDIRECT PHOTOCOAGULATION WITH LASER LEFT EYE;  Surgeon: [name], MD;  Location: [facility];  Service: Ophthalmology;  Laterality: Left;  . PLACEMENT AND SUTURE OF SECONDARY INTRAOCULAR LENS Right 06/27/2019   Procedure: PLACEMENT AND SUTURE OF SECONDARY INTRAOCULAR LENS;  Surgeon: [name], MD;  Location: [facility];  Service: Ophthalmology;  Laterality: Right;  . RETINAL DETACHMENT SURGERY Right 03/13/2019   PPV for repair of rheg. RD - Dr. [name]    FAMILY HISTORY Family History  Problem Relation Age of Onset  . Cancer Mother        brain tumor  . Heart disease Father   . Cirrhosis Father     SOCIAL HISTORY Social History   Tobacco Use  . Smoking status: Current Every Day Smoker     Packs/day: 1.00    Years: 43.00    Pack years: 43.00    Types: Cigarettes  . Smokeless tobacco: Never Used  . Tobacco comment: pt is trying to aggressively cut back on # cigs smoked daily.   Vaping Use  . Vaping Use: Never used  Substance Use Topics  . Alcohol use: No  . Drug use: No         OPHTHALMIC EXAM:  Base Eye Exam    Visual Acuity (Snellen - Linear)      Right Left   Dist [place] 20/80 -2 20/25   Dist ph [place] 20/40 -2 NI       Tonometry (Tonopen, 2:13 PM)      Right Left  Pressure 11 11       Pupils      Dark Light Shape React APD   Right 5 5 Round NR None   Left 4 3 Round Brisk None       Visual Fields (Counting fingers)      Left Right    Full Full       Extraocular Movement      Right Left    Full, Ortho Full, Ortho       Neuro/Psych    Oriented x3: Yes   Mood/Affect: Normal       Dilation    Both eyes: 1.0% Mydriacyl, 2.5% Phenylephrine @ 2:13 PM        Slit Lamp and Fundus Exam    External Exam      Right Left   External Normal Normal       Slit Lamp Exam      Right Left   Lids/Lashes Dermatochalasis - upper lid, multiple papillomas, Telangiectasia, mild Meibomian gland dysfunction Dermatochalasis - upper lid, multiple papillomas, Telangiectasia, mild Meibomian gland dysfunction   Conjunctiva/Sclera White and quiet Pinguecula, trace temporal Injection   Cornea Arcus, well healed superior cataract wound, sub epi scar, temporal paracentral Arcus, Debris in tear film, 2+Punctate epithelial erosions   Anterior Chamber Deep, 1/2+cell/pigment Deep and quiet   Iris Round and dilated Round and dilated   Lens Sutured Akreos IOL in excellent position Posterior chamber intraocular lens in good position   Vitreous post vitrectomy; trace pigment Vitreous syneresis       Fundus Exam      Right Left   Disc Pink and Sharp Pink and Sharp, mild temporal PPP   C/D Ratio 0.5 0.3   Macula flat, Blunted foveal reflex, Retinal pigment epithelial mottling,  mild Epiretinal membrane, No heme or edema Flat, Blunted foveal reflex, No heme or edema   Vessels Vascular attenuation, Tortuous Vascular attenuation, very Tortuous, AV crossing changes   Periphery attached; good laser changes 360, ORIGINALLY: Bullous superior detachment from 1000-130 with multiple tears at 1030 and 1100 Attached, paving stone degeneration inferiorly, retinal hole at 1200 and tears at 430 and 0730; good laser surrounding breaks at 1200, 0430 and 0730, No new RT/RD        Refraction    Manifest Refraction      Sphere Cylinder Axis Dist VA   Right -2.75 +3.00 015 20/30-2   Left +0.25 Sphere  20/25  K's OD: 46.00_0 /42.00_1   = +4.00_2         OS: 435.50_3 /43.25_4  = +0.25 @ 75           IMAGING AND PROCEDURES  Imaging and Procedures for _5 @  OCT, Retina - OU - Both Eyes       Right Eye Quality was good. Central Foveal Thickness: 305. Progression has been stable. Findings include no IRF, no SRF, normal foveal contour, macular pucker (Mild Nasal macular pucker).   Left Eye Quality was good. Central Foveal Thickness: 313. Progression has been stable. Findings include normal foveal contour, no IRF, no SRF, epiretinal membrane, macular pucker (stable).   Notes *Images captured and stored on drive  Diagnosis / Impression:  OD: retina stably reattached -- NFP, no IRF/SRF, mild nasal macular pucker  OS: NFP, no IRF/SRF; +ERM w/ pucker--stable  Clinical management:  See below  Abbreviations: NFP - Normal foveal profile. CME - cystoid macular edema. PED - pigment epithelial detachment. IRF - intraretinal fluid. SRF - subretinal fluid. EZ - ellipsoid zone. ERM -  epiretinal membrane. ORA - outer retinal atrophy. ORT - outer retinal tubulation. SRHM - subretinal hyper-reflective material                 ASSESSMENT/PLAN:    ICD-10-CM   1. Right retinal detachment  H33.21   2. Retinal edema  H35.81 OCT, Retina - OU - Both Eyes  3. Dislocation of  intraocular lens, sequela  T85.22XS   4. Retinal breaks without detachment  H35.89   5. Essential hypertension  I10   6. Hypertensive retinopathy of both eyes  H35.033   7. Diabetes mellitus type 2 without retinopathy ([place])  E11.9   8. Pseudophakia of both eyes  Z96.1     1,2. Rhegmatogenous retinal detachment, right eye  - bullous superior mac off detachment, onset ~1 wk, with onset of foveal involvement Tuesday, 03/12/19 by pt history  - detached from 1000 to 0130, fovea off, multiple tears at 1030 and 1100  - s/p PPV/IOL explanation/EL/FAX/14% C3F8 OD, 11.18.20             - retina attached and in good position  3. Dislocated IOL OD  - s/p CE/IOL OD on 10.21.20 w/ Dr. [name]  - IOL displaced inferiorly with superior haptic hung up in capsular remnant   - poor dilation -- ?pseudoexfoliation, floppy iris OD  - developed macula/fovea involving RD as above  - s/p IOL explantation during RD repair 11.18.20 as above  - s/p secondary sutured Akreos IOL 03.04.21             - doing well with vision improved to 20/30 today by MRx  - 1200 sutures removed at slit lamp,  04.23.21  - 0900 suture removed at slit lamp, 6.17.21  - 0300 suture removed at slit lamp, 07.16.21             - retina attached and in good position             - IOP 11 today  - cont Cosopt BID OD -- decrease to Qdaily  - pt is cleared from a retina standpoint to get new MRx  - f/u 6 months, DFE, OCT  4. Retinal breaks without detachment OS  - scleral depressed exam of OS completed as part of RD work up  - large retinal break at 12, smaller flap tears at 0430 amd 0730  - s/p laser retinopexy OS (11.18.20) -- good laser around breaks at 1200, 0430 and 0730  5,6. Hypertensive retinopathy OU  - discussed importance of tight BP control  - monitor  7. Diabetes mellitus, type 2 without retinopathy  - The incidence, risk factors for progression, natural history and treatment options for diabetic retinopathy  were  discussed with patient.    - The need for close monitoring of blood glucose, blood pressure, and serum lipids, avoiding cigarette or any type of tobacco, and the need for long term follow up was also discussed with patient.  - f/u in 1 year, sooner prn  8. Pseudophakia OU  - s/p CE/IOL OU (Dr. [name], October 2020)  - subluxed IOL OD as above - s/p IOL explantation on 11.18.2020, sutured IOL on 3.4.21  - OS doing well  - monitor   Ophthalmic Meds Ordered this visit:  No orders of the defined types were placed in this encounter.      Return in about 6 months (around 06/07/2020) for f/u disclocated IOL OD, DFE, OCT.  There are no Patient Instructions on file for this visit.  Explained the diagnoses, plan, and follow up with the patient and they expressed understanding.  Patient expressed understanding of the importance of proper follow up care.   This document serves as a record of services personally performed by Gardiner Sleeper, MD, PhD. It was created on their behalf by San Jetty. Owens Shark, OA an ophthalmic technician. The creation of this record is the provider's dictation and/or activities during the visit.    Electronically signed by: San Jetty. Owens Shark, [place] 08.10.2021 10:27 PM   Gardiner Sleeper, M.D., Ph.D. Diseases & Surgery of the Retina and Vitreous [facility]  I have reviewed the above documentation for accuracy and completeness, and I agree with the above. Gardiner Sleeper, M.D., Ph.D. 12/10/19 10:27 PM   Abbreviations: M myopia (nearsighted); A astigmatism; H hyperopia (farsighted); P presbyopia; Mrx spectacle prescription;  CTL contact lenses; OD right eye; OS left eye; OU both eyes  XT exotropia; ET esotropia; PEK punctate epithelial keratitis; PEE punctate epithelial erosions; DES dry eye syndrome; MGD meibomian gland dysfunction; ATs artificial tears; PFAT's preservative free artificial tears; [place] nuclear sclerotic cataract; PSC posterior  subcapsular cataract; ERM epi-retinal membrane; PVD posterior vitreous detachment; RD retinal detachment; DM diabetes mellitus; DR diabetic retinopathy; NPDR non-proliferative diabetic retinopathy; PDR proliferative diabetic retinopathy; CSME clinically significant macular edema; DME diabetic macular edema; dbh dot blot hemorrhages; CWS cotton wool spot; POAG primary open angle glaucoma; C/D cup-to-disc ratio; HVF humphrey visual field; GVF goldmann visual field; OCT optical coherence tomography; IOP intraocular pressure; BRVO Branch retinal vein occlusion; CRVO central retinal vein occlusion; CRAO central retinal artery occlusion; BRAO branch retinal artery occlusion; RT retinal tear; SB scleral buckle; PPV pars plana vitrectomy; VH Vitreous hemorrhage; PRP panretinal laser photocoagulation; IVK intravitreal kenalog; VMT vitreomacular traction; MH Macular hole;  NVD neovascularization of the disc; NVE neovascularization elsewhere; AREDS age related eye disease study; ARMD age related macular degeneration; POAG primary open angle glaucoma; EBMD epithelial/anterior basement membrane dystrophy; ACIOL anterior chamber intraocular lens; IOL intraocular lens; PCIOL posterior chamber intraocular lens; Phaco/IOL phacoemulsification with intraocular lens placement; [place] photorefractive keratectomy; LASIK laser assisted in situ keratomileusis; HTN hypertension; DM diabetes mellitus; COPD chronic obstructive pulmonary disease

## 2019-12-06 ENCOUNTER — Encounter (INDEPENDENT_AMBULATORY_CARE_PROVIDER_SITE_OTHER): Payer: Self-pay | Admitting: Ophthalmology

## 2019-12-06 ENCOUNTER — Ambulatory Visit (INDEPENDENT_AMBULATORY_CARE_PROVIDER_SITE_OTHER): Payer: Medicare Other | Admitting: Ophthalmology

## 2019-12-06 ENCOUNTER — Other Ambulatory Visit: Payer: Self-pay

## 2019-12-06 DIAGNOSIS — H3589 Other specified retinal disorders: Secondary | ICD-10-CM

## 2019-12-06 DIAGNOSIS — I10 Essential (primary) hypertension: Secondary | ICD-10-CM

## 2019-12-06 DIAGNOSIS — H35033 Hypertensive retinopathy, bilateral: Secondary | ICD-10-CM

## 2019-12-06 DIAGNOSIS — H3321 Serous retinal detachment, right eye: Secondary | ICD-10-CM | POA: Diagnosis not present

## 2019-12-06 DIAGNOSIS — T8522XS Displacement of intraocular lens, sequela: Secondary | ICD-10-CM

## 2019-12-06 DIAGNOSIS — Z961 Presence of intraocular lens: Secondary | ICD-10-CM

## 2019-12-06 DIAGNOSIS — H3581 Retinal edema: Secondary | ICD-10-CM | POA: Diagnosis not present

## 2019-12-06 DIAGNOSIS — E119 Type 2 diabetes mellitus without complications: Secondary | ICD-10-CM

## 2019-12-08 DIAGNOSIS — H5213 Myopia, bilateral: Secondary | ICD-10-CM | POA: Diagnosis not present

## 2019-12-26 ENCOUNTER — Ambulatory Visit: Payer: Medicare Other | Admitting: Family Medicine

## 2020-01-09 ENCOUNTER — Encounter: Payer: Self-pay | Admitting: Family Medicine

## 2020-01-09 ENCOUNTER — Other Ambulatory Visit: Payer: Self-pay

## 2020-01-09 ENCOUNTER — Ambulatory Visit (INDEPENDENT_AMBULATORY_CARE_PROVIDER_SITE_OTHER): Payer: Medicare Other | Admitting: Family Medicine

## 2020-01-09 VITALS — BP 150/82 | HR 71 | Wt 208.8 lb

## 2020-01-09 DIAGNOSIS — Z122 Encounter for screening for malignant neoplasm of respiratory organs: Secondary | ICD-10-CM

## 2020-01-09 DIAGNOSIS — E1169 Type 2 diabetes mellitus with other specified complication: Secondary | ICD-10-CM | POA: Diagnosis not present

## 2020-01-09 DIAGNOSIS — Z8673 Personal history of transient ischemic attack (TIA), and cerebral infarction without residual deficits: Secondary | ICD-10-CM

## 2020-01-09 DIAGNOSIS — J309 Allergic rhinitis, unspecified: Secondary | ICD-10-CM

## 2020-01-09 DIAGNOSIS — Z23 Encounter for immunization: Secondary | ICD-10-CM | POA: Diagnosis not present

## 2020-01-09 DIAGNOSIS — I10 Essential (primary) hypertension: Secondary | ICD-10-CM

## 2020-01-09 DIAGNOSIS — E11319 Type 2 diabetes mellitus with unspecified diabetic retinopathy without macular edema: Secondary | ICD-10-CM | POA: Diagnosis not present

## 2020-01-09 DIAGNOSIS — Z1211 Encounter for screening for malignant neoplasm of colon: Secondary | ICD-10-CM

## 2020-01-09 DIAGNOSIS — E785 Hyperlipidemia, unspecified: Secondary | ICD-10-CM

## 2020-01-09 DIAGNOSIS — L600 Ingrowing nail: Secondary | ICD-10-CM | POA: Insufficient documentation

## 2020-01-09 DIAGNOSIS — G4733 Obstructive sleep apnea (adult) (pediatric): Secondary | ICD-10-CM

## 2020-01-09 DIAGNOSIS — F17200 Nicotine dependence, unspecified, uncomplicated: Secondary | ICD-10-CM

## 2020-01-09 LAB — POCT GLYCOSYLATED HEMOGLOBIN (HGB A1C): HbA1c, POC (controlled diabetic range): 7 % (ref 0.0–7.0)

## 2020-01-09 MED ORDER — CLOTRIMAZOLE 1 % EX CREA: 1.0000 "application " | TOPICAL_CREAM | Freq: Two times a day (BID) | CUTANEOUS | 0 refills | Status: DC

## 2020-01-09 MED ORDER — CLOPIDOGREL BISULFATE 75 MG PO TABS: 75.0000 mg | ORAL_TABLET | Freq: Every day | ORAL | 0 refills | Status: DC

## 2020-01-09 MED ORDER — FLUTICASONE PROPIONATE 50 MCG/ACT NA SUSP: 1.0000 | Freq: Every day | NASAL | 2 refills | Status: DC

## 2020-01-09 MED ORDER — FEXOFENADINE HCL 180 MG PO TABS: 180.0000 mg | ORAL_TABLET | Freq: Every day | ORAL | 3 refills | Status: DC

## 2020-01-09 MED ORDER — ATORVASTATIN CALCIUM 80 MG PO TABS: 80.0000 mg | ORAL_TABLET | Freq: Every day | ORAL | 3 refills | Status: DC

## 2020-01-09 MED ORDER — AMLODIPINE BESYLATE 10 MG PO TABS: 10.0000 mg | ORAL_TABLET | Freq: Every day | ORAL | 3 refills | Status: DC

## 2020-01-09 NOTE — Assessment & Plan Note (Signed)
Prev saw neuro sleep specialist. Encouraged him to get in touch with sleep office to rescheudle his sleep study

## 2020-01-09 NOTE — Progress Notes (Signed)
  Date of Visit: 01/09/2020   SUBJECTIVE:   HPI:  Jonathan [name] presents today for routine follow up.  Diabetes - currently taking metformin 500mg  twice daily. Does not check sugars at home.  Hyperlipidemia - taking atorv 40mg  daily but has been out for a while. Last LDL 134. Agreeable to increasing dose.  Hypertension - taking lisinopril 20mg  daily and amlodipine 5mg  daily. Does not check blood pressure at home.  History of stroke - no longer taking plavix because he ran out, agreeable to restarting.  History of OSA - was supposed to get sleep study but this was canceled due to issues with his eyes, plans to call them to get it rescheduled.  Allergies - requests refill of allegra and flonase. These help his allergies.  Ingrown toenail - located on R great toe lateral fold. Has hurt for about a month.  Anxiety - brought up by wife during visit. Patient admits to feeling anxious but is not sure it bothers him enough to do anything about it in terms of treatment (medication or counseling). Denies SI/HI.  Smoking - continues to smoke 1ppd x50 years. Not interested in quitting at this time. Patient does report he was exposed to asbestos when previously working in [employer], has been nervous to get CT scan for lung cancer screening because of this history   OBJECTIVE:   BP (!) 150/82   Pulse 71   Wt 208 lb 12.8 oz (94.7 kg)   SpO2 97%   BMI 33.70 kg/m  Gen: no acute distress, pleasant, cooperative HEENT: normocephalic, atraumatic  Heart: regular rate and rhythm, no murmur Lungs: clear to auscultation bilaterally, normal work of breathing  Neuro: alert, speech normal Diabetic foot exam: 2+ DP pulses bilat, normal monofilament testing bilaterally. +erythema and tenderness at distal aspect of R great toe along lateral nail fold, without fluctuance or drainage. +maceration and white material between toes on feet, L>R  ASSESSMENT/PLAN:   Health maintenance:  -cologuard ordered -foot  exam done today -flu shot and pneumovax 23 vaccines today -has received COVID vaccines already -ordered low dose CT scan for lung cancer screening  Essential hypertension Uncontrolled. Increase amlodipine to 10mg  daily. Follow up in 1 week for RN blood pressure check.  Allergic rhinitis Refill allegra and flonase, this regimen works well  History of stroke Refilled plavix, needs to be on this given history of stroke  HLD (hyperlipidemia) Last LDL elevated at 134. Increase atorvastatin to 80mg  daily.  OSA (obstructive sleep apnea) Prev saw neuro sleep specialist. Encouraged him to get in touch with sleep office to rescheudle his sleep study  Tobacco use disorder Not interested in quitting currently. 50 pack year history.  Encouraged him to reach out to me if he decides he is interested in quitting. Low dose CT scan of lungs ordered today.  Type 2 diabetes mellitus (HCC) Well controlled with A1c of 7.0 today. Continue metformin.  Ingrown toenail Did not have time to fully address or do any procedures on this today. Refer to podiatry, would benefit from diabetic foot care anyways. Did send in clotrimazole due to tinea pedis noted on diabetic foot exam.  FOLLOW UP: Follow up in 1 week for RN blood pressure check Follow up with me in 3 months   Jonathan [name], Jonathan [name]

## 2020-01-09 NOTE — Assessment & Plan Note (Signed)
Not interested in quitting currently. 50 pack year history.  Encouraged him to reach out to me if he decides he is interested in quitting. Low dose CT scan of lungs ordered today.

## 2020-01-09 NOTE — Assessment & Plan Note (Signed)
Refill allegra and flonase, this regimen works well

## 2020-01-09 NOTE — Assessment & Plan Note (Signed)
Well controlled with A1c of 7.0 today. Continue metformin.

## 2020-01-09 NOTE — Assessment & Plan Note (Signed)
Uncontrolled. Increase amlodipine to 10mg  daily. Follow up in 1 week for RN blood pressure check.

## 2020-01-09 NOTE — Assessment & Plan Note (Signed)
Last LDL elevated at 134. Increase atorvastatin to 80mg  daily.

## 2020-01-09 NOTE — Assessment & Plan Note (Signed)
Refilled plavix, needs to be on this given history of stroke

## 2020-01-09 NOTE — Patient Instructions (Addendum)
It was great to see you again today!  Refilled plavix, allegra, flonase, atorvastatin (higher dose)  Call the sleep study center about scheduling the sleep study  Flu shot and pneumonia shots today  Ordered cologuard, should come to your house and you mail it back.  Scheduling CT scan of your chest for lung cancer screening  Let me know if you decide you want to treat anxiety or talk more about quitting smoking  Sent in cream for between your toes. Referring to foot doctor.  Increase amlodipine to 10mg  daily. Follow up in 1 week for blood pressure check with nurse.  Schedule appointment with me in 3 months   Be well, Dr. Ardelia Mems

## 2020-01-09 NOTE — Assessment & Plan Note (Signed)
Did not have time to fully address or do any procedures on this today. Refer to podiatry, would benefit from diabetic foot care anyways. Did send in clotrimazole due to tinea pedis noted on diabetic foot exam.

## 2020-01-14 ENCOUNTER — Telehealth: Payer: Self-pay | Admitting: Family Medicine

## 2020-01-14 MED ORDER — FEXOFENADINE HCL 180 MG PO TABS: 180.0000 mg | ORAL_TABLET | Freq: Every day | ORAL | 3 refills | Status: DC

## 2020-01-14 MED ORDER — FLUTICASONE PROPIONATE 50 MCG/ACT NA SUSP: 1.0000 | Freq: Every day | NASAL | 2 refills | Status: DC

## 2020-01-14 NOTE — Telephone Encounter (Signed)
Received message from patient's wife that patient needed allergy medications. Prescriptions sent to pharmacy. These are also available OTC. Please let patient know.  Dorris Singh, MD  Family Medicine Teaching Service

## 2020-01-14 NOTE — Telephone Encounter (Signed)
LVM for patient concerning RX.  Jonathan [name], [name]

## 2020-01-15 ENCOUNTER — Ambulatory Visit (HOSPITAL_BASED_OUTPATIENT_CLINIC_OR_DEPARTMENT_OTHER)
Admission: RE | Admit: 2020-01-15 | Discharge: 2020-01-15 | Disposition: A | Discharge status: Home / Self Care | Payer: Medicare Other | Source: Ambulatory Visit | Attending: Family Medicine | Admitting: Family Medicine

## 2020-01-15 ENCOUNTER — Other Ambulatory Visit: Payer: Self-pay

## 2020-01-15 DIAGNOSIS — I2510 Atherosclerotic heart disease of native coronary artery without angina pectoris: Secondary | ICD-10-CM | POA: Diagnosis not present

## 2020-01-15 DIAGNOSIS — I700 Atherosclerosis of aorta: Secondary | ICD-10-CM | POA: Diagnosis not present

## 2020-01-15 DIAGNOSIS — F17200 Nicotine dependence, unspecified, uncomplicated: Secondary | ICD-10-CM | POA: Insufficient documentation

## 2020-01-15 DIAGNOSIS — K760 Fatty (change of) liver, not elsewhere classified: Secondary | ICD-10-CM | POA: Insufficient documentation

## 2020-01-15 DIAGNOSIS — J432 Centrilobular emphysema: Secondary | ICD-10-CM | POA: Diagnosis not present

## 2020-01-15 DIAGNOSIS — Z122 Encounter for screening for malignant neoplasm of respiratory organs: Secondary | ICD-10-CM | POA: Diagnosis not present

## 2020-01-15 DIAGNOSIS — F17210 Nicotine dependence, cigarettes, uncomplicated: Secondary | ICD-10-CM | POA: Diagnosis not present

## 2020-01-16 ENCOUNTER — Ambulatory Visit (INDEPENDENT_AMBULATORY_CARE_PROVIDER_SITE_OTHER): Payer: Medicare Other | Admitting: Podiatry

## 2020-01-16 DIAGNOSIS — M79674 Pain in right toe(s): Secondary | ICD-10-CM | POA: Diagnosis not present

## 2020-01-16 DIAGNOSIS — L600 Ingrowing nail: Secondary | ICD-10-CM | POA: Diagnosis not present

## 2020-01-16 DIAGNOSIS — E119 Type 2 diabetes mellitus without complications: Secondary | ICD-10-CM

## 2020-01-16 MED ORDER — CEPHALEXIN 500 MG PO CAPS: 500.0000 mg | ORAL_CAPSULE | Freq: Three times a day (TID) | ORAL | 0 refills | Status: DC

## 2020-01-16 NOTE — Patient Instructions (Signed)
If the toenail still causes any issues in the next 1-2 weeks, please call me.   Soak Instructions- have someone else check the temperature of the water    THE DAY AFTER THE PROCEDURE  Place 1/4 cup of epsom salts in a quart of warm tap water.  Submerge your foot or feet with outer bandage intact for the initial soak; this will allow the bandage to become moist and wet for easy lift off.  Once you remove your bandage, continue to soak in the solution for 20 minutes.  This soak should be done twice a day.  Next, remove your foot or feet from solution, blot dry the affected area and cover.  You may use a band aid large enough to cover the area or use gauze and tape.  Apply other medications to the area as directed by the doctor such as polysporin neosporin.  IF YOUR SKIN BECOMES IRRITATED WHILE USING THESE INSTRUCTIONS, IT IS OKAY TO SWITCH TO  WHITE VINEGAR AND WATER. Or you may use antibacterial soap and water to keep the toe clean  Monitor for any signs/symptoms of infection. Call the office immediately if any occur or go directly to the emergency room. Call with any questions/concerns.

## 2020-01-20 NOTE — Progress Notes (Signed)
Subjective:   Patient ID: Jonathan [name], male   DOB: 69 y.o.   MRN: [id number]   HPI 69 year old male presents the office today for concerns of ingrown toe of the right big toe, distal aspect which causes some occasional discomfort which is new on the last couple of months.  Denies any drainage or pus any swelling or redness.  He is diabetic and last A1c was 7 on September 16.  No other concerns today.  Review of Systems  All other systems reviewed and are negative.  Past Medical History:  Diagnosis Date  . Anxiety   . Arthritis   . COPD (chronic obstructive pulmonary disease) ([place])   . Depression   . Diabetes mellitus without complication ([place])   . Diarrhea   . History of kidney stones   . HTN (hypertension)   . Hyperlipidemia   . Hypertensive retinopathy    OU  . Pneumonia   . Retinal detachment    Rheg. RD OD  . Sleep apnea    does not use cpap  . Stroke [facility])    denies any deficits    Past Surgical History:  Procedure Laterality Date  . CATARACT EXTRACTION Bilateral    Dr. [name]  . CIRCUMCISION    . EYE SURGERY Bilateral    Cat Sx OU; RD repair OD  . GAS INSERTION Right 03/13/2019   Procedure: INSERTION OF GAS (C3F8) RIGHT EYE;  Surgeon: [name], MD;  Location: [place];  Service: Ophthalmology;  Laterality: Right;  . LASER PHOTO ABLATION Right 03/13/2019   Procedure: LASER PHOTO  ABLATION RIGHT EYE;  Surgeon: [name], MD;  Location: [place];  Service: Ophthalmology;  Laterality: Right;  . none    . PARS PLANA VITRECTOMY Right 03/13/2019   Procedure: 25 GAUGE PARS PLANA VITRECTOMY WITH  INTRAOCULAR LENSE EXPLANTATION RIGHT EYE.;  Surgeon: [name], MD;  Location: [place];  Service: Ophthalmology;  Laterality: Right;  . PARS PLANA VITRECTOMY Right 06/27/2019   Procedure: PARS PLANA VITRECTOMY WITH 25 GAUGE;  Surgeon: [name], MD;  Location: [place];  Service: Ophthalmology;  Laterality: Right;  . PHOTOCOAGULATION WITH LASER Left 03/13/2019    Procedure: INDIRECT PHOTOCOAGULATION WITH LASER LEFT EYE;  Surgeon: [name], MD;  Location: [place];  Service: Ophthalmology;  Laterality: Left;  . PLACEMENT AND SUTURE OF SECONDARY INTRAOCULAR LENS Right 06/27/2019   Procedure: PLACEMENT AND SUTURE OF SECONDARY INTRAOCULAR LENS;  Surgeon: [name], MD;  Location: [place];  Service: Ophthalmology;  Laterality: Right;  . RETINAL DETACHMENT SURGERY Right 03/13/2019   PPV for repair of rheg. RD - Dr. [name]     Current Outpatient Medications:  .  acetaminophen (TYLENOL) 500 MG tablet, Take 1,000 mg by mouth every 6 (six) hours as needed for moderate pain or headache., Disp: , Rfl:  .  amLODipine (NORVASC) 10 MG tablet, Take 1 tablet (10 mg total) by mouth daily., Disp: 90 tablet, Rfl: 3 .  atorvastatin (LIPITOR) 80 MG tablet, Take 1 tablet (80 mg total) by mouth daily., Disp: 90 tablet, Rfl: 3 .  Blood Glucose Monitoring Suppl (ONETOUCH VERIO) w/Device KIT, Check blood sugar once per day, Disp: 1 kit, Rfl: 0 .  cephALEXin (KEFLEX) 500 MG capsule, Take 1 capsule (500 mg total) by mouth 3 (three) times daily., Disp: 21 capsule, Rfl: 0 .  clopidogrel (PLAVIX) 75 MG tablet, Take 1 tablet (75 mg total) by mouth daily., Disp: 90 tablet, Rfl: 0 .  clotrimazole (LOTRIMIN) 1 %  cream, Apply 1 application topically 2 (two) times daily. Between toes, Disp: 30 g, Rfl: 0 .  fexofenadine (ALLEGRA ALLERGY) 180 MG tablet, Take 1 tablet (180 mg total) by mouth daily., Disp: 30 tablet, Rfl: 3 .  fluticasone (FLONASE) 50 MCG/ACT nasal spray, Place 1 spray into both nostrils daily., Disp: 16 g, Rfl: 2 .  glucose blood (ONETOUCH VERIO) test strip, Check sugar once per day, Disp: 100 each, Rfl: 3 .  Lancet Devices (ONE TOUCH DELICA LANCING DEV) MISC, Check sugar once per day, Disp: 1 each, Rfl: 0 .  lisinopril (ZESTRIL) 20 MG tablet, Take 1 tablet (20 mg total) by mouth daily., Disp: 90 tablet, Rfl: 3 .  metFORMIN (GLUCOPHAGE) 500 MG tablet, Take 1 tablet (500  mg total) by mouth 2 (two) times daily with a meal., Disp: 180 tablet, Rfl: 3 .  OneTouch Delica Lancets 88P MISC, Check sugar once per day, Disp: 100 each, Rfl: 3  No Known Allergies       Objective:  Physical Exam  General: AAO x3, NAD  Dermatological: Incurvation present to the distal aspect of the right hallux toenail without any edema, erythema, drainage or pus or any signs of infection.  There is no open lesion identified today.  Vascular: Dorsalis Pedis artery and Posterior Tibial artery pedal pulses are 2/4 bilateral with immedate capillary fill time.There is no pain with calf compression, swelling, warmth, erythema.   Neruologic: Grossly intact via light touch bilateral.  Musculoskeletal: No gross boney pedal deformities bilateral. No pain, crepitus, or limitation noted with foot and ankle range of motion bilateral. Muscular strength 5/5 in all groups tested bilateral.  Gait: Unassisted, Nonantalgic.       Assessment:   Ingrown toenail right hallux     Plan:  -Treatment options discussed including all alternatives, risks, and complications -Etiology of symptoms were discussed -I discussed with him partial nail avulsion however he wants to hold off on this.  I sharply debride the nail with any complications or bleeding.  Recommend Epson salt soaks daily as well as antibiotic ointment.  If symptoms continue or worsen will need to have a partial nail avulsion performed.  Trula Slade DPM

## 2020-01-31 ENCOUNTER — Telehealth: Payer: Self-pay | Admitting: Family Medicine

## 2020-01-31 NOTE — Telephone Encounter (Signed)
Called patient to review CT lung screening results Discussed w/ him and his wife over the phone.  No signs of malignancy Does show CAD and COPD. Patient denies shortness of breath or chest pain. Discussed importance of smoking cessation to limit his risk of heart attack. Patient is already on antiplatelet & statin.  Patient appreciative.  Leeanne Rio, MD

## 2020-03-26 ENCOUNTER — Other Ambulatory Visit: Payer: Self-pay

## 2020-03-26 ENCOUNTER — Ambulatory Visit (INDEPENDENT_AMBULATORY_CARE_PROVIDER_SITE_OTHER): Payer: Medicare Other | Admitting: Family Medicine

## 2020-03-26 VITALS — BP 148/72 | HR 58 | Wt 208.4 lb

## 2020-03-26 DIAGNOSIS — E119 Type 2 diabetes mellitus without complications: Secondary | ICD-10-CM | POA: Diagnosis not present

## 2020-03-26 DIAGNOSIS — F17200 Nicotine dependence, unspecified, uncomplicated: Secondary | ICD-10-CM

## 2020-03-26 DIAGNOSIS — I10 Essential (primary) hypertension: Secondary | ICD-10-CM | POA: Diagnosis not present

## 2020-03-26 DIAGNOSIS — R198 Other specified symptoms and signs involving the digestive system and abdomen: Secondary | ICD-10-CM

## 2020-03-26 DIAGNOSIS — Z1211 Encounter for screening for malignant neoplasm of colon: Secondary | ICD-10-CM | POA: Diagnosis not present

## 2020-03-26 DIAGNOSIS — I700 Atherosclerosis of aorta: Secondary | ICD-10-CM

## 2020-03-26 DIAGNOSIS — Z716 Tobacco abuse counseling: Secondary | ICD-10-CM

## 2020-03-26 DIAGNOSIS — E11319 Type 2 diabetes mellitus with unspecified diabetic retinopathy without macular edema: Secondary | ICD-10-CM | POA: Diagnosis not present

## 2020-03-26 DIAGNOSIS — Z23 Encounter for immunization: Secondary | ICD-10-CM

## 2020-03-26 DIAGNOSIS — R140 Abdominal distension (gaseous): Secondary | ICD-10-CM | POA: Diagnosis not present

## 2020-03-26 LAB — POCT GLYCOSYLATED HEMOGLOBIN (HGB A1C): Hemoglobin A1C: 7.5 % — AB (ref 4.0–5.6)

## 2020-03-26 MED ORDER — METFORMIN HCL 1000 MG PO TABS: 1000.0000 mg | ORAL_TABLET | Freq: Two times a day (BID) | ORAL | 1 refills | Status: DC

## 2020-03-26 MED ORDER — LISINOPRIL 40 MG PO TABS: 40.0000 mg | ORAL_TABLET | Freq: Every day | ORAL | 0 refills | Status: DC

## 2020-03-26 NOTE — Progress Notes (Signed)
  Date of Visit: 03/26/2020   SUBJECTIVE:   HPI:  Jonathan [name] presents today for routine follow up.  Anxiety - noticing some anxiety when he has to go to the store with his family. Does not like being in stores. Mostly he gets frustrated at having to spend long times in the store, since they like to walk around and shop. Is able to go in and get the things he needs and leave. Not impacting his functioning. No other areas of anxiety or particular worries. Denies SI/HI.  Diabetes - currently taking metformin $RemoveBeforeDE'500mg'gVkSbjhibwzcNCJ$  twice daily. A1c today 7.5.  Abdominal distension - for several months has noticed feeling like his abdomen is swollen with fluid. No abdominal pain, just full. No fevers. Does not drink alcohol presently, but previously used to drink very heavily for about 18 years. Has been sober for 37 years. Denies history of hepatitis or liver issues in the past.   Smoking - smokes 1/2 ppd. Has decreased down from 1ppd, trying to quit.  Hypertension - last visit increased amlodipine to $RemoveBefor'10mg'YsmVZLneSpxL$  daily. Does not check blood pressure at home. Also taking lisinopril $RemoveBeforeDEI'20mg'FEaOkhyUyanCfaqZ$  daily.   OBJECTIVE:   BP (!) 148/72   Pulse (!) 58   Wt 208 lb 6.4 oz (94.5 kg)   SpO2 96%   BMI 33.64 kg/m  Gen: no acute distress, pleasant, cooperative, well appearing HEENT: normocephalic, atraumatic  Heart: regular rate and rhythm, no murmur Lungs: clear to auscultation bilaterally, normal work of breathing Abdomen: soft but is full with what I believe may be a fluid wave. No tenderness or organomegaly appreciated.  Neuro: alert, speech normal, grossly nofocal Ext: No appreciable lower extremity edema bilaterally  Psych: normal range of affect, well groomed, speech normal in rate and volume, normal eye contact   ASSESSMENT/PLAN:   Health maintenance:  -COVID booster given today -still has not turned in cologuard, does not have kit at home anymore. Discussed options for colon cancer screening. Patient elects colonoscopy.  Referral entered.  Essential hypertension Uncontrolled. Increase lisinopril to $RemoveBefor'40mg'YvrtWeVPjWKV$  daily. Follow up in 1 week for nurse BP check and repeat BMET.  Tobacco use disorder Congratulated patient on the changes he's made to get down to 1/2 ppd. Offered to discuss other techniques for quitting if he'd like in the future.  Type 2 diabetes mellitus (H[facility]) A1c increased slightly to 7.5. will increase metformin to $RemoveBefo'1000mg'ayFZzwtAoIT$  twice daily. Follow up in 3 months for A1c recheck.  Aortic atherosclerosis ([place]) Noted on CT. Is on statin and antiplatelet.  Abdominal fullness Exam concerning for possible ascites (I do think I appreciate a fluid wave). Not overly distended or tender to raise concern for SBP or acute abdominal pathology. At risk for cirrhosis given his history of very heavy alcohol use years ago. Check labs today - CMET, CBC, hepatitis panel. Obtain abdominal u/s to further assess.   FOLLOW UP: Follow up in 1 mo for above issues Referring to GI for colonoscopy Nurse BP check in 1 week  [place] J. Ardelia Mems, [place]

## 2020-03-26 NOTE — Progress Notes (Signed)
   Covid-19 Vaccination Clinic  Name:  Jonathan [name]    MRN: [id number] DOB: 02-Feb-1951  03/26/2020  [name] was observed post Covid-19 immunization for 15 minutes without incident. He was provided with Vaccine Information Sheet and instruction to access the V-Safe system.   [name] was instructed to call 911 with any severe reactions post vaccine: Marland Kitchen Difficulty breathing  . Swelling of face and throat  . A fast heartbeat  . A bad rash all over body  . Dizziness and weakness

## 2020-03-26 NOTE — Patient Instructions (Signed)
Referring for colonoscopy  COVID booster today  Increase metformin to 1000mg  twice daily   Getting ultrasound of your abdomen  Labs today  Follow up with me in 1 month  Be well, Dr. Ardelia Mems

## 2020-03-27 ENCOUNTER — Encounter: Payer: Self-pay | Admitting: Family Medicine

## 2020-03-27 LAB — CMP14+EGFR
ALT: 41 IU/L (ref 0–44)
AST: 21 IU/L (ref 0–40)
Albumin/Globulin Ratio: 1.5 (ref 1.2–2.2)
Albumin: 4.6 g/dL (ref 3.8–4.8)
Alkaline Phosphatase: 124 IU/L — ABNORMAL HIGH (ref 44–121)
BUN/Creatinine Ratio: 11 (ref 10–24)
BUN: 12 mg/dL (ref 8–27)
Bilirubin Total: 0.2 mg/dL (ref 0.0–1.2)
CO2: 21 mmol/L (ref 20–29)
Calcium: 9 mg/dL (ref 8.6–10.2)
Chloride: 103 mmol/L (ref 96–106)
Creatinine, Ser: 1.13 mg/dL (ref 0.76–1.27)
GFR calc Af Amer: 77 mL/min/{1.73_m2} (ref >59)
GFR calc non Af Amer: 66 mL/min/{1.73_m2} (ref >59)
Globulin, Total: 3.1 g/dL (ref 1.5–4.5)
Glucose: 123 mg/dL — ABNORMAL HIGH (ref 65–99)
Potassium: 4.4 mmol/L (ref 3.5–5.2)
Sodium: 138 mmol/L (ref 134–144)
Total Protein: 7.7 g/dL (ref 6.0–8.5)

## 2020-03-27 LAB — CBC
Hematocrit: 44.9 % (ref 37.5–51.0)
Hemoglobin: 15.8 g/dL (ref 13.0–17.7)
MCH: 30.7 pg (ref 26.6–33.0)
MCHC: 35.2 g/dL (ref 31.5–35.7)
MCV: 87 fL (ref 79–97)
Platelets: 236 10*3/uL (ref 150–450)
RBC: 5.15 x10E6/uL (ref 4.14–5.80)
RDW: 12.2 % (ref 11.6–15.4)
WBC: 6.6 10*3/uL (ref 3.4–10.8)

## 2020-03-27 LAB — HEPATITIS PANEL, ACUTE
Hep A IgM: NEGATIVE
Hep B C IgM: NEGATIVE
Hep C Virus Ab: <0.1 s/co ratio (ref 0.0–0.9)
Hepatitis B Surface Ag: NEGATIVE

## 2020-03-28 ENCOUNTER — Encounter: Payer: Self-pay | Admitting: Family Medicine

## 2020-03-28 DIAGNOSIS — I700 Atherosclerosis of aorta: Secondary | ICD-10-CM | POA: Insufficient documentation

## 2020-03-28 DIAGNOSIS — R198 Other specified symptoms and signs involving the digestive system and abdomen: Secondary | ICD-10-CM | POA: Insufficient documentation

## 2020-03-28 NOTE — Assessment & Plan Note (Addendum)
Exam concerning for possible ascites (I do think I appreciate a fluid wave). Not overly distended or tender to raise concern for SBP or acute abdominal pathology. At risk for cirrhosis given his history of very heavy alcohol use years ago. Check labs today - CMET, CBC, hepatitis panel. Obtain abdominal u/s to further assess.

## 2020-03-28 NOTE — Assessment & Plan Note (Signed)
Congratulated patient on the changes he's made to get down to 1/2 ppd. Offered to discuss other techniques for quitting if he'd like in the future.

## 2020-03-28 NOTE — Assessment & Plan Note (Signed)
Uncontrolled. Increase lisinopril to 40mg  daily. Follow up in 1 week for nurse BP check and repeat BMET.

## 2020-03-28 NOTE — Assessment & Plan Note (Signed)
A1c increased slightly to 7.5. will increase metformin to 1000mg  twice daily. Follow up in 3 months for A1c recheck.

## 2020-03-28 NOTE — Assessment & Plan Note (Signed)
Noted on CT. Is on statin and antiplatelet.

## 2020-03-28 NOTE — Addendum Note (Signed)
Addended by: Leeanne Rio on: 03/28/2020 09:59 AM   Modules accepted: Miquel Dunn

## 2020-03-31 ENCOUNTER — Telehealth: Payer: Self-pay | Admitting: *Deleted

## 2020-03-31 NOTE — Telephone Encounter (Signed)
-----   Message from Leeanne Rio, MD sent at 03/27/2020  6:03 PM EST ----- Can you guys schedule abdominal u/s for this patient and contact him with appointment info?  Thanks Leeanne Rio, MD

## 2020-03-31 NOTE — Telephone Encounter (Signed)
Pt scheduled. LMOVM with appt info. Rudie Rikard Kennon Holter, CMA

## 2020-04-06 ENCOUNTER — Other Ambulatory Visit: Payer: Self-pay

## 2020-04-06 ENCOUNTER — Ambulatory Visit (HOSPITAL_COMMUNITY)
Admission: RE | Admit: 2020-04-06 | Discharge: 2020-04-06 | Disposition: A | Discharge status: Home / Self Care | Payer: Medicare Other | Source: Ambulatory Visit | Attending: Family Medicine | Admitting: Family Medicine

## 2020-04-06 DIAGNOSIS — R140 Abdominal distension (gaseous): Secondary | ICD-10-CM | POA: Insufficient documentation

## 2020-04-06 DIAGNOSIS — R160 Hepatomegaly, not elsewhere classified: Secondary | ICD-10-CM | POA: Diagnosis not present

## 2020-04-06 DIAGNOSIS — K7689 Other specified diseases of liver: Secondary | ICD-10-CM | POA: Diagnosis not present

## 2020-04-06 DIAGNOSIS — N281 Cyst of kidney, acquired: Secondary | ICD-10-CM | POA: Diagnosis not present

## 2020-04-07 ENCOUNTER — Telehealth: Payer: Self-pay

## 2020-04-07 DIAGNOSIS — K769 Liver disease, unspecified: Secondary | ICD-10-CM

## 2020-04-07 NOTE — Telephone Encounter (Signed)
Received phone call from [facility] with the following call report.   IMPRESSION: 1. Heterogeneous liver echotexture with increased parenchymal echogenicity. Findings may represent hepatic steatosis or other hepatocellular disease process including cirrhosis. 2. There is a 3.3 cm hypoechoic mass within the left hepatic lobe near the porta hepatis. Hepatocellular carcinoma is not excluded, particularly given the evidence of intrinsic liver disease. Further evaluation with hepatic protocol contrast-enhanced MRI is recommended for further evaluation. 3. Bilateral renal cysts.   Routing to PCP  Talbot Grumbling, RN

## 2020-04-08 NOTE — Telephone Encounter (Signed)
Attempted to reach patient about results via phone x2. No answer. LVM asking them to call back (I also am trying to reach his wife Helene Kelp to discuss her own care with her as well).  Will try again tomorrow.  Leeanne Rio, MD

## 2020-04-09 NOTE — Telephone Encounter (Signed)
Called patient again and reached him. Explained results and need for MRI to further characterize liver and mass.  Order entered.  Red team, please schedule MRI and contact patient with appointment.  Thanks! Leeanne Rio, MD

## 2020-04-14 NOTE — Telephone Encounter (Signed)
Pt scheduled and wife informed. Graden Hoshino Kennon Holter, CMA

## 2020-04-27 ENCOUNTER — Ambulatory Visit (HOSPITAL_COMMUNITY): Admission: RE | Admit: 2020-04-27 | Payer: Medicare Other | Source: Ambulatory Visit

## 2020-04-28 ENCOUNTER — Ambulatory Visit: Payer: Medicare Other | Admitting: Family Medicine

## 2020-05-07 ENCOUNTER — Other Ambulatory Visit: Payer: Self-pay

## 2020-05-07 ENCOUNTER — Ambulatory Visit (HOSPITAL_COMMUNITY)
Admission: RE | Admit: 2020-05-07 | Discharge: 2020-05-07 | Disposition: A | Discharge status: Home / Self Care | Payer: Medicare Other | Source: Ambulatory Visit | Attending: Family Medicine | Admitting: Family Medicine

## 2020-05-07 DIAGNOSIS — K769 Liver disease, unspecified: Secondary | ICD-10-CM | POA: Insufficient documentation

## 2020-05-07 DIAGNOSIS — N281 Cyst of kidney, acquired: Secondary | ICD-10-CM | POA: Diagnosis not present

## 2020-05-07 DIAGNOSIS — K760 Fatty (change of) liver, not elsewhere classified: Secondary | ICD-10-CM | POA: Diagnosis not present

## 2020-05-07 MED ORDER — GADOBUTROL 1 MMOL/ML IV SOLN
9.5000 mL | Freq: Once | INTRAVENOUS | Status: AC | PRN
  Administered 2020-05-07: 9.5 mL via INTRAVENOUS

## 2020-05-08 ENCOUNTER — Telehealth: Payer: Self-pay

## 2020-05-08 NOTE — Telephone Encounter (Signed)
Received phone call from [facility] with the following call report:  IMPRESSION: 1. Solid enhancing endophytic lesion which extend into the right renal pelvis, measuring up to 2.8 cm. Suspicious for a primary renal neoplasm. Recommend urology consult. 2. Diffuse hepatic steatosis with geographic sparing predominantly along the gallbladder fossa including a discrete focus of sparing near the porta hepatis measuring 2.8 cm corresponding with the mass-like lesion seen on prior ultrasound. 3. Multiple bilateral renal cysts, some of which are Hemorrhagic/proteinaceous.  To PCP  Talbot Grumbling, RN

## 2020-05-15 NOTE — Telephone Encounter (Signed)
Patient has an appointment with me on Tuesday - I will discuss with him in person at that visit. Leeanne Rio, MD

## 2020-05-19 ENCOUNTER — Encounter: Payer: Self-pay | Admitting: Family Medicine

## 2020-05-19 ENCOUNTER — Ambulatory Visit (INDEPENDENT_AMBULATORY_CARE_PROVIDER_SITE_OTHER): Payer: Medicare Other | Admitting: Family Medicine

## 2020-05-19 ENCOUNTER — Other Ambulatory Visit: Payer: Self-pay

## 2020-05-19 VITALS — BP 134/80 | HR 61 | Wt 208.8 lb

## 2020-05-19 DIAGNOSIS — I10 Essential (primary) hypertension: Secondary | ICD-10-CM | POA: Diagnosis not present

## 2020-05-19 DIAGNOSIS — F17200 Nicotine dependence, unspecified, uncomplicated: Secondary | ICD-10-CM | POA: Diagnosis not present

## 2020-05-19 DIAGNOSIS — Z1211 Encounter for screening for malignant neoplasm of colon: Secondary | ICD-10-CM | POA: Diagnosis not present

## 2020-05-19 DIAGNOSIS — N2889 Other specified disorders of kidney and ureter: Secondary | ICD-10-CM | POA: Diagnosis not present

## 2020-05-19 NOTE — Assessment & Plan Note (Signed)
Improved, continue current medications Update BMET today after increase in ACE

## 2020-05-19 NOTE — Assessment & Plan Note (Signed)
Continued to offer support with quitting

## 2020-05-19 NOTE — Patient Instructions (Addendum)
Referring to urologist for the spot on your kidney  Ordered cologuard, it will come to your house  Checking kidney function today  Follow up with me in March, sooner if needed.  Be well,  Dr. Ardelia Mems

## 2020-05-19 NOTE — Progress Notes (Signed)
error 

## 2020-05-19 NOTE — Progress Notes (Addendum)
  Date of Visit: 05/19/2020   SUBJECTIVE:   HPI:  Mon[place] presents today for routine follow up.  Hypertension - currently taking amlodipine 10mg  daily and lisinopril 40mg  daily (increased at last office visit). Tolerating these well. Does not check blood pressure at home.  Liver MRI follow up - here for MRI results. MRI showed mass on kidney.   Tobacco abuse - currently smoking 1/2 ppd. Not presently interested in help with quitting smoking.  OBJECTIVE:   BP 134/80   Pulse 61   Wt 208 lb 12.8 oz (94.7 kg)   SpO2 97%   BMI 33.70 kg/m  Gen: no acute distress, pleasant, cooperative HEENT: normocephalic, atraumatic  Lungs: normal work of breathing  Neuro: alert, speech normal, grossly nonfocal  ASSESSMENT/PLAN:   Health maintenance:  -now patient elects cologuard instead of colonoscopy, ordered  Essential hypertension Improved, continue current medications Update BMET today after increase in ACE  Tobacco use disorder Continued to offer support with quitting  Kidney mass Reviewed MRI results with patient and wife today, including need for urology follow up. Urgent urology referral placed today.  Mood - PHQ-9 elevated score noted, 0 on #9. Patient denies feeling depressed, states mood is not a problem. Continue to monitor.  FOLLOW UP: Follow up in March for A1c  [place] J. Ardelia Mems, [place]

## 2020-05-19 NOTE — Assessment & Plan Note (Signed)
Reviewed MRI results with patient and wife today, including need for urology follow up. Urgent urology referral placed today.

## 2020-05-20 ENCOUNTER — Encounter: Payer: Self-pay | Admitting: Family Medicine

## 2020-05-20 LAB — BASIC METABOLIC PANEL
BUN/Creatinine Ratio: 12 (ref 10–24)
BUN: 16 mg/dL (ref 8–27)
CO2: 19 mmol/L — ABNORMAL LOW (ref 20–29)
Calcium: 9.1 mg/dL (ref 8.6–10.2)
Chloride: 103 mmol/L (ref 96–106)
Creatinine, Ser: 1.34 mg/dL — ABNORMAL HIGH (ref 0.76–1.27)
GFR calc Af Amer: 62 mL/min/{1.73_m2} (ref >59)
GFR calc non Af Amer: 54 mL/min/{1.73_m2} — ABNORMAL LOW (ref >59)
Glucose: 121 mg/dL — ABNORMAL HIGH (ref 65–99)
Potassium: 4.3 mmol/L (ref 3.5–5.2)
Sodium: 139 mmol/L (ref 134–144)

## 2020-06-09 NOTE — Progress Notes (Shared)
[facility] Note  06/12/2020     CHIEF COMPLAINT Patient presents for No chief complaint on file.   HISTORY OF PRESENT ILLNESS: Jonathan [name] is a 70 y.o. male who presents to the clinic today for:   pt states his eye is doing well, his vision is getting better  Referring physician: [name], MD [place], [place] [id number]   HISTORICAL INFORMATION:   Selected notes from the MEDICAL RECORD NUMBER Referred by Dr. [name] for evaluation of dislocated IOL   CURRENT MEDICATIONS: No current outpatient medications on file. (Ophthalmic Drugs)   No current facility-administered medications for this visit. (Ophthalmic Drugs)   Current Outpatient Medications (Other)  Medication Sig  . acetaminophen (TYLENOL) 500 MG tablet Take 1,000 mg by mouth every 6 (six) hours as needed for moderate pain or headache.  Marland Kitchen amLODipine (NORVASC) 10 MG tablet Take 1 tablet (10 mg total) by mouth daily.  Marland Kitchen atorvastatin (LIPITOR) 80 MG tablet Take 1 tablet (80 mg total) by mouth daily.  . Blood Glucose Monitoring Suppl (ONETOUCH VERIO) w/Device KIT Check blood sugar once per day  . clopidogrel (PLAVIX) 75 MG tablet Take 1 tablet (75 mg total) by mouth daily.  . fexofenadine (ALLEGRA ALLERGY) 180 MG tablet Take 1 tablet (180 mg total) by mouth daily.  . fluticasone (FLONASE) 50 MCG/ACT nasal spray Place 1 spray into both nostrils daily.  Marland Kitchen glucose blood (ONETOUCH VERIO) test strip Check sugar once per day  . Lancet Devices (ONE TOUCH DELICA LANCING DEV) MISC Check sugar once per day  . lisinopril (ZESTRIL) 40 MG tablet Take 1 tablet (40 mg total) by mouth daily.  . metFORMIN (GLUCOPHAGE) 1000 MG tablet Take 1 tablet (1,000 mg total) by mouth 2 (two) times daily with a meal.  . OneTouch Delica Lancets 34H MISC Check sugar once per day   No current facility-administered medications for this visit. (Other)      REVIEW OF SYSTEMS:    ALLERGIES No Known  Allergies  PAST MEDICAL HISTORY Past Medical History:  Diagnosis Date  . Anxiety   . Arthritis   . COPD (chronic obstructive pulmonary disease) ([place])   . Depression   . Diabetes mellitus without complication ([place])   . Diarrhea   . History of kidney stones   . HTN (hypertension)   . Hyperlipidemia   . Hypertensive retinopathy    OU  . Pneumonia   . Retinal detachment    Rheg. RD OD  . Sleep apnea    does not use cpap  . Stroke [facility])    denies any deficits   Past Surgical History:  Procedure Laterality Date  . CATARACT EXTRACTION Bilateral    Dr. [name]  . CIRCUMCISION    . EYE SURGERY Bilateral    Cat Sx OU; RD repair OD  . GAS INSERTION Right 03/13/2019   Procedure: INSERTION OF GAS (C3F8) RIGHT EYE;  Surgeon: [name], MD;  Location: [place];  Service: Ophthalmology;  Laterality: Right;  . LASER PHOTO ABLATION Right 03/13/2019   Procedure: LASER PHOTO  ABLATION RIGHT EYE;  Surgeon: [name], MD;  Location: [place];  Service: Ophthalmology;  Laterality: Right;  . none    . PARS PLANA VITRECTOMY Right 03/13/2019   Procedure: 25 GAUGE PARS PLANA VITRECTOMY WITH  INTRAOCULAR LENSE EXPLANTATION RIGHT EYE.;  Surgeon: [name], MD;  Location: [employer];  Service: Ophthalmology;  Laterality: Right;  . PARS PLANA VITRECTOMY Right  06/27/2019   Procedure: PARS PLANA VITRECTOMY WITH 25 GAUGE;  Surgeon: [name], MD;  Location: [place];  Service: Ophthalmology;  Laterality: Right;  . PHOTOCOAGULATION WITH LASER Left 03/13/2019   Procedure: INDIRECT PHOTOCOAGULATION WITH LASER LEFT EYE;  Surgeon: [name], MD;  Location: [place];  Service: Ophthalmology;  Laterality: Left;  . PLACEMENT AND SUTURE OF SECONDARY INTRAOCULAR LENS Right 06/27/2019   Procedure: PLACEMENT AND SUTURE OF SECONDARY INTRAOCULAR LENS;  Surgeon: [name], MD;  Location: [place];  Service: Ophthalmology;  Laterality: Right;  . RETINAL DETACHMENT SURGERY Right 03/13/2019   PPV for repair of rheg. RD -  Dr. [name]    FAMILY HISTORY Family History  Problem Relation Age of Onset  . Cancer Mother        brain tumor  . Heart disease Father   . Cirrhosis Father     SOCIAL HISTORY Social History   Tobacco Use  . Smoking status: Current Every Day Smoker    Packs/day: 0.50    Years: 43.00    Pack years: 21.50    Types: Cigarettes  . Smokeless tobacco: Never Used  . Tobacco comment: pt is trying to aggressively cut back on # cigs smoked daily.   Vaping Use  . Vaping Use: Never used  Substance Use Topics  . Alcohol use: No  . Drug use: No         OPHTHALMIC EXAM:  Not recorded     IMAGING AND PROCEDURES  Imaging and Procedures for _0 @           ASSESSMENT/PLAN:    ICD-10-CM   1. Right retinal detachment  H33.21   2. Retinal edema  H35.81   3. Dislocation of intraocular lens, sequela  T85.22XS   4. Retinal breaks without detachment  H35.89   5. Essential hypertension  I10   6. Hypertensive retinopathy of both eyes  H35.033   7. Diabetes mellitus type 2 without retinopathy ([place])  E11.9   8. Pseudophakia of both eyes  Z96.1   9. Dislocation of intraocular lens, initial encounter  T85.22XA     1,2. Rhegmatogenous retinal detachment, right eye  - bullous superior mac off detachment, onset ~1 wk, with onset of foveal involvement Tuesday, 03/12/19 by pt history  - detached from 1000 to 0130, fovea off, multiple tears at 1030 and 1100  - s/p PPV/IOL explanation/EL/FAX/14% C3F8 OD, 11.18.20             - retina attached and in good position  3. Dislocated IOL OD  - s/p CE/IOL OD on 10.21.20 w/ Dr. [name]  - IOL displaced inferiorly with superior haptic hung up in capsular remnant   - poor dilation -- ?pseudoexfoliation, floppy iris OD  - developed macula/fovea involving RD as above  - s/p IOL explantation during RD repair 11.18.20 as above  - s/p secondary sutured Akreos IOL 03.04.21             - doing well with vision improved to 20/30 today by  MRx  - 1200 sutures removed at slit lamp,  04.23.21  - 0900 suture removed at slit lamp, 6.17.21  - 0300 suture removed at slit lamp, 07.16.21             - retina attached and in good position             - IOP 11 today  - cont Cosopt BID OD -- decrease to Qdaily  - pt is cleared from a  retina standpoint to get new MRx  - f/u 6 months, DFE, OCT  4. Retinal breaks without detachment OS  - scleral depressed exam of OS completed as part of RD work up  - large retinal break at 12, smaller flap tears at 0430 amd 0730  - s/p laser retinopexy OS (11.18.20) -- good laser around breaks at 1200, 0430 and 0730  5,6. Hypertensive retinopathy OU  - discussed importance of tight BP control  - monitor  7. Diabetes mellitus, type 2 without retinopathy  - The incidence, risk factors for progression, natural history and treatment options for diabetic retinopathy  were discussed with patient.    - The need for close monitoring of blood glucose, blood pressure, and serum lipids, avoiding cigarette or any type of tobacco, and the need for long term follow up was also discussed with patient.  - f/u in 1 year, sooner prn  8. Pseudophakia OU  - s/p CE/IOL OU (Dr. [name], October 2020)  - subluxed IOL OD as above - s/p IOL explantation on 11.18.2020, sutured IOL on 3.4.21  - OS doing well  - monitor   Ophthalmic Meds Ordered this visit:  No orders of the defined types were placed in this encounter.      No follow-ups on file.  There are no Patient Instructions on file for this visit.   Explained the diagnoses, plan, and follow up with the patient and they expressed understanding.  Patient expressed understanding of the importance of proper follow up care.   This document serves as a record of services personally performed by Gardiner Sleeper, MD, PhD. It was created on their behalf by San Jetty. Owens Shark, OA an ophthalmic technician. The creation of this record is the provider's dictation and/or  activities during the visit.    Electronically signed by: San Jetty. Marguerita Merles 02.15.2022 12:44 PM   Gardiner Sleeper, M.D., Ph.D. Diseases & Surgery of the Retina and Vitreous [facility]: M myopia (nearsighted); A astigmatism; H hyperopia (farsighted); P presbyopia; Mrx spectacle prescription;  CTL contact lenses; OD right eye; OS left eye; OU both eyes  XT exotropia; ET esotropia; PEK punctate epithelial keratitis; PEE punctate epithelial erosions; DES dry eye syndrome; MGD meibomian gland dysfunction; ATs artificial tears; PFAT's preservative free artificial tears; [place] nuclear sclerotic cataract; PSC posterior subcapsular cataract; ERM epi-retinal membrane; PVD posterior vitreous detachment; RD retinal detachment; DM diabetes mellitus; DR diabetic retinopathy; NPDR non-proliferative diabetic retinopathy; PDR proliferative diabetic retinopathy; CSME clinically significant macular edema; DME diabetic macular edema; dbh dot blot hemorrhages; CWS cotton wool spot; POAG primary open angle glaucoma; C/D cup-to-disc ratio; HVF humphrey visual field; GVF goldmann visual field; OCT optical coherence tomography; IOP intraocular pressure; BRVO Branch retinal vein occlusion; CRVO central retinal vein occlusion; CRAO central retinal artery occlusion; BRAO branch retinal artery occlusion; RT retinal tear; SB scleral buckle; PPV pars plana vitrectomy; VH Vitreous hemorrhage; PRP panretinal laser photocoagulation; IVK intravitreal kenalog; VMT vitreomacular traction; MH Macular hole;  NVD neovascularization of the disc; NVE neovascularization elsewhere; AREDS age related eye disease study; ARMD age related macular degeneration; POAG primary open angle glaucoma; EBMD epithelial/anterior basement membrane dystrophy; ACIOL anterior chamber intraocular lens; IOL intraocular lens; PCIOL posterior chamber intraocular lens; Phaco/IOL phacoemulsification with intraocular lens placement;  [place] photorefractive keratectomy; LASIK laser assisted in situ keratomileusis; HTN hypertension; DM diabetes mellitus; COPD chronic obstructive pulmonary disease

## 2020-06-12 ENCOUNTER — Encounter (INDEPENDENT_AMBULATORY_CARE_PROVIDER_SITE_OTHER): Payer: Medicare Other | Admitting: Ophthalmology

## 2020-06-12 DIAGNOSIS — H3321 Serous retinal detachment, right eye: Secondary | ICD-10-CM

## 2020-06-12 DIAGNOSIS — H3589 Other specified retinal disorders: Secondary | ICD-10-CM

## 2020-06-12 DIAGNOSIS — Z961 Presence of intraocular lens: Secondary | ICD-10-CM

## 2020-06-12 DIAGNOSIS — T8522XA Displacement of intraocular lens, initial encounter: Secondary | ICD-10-CM

## 2020-06-12 DIAGNOSIS — H3581 Retinal edema: Secondary | ICD-10-CM

## 2020-06-12 DIAGNOSIS — T8522XS Displacement of intraocular lens, sequela: Secondary | ICD-10-CM

## 2020-06-12 DIAGNOSIS — E119 Type 2 diabetes mellitus without complications: Secondary | ICD-10-CM

## 2020-06-12 DIAGNOSIS — I10 Essential (primary) hypertension: Secondary | ICD-10-CM

## 2020-06-12 DIAGNOSIS — H35033 Hypertensive retinopathy, bilateral: Secondary | ICD-10-CM

## 2020-06-17 NOTE — Progress Notes (Signed)
[facility] Note  06/26/2020     CHIEF COMPLAINT Patient presents for Retina Follow Up   HISTORY OF PRESENT ILLNESS: Jonathan [name] is a 70 y.o. male who presents to the clinic today for:   HPI    Retina Follow Up    Patient presents with  Other.  In right eye.  This started 7 months ago.  I, the attending physician,  performed the HPI with the patient and updated documentation appropriately.          Comments    Patient here for 7 months retina follow up for dislocated IOL OD. Patient states vision  is alright. No eye pain. Has new glasses.       Last edited by [name], MD on 06/27/2020  2:18 AM. (History)    pt got new glasses from Dr. Maryjane Hurter in January, he doesn't feel like they have helped his right eye that much, pt has to have a biopsy on his kidney on March 24, he states he also has to see a specialist about a mass that was found on his liver  Referring physician: [name], MD [place], [place] [id number]   HISTORICAL INFORMATION:   Selected notes from the [facility] Referred by Dr. [name] for evaluation of dislocated IOL   CURRENT MEDICATIONS: No current outpatient medications on file. (Ophthalmic Drugs)   No current facility-administered medications for this visit. (Ophthalmic Drugs)   Current Outpatient Medications (Other)  Medication Sig  . acetaminophen (TYLENOL) 500 MG tablet Take 1,000 mg by mouth every 6 (six) hours as needed for moderate pain or headache.  Marland Kitchen amLODipine (NORVASC) 10 MG tablet Take 1 tablet (10 mg total) by mouth daily.  Marland Kitchen atorvastatin (LIPITOR) 80 MG tablet Take 1 tablet (80 mg total) by mouth daily.  . Blood Glucose Monitoring Suppl (ONETOUCH VERIO) w/Device KIT Check blood sugar once per day  . clopidogrel (PLAVIX) 75 MG tablet Take 1 tablet (75 mg total) by mouth daily.  . fexofenadine (ALLEGRA ALLERGY) 180 MG tablet Take 1 tablet (180 mg total) by mouth daily.  . fluticasone  (FLONASE) 50 MCG/ACT nasal spray Place 1 spray into both nostrils daily.  Marland Kitchen glucose blood (ONETOUCH VERIO) test strip Check sugar once per day  . Lancet Devices (ONE TOUCH DELICA LANCING DEV) MISC Check sugar once per day  . lisinopril (ZESTRIL) 40 MG tablet Take 1 tablet (40 mg total) by mouth daily.  . metFORMIN (GLUCOPHAGE) 1000 MG tablet Take 1 tablet (1,000 mg total) by mouth 2 (two) times daily with a meal.  . OneTouch Delica Lancets 73A MISC Check sugar once per day   No current facility-administered medications for this visit. (Other)      REVIEW OF SYSTEMS: ROS    Positive for: HENT, Endocrine, Cardiovascular, Eyes, Respiratory   Negative for: Constitutional, Gastrointestinal, Neurological, Skin, Genitourinary, Musculoskeletal, Psychiatric, Allergic/Imm, Heme/Lymph   Last edited by [name], COA on 06/26/2020  9:57 AM. (History)       ALLERGIES No Known Allergies  PAST MEDICAL HISTORY Past Medical History:  Diagnosis Date  . Anxiety   . Arthritis   . COPD (chronic obstructive pulmonary disease) ([place])   . Depression   . Diabetes mellitus without complication ([place])   . Diarrhea   . History of kidney stones   . HTN (hypertension)   . Hyperlipidemia   . Hypertensive retinopathy    OU  . Pneumonia   .  Retinal detachment    Rheg. RD OD  . Sleep apnea    does not use cpap  . Stroke [facility])    denies any deficits   Past Surgical History:  Procedure Laterality Date  . CATARACT EXTRACTION Bilateral    Dr. [name]  . CIRCUMCISION    . EYE SURGERY Bilateral    Cat Sx OU; RD repair OD  . GAS INSERTION Right 03/13/2019   Procedure: INSERTION OF GAS (C3F8) RIGHT EYE;  Surgeon: [name], MD;  Location: [place];  Service: Ophthalmology;  Laterality: Right;  . LASER PHOTO ABLATION Right 03/13/2019   Procedure: LASER PHOTO  ABLATION RIGHT EYE;  Surgeon: [name], MD;  Location: [place];  Service: Ophthalmology;  Laterality: Right;  . none    . PARS PLANA  VITRECTOMY Right 03/13/2019   Procedure: 25 GAUGE PARS PLANA VITRECTOMY WITH  INTRAOCULAR LENSE EXPLANTATION RIGHT EYE.;  Surgeon: [name], MD;  Location: [place];  Service: Ophthalmology;  Laterality: Right;  . PARS PLANA VITRECTOMY Right 06/27/2019   Procedure: PARS PLANA VITRECTOMY WITH 25 GAUGE;  Surgeon: [name], MD;  Location: [place];  Service: Ophthalmology;  Laterality: Right;  . PHOTOCOAGULATION WITH LASER Left 03/13/2019   Procedure: INDIRECT PHOTOCOAGULATION WITH LASER LEFT EYE;  Surgeon: [name], MD;  Location: [place];  Service: Ophthalmology;  Laterality: Left;  . PLACEMENT AND SUTURE OF SECONDARY INTRAOCULAR LENS Right 06/27/2019   Procedure: PLACEMENT AND SUTURE OF SECONDARY INTRAOCULAR LENS;  Surgeon: [name], MD;  Location: [place];  Service: Ophthalmology;  Laterality: Right;  . RETINAL DETACHMENT SURGERY Right 03/13/2019   PPV for repair of rheg. RD - Dr. [name]    FAMILY HISTORY Family History  Problem Relation Age of Onset  . Cancer Mother        brain tumor  . Heart disease Father   . Cirrhosis Father     SOCIAL HISTORY Social History   Tobacco Use  . Smoking status: Current Every Day Smoker    Packs/day: 0.50    Years: 43.00    Pack years: 21.50    Types: Cigarettes  . Smokeless tobacco: Never Used  . Tobacco comment: pt is trying to aggressively cut back on # cigs smoked daily.   Vaping Use  . Vaping Use: Never used  Substance Use Topics  . Alcohol use: No  . Drug use: No         OPHTHALMIC EXAM:  Base Eye Exam    Visual Acuity (Snellen - Linear)      Right Left   Dist cc 20/50 20/25 +2   Dist ph cc 20/40 +1    Correction: Glasses       Tonometry (Tonopen, 9:53 AM)      Right Left   Pressure 17 15       Pupils      Dark Light Shape React APD   Right 5 5 Irregular Minimal None   Left 4 3 Round Brisk None       Visual Fields (Counting fingers)      Left Right    Full Full       Extraocular Movement       Right Left    Full, Ortho Full, Ortho       Neuro/Psych    Oriented x3: Yes   Mood/Affect: Normal       Dilation    Both eyes: 1.0% Mydriacyl, 2.5% Phenylephrine @ 9:52 AM  Slit Lamp and Fundus Exam    External Exam      Right Left   External Normal Normal       Slit Lamp Exam      Right Left   Lids/Lashes Dermatochalasis - upper lid, multiple papillomas, mild Meibomian gland dysfunction Dermatochalasis - upper lid, multiple papillomas, Telangiectasia, mild Meibomian gland dysfunction   Conjunctiva/Sclera White and quiet temporal pinguecula   Cornea Arcus, well healed superior cataract wound, sub epi scar, temporal paracentral, mild endo pigment Arcus   Anterior Chamber Deep, trace pigment Deep and quiet   Iris Round and dilated Round and dilated   Lens Sutured Akreos IOL in excellent position Posterior chamber intraocular lens in good position, trace Posterior capsular opacification   Vitreous post vitrectomy; clear Vitreous syneresis       Fundus Exam      Right Left   Disc mild Pallor, Sharp rim, focal PPP Pink and Sharp, mild temporal PPP   C/D Ratio 0.5 0.4   Macula flat, Blunted foveal reflex, Retinal pigment epithelial mottling, mild Epiretinal membrane, No heme or edema Flat, Blunted foveal reflex, No heme or edema   Vessels attenuated, Tortuous, mild AV crossing changes attenuated, Tortuous, mild AV crossing changes   Periphery attached; good laser changes 360, ORIGINALLY: Bullous superior detachment from 1000-130 with multiple tears at 1030 and 1100 Attached, paving stone degeneration inferiorly, retinal hole at 1200 and tears at 430 and 0730; good laser surrounding breaks at 1200, 0430 and 0730, No new RT/RD        Refraction    Wearing Rx      Sphere Cylinder Axis Add   Right -1.00 +1.00 026 +1.75   Left -0.25 +0.25 141 +1.75          IMAGING AND PROCEDURES  Imaging and Procedures for _0 @  OCT, Retina - OU - Both Eyes       Right  Eye Quality was good. Central Foveal Thickness: 313. Progression has been stable. Findings include no IRF, no SRF, normal foveal contour, macular pucker, retinal drusen  (Mild Nasal macular pucker).   Left Eye Quality was good. Central Foveal Thickness: 302. Progression has been stable. Findings include normal foveal contour, no IRF, no SRF, epiretinal membrane, macular pucker (stable).   Notes *Images captured and stored on drive  Diagnosis / Impression:  OD: retina stably reattached -- NFP, no IRF/SRF, mild nasal macular pucker  -- stable OS: NFP, no IRF/SRF; +ERM w/ pucker--stable  Clinical management:  See below  Abbreviations: NFP - Normal foveal profile. CME - cystoid macular edema. PED - pigment epithelial detachment. IRF - intraretinal fluid. SRF - subretinal fluid. EZ - ellipsoid zone. ERM - epiretinal membrane. ORA - outer retinal atrophy. ORT - outer retinal tubulation. SRHM - subretinal hyper-reflective material                 ASSESSMENT/PLAN:    ICD-10-CM   1. Right retinal detachment  H33.21   2. Retinal edema  H35.81 OCT, Retina - OU - Both Eyes  3. Dislocation of intraocular lens, sequela  T85.22XS   4. Retinal breaks without detachment  H35.89   5. Essential hypertension  I10   6. Hypertensive retinopathy of both eyes  H35.033   7. Diabetes mellitus type 2 without retinopathy ([place])  E11.9   8. Pseudophakia of both eyes  Z96.1     1,2. Rhegmatogenous retinal detachment, right eye  - bullous superior mac off detachment, onset ~1 wk, with onset  of foveal involvement Tuesday, 03/12/19 by pt history  - detached from 1000 to 0130, fovea off, multiple tears at 1030 and 1100  - s/p PPV/IOL explanation/EL/FAX/14% C3F8 OD, 11.18.20             - retina attached and in good position  3. Dislocated IOL OD  - s/p CE/IOL OD on 10.21.20 w/ Dr. [name]  - IOL displaced inferiorly with superior haptic hung up in capsular remnant   - poor dilation --  ?pseudoexfoliation, floppy iris OD  - developed macula/fovea involving RD as above  - s/p IOL explantation during RD repair 11.18.20 as above  - s/p secondary sutured Akreos IOL 03.04.21             - doing well with vision at 20/40 [facility]) with new glasses Rx  - 1200 sutures removed at slit lamp,  04.23.21  - 0900 suture removed at slit lamp, 6.17.21  - 0300 suture removed at slit lamp, 07.16.21             - retina attached and in good position             - IOP 17 today  - cont Cosopt qdaily OD  - f/u 1 year, DFE, OCT  4. Retinal breaks without detachment OS  - scleral depressed exam of OS completed as part of RD work up  - large retinal break at 12, smaller flap tears at 0430 amd 0730  - s/p laser retinopexy OS (11.18.20) -- good laser around breaks at 1200, 0430 and 0730  5,6. Hypertensive retinopathy OU  - discussed importance of tight BP control  - monitor  7. Diabetes mellitus, type 2 without retinopathy  - The incidence, risk factors for progression, natural history and treatment options for diabetic retinopathy  were discussed with patient.    - The need for close monitoring of blood glucose, blood pressure, and serum lipids, avoiding cigarette or any type of tobacco, and the need for long term follow up was also discussed with patient.  - f/u in 1 year, sooner prn  8. Pseudophakia OU  - s/p CE/IOL OU (Dr. [name], October 2020)  - subluxed IOL OD as above - s/p IOL explantation on 11.18.2020, sutured IOL on 3.4.21  - OS doing well  - monitor   Ophthalmic Meds Ordered this visit:  No orders of the defined types were placed in this encounter.      Return in about 1 year (around 06/26/2021).  There are no Patient Instructions on file for this visit.   Explained the diagnoses, plan, and follow up with the patient and they expressed understanding.  Patient expressed understanding of the importance of proper follow up care.   This document serves as a record of services  personally performed by Gardiner Sleeper, MD, PhD. It was created on their behalf by San Jetty. Owens Shark, OA an ophthalmic technician. The creation of this record is the provider's dictation and/or activities during the visit.    Electronically signed by: San Jetty. Owens Shark, [place] 02.23.2022 2:24 AM   Gardiner Sleeper, M.D., Ph.D. Diseases & Surgery of the Retina and Vitreous [facility]  I have reviewed the above documentation for accuracy and completeness, and I agree with the above. Gardiner Sleeper, M.D., Ph.D. 06/27/20 2:24 AM   Abbreviations: M myopia (nearsighted); A astigmatism; H hyperopia (farsighted); P presbyopia; Mrx spectacle prescription;  CTL contact lenses; OD right eye; OS left eye; OU both  eyes  XT exotropia; ET esotropia; PEK punctate epithelial keratitis; PEE punctate epithelial erosions; DES dry eye syndrome; MGD meibomian gland dysfunction; ATs artificial tears; PFAT's preservative free artificial tears; [place] nuclear sclerotic cataract; PSC posterior subcapsular cataract; ERM epi-retinal membrane; PVD posterior vitreous detachment; RD retinal detachment; DM diabetes mellitus; DR diabetic retinopathy; NPDR non-proliferative diabetic retinopathy; PDR proliferative diabetic retinopathy; CSME clinically significant macular edema; DME diabetic macular edema; dbh dot blot hemorrhages; CWS cotton wool spot; POAG primary open angle glaucoma; C/D cup-to-disc ratio; HVF humphrey visual field; GVF goldmann visual field; OCT optical coherence tomography; IOP intraocular pressure; BRVO Branch retinal vein occlusion; CRVO central retinal vein occlusion; CRAO central retinal artery occlusion; BRAO branch retinal artery occlusion; RT retinal tear; SB scleral buckle; PPV pars plana vitrectomy; VH Vitreous hemorrhage; PRP panretinal laser photocoagulation; IVK intravitreal kenalog; VMT vitreomacular traction; MH Macular hole;  NVD neovascularization of the disc; NVE neovascularization  elsewhere; AREDS age related eye disease study; ARMD age related macular degeneration; POAG primary open angle glaucoma; EBMD epithelial/anterior basement membrane dystrophy; ACIOL anterior chamber intraocular lens; IOL intraocular lens; PCIOL posterior chamber intraocular lens; Phaco/IOL phacoemulsification with intraocular lens placement; [place] photorefractive keratectomy; LASIK laser assisted in situ keratomileusis; HTN hypertension; DM diabetes mellitus; COPD chronic obstructive pulmonary disease

## 2020-06-26 ENCOUNTER — Ambulatory Visit (INDEPENDENT_AMBULATORY_CARE_PROVIDER_SITE_OTHER): Payer: Self-pay | Admitting: Ophthalmology

## 2020-06-26 ENCOUNTER — Other Ambulatory Visit: Payer: Self-pay

## 2020-06-26 ENCOUNTER — Encounter (INDEPENDENT_AMBULATORY_CARE_PROVIDER_SITE_OTHER): Payer: Self-pay | Admitting: Ophthalmology

## 2020-06-26 DIAGNOSIS — H3589 Other specified retinal disorders: Secondary | ICD-10-CM

## 2020-06-26 DIAGNOSIS — I10 Essential (primary) hypertension: Secondary | ICD-10-CM

## 2020-06-26 DIAGNOSIS — H3581 Retinal edema: Secondary | ICD-10-CM

## 2020-06-26 DIAGNOSIS — H35033 Hypertensive retinopathy, bilateral: Secondary | ICD-10-CM

## 2020-06-26 DIAGNOSIS — T8522XS Displacement of intraocular lens, sequela: Secondary | ICD-10-CM

## 2020-06-26 DIAGNOSIS — H3321 Serous retinal detachment, right eye: Secondary | ICD-10-CM

## 2020-06-26 DIAGNOSIS — Z961 Presence of intraocular lens: Secondary | ICD-10-CM

## 2020-06-26 DIAGNOSIS — E119 Type 2 diabetes mellitus without complications: Secondary | ICD-10-CM

## 2020-06-26 LAB — HM DIABETES EYE EXAM

## 2020-06-27 ENCOUNTER — Encounter (INDEPENDENT_AMBULATORY_CARE_PROVIDER_SITE_OTHER): Payer: Self-pay | Admitting: Ophthalmology

## 2020-10-02 ENCOUNTER — Inpatient Hospital Stay (HOSPITAL_COMMUNITY)
Admission: EM | Admit: 2020-10-02 | Discharge: 2020-10-05 | DRG: 853 | Disposition: A | Discharge status: Home / Self Care | Payer: Medicare Other | Attending: Family Medicine | Admitting: Family Medicine

## 2020-10-02 ENCOUNTER — Emergency Department (HOSPITAL_COMMUNITY): Payer: Medicare Other

## 2020-10-02 DIAGNOSIS — I6521 Occlusion and stenosis of right carotid artery: Secondary | ICD-10-CM | POA: Diagnosis not present

## 2020-10-02 DIAGNOSIS — Z6833 Body mass index (BMI) 33.0-33.9, adult: Secondary | ICD-10-CM

## 2020-10-02 DIAGNOSIS — R0902 Hypoxemia: Secondary | ICD-10-CM | POA: Diagnosis not present

## 2020-10-02 DIAGNOSIS — Z86718 Personal history of other venous thrombosis and embolism: Secondary | ICD-10-CM

## 2020-10-02 DIAGNOSIS — Z7984 Long term (current) use of oral hypoglycemic drugs: Secondary | ICD-10-CM

## 2020-10-02 DIAGNOSIS — I10 Essential (primary) hypertension: Secondary | ICD-10-CM | POA: Diagnosis not present

## 2020-10-02 DIAGNOSIS — N179 Acute kidney failure, unspecified: Secondary | ICD-10-CM | POA: Diagnosis present

## 2020-10-02 DIAGNOSIS — I6340 Cerebral infarction due to embolism of unspecified cerebral artery: Secondary | ICD-10-CM | POA: Diagnosis not present

## 2020-10-02 DIAGNOSIS — G4733 Obstructive sleep apnea (adult) (pediatric): Secondary | ICD-10-CM | POA: Diagnosis present

## 2020-10-02 DIAGNOSIS — R29701 NIHSS score 1: Secondary | ICD-10-CM | POA: Diagnosis present

## 2020-10-02 DIAGNOSIS — G928 Other toxic encephalopathy: Secondary | ICD-10-CM | POA: Diagnosis not present

## 2020-10-02 DIAGNOSIS — I6389 Other cerebral infarction: Secondary | ICD-10-CM | POA: Diagnosis not present

## 2020-10-02 DIAGNOSIS — R4182 Altered mental status, unspecified: Secondary | ICD-10-CM | POA: Diagnosis not present

## 2020-10-02 DIAGNOSIS — E669 Obesity, unspecified: Secondary | ICD-10-CM | POA: Diagnosis present

## 2020-10-02 DIAGNOSIS — H9190 Unspecified hearing loss, unspecified ear: Secondary | ICD-10-CM | POA: Diagnosis present

## 2020-10-02 DIAGNOSIS — I6782 Cerebral ischemia: Secondary | ICD-10-CM | POA: Diagnosis not present

## 2020-10-02 DIAGNOSIS — Z9842 Cataract extraction status, left eye: Secondary | ICD-10-CM

## 2020-10-02 DIAGNOSIS — E785 Hyperlipidemia, unspecified: Secondary | ICD-10-CM | POA: Diagnosis not present

## 2020-10-02 DIAGNOSIS — I129 Hypertensive chronic kidney disease with stage 1 through stage 4 chronic kidney disease, or unspecified chronic kidney disease: Secondary | ICD-10-CM | POA: Diagnosis not present

## 2020-10-02 DIAGNOSIS — I63412 Cerebral infarction due to embolism of left middle cerebral artery: Secondary | ICD-10-CM | POA: Diagnosis not present

## 2020-10-02 DIAGNOSIS — F329 Major depressive disorder, single episode, unspecified: Secondary | ICD-10-CM | POA: Diagnosis present

## 2020-10-02 DIAGNOSIS — E1165 Type 2 diabetes mellitus with hyperglycemia: Secondary | ICD-10-CM | POA: Diagnosis not present

## 2020-10-02 DIAGNOSIS — N1831 Chronic kidney disease, stage 3a: Secondary | ICD-10-CM | POA: Diagnosis present

## 2020-10-02 DIAGNOSIS — K760 Fatty (change of) liver, not elsewhere classified: Secondary | ICD-10-CM | POA: Diagnosis not present

## 2020-10-02 DIAGNOSIS — F17210 Nicotine dependence, cigarettes, uncomplicated: Secondary | ICD-10-CM | POA: Diagnosis present

## 2020-10-02 DIAGNOSIS — A419 Sepsis, unspecified organism: Secondary | ICD-10-CM | POA: Diagnosis not present

## 2020-10-02 DIAGNOSIS — Z9841 Cataract extraction status, right eye: Secondary | ICD-10-CM

## 2020-10-02 DIAGNOSIS — I63422 Cerebral infarction due to embolism of left anterior cerebral artery: Secondary | ICD-10-CM

## 2020-10-02 DIAGNOSIS — Z8673 Personal history of transient ischemic attack (TIA), and cerebral infarction without residual deficits: Secondary | ICD-10-CM

## 2020-10-02 DIAGNOSIS — Z299 Encounter for prophylactic measures, unspecified: Secondary | ICD-10-CM

## 2020-10-02 DIAGNOSIS — Z20822 Contact with and (suspected) exposure to covid-19: Secondary | ICD-10-CM | POA: Diagnosis present

## 2020-10-02 DIAGNOSIS — E782 Mixed hyperlipidemia: Secondary | ICD-10-CM | POA: Diagnosis not present

## 2020-10-02 DIAGNOSIS — R4701 Aphasia: Secondary | ICD-10-CM | POA: Diagnosis not present

## 2020-10-02 DIAGNOSIS — Z961 Presence of intraocular lens: Secondary | ICD-10-CM | POA: Diagnosis present

## 2020-10-02 DIAGNOSIS — R1084 Generalized abdominal pain: Secondary | ICD-10-CM | POA: Diagnosis not present

## 2020-10-02 DIAGNOSIS — R410 Disorientation, unspecified: Secondary | ICD-10-CM | POA: Diagnosis not present

## 2020-10-02 DIAGNOSIS — E11319 Type 2 diabetes mellitus with unspecified diabetic retinopathy without macular edema: Secondary | ICD-10-CM | POA: Diagnosis not present

## 2020-10-02 DIAGNOSIS — J441 Chronic obstructive pulmonary disease with (acute) exacerbation: Secondary | ICD-10-CM | POA: Diagnosis not present

## 2020-10-02 DIAGNOSIS — F419 Anxiety disorder, unspecified: Secondary | ICD-10-CM | POA: Diagnosis present

## 2020-10-02 DIAGNOSIS — R778 Other specified abnormalities of plasma proteins: Secondary | ICD-10-CM | POA: Diagnosis present

## 2020-10-02 DIAGNOSIS — E1122 Type 2 diabetes mellitus with diabetic chronic kidney disease: Secondary | ICD-10-CM | POA: Diagnosis not present

## 2020-10-02 DIAGNOSIS — I700 Atherosclerosis of aorta: Secondary | ICD-10-CM | POA: Diagnosis not present

## 2020-10-02 DIAGNOSIS — I672 Cerebral atherosclerosis: Secondary | ICD-10-CM | POA: Diagnosis present

## 2020-10-02 DIAGNOSIS — R6520 Severe sepsis without septic shock: Secondary | ICD-10-CM | POA: Diagnosis not present

## 2020-10-02 DIAGNOSIS — Z87442 Personal history of urinary calculi: Secondary | ICD-10-CM

## 2020-10-02 DIAGNOSIS — F17200 Nicotine dependence, unspecified, uncomplicated: Secondary | ICD-10-CM | POA: Diagnosis not present

## 2020-10-02 DIAGNOSIS — Z86711 Personal history of pulmonary embolism: Secondary | ICD-10-CM

## 2020-10-02 DIAGNOSIS — N2889 Other specified disorders of kidney and ureter: Secondary | ICD-10-CM | POA: Diagnosis present

## 2020-10-02 DIAGNOSIS — Z79899 Other long term (current) drug therapy: Secondary | ICD-10-CM

## 2020-10-02 DIAGNOSIS — K3532 Acute appendicitis with perforation and localized peritonitis, without abscess: Secondary | ICD-10-CM | POA: Diagnosis not present

## 2020-10-02 LAB — APTT: aPTT: 24 seconds (ref 24–36)

## 2020-10-02 LAB — DIFFERENTIAL
Abs Immature Granulocytes: 0.06 10*3/uL (ref 0.00–0.07)
Basophils Absolute: 0 10*3/uL (ref 0.0–0.1)
Basophils Relative: 1 %
Eosinophils Absolute: 0.1 10*3/uL (ref 0.0–0.5)
Eosinophils Relative: 1 %
Immature Granulocytes: 1 %
Lymphocytes Relative: 5 %
Lymphs Abs: 0.4 10*3/uL — ABNORMAL LOW (ref 0.7–4.0)
Monocytes Absolute: 0.1 10*3/uL (ref 0.1–1.0)
Monocytes Relative: 1 %
Neutro Abs: 8.1 10*3/uL — ABNORMAL HIGH (ref 1.7–7.7)
Neutrophils Relative %: 91 %

## 2020-10-02 LAB — CBC
HCT: 50.5 % (ref 39.0–52.0)
Hemoglobin: 16.8 g/dL (ref 13.0–17.0)
MCH: 29.5 pg (ref 26.0–34.0)
MCHC: 33.3 g/dL (ref 30.0–36.0)
MCV: 88.8 fL (ref 80.0–100.0)
Platelets: 202 10*3/uL (ref 150–400)
RBC: 5.69 MIL/uL (ref 4.22–5.81)
RDW: 13.3 % (ref 11.5–15.5)
WBC: 8.8 10*3/uL (ref 4.0–10.5)
nRBC: 0 % (ref 0.0–0.2)

## 2020-10-02 LAB — COMPREHENSIVE METABOLIC PANEL
ALT: 34 U/L (ref 0–44)
AST: 26 U/L (ref 15–41)
Albumin: 3.8 g/dL (ref 3.5–5.0)
Alkaline Phosphatase: 99 U/L (ref 38–126)
Anion gap: 11 (ref 5–15)
BUN: 13 mg/dL (ref 8–23)
CO2: 20 mmol/L — ABNORMAL LOW (ref 22–32)
Calcium: 8.9 mg/dL (ref 8.9–10.3)
Chloride: 105 mmol/L (ref 98–111)
Creatinine, Ser: 1.42 mg/dL — ABNORMAL HIGH (ref 0.61–1.24)
GFR, Estimated: 53 mL/min — ABNORMAL LOW (ref >60)
Glucose, Bld: 154 mg/dL — ABNORMAL HIGH (ref 70–99)
Potassium: 3.9 mmol/L (ref 3.5–5.1)
Sodium: 136 mmol/L (ref 135–145)
Total Bilirubin: 1.4 mg/dL — ABNORMAL HIGH (ref 0.3–1.2)
Total Protein: 7.3 g/dL (ref 6.5–8.1)

## 2020-10-02 LAB — I-STAT CHEM 8, ED
BUN: 14 mg/dL (ref 8–23)
Calcium, Ion: 1.13 mmol/L — ABNORMAL LOW (ref 1.15–1.40)
Chloride: 104 mmol/L (ref 98–111)
Creatinine, Ser: 1.2 mg/dL (ref 0.61–1.24)
Glucose, Bld: 155 mg/dL — ABNORMAL HIGH (ref 70–99)
HCT: 53 % — ABNORMAL HIGH (ref 39.0–52.0)
Hemoglobin: 18 g/dL — ABNORMAL HIGH (ref 13.0–17.0)
Potassium: 3.9 mmol/L (ref 3.5–5.1)
Sodium: 139 mmol/L (ref 135–145)
TCO2: 20 mmol/L — ABNORMAL LOW (ref 22–32)

## 2020-10-02 LAB — PROTIME-INR
INR: 1 (ref 0.8–1.2)
Prothrombin Time: 13.1 seconds (ref 11.4–15.2)

## 2020-10-02 LAB — ETHANOL: Alcohol, Ethyl (B): <10 mg/dL (ref <10)

## 2020-10-02 LAB — LACTIC ACID, PLASMA: Lactic Acid, Venous: 2.9 mmol/L (ref 0.5–1.9)

## 2020-10-02 MED ORDER — VANCOMYCIN HCL 2000 MG/400ML IV SOLN
2000.0000 mg | Freq: Once | INTRAVENOUS | Status: AC
  Administered 2020-10-03: 2000 mg via INTRAVENOUS
  Filled 2020-10-02: qty 400

## 2020-10-02 MED ORDER — SODIUM CHLORIDE 0.9% FLUSH
3.0000 mL | Freq: Once | INTRAVENOUS | Status: AC
Start: 2020-10-02 — End: 2020-10-02
  Administered 2020-10-02: 3 mL via INTRAVENOUS

## 2020-10-02 MED ORDER — SODIUM CHLORIDE 0.9 % IV BOLUS
1000.0000 mL | Freq: Once | INTRAVENOUS | Status: AC
  Administered 2020-10-03: 1000 mL via INTRAVENOUS

## 2020-10-02 MED ORDER — ACETAMINOPHEN 500 MG PO TABS
1000.0000 mg | ORAL_TABLET | Freq: Once | ORAL | Status: AC
  Administered 2020-10-02: 1000 mg via ORAL
  Filled 2020-10-02: qty 2

## 2020-10-02 MED ORDER — SODIUM CHLORIDE 0.9 % IV SOLN
2.0000 g | Freq: Once | INTRAVENOUS | Status: AC
  Administered 2020-10-02: 2 g via INTRAVENOUS
  Filled 2020-10-02: qty 2

## 2020-10-02 MED ORDER — SODIUM CHLORIDE 0.9 % IV BOLUS
1000.0000 mL | Freq: Once | INTRAVENOUS | Status: AC
  Administered 2020-10-02: 1000 mL via INTRAVENOUS

## 2020-10-02 NOTE — Consult Note (Signed)
NEUROLOGY CONSULTATION NOTE   Date of service: October 02, 2020 Patient Name: Jonathan [name] MRN:  [id number] DOB:  [birth year] Reason for consult: "Stroke code" Requesting Provider: No att. providers found _ _ _   _ __   _ __ _ _  __ __   _ __   __ _  History of Present Illness  Jonathan [name] is a 70 y.o. male with PMH significant for depression, COPD, HTNive retinopathy, DM2, HLD, retinal detachment in R eye, OSA, renall mass who presents with abdominal pain x 1 day and acute confusion with aphasia that started abruptly at 1600.  Per EMS ,was unable to answer any orientation questions. Vitals with elevated SBP to 449 systolics, normal glucose.  On evaluation in the ED, he was noted to be awake, alert and conversant. Endorses abdominal pain. Denies any headache, no arm or leg weakness, no numbness. No aphasia noted. He was noted to be febrile in the ED with tachycardia.  Patient is encephalopathic and not sure if he can provide reliable history. I attempted to contact the listed phone number for his wife but the number is incorrect.  mRS: 0 tPA/Throbmectomy: No, low suspicion for stroke NIHSS:1(thinks this is august)   ROS   Constitutional Denies weight loss, fever and chills.  HEENT Denies changes in vision and hearing.  Respiratory Denies SOB and cough.  CV Denies palpitations and CP  GI Endorses abdominal pain,but no nausea, vomiting and diarrhea.  GU Denies dysuria and urinary frequency.   MSK Denies myalgia and joint pain.   Skin Denies rash and pruritus.   Neurological Denies headache and syncope.   Psychiatric Denies recent changes in mood. Denies anxiety and depression.    Past History   Past Medical History:  Diagnosis Date  . Anxiety   . Arthritis   . COPD (chronic obstructive pulmonary disease) ([place])   . Depression   . Diabetes mellitus without complication ([place])   . Diarrhea   . History of kidney stones   . HTN (hypertension)   . Hyperlipidemia   .  Hypertensive retinopathy    OU  . Pneumonia   . Retinal detachment    Rheg. RD OD  . Sleep apnea    does not use cpap  . Stroke [facility])    denies any deficits   Past Surgical History:  Procedure Laterality Date  . CATARACT EXTRACTION Bilateral    Dr. [name]  . CIRCUMCISION    . EYE SURGERY Bilateral    Cat Sx OU; RD repair OD  . GAS INSERTION Right 03/13/2019   Procedure: INSERTION OF GAS (C3F8) RIGHT EYE;  Surgeon: [name], MD;  Location: [place];  Service: Ophthalmology;  Laterality: Right;  . LASER PHOTO ABLATION Right 03/13/2019   Procedure: LASER PHOTO  ABLATION RIGHT EYE;  Surgeon: [name], MD;  Location: [place];  Service: Ophthalmology;  Laterality: Right;  . none    . PARS PLANA VITRECTOMY Right 03/13/2019   Procedure: 25 GAUGE PARS PLANA VITRECTOMY WITH  INTRAOCULAR LENSE EXPLANTATION RIGHT EYE.;  Surgeon: [name], MD;  Location: [place];  Service: Ophthalmology;  Laterality: Right;  . PARS PLANA VITRECTOMY Right 06/27/2019   Procedure: PARS PLANA VITRECTOMY WITH 25 GAUGE;  Surgeon: [name], MD;  Location: [place];  Service: Ophthalmology;  Laterality: Right;  . PHOTOCOAGULATION WITH LASER Left 03/13/2019   Procedure: INDIRECT PHOTOCOAGULATION WITH LASER LEFT EYE;  Surgeon: [name], MD;  Location: [place];  Service: Ophthalmology;  Laterality: Left;  . PLACEMENT AND SUTURE OF SECONDARY INTRAOCULAR LENS Right 06/27/2019   Procedure: PLACEMENT AND SUTURE OF SECONDARY INTRAOCULAR LENS;  Surgeon: [name], MD;  Location: [place];  Service: Ophthalmology;  Laterality: Right;  . RETINAL DETACHMENT SURGERY Right 03/13/2019   PPV for repair of rheg. RD - Dr. [name]   Family History  Problem Relation Age of Onset  . Cancer Mother        brain tumor  . Heart disease Father   . Cirrhosis Father    Social History   Socioeconomic History  . Marital status: Married    Spouse name: Not on file  . Number of children: 7  . Years of education: Not on file   . Highest education level: Not on file  Occupational History  . Occupation: welder  Tobacco Use  . Smoking status: Every Day    Packs/day: 0.50    Years: 43.00    Pack years: 21.50    Types: Cigarettes  . Smokeless tobacco: Never  . Tobacco comments:    pt is trying to aggressively cut back on # cigs smoked daily.   Vaping Use  . Vaping Use: Never used  Substance and Sexual Activity  . Alcohol use: No  . Drug use: No  . Sexual activity: Not on file  Other Topics Concern  . Not on file  Social History Narrative   Unemployed. Currently in process of applying for disability.    Social Determinants of Health   Financial Resource Strain: Not on file  Food Insecurity: Not on file  Transportation Needs: Not on file  Physical Activity: Not on file  Stress: Not on file  Social Connections: Not on file   No Known Allergies  Medications  (Not in a hospital admission)    Vitals   There were no vitals filed for this visit.   There is no height or weight on file to calculate BMI.  Physical Exam   General: Laying comfortably in bed; in no acute distress.  HENT: Normal oropharynx and mucosa. Normal external appearance of ears and nose.  Neck: Supple, no pain or tenderness  CV: No JVD. No peripheral edema.  Pulmonary: Symmetric Chest rise. Normal respiratory effort.  Abdomen: Soft to touch, non-tender.  Ext: No cyanosis, edema, or deformity  Skin: No rash. Normal palpation of skin.   Musculoskeletal: Normal digits and nails by inspection. No clubbing.   Neurologic Examination  Mental status/Cognition: Alert, oriented to self, place, but not to month and year, poor attention.  Speech/language: Fluent, comprehension intact, object naming intact, repetition intact. Cranial nerves:   CN II Pupils equal and reactive to light, mild R vision deficit but has known retinal detachment   CN III,IV,VI EOM intact, no gaze preference or deviation, no nystagmus   CN V normal sensation  in V1, V2, and V3 segments bilaterally   CN VII no asymmetry, no nasolabial fold flattening    CN VIII normal hearing to speech    CN IX & X normal palatal elevation, no uvular deviation    CN XI 5/5 head turn and 5/5 shoulder shrug bilaterally    CN XII midline tongue protrusion    Motor:  Muscle bulk: normal, tone normal, pronator drift none. Mvmt Root Nerve  Muscle Right Left Comments  SA C5/6 Ax Deltoid 5 5   EF C5/6 Mc Biceps 5 5   EE C6/7/8 Rad Triceps 5 5   WF C6/7 Med FCR  WE C7/8 PIN ECU     F Ab C8/T1 U ADM/FDI 5 5   HF L1/2/3 Fem Illopsoas 5 5   KE L2/3/4 Fem Quad 5 5   DF L4/5 D Peron Tib Ant 5 5   PF S1/2 Tibial Grc/Sol 5 5    Reflexes:  Right Left Comments  Pectoralis      Biceps (C5/6) 2 2   Brachioradialis (C5/6) 2 2    Triceps (C6/7) 2 2    Patellar (L3/4) 2 2    Achilles (S1)      Hoffman      Plantar     Jaw jerk    Sensation:  Light touch Intact throughout   Pin prick    Temperature    Vibration   Proprioception    Coordination/Complex Motor:  - Finger to Nose intact BL - Heel to shin intact - Rapid alternating movement are slowed. - Gait: Deferred.  Labs   CBC: No results for input(s): WBC, NEUTROABS, HGB, HCT, MCV, PLT in the last 168 hours.  Basic Metabolic Panel:  Lab Results  Component Value Date   NA 139 05/19/2020   K 4.3 05/19/2020   CO2 19 (L) 05/19/2020   GLUCOSE 121 (H) 05/19/2020   BUN 16 05/19/2020   CREATININE 1.34 (H) 05/19/2020   CALCIUM 9.1 05/19/2020   GFRNONAA 54 (L) 05/19/2020   GFRAA 62 05/19/2020   Lipid Panel:  Lab Results  Component Value Date   LDLCALC 134 (H) 06/11/2019   HgbA1c:  Lab Results  Component Value Date   HGBA1C 7.5 (A) 03/26/2020   Urine Drug Screen: No results found for: LABOPIA, COCAINSCRNUR, LABBENZ, AMPHETMU, THCU, LABBARB  Alcohol Level No results found for: [place]  CT Head without contrast: Personally reviewed and CTH was negative for a large hypodensity concerning for a large  territory infarct or hyperdensity concerning for an ICH  Impression   Jonathan [name] is a 70 y.o. male with PMH significant for depression, COPD, HTNive retinopathy, DM2, HLD, retinal detachment in R eye, OSA, renall mass who presents with abdominal pain x 1 day and acute confusion with aphasia that started abruptly at 1600. He has not focal deficit on exam, his presentation seems more consistent with encephalopathy. He does not seem to have aphasia out of proportion to encephalopathy. I suspect that his presentation is likely due to toxic metabolic encephalopathy probably from an underlying infection given fever and tachycardia.  My suspicion for a stroke is low, he is at risk however given smoking and several stroke risk factors along with hypercoagulability in the setting of malignancy. Given no focal deficit, he is not a candidate for tPA or thrombectomy so there is no urgency to MRI.  He has not obvious signs of meningitis on exam.  Impression: Toxic metabolic encephalopathy lilkely in the setting of an infection.  Recommendations  - MRI Brain without contrast - Infectious and sepsis workup per ED team. - No obvious signs of meningitis on exam.  ______________________________________________________________________   Thank you for the opportunity to take part in the care of this patient. If you have any further questions, please contact the neurology consultation attending.  Signed,  [place] Pager Number [phone] _ _ _   _ __   _ __ _ _  __ __   _ __   __ _

## 2020-10-02 NOTE — ED Provider Notes (Signed)
[facility] EMERGENCY DEPARTMENT Provider Note   CSN: [id number] Arrival date & time: 10/02/20  2215  An emergency department physician performed an initial assessment on this suspected stroke patient at 2216.  History Chief Complaint  Patient presents with   Aphasia    Jonathan [name] is a 70 y.o. male BIB EMS for evaluation of AMS. EMS reports that family noted that patient was confused, slurring his words. They felt like this started around 4 PM.  Patient was initially brought in as a code stroke.  Patient reports that he did not feel good today but cannot tell me further.  He states that he just sometimes feels weak. He states that sometimes his stomach hurts but it does not hurt today. Does not tell me much else.  EM LEVEL 5 CAVEAT DUE TO AMS  The history is provided by the EMS personnel.      Past Medical History:  Diagnosis Date   Anxiety    Arthritis    COPD (chronic obstructive pulmonary disease) (HCC)    Depression    Diabetes mellitus without complication (HCC)    Diarrhea    History of kidney stones    HTN (hypertension)    Hyperlipidemia    Hypertensive retinopathy    OU   Pneumonia    Retinal detachment    Rheg. RD OD   Sleep apnea    does not use cpap   Stroke [facility])    denies any deficits    Patient Active Problem List   Diagnosis Date Noted   Kidney mass 05/19/2020   Aortic atherosclerosis (HCC) 03/28/2020   Abdominal fullness 03/28/2020   Ingrown toenail 01/09/2020   Need for immunization against influenza 03/23/2019   Complex sleep apnea syndrome 01/23/2019   Treatment-emergent central sleep apnea 01/23/2019   Severe obstructive sleep apnea-hypopnea syndrome 01/23/2019   OSA and COPD overlap syndrome (HCC) 01/23/2019   Cerebrovascular accident (CVA) due to thrombosis of cerebral artery (HCC) 01/23/2019   Excessive daytime sleepiness 01/23/2019   History of stroke 04/04/2016   Type 2 diabetes mellitus (HCC) 04/01/2016    Epigastric hernia 12/01/2015   Allergic rhinitis 10/31/2012   HLD (hyperlipidemia) 09/07/2011   OSA (obstructive sleep apnea) 06/17/2011   Insomnia 05/30/2011   Hearing loss 01/17/2011   Knee pain, bilateral 11/18/2010   H/O: asbestos exposure 11/18/2010   Inguinal hernia, right 11/07/2010   Essential hypertension 08/17/2010   Bradycardia 08/17/2010   Tobacco use disorder 08/17/2010    Past Surgical History:  Procedure Laterality Date   CATARACT EXTRACTION Bilateral    Dr. Jana Hakim     EYE SURGERY Bilateral    Cat Sx OU; RD repair OD   GAS INSERTION Right 03/13/2019   Procedure: INSERTION OF GAS (C3F8) RIGHT EYE;  Surgeon: Rennis Chris, MD;  Location: [facility] OR;  Service: Ophthalmology;  Laterality: Right;   LASER PHOTO ABLATION Right 03/13/2019   Procedure: LASER PHOTO  ABLATION RIGHT EYE;  Surgeon: Rennis Chris, MD;  Location: [facility] OR;  Service: Ophthalmology;  Laterality: Right;   none     PARS PLANA VITRECTOMY Right 03/13/2019   Procedure: 25 GAUGE PARS PLANA VITRECTOMY WITH  INTRAOCULAR LENSE EXPLANTATION RIGHT EYE.;  Surgeon: Rennis Chris, MD;  Location: [facility] OR;  Service: Ophthalmology;  Laterality: Right;   PARS PLANA VITRECTOMY Right 06/27/2019   Procedure: PARS PLANA VITRECTOMY WITH 25 GAUGE;  Surgeon: Rennis Chris, MD;  Location: [facility] OR;  Service: Ophthalmology;  Laterality: Right;  PHOTOCOAGULATION WITH LASER Left 03/13/2019   Procedure: INDIRECT PHOTOCOAGULATION WITH LASER LEFT EYE;  Surgeon: [name], MD;  Location: [place];  Service: Ophthalmology;  Laterality: Left;   PLACEMENT AND SUTURE OF SECONDARY INTRAOCULAR LENS Right 06/27/2019   Procedure: PLACEMENT AND SUTURE OF SECONDARY INTRAOCULAR LENS;  Surgeon: [name], MD;  Location: [place];  Service: Ophthalmology;  Laterality: Right;   RETINAL DETACHMENT SURGERY Right 03/13/2019   PPV for repair of rheg. RD - Dr. [name]       Family History  Problem Relation Age of Onset   Cancer Mother         brain tumor   Heart disease Father    Cirrhosis Father     Social History   Tobacco Use   Smoking status: Every Day    Packs/day: 0.50    Years: 43.00    Pack years: 21.50    Types: Cigarettes   Smokeless tobacco: Never   Tobacco comments:    pt is trying to aggressively cut back on # cigs smoked daily.   Vaping Use   Vaping Use: Never used  Substance Use Topics   Alcohol use: No   Drug use: No    Home Medications Prior to Admission medications   Medication Sig Start Date End Date Taking? Authorizing Provider  acetaminophen (TYLENOL) 500 MG tablet Take 1,000 mg by mouth every 6 (six) hours as needed for moderate pain or headache.    [provider]  amLODipine (NORVASC) 10 MG tablet Take 1 tablet (10 mg total) by mouth daily. 01/09/20   Leeanne Rio, MD  atorvastatin (LIPITOR) 80 MG tablet Take 1 tablet (80 mg total) by mouth daily. 01/09/20   Leeanne Rio, MD  Blood Glucose Monitoring Suppl [facility] VERIO) w/Device KIT Check blood sugar once per day 12/03/18   Leeanne Rio, MD  clopidogrel (PLAVIX) 75 MG tablet Take 1 tablet (75 mg total) by mouth daily. 01/09/20   Leeanne Rio, MD  fexofenadine [facility] ALLERGY) 180 MG tablet Take 1 tablet (180 mg total) by mouth daily. 01/14/20   Martyn Malay, MD  fluticasone (FLONASE) 50 MCG/ACT nasal spray Place 1 spray into both nostrils daily. 01/14/20   Martyn Malay, MD  glucose blood [facility] VERIO) test strip Check sugar once per day 12/03/18   Leeanne Rio, MD  Lancet Devices (ONE TOUCH DELICA LANCING DEV) MISC Check sugar once per day 12/03/18   Leeanne Rio, MD  lisinopril (ZESTRIL) 40 MG tablet Take 1 tablet (40 mg total) by mouth daily. 03/26/20   Leeanne Rio, MD  metFORMIN (GLUCOPHAGE) 1000 MG tablet Take 1 tablet (1,000 mg total) by mouth 2 (two) times daily with a meal. 03/26/20   Leeanne Rio, MD  OneTouch Delica Lancets 34K MISC Check sugar once per  day 12/03/18   Leeanne Rio, MD    Allergies    Patient has no known allergies.  Review of Systems   Review of Systems  Unable to perform ROS: Mental status change   Physical Exam Updated Vital Signs BP (!) 150/61   Pulse 92   Temp (!) 101.3 F (38.5 C) (Oral)   Resp (!) 22   Ht $R'5\' 6"'oC$  (1.676 m)   Wt 94.3 kg   SpO2 95%   BMI 33.57 kg/m   Physical Exam Vitals and nursing note reviewed.  Constitutional:      Appearance: Normal appearance. He is well-developed.  HENT:  Head: Normocephalic and atraumatic.  Eyes:     General: Lids are normal.     Conjunctiva/sclera: Conjunctivae normal.     Pupils: Pupils are equal, round, and reactive to light.  Cardiovascular:     Rate and Rhythm: Regular rhythm. Tachycardia present.     Pulses: Normal pulses.          Radial pulses are 2+ on the right side and 2+ on the left side.       Dorsalis pedis pulses are 2+ on the right side and 2+ on the left side.     Heart sounds: Normal heart sounds. No murmur heard.   No friction rub. No gallop.  Pulmonary:     Effort: Pulmonary effort is normal.     Breath sounds: Normal breath sounds.     Comments: Intermittently coughing. Lungs clear to auscultation bilaterally.  Symmetric chest rise.  No wheezing, rales, rhonchi. Abdominal:     Palpations: Abdomen is soft. Abdomen is not rigid.     Tenderness: There is no abdominal tenderness. There is no guarding.     Comments: Abdomen is soft, non-distended, non-tender. No rigidity, No guarding. No peritoneal signs.  Musculoskeletal:        General: Normal range of motion.     Cervical back: Full passive range of motion without pain.  Skin:    General: Skin is warm and dry.     Capillary Refill: Capillary refill takes less than 2 seconds.  Neurological:     Mental Status: He is alert.     Comments: Can answer some questions.  Cranial nerves III-XII intact Follows commands, Moves all extremities  5/5 strength to BUE and BLE   Sensation intact throughout all major nerve distributions No slurred speech. No facial droop.   Psychiatric:        Speech: Speech normal.    ED Results / Procedures / Treatments   Labs (all labs ordered are listed, but only abnormal results are displayed) Labs Reviewed  DIFFERENTIAL - Abnormal; Notable for the following components:      Result Value   Neutro Abs 8.1 (*)    Lymphs Abs 0.4 (*)    All other components within normal limits  COMPREHENSIVE METABOLIC PANEL - Abnormal; Notable for the following components:   CO2 20 (*)    Glucose, Bld 154 (*)    Creatinine, Ser 1.42 (*)    Total Bilirubin 1.4 (*)    GFR, Estimated 53 (*)    All other components within normal limits  LACTIC ACID, PLASMA - Abnormal; Notable for the following components:   Lactic Acid, Venous 2.9 (*)    All other components within normal limits  I-STAT CHEM 8, ED - Abnormal; Notable for the following components:   Glucose, Bld 155 (*)    Calcium, Ion 1.13 (*)    TCO2 20 (*)    Hemoglobin 18.0 (*)    HCT 53.0 (*)    All other components within normal limits  URINE CULTURE  CULTURE, BLOOD (ROUTINE X 2)  CULTURE, BLOOD (ROUTINE X 2)  RESP PANEL BY RT-PCR (FLU A&B, COVID) ARPGX2  PROTIME-INR  APTT  CBC  ETHANOL  LACTIC ACID, PLASMA  URINALYSIS, ROUTINE W REFLEX MICROSCOPIC  RAPID URINE DRUG SCREEN, HOSP PERFORMED  AMMONIA  TSH  CBG MONITORING, ED    EKG None  Radiology DG Chest Portable 1 View  Result Date: 10/02/2020 CLINICAL DATA:  Altered mental status EXAM: PORTABLE CHEST 1 VIEW COMPARISON:  11/18/2010  FINDINGS: Heart and mediastinal contours are within normal limits. No focal opacities or effusions. No acute bony abnormality. IMPRESSION: No active disease. Electronically Signed   By: Rolm Baptise M.D.   On: 10/02/2020 23:01   CT HEAD CODE STROKE WO CONTRAST  Result Date: 10/02/2020 CLINICAL DATA:  Code stroke. Initial evaluation for acute code stroke. EXAM: CT HEAD WITHOUT CONTRAST  TECHNIQUE: Contiguous axial images were obtained from the base of the skull through the vertex without intravenous contrast. COMPARISON:  Prior MRI from 06/03/2017 FINDINGS: Brain: Generalized age-related cerebral atrophy with mild chronic small vessel ischemic disease. Small remote lacunar infarct at the right thalamus. No acute intracranial hemorrhage. No acute large vessel territory infarct. No mass lesion, midline shift or mass effect. No hydrocephalus or extra-axial fluid collection. Vascular: No convincing hyperdense vessel. Both MCAs are somewhat dense in appearance, although fairly symmetric in appearance. Scattered vascular calcifications noted within the carotid siphons. Skull: Scalp soft tissues and calvarium within normal limits. Sinuses/Orbits: Globes and orbital soft tissues demonstrate no acute finding. Mild mucosal thickening noted within the sphenoid ethmoidal and maxillary sinuses. Prominent right mastoid and middle ear effusion. Additional trace left mastoid effusion noted as well. Other: None. ASPECTS [facility] Stroke Program Early CT Score) - Ganglionic level infarction (caudate, lentiform nuclei, internal capsule, insula, M1-M3 cortex): 7 - Supraganglionic infarction (M4-M6 cortex): 3 Total score (0-10 with 10 being normal): 10 IMPRESSION: 1. No acute intracranial infarct or other abnormality. 2. ASPECTS is 10. 3. Age-related cerebral atrophy with mild chronic small vessel ischemic disease, with small remote right thalamic lacunar infarct. 4. Right mastoid and middle ear effusion. Correlation with physical exam recommended. These results were communicated to Dr. Lorrin Goodell At 10:39 pmon 6/10/2022by text page via the [facility] messaging system. Electronically Signed   By: Jeannine Boga M.D.   On: 10/02/2020 22:47    Procedures .Critical Care  Date/Time: 10/03/2020 12:04 AM Performed by: Volanda Napoleon, PA-C Authorized by: Volanda Napoleon, PA-C   Critical care provider statement:     Critical care time (minutes):  35   Critical care was necessary to treat or prevent imminent or life-threatening deterioration of the following conditions:  Sepsis   I assumed direction of critical care for this patient from another provider in my specialty: yes     Care discussed with: admitting provider     Medications Ordered in ED Medications  vancomycin (VANCOREADY) IVPB 2000 mg/400 mL (has no administration in time range)  ceFEPIme (MAXIPIME) 2 g in sodium chloride 0.9 % 100 mL IVPB (2 g Intravenous New Bag/Given 10/02/20 2354)  sodium chloride 0.9 % bolus 1,000 mL (has no administration in time range)  sodium chloride flush (NS) 0.9 % injection 3 mL (3 mLs Intravenous Given 10/02/20 2245)  acetaminophen (TYLENOL) tablet 1,000 mg (1,000 mg Oral Given 10/02/20 2312)  sodium chloride 0.9 % bolus 1,000 mL (1,000 mLs Intravenous New Bag/Given 10/02/20 2348)    ED Course  I have reviewed the triage vital signs and the nursing notes.  Pertinent labs & imaging results that were available during my care of the patient were reviewed by me and considered in my medical decision making (see chart for details).    MDM Rules/Calculators/A&P                         70 year old male brought in by EMS for altered mental status, confusion.  The reported that family mentioned that since about 4 PM, patient has  been confused.  Patient was initially brought in as a code stroke.  On initial arrival, he was found to be febrile at 101.3, tachycardic, tachypneic.  Code stroke was canceled.  Neurology would still like MRI but feels like this is mostly encephalopathy from infectious process.  Code sepsis was initiated.  Patient started on broad-spectrum antibiotics.  Unclear etiology of his symptoms at this time.  At this time, his blood pressure is stable.  We will hold off on full 30 cc/kg of fluid until we assess his chest x-ray.  Review of records shows that he did have a renal mass noted on his MRI that  could be concerning for neoplasm.   I discussed with Dr. Cline Cools (Neuro). He believes that this is most likely encephalopathy from infectious source but does want MRI done.  Both neurology and I attempted to contact patient's family but were unsuccessful. The number listed for his wife was the wrong number.  NP shows bicarb of 20, BUN of 13, creatinine of 1.42.  CBC shows no leukocytosis or anemia.  Lactic is 2.9.  Ethanol levels unremarkable.  CXR negative. CT head negative.   Patient given an additional liter of fluid for a total of 2L.  At this time, his lactic is less than 4, his blood pressure stable.  Unclear etiology of his sepsis at this time but feeling this is most likely causing his encephalopathy.  We will plan for admission.  Discussed with family medicine.  They will accept patient for admission.  Discussed with RN regarding obtaining blood cultures.  Cefepime has been started.  Instructed RN to complete blood cultures.  Discussed with patient.  He currently denies any abdominal pain.  Repeat abdominal exam is nontender.  Updated him on plan for admission.  He is agreeable.  Portions of this note were generated with [employer]. Dictation errors may occur despite best attempts at proofreading.  Final Clinical Impression(s) / ED Diagnoses Final diagnoses:  Sepsis, due to unspecified organism, unspecified whether acute organ dysfunction present [facility])    Rx / DC Orders ED Discharge Orders     None        Desma Mcgregor 10/03/20 0005    Blanchie Dessert, MD 10/04/20 1610

## 2020-10-02 NOTE — Sepsis Progress Note (Signed)
Monitoring for code sepsis protocol. 

## 2020-10-02 NOTE — ED Notes (Signed)
Wife- Khyrin Trevathan - [phone] would like a call if/when patient goes upstairs.

## 2020-10-02 NOTE — Code Documentation (Signed)
Responded to Code Stroke called at 2152 for aphasia and AMS, [id number]. Pt arrived at 2215, NIH-1 for LOC questions, CT head-negative for acute changes. Plan for MRI.

## 2020-10-02 NOTE — H&P (Addendum)
[facility] Admission History and Physical Service Pager: [phone]  Patient name: Jonathan [name] Medical record number: [id number] Date of birth: Feb 06, 1951 Age: 70 y.o. Gender: male  Primary Care Provider: Leeanne Rio, MD Consultants: Neurology Code Status: Full code Preferred Emergency Contact: Truett Mainland (Wife)   Chief Complaint: Altered mental status and aphasia  Assessment and Plan: Jonathan [name] is a 70 y.o. male presenting with altered mental status, aphasia and code sepsis initiated . PMH is significant for COPD, HTN, hypertensive retinopathy, MDD, T2DM, HLD, right eye retinal detachment, renal mass, tobacco use, hx of stroke 2/2 cerebral A thrombosis, OSA  Code Sepsis Patient reported altered at home and aphasia, brought in via EMS as code stroke.  Neurology consulted by ED provider, CT head does not show any acute intracranial infarct or any other abnormality.  Does not have any focal neurodeficit on exam and presentation seem more consistent with encephalopathy and neuro suggested most likely due to toxic metabolic encephalopathy probably from an underlying infection but would obtain MRI.  MRI pending.  No obvious sign's of meningitis on exam.  On presentation to ED patient was febrile at 101.3, tachycardic and tachypneic and code stroke was canceled but code sepsis initiated.  WBC 8.8, Lactic acid 2.9, blood culture and urine culture collected and patient started on broad-spectrum antibiotics.  On physical exam patient is alert, awake, oriented to time place and person and able to clarify that he was having abdominal pain x 1 day along with nausea and one episode of vomiting.  Also, notes difficulty urinating x 1 day. C/o recent shortness of breath, increase in cough, increase in sputum production but no hemoptysis.  Suspecting patient's presentation due to acute abdomen (diverticulitis, acute cholecystitis but neg Murphy's sign, UTI) vs new  onset urinary tract infection vs COPD exacerbation. + Diffuse abdominal tenderness and + expiratory wheezing.  Patient meets criteria for COPD exacerbation due to recent shortness of breath, increased in Frequency, increase in sputum production.  Already started on antibiotics in ED, adding steroid treatment along with inhaler treatment for patient.  For his acute abdomen planning to obtain CT abdomen.  Not thinking pyelonephritis, no flank pain reproduced, not thinking gastritis-denies epigastric pain. Well's score is 1.5, denies pain with breathing, Homans negative, no history of DVT or PE, low suspicion for PE at this time. --Admitting to [facility], attending Dr. Gwendlyn Deutscher --s/p 2 doses bolus IVF --f/u Blood culture --f/u Urine culture --continuous pulse oximetry --s/p 1 dose of Cefepime --1 dose of Vancomycin --albuterol 2.5 mg neb q4 PRN --Duoneb 3 ml q6h x 6 doses --Solumedrol 40 mg IV x 1 dose, transition prednisone PO 40 mg x 4 days after CT scan --Doxycycline 100 mg BID x 5 days --f/u urinalysis --f/u ammonia --f/u lactic acid --f/u TSH --f/u MRI --f/u CT abdomen w/contrast --Surgery consult for acute appendectomy with perforation --Switch to Zosyn IV --Continue Vancomycin --NPO  HTN BP 150/61.Home meds: Amlodipine 10 mg Qday, Lisinopril 40 mg --Hold home meds a patient's repeat BP is 122/57  T2DM Blood glucose 155.Home meds: Metformin 500 mg BID. Last A1c 7.0 on 01/09/2020 --CBG monitoring --sSSI scale --f/u HbA1c  HLD Home meds: Atorvastatin 80 mg. LDL 134 on 06/11/2019. Atorvastatin increased recently during outpatient office visit from 40 mg to 80 mg. --c/w Atorvastatin 80 mg  Hx of Stroke Home meds: Plavix 75 mg --c/w Plavix  Hx of OSA Patient does not use CPAP. Last sleep study in 2013 showed mild obstructive  sleep apnea/hypopnea.  --Recommend Outpatient sleep study  Seasonal Allergies Home meds: Allegra Stable --Monitor  Tobacco use disorder Admit smokes 1/2  PPD.  Denies craving and refuses nicotine patch/gums --May Nicotine patch, if cravings tomorrow --Recommend low dose CT scan as an outpatient  Renal Mass MR Liver on 05/07/20 showed solid enhancing endophytic lesion in right renal pelvis~2.8cm.  Patient denies following with urology as of now. --Outpatient urology consult  FEN/GI: NPO Prophylaxis: Lovenox 40 mg  Disposition: Progressive  History of Present Illness:  Jonathan [name] is a 70 y.o. male presenting with altered mental status, aphasia, abdominal pain x 1 day, nausea, 1 episode of vomiting. Patient was in his usual state of health until Thursday evening when he started having abdominal pain.  He had nausea and one episode of vomiting on Friday afternoon but was able to eat his whole meal for lunch.  He has not taken any of his medication in the last 4 days.  He was sitting and suddenly started feeling altered and aphasia to wife and she called EMS but patient states that he was able to understand what was going on and he came voluntarily and understood his reason for hospitalization.  Also notes new onset of difficulty urinating x 1 day.  Patient has recent onset of shortness of breath, increase in cough frequency and increasing sputum production but denies any hemoptysis.  Denies diarrhea, constipation, blood in stool.  Denies fever, chest pain, throat pain or any other complaints.  DVT/PE in the past.  Review Of Systems: Per HPI   Review of Systems  Constitutional:  Positive for activity change, appetite change, chills and fatigue.  HENT:  Positive for congestion and sinus pressure.   Eyes: Negative.   Respiratory:  Positive for cough, shortness of breath and wheezing.   Cardiovascular: Negative.   Gastrointestinal:  Positive for abdominal pain, nausea and vomiting.  Endocrine: Negative.   Genitourinary:  Positive for difficulty urinating.  Musculoskeletal: Negative.   Skin: Negative.   Neurological: Negative.    Psychiatric/Behavioral: Negative.      Patient Active Problem List   Diagnosis Date Noted   Kidney mass 05/19/2020   Aortic atherosclerosis ([place]) 03/28/2020   Abdominal fullness 03/28/2020   Ingrown toenail 01/09/2020   Need for immunization against influenza 03/23/2019   Complex sleep apnea syndrome 01/23/2019   Treatment-emergent central sleep apnea 01/23/2019   Severe obstructive sleep apnea-hypopnea syndrome 01/23/2019   OSA and COPD overlap syndrome ([place]) 01/23/2019   Cerebrovascular accident (CVA) due to thrombosis of cerebral artery ([place]) 01/23/2019   Excessive daytime sleepiness 01/23/2019   History of stroke 04/04/2016   Type 2 diabetes mellitus ([place]) 04/01/2016   Epigastric hernia 12/01/2015   Allergic rhinitis 10/31/2012   HLD (hyperlipidemia) 09/07/2011   OSA (obstructive sleep apnea) 06/17/2011   Insomnia 05/30/2011   Hearing loss 01/17/2011   Knee pain, bilateral 11/18/2010   H/O: asbestos exposure 11/18/2010   Inguinal hernia, right 11/07/2010   Essential hypertension 08/17/2010   Bradycardia 08/17/2010   Tobacco use disorder 08/17/2010    Past Medical History: Past Medical History:  Diagnosis Date   Anxiety    Arthritis    COPD (chronic obstructive pulmonary disease) ([place])    Depression    Diabetes mellitus without complication ([place])    Diarrhea    History of kidney stones    HTN (hypertension)    Hyperlipidemia    Hypertensive retinopathy    OU   Pneumonia    Retinal detachment  Rheg. RD OD   Sleep apnea    does not use cpap   Stroke [facility])    denies any deficits    Past Surgical History: Past Surgical History:  Procedure Laterality Date   CATARACT EXTRACTION Bilateral    Dr. Sherrilee Gilles     EYE SURGERY Bilateral    Cat Sx OU; RD repair OD   GAS INSERTION Right 03/13/2019   Procedure: INSERTION OF GAS (C3F8) RIGHT EYE;  Surgeon: [name], MD;  Location: [place];  Service: Ophthalmology;  Laterality: Right;   LASER PHOTO  ABLATION Right 03/13/2019   Procedure: LASER PHOTO  ABLATION RIGHT EYE;  Surgeon: [name], MD;  Location: [place];  Service: Ophthalmology;  Laterality: Right;   none     PARS PLANA VITRECTOMY Right 03/13/2019   Procedure: 25 GAUGE PARS PLANA VITRECTOMY WITH  INTRAOCULAR LENSE EXPLANTATION RIGHT EYE.;  Surgeon: [name], MD;  Location: [place];  Service: Ophthalmology;  Laterality: Right;   PARS PLANA VITRECTOMY Right 06/27/2019   Procedure: PARS PLANA VITRECTOMY WITH 25 GAUGE;  Surgeon: [name], MD;  Location: [place];  Service: Ophthalmology;  Laterality: Right;   PHOTOCOAGULATION WITH LASER Left 03/13/2019   Procedure: INDIRECT PHOTOCOAGULATION WITH LASER LEFT EYE;  Surgeon: [name], MD;  Location: [place];  Service: Ophthalmology;  Laterality: Left;   PLACEMENT AND SUTURE OF SECONDARY INTRAOCULAR LENS Right 06/27/2019   Procedure: PLACEMENT AND SUTURE OF SECONDARY INTRAOCULAR LENS;  Surgeon: [name], MD;  Location: [place];  Service: Ophthalmology;  Laterality: Right;   RETINAL DETACHMENT SURGERY Right 03/13/2019   PPV for repair of rheg. RD - Dr. [name]    Social History: Social History   Tobacco Use   Smoking status: Every Day    Packs/day: 0.50    Years: 43.00    Pack years: 21.50    Types: Cigarettes   Smokeless tobacco: Never   Tobacco comments:    pt is trying to aggressively cut back on # cigs smoked daily.   Vaping Use   Vaping Use: Never used  Substance Use Topics   Alcohol use: No   Drug use: No   Please also refer to relevant sections of EMR.  Family History: Family History  Problem Relation Age of Onset   Cancer Mother        brain tumor   Heart disease Father    Cirrhosis Father      Allergies and Medications: No Known Allergies No current facility-administered medications on file prior to encounter.   Current Outpatient Medications on File Prior to Encounter  Medication Sig Dispense Refill   acetaminophen (TYLENOL) 500 MG  tablet Take 1,000 mg by mouth every 6 (six) hours as needed for moderate pain or headache.     amLODipine (NORVASC) 10 MG tablet Take 1 tablet (10 mg total) by mouth daily. 90 tablet 3   atorvastatin (LIPITOR) 80 MG tablet Take 1 tablet (80 mg total) by mouth daily. 90 tablet 3   Blood Glucose Monitoring Suppl (ONETOUCH VERIO) w/Device KIT Check blood sugar once per day 1 kit 0   clopidogrel (PLAVIX) 75 MG tablet Take 1 tablet (75 mg total) by mouth daily. 90 tablet 0   fexofenadine (ALLEGRA ALLERGY) 180 MG tablet Take 1 tablet (180 mg total) by mouth daily. 30 tablet 3   fluticasone (FLONASE) 50 MCG/ACT nasal spray Place 1 spray into both nostrils daily. 16 g 2   glucose blood (ONETOUCH VERIO) test strip  Check sugar once per day 100 each 3   Lancet Devices (ONE TOUCH DELICA LANCING DEV) MISC Check sugar once per day 1 each 0   lisinopril (ZESTRIL) 40 MG tablet Take 1 tablet (40 mg total) by mouth daily. 90 tablet 0   metFORMIN (GLUCOPHAGE) 1000 MG tablet Take 1 tablet (1,000 mg total) by mouth 2 (two) times daily with a meal. 180 tablet 1   OneTouch Delica Lancets 11B MISC Check sugar once per day 100 each 3    Objective: BP (!) 150/61   Pulse 92   Temp (!) 101.3 F (38.5 C) (Oral)   Resp (!) 25   Ht _0  (1.676 m)   Wt 208 lb (94.3 kg)   SpO2 95%   BMI 33.57 kg/m  Exam: General: Pleasant male lying comfortably in bed, NAD HEENT: /AT, PEERL, Right serous effusion, no tinnitus, decrease in hearing or ear pain Cardiovascular: Rate and rhythm, no m/r/g Respiratory: + Expiratory wheezing in all lung fields Gastrointestinal: Diffuse abdominal tenderness, most prominent in lower abdominal fields and right upper quadrant and left upper quadrant. MSK: Able to move all extremities well Derm: Warm and dry Neuro: Awake, oriented to time place and person, no aphasia Psych: Pleasant mood and affect  Labs and Imaging: CBC BMET  Recent Labs  Lab 10/02/20 2217 10/02/20 2253  WBC 8.8  --    HGB 16.8 18.0*  HCT 50.5 53.0*  PLT 202  --    Recent Labs  Lab 10/02/20 2217 10/02/20 2253  NA 136 139  K 3.9 3.9  CL 105 104  CO2 20*  --   BUN 13 14  CREATININE 1.42* 1.20  GLUCOSE 154* 155*  CALCIUM 8.9  --      EKG: My own interpretation is sinus tachycardia, Qtc of 443  DG Chest Portable 1 View  Result Date: 10/02/2020 CLINICAL DATA:  Altered mental status EXAM: PORTABLE CHEST 1 VIEW COMPARISON:  11/18/2010 FINDINGS: Heart and mediastinal contours are within normal limits. No focal opacities or effusions. No acute bony abnormality. IMPRESSION: No active disease. Electronically Signed   By: Rolm Baptise M.D.   On: 10/02/2020 23:01   CT HEAD CODE STROKE WO CONTRAST  Result Date: 10/02/2020 CLINICAL DATA:  Code stroke. Initial evaluation for acute code stroke. EXAM: CT HEAD WITHOUT CONTRAST TECHNIQUE: Contiguous axial images were obtained from the base of the skull through the vertex without intravenous contrast. COMPARISON:  Prior MRI from 06/03/2017 FINDINGS: Brain: Generalized age-related cerebral atrophy with mild chronic small vessel ischemic disease. Small remote lacunar infarct at the right thalamus. No acute intracranial hemorrhage. No acute large vessel territory infarct. No mass lesion, midline shift or mass effect. No hydrocephalus or extra-axial fluid collection. Vascular: No convincing hyperdense vessel. Both MCAs are somewhat dense in appearance, although fairly symmetric in appearance. Scattered vascular calcifications noted within the carotid siphons. Skull: Scalp soft tissues and calvarium within normal limits. Sinuses/Orbits: Globes and orbital soft tissues demonstrate no acute finding. Mild mucosal thickening noted within the sphenoid ethmoidal and maxillary sinuses. Prominent right mastoid and middle ear effusion. Additional trace left mastoid effusion noted as well. Other: None. ASPECTS [facility] Stroke Program Early CT Score) - Ganglionic level infarction (caudate,  lentiform nuclei, internal capsule, insula, M1-M3 cortex): 7 - Supraganglionic infarction (M4-M6 cortex): 3 Total score (0-10 with 10 being normal): 10 IMPRESSION: 1. No acute intracranial infarct or other abnormality. 2. ASPECTS is 10. 3. Age-related cerebral atrophy with mild chronic small vessel ischemic disease, with  small remote right thalamic lacunar infarct. 4. Right mastoid and middle ear effusion. Correlation with physical exam recommended. These results were communicated to Dr. Lorrin Goodell At 10:39 pmon 6/10/2022by text page via the [facility] messaging system. Electronically Signed   By: Jeannine Boga M.D.   On: 10/02/2020 22:47     Dagar, Meredith Staggers, MD 10/02/2020, 11:57 PM PGY-1,  [facility] Intern pager: [phone], text pages welcome   FPTS Upper-Level Resident Addendum   I have independently interviewed and examined the patient. I have discussed the above with the original author and agree with their documentation.  Please see also any attending notes.    Carollee Leitz MD PGY-2, [facility] 10/03/2020 6:28 AM  FPTS Service pager: [phone] (text pages welcome through [facility])

## 2020-10-03 ENCOUNTER — Observation Stay (HOSPITAL_COMMUNITY): Payer: Medicare Other

## 2020-10-03 ENCOUNTER — Encounter (HOSPITAL_COMMUNITY): Payer: Self-pay | Admitting: Family Medicine

## 2020-10-03 ENCOUNTER — Observation Stay (HOSPITAL_COMMUNITY): Payer: Medicare Other | Admitting: Anesthesiology

## 2020-10-03 ENCOUNTER — Encounter (HOSPITAL_COMMUNITY): Payer: Medicare Other

## 2020-10-03 ENCOUNTER — Encounter (HOSPITAL_COMMUNITY)
Admission: EM | Disposition: A | Discharge status: Home / Self Care | Payer: Self-pay | Source: Home / Self Care | Attending: Family Medicine

## 2020-10-03 DIAGNOSIS — F329 Major depressive disorder, single episode, unspecified: Secondary | ICD-10-CM | POA: Diagnosis present

## 2020-10-03 DIAGNOSIS — R4182 Altered mental status, unspecified: Secondary | ICD-10-CM | POA: Diagnosis present

## 2020-10-03 DIAGNOSIS — R4701 Aphasia: Secondary | ICD-10-CM | POA: Diagnosis not present

## 2020-10-03 DIAGNOSIS — F17200 Nicotine dependence, unspecified, uncomplicated: Secondary | ICD-10-CM | POA: Diagnosis not present

## 2020-10-03 DIAGNOSIS — A419 Sepsis, unspecified organism: Secondary | ICD-10-CM | POA: Diagnosis not present

## 2020-10-03 DIAGNOSIS — I6340 Cerebral infarction due to embolism of unspecified cerebral artery: Secondary | ICD-10-CM | POA: Insufficient documentation

## 2020-10-03 DIAGNOSIS — N179 Acute kidney failure, unspecified: Secondary | ICD-10-CM | POA: Diagnosis not present

## 2020-10-03 DIAGNOSIS — K3532 Acute appendicitis with perforation and localized peritonitis, without abscess: Secondary | ICD-10-CM

## 2020-10-03 DIAGNOSIS — I6613 Occlusion and stenosis of bilateral anterior cerebral arteries: Secondary | ICD-10-CM | POA: Diagnosis not present

## 2020-10-03 DIAGNOSIS — I6389 Other cerebral infarction: Secondary | ICD-10-CM

## 2020-10-03 DIAGNOSIS — G9389 Other specified disorders of brain: Secondary | ICD-10-CM | POA: Diagnosis not present

## 2020-10-03 DIAGNOSIS — I6623 Occlusion and stenosis of bilateral posterior cerebral arteries: Secondary | ICD-10-CM | POA: Diagnosis not present

## 2020-10-03 DIAGNOSIS — E782 Mixed hyperlipidemia: Secondary | ICD-10-CM | POA: Diagnosis not present

## 2020-10-03 DIAGNOSIS — Z20822 Contact with and (suspected) exposure to covid-19: Secondary | ICD-10-CM | POA: Diagnosis not present

## 2020-10-03 DIAGNOSIS — E785 Hyperlipidemia, unspecified: Secondary | ICD-10-CM | POA: Diagnosis not present

## 2020-10-03 DIAGNOSIS — I63412 Cerebral infarction due to embolism of left middle cerebral artery: Secondary | ICD-10-CM | POA: Diagnosis not present

## 2020-10-03 DIAGNOSIS — N1831 Chronic kidney disease, stage 3a: Secondary | ICD-10-CM | POA: Diagnosis present

## 2020-10-03 DIAGNOSIS — R109 Unspecified abdominal pain: Secondary | ICD-10-CM | POA: Diagnosis not present

## 2020-10-03 DIAGNOSIS — K353 Acute appendicitis with localized peritonitis, without perforation or gangrene: Secondary | ICD-10-CM | POA: Diagnosis not present

## 2020-10-03 DIAGNOSIS — E669 Obesity, unspecified: Secondary | ICD-10-CM | POA: Diagnosis present

## 2020-10-03 DIAGNOSIS — Z8673 Personal history of transient ischemic attack (TIA), and cerebral infarction without residual deficits: Secondary | ICD-10-CM | POA: Diagnosis not present

## 2020-10-03 DIAGNOSIS — G928 Other toxic encephalopathy: Secondary | ICD-10-CM | POA: Diagnosis not present

## 2020-10-03 DIAGNOSIS — K760 Fatty (change of) liver, not elsewhere classified: Secondary | ICD-10-CM | POA: Diagnosis not present

## 2020-10-03 DIAGNOSIS — R6520 Severe sepsis without septic shock: Secondary | ICD-10-CM | POA: Diagnosis not present

## 2020-10-03 DIAGNOSIS — G4733 Obstructive sleep apnea (adult) (pediatric): Secondary | ICD-10-CM | POA: Diagnosis not present

## 2020-10-03 DIAGNOSIS — I63422 Cerebral infarction due to embolism of left anterior cerebral artery: Secondary | ICD-10-CM | POA: Diagnosis not present

## 2020-10-03 DIAGNOSIS — I10 Essential (primary) hypertension: Secondary | ICD-10-CM | POA: Diagnosis not present

## 2020-10-03 DIAGNOSIS — K3580 Unspecified acute appendicitis: Secondary | ICD-10-CM | POA: Diagnosis not present

## 2020-10-03 DIAGNOSIS — F17210 Nicotine dependence, cigarettes, uncomplicated: Secondary | ICD-10-CM | POA: Diagnosis present

## 2020-10-03 DIAGNOSIS — J441 Chronic obstructive pulmonary disease with (acute) exacerbation: Secondary | ICD-10-CM | POA: Diagnosis not present

## 2020-10-03 DIAGNOSIS — E11319 Type 2 diabetes mellitus with unspecified diabetic retinopathy without macular edema: Secondary | ICD-10-CM | POA: Diagnosis not present

## 2020-10-03 DIAGNOSIS — E1165 Type 2 diabetes mellitus with hyperglycemia: Secondary | ICD-10-CM | POA: Diagnosis present

## 2020-10-03 DIAGNOSIS — I6521 Occlusion and stenosis of right carotid artery: Secondary | ICD-10-CM | POA: Diagnosis present

## 2020-10-03 DIAGNOSIS — I700 Atherosclerosis of aorta: Secondary | ICD-10-CM | POA: Diagnosis present

## 2020-10-03 DIAGNOSIS — I129 Hypertensive chronic kidney disease with stage 1 through stage 4 chronic kidney disease, or unspecified chronic kidney disease: Secondary | ICD-10-CM | POA: Diagnosis not present

## 2020-10-03 DIAGNOSIS — E119 Type 2 diabetes mellitus without complications: Secondary | ICD-10-CM | POA: Diagnosis not present

## 2020-10-03 DIAGNOSIS — Z7984 Long term (current) use of oral hypoglycemic drugs: Secondary | ICD-10-CM | POA: Diagnosis not present

## 2020-10-03 DIAGNOSIS — E1122 Type 2 diabetes mellitus with diabetic chronic kidney disease: Secondary | ICD-10-CM | POA: Diagnosis not present

## 2020-10-03 DIAGNOSIS — Z6833 Body mass index (BMI) 33.0-33.9, adult: Secondary | ICD-10-CM | POA: Diagnosis not present

## 2020-10-03 HISTORY — PX: LAPAROSCOPIC APPENDECTOMY: SHX408

## 2020-10-03 LAB — CBC WITH DIFFERENTIAL/PLATELET
Abs Immature Granulocytes: 0.14 10*3/uL — ABNORMAL HIGH (ref 0.00–0.07)
Basophils Absolute: 0.1 10*3/uL (ref 0.0–0.1)
Basophils Relative: 0 %
Eosinophils Absolute: 0 10*3/uL (ref 0.0–0.5)
Eosinophils Relative: 0 %
HCT: 44.8 % (ref 39.0–52.0)
Hemoglobin: 14.8 g/dL (ref 13.0–17.0)
Immature Granulocytes: 1 %
Lymphocytes Relative: 4 %
Lymphs Abs: 0.6 10*3/uL — ABNORMAL LOW (ref 0.7–4.0)
MCH: 29.8 pg (ref 26.0–34.0)
MCHC: 33 g/dL (ref 30.0–36.0)
MCV: 90.1 fL (ref 80.0–100.0)
Monocytes Absolute: 0.7 10*3/uL (ref 0.1–1.0)
Monocytes Relative: 5 %
Neutro Abs: 14.4 10*3/uL — ABNORMAL HIGH (ref 1.7–7.7)
Neutrophils Relative %: 90 %
Platelets: 158 10*3/uL (ref 150–400)
RBC: 4.97 MIL/uL (ref 4.22–5.81)
RDW: 13.6 % (ref 11.5–15.5)
WBC: 15.9 10*3/uL — ABNORMAL HIGH (ref 4.0–10.5)
nRBC: 0 % (ref 0.0–0.2)

## 2020-10-03 LAB — GLUCOSE, CAPILLARY
Glucose-Capillary: 204 mg/dL — ABNORMAL HIGH (ref 70–99)
Glucose-Capillary: 188 mg/dL — ABNORMAL HIGH (ref 70–99)
Glucose-Capillary: 174 mg/dL — ABNORMAL HIGH (ref 70–99)
Glucose-Capillary: 178 mg/dL — ABNORMAL HIGH (ref 70–99)
Glucose-Capillary: 196 mg/dL — ABNORMAL HIGH (ref 70–99)
Glucose-Capillary: 235 mg/dL — ABNORMAL HIGH (ref 70–99)

## 2020-10-03 LAB — COMPREHENSIVE METABOLIC PANEL
ALT: 28 U/L (ref 0–44)
AST: 20 U/L (ref 15–41)
Albumin: 3.2 g/dL — ABNORMAL LOW (ref 3.5–5.0)
Alkaline Phosphatase: 73 U/L (ref 38–126)
Anion gap: 9 (ref 5–15)
BUN: 15 mg/dL (ref 8–23)
CO2: 19 mmol/L — ABNORMAL LOW (ref 22–32)
Calcium: 8 mg/dL — ABNORMAL LOW (ref 8.9–10.3)
Chloride: 107 mmol/L (ref 98–111)
Creatinine, Ser: 1.34 mg/dL — ABNORMAL HIGH (ref 0.61–1.24)
GFR, Estimated: 57 mL/min — ABNORMAL LOW (ref >60)
Glucose, Bld: 159 mg/dL — ABNORMAL HIGH (ref 70–99)
Potassium: 4.1 mmol/L (ref 3.5–5.1)
Sodium: 135 mmol/L (ref 135–145)
Total Bilirubin: 1.1 mg/dL (ref 0.3–1.2)
Total Protein: 6.3 g/dL — ABNORMAL LOW (ref 6.5–8.1)

## 2020-10-03 LAB — ECHOCARDIOGRAM COMPLETE
AR max vel: 3.36 cm2
AV Area VTI: 3.4 cm2
AV Area mean vel: 3.03 cm2
AV Mean grad: 4 mmHg
AV Peak grad: 8.1 mmHg
Ao pk vel: 1.42 m/s
Area-P 1/2: 3.42 cm2
Height: 66 in
MV VTI: 2.47 cm2
S' Lateral: 2.7 cm
Weight: 3222.4 oz

## 2020-10-03 LAB — TROPONIN I (HIGH SENSITIVITY)
Troponin I (High Sensitivity): 32 ng/L — ABNORMAL HIGH (ref <18)
Troponin I (High Sensitivity): 26 ng/L — ABNORMAL HIGH (ref <18)

## 2020-10-03 LAB — RAPID URINE DRUG SCREEN, HOSP PERFORMED
Amphetamines: NOT DETECTED
Barbiturates: NOT DETECTED
Benzodiazepines: NOT DETECTED
Cocaine: NOT DETECTED
Opiates: NOT DETECTED
Tetrahydrocannabinol: NOT DETECTED

## 2020-10-03 LAB — LACTIC ACID, PLASMA
Lactic Acid, Venous: 2.3 mmol/L (ref 0.5–1.9)
Lactic Acid, Venous: 2 mmol/L (ref 0.5–1.9)

## 2020-10-03 LAB — LDL CHOLESTEROL, DIRECT: Direct LDL: 90 mg/dL (ref 0–99)

## 2020-10-03 LAB — RESP PANEL BY RT-PCR (FLU A&B, COVID) ARPGX2
Influenza A by PCR: NEGATIVE
Influenza B by PCR: NEGATIVE
SARS Coronavirus 2 by RT PCR: NEGATIVE

## 2020-10-03 LAB — URINALYSIS, ROUTINE W REFLEX MICROSCOPIC
Bilirubin Urine: NEGATIVE
Glucose, UA: NEGATIVE mg/dL
Hgb urine dipstick: NEGATIVE
Ketones, ur: NEGATIVE mg/dL
Leukocytes,Ua: NEGATIVE
Nitrite: NEGATIVE
Protein, ur: NEGATIVE mg/dL
Specific Gravity, Urine: 1.017 (ref 1.005–1.030)
pH: 5 (ref 5.0–8.0)

## 2020-10-03 LAB — TSH: TSH: 0.858 u[IU]/mL (ref 0.350–4.500)

## 2020-10-03 LAB — AMMONIA: Ammonia: 17 umol/L (ref 9–35)

## 2020-10-03 LAB — HIV ANTIBODY (ROUTINE TESTING W REFLEX): HIV Screen 4th Generation wRfx: NONREACTIVE

## 2020-10-03 LAB — PROCALCITONIN: Procalcitonin: 31.73 ng/mL

## 2020-10-03 SURGERY — APPENDECTOMY, LAPAROSCOPIC
Anesthesia: General | Site: Abdomen

## 2020-10-03 MED ORDER — MIDAZOLAM HCL 2 MG/2ML IJ SOLN
INTRAMUSCULAR | Status: AC
  Filled 2020-10-03: qty 2

## 2020-10-03 MED ORDER — NICOTINE 21 MG/24HR TD PT24: 21.0000 mg | MEDICATED_PATCH | Freq: Every day | TRANSDERMAL | Status: DC

## 2020-10-03 MED ORDER — SUCCINYLCHOLINE CHLORIDE 20 MG/ML IJ SOLN
INTRAMUSCULAR | Status: DC | PRN
  Administered 2020-10-03: 140 mg via INTRAVENOUS

## 2020-10-03 MED ORDER — GABAPENTIN 300 MG PO CAPS
300.0000 mg | ORAL_CAPSULE | ORAL | Status: AC
  Administered 2020-10-03: 300 mg via ORAL
  Filled 2020-10-03: qty 1

## 2020-10-03 MED ORDER — PREDNISONE 20 MG PO TABS: 40.0000 mg | ORAL_TABLET | Freq: Every day | ORAL | Status: DC

## 2020-10-03 MED ORDER — IPRATROPIUM-ALBUTEROL 0.5-2.5 (3) MG/3ML IN SOLN
3.0000 mL | Freq: Four times a day (QID) | RESPIRATORY_TRACT | Status: DC
  Administered 2020-10-03: 3 mL via RESPIRATORY_TRACT
  Filled 2020-10-03 (×2): qty 3

## 2020-10-03 MED ORDER — PROPOFOL 10 MG/ML IV BOLUS
INTRAVENOUS | Status: AC
  Filled 2020-10-03: qty 20

## 2020-10-03 MED ORDER — FENTANYL CITRATE (PF) 250 MCG/5ML IJ SOLN
INTRAMUSCULAR | Status: AC
  Filled 2020-10-03: qty 5

## 2020-10-03 MED ORDER — LIDOCAINE HCL (PF) 2 % IJ SOLN
INTRAMUSCULAR | Status: AC
  Filled 2020-10-03: qty 5

## 2020-10-03 MED ORDER — SODIUM CHLORIDE 0.9 % IV SOLN: INTRAVENOUS | Status: DC

## 2020-10-03 MED ORDER — IOHEXOL 300 MG/ML  SOLN
75.0000 mL | Freq: Once | INTRAMUSCULAR | Status: AC | PRN
  Administered 2020-10-03: 75 mL via INTRAVENOUS

## 2020-10-03 MED ORDER — DEXAMETHASONE SODIUM PHOSPHATE 10 MG/ML IJ SOLN
INTRAMUSCULAR | Status: DC | PRN
  Administered 2020-10-03: 5 mg via INTRAVENOUS

## 2020-10-03 MED ORDER — METRONIDAZOLE 500 MG/100ML IV SOLN: 500.0000 mg | Freq: Three times a day (TID) | INTRAVENOUS | Status: DC

## 2020-10-03 MED ORDER — ONDANSETRON HCL 4 MG/2ML IJ SOLN
INTRAMUSCULAR | Status: DC | PRN
  Administered 2020-10-03: 4 mg via INTRAVENOUS

## 2020-10-03 MED ORDER — DEXAMETHASONE SODIUM PHOSPHATE 10 MG/ML IJ SOLN
INTRAMUSCULAR | Status: AC
  Filled 2020-10-03: qty 1

## 2020-10-03 MED ORDER — CHLORHEXIDINE GLUCONATE 0.12 % MT SOLN: 15.0000 mL | Freq: Once | OROMUCOSAL | Status: AC

## 2020-10-03 MED ORDER — BUPIVACAINE HCL (PF) 0.25 % IJ SOLN
INTRAMUSCULAR | Status: AC
  Filled 2020-10-03: qty 30

## 2020-10-03 MED ORDER — PHENYLEPHRINE HCL-NACL 10-0.9 MG/250ML-% IV SOLN
INTRAVENOUS | Status: DC | PRN
  Administered 2020-10-03: 50 ug/min via INTRAVENOUS

## 2020-10-03 MED ORDER — OXYCODONE HCL 5 MG PO TABS: 5.0000 mg | ORAL_TABLET | Freq: Once | ORAL | Status: DC | PRN

## 2020-10-03 MED ORDER — ROCURONIUM BROMIDE 10 MG/ML (PF) SYRINGE
PREFILLED_SYRINGE | INTRAVENOUS | Status: AC
  Filled 2020-10-03: qty 10

## 2020-10-03 MED ORDER — FENTANYL CITRATE (PF) 250 MCG/5ML IJ SOLN
INTRAMUSCULAR | Status: DC | PRN
  Administered 2020-10-03 (×2): 50 ug via INTRAVENOUS

## 2020-10-03 MED ORDER — PROMETHAZINE HCL 25 MG/ML IJ SOLN: 6.2500 mg | INTRAMUSCULAR | Status: DC | PRN

## 2020-10-03 MED ORDER — LACTATED RINGERS IV SOLN: INTRAVENOUS | Status: DC

## 2020-10-03 MED ORDER — OXYCODONE HCL 5 MG/5ML PO SOLN: 5.0000 mg | Freq: Once | ORAL | Status: DC | PRN

## 2020-10-03 MED ORDER — PROPOFOL 10 MG/ML IV BOLUS
INTRAVENOUS | Status: DC | PRN
  Administered 2020-10-03: 130 mg via INTRAVENOUS

## 2020-10-03 MED ORDER — HYDROMORPHONE HCL 1 MG/ML IJ SOLN: 0.2500 mg | INTRAMUSCULAR | Status: DC | PRN

## 2020-10-03 MED ORDER — ATORVASTATIN CALCIUM 80 MG PO TABS
80.0000 mg | ORAL_TABLET | Freq: Every day | ORAL | Status: DC
  Administered 2020-10-04 – 2020-10-05 (×2): 80 mg via ORAL
  Filled 2020-10-03 (×2): qty 1

## 2020-10-03 MED ORDER — ONDANSETRON HCL 4 MG/2ML IJ SOLN
INTRAMUSCULAR | Status: AC
  Filled 2020-10-03: qty 2

## 2020-10-03 MED ORDER — BUPIVACAINE HCL 0.25 % IJ SOLN
INTRAMUSCULAR | Status: DC | PRN
  Administered 2020-10-03: 7 mL

## 2020-10-03 MED ORDER — CHLORHEXIDINE GLUCONATE 0.12 % MT SOLN
OROMUCOSAL | Status: AC
  Administered 2020-10-03: 15 mL via OROMUCOSAL
  Filled 2020-10-03: qty 15

## 2020-10-03 MED ORDER — MEPERIDINE HCL 25 MG/ML IJ SOLN: 6.2500 mg | INTRAMUSCULAR | Status: DC | PRN

## 2020-10-03 MED ORDER — INSULIN ASPART 100 UNIT/ML IJ SOLN
0.0000 [IU] | Freq: Three times a day (TID) | INTRAMUSCULAR | Status: DC
  Administered 2020-10-03 (×2): 2 [IU] via SUBCUTANEOUS
  Administered 2020-10-03: 3 [IU] via SUBCUTANEOUS
  Administered 2020-10-04: 1 [IU] via SUBCUTANEOUS
  Administered 2020-10-04: 2 [IU] via SUBCUTANEOUS

## 2020-10-03 MED ORDER — ENOXAPARIN SODIUM 40 MG/0.4ML IJ SOSY: 40.0000 mg | PREFILLED_SYRINGE | INTRAMUSCULAR | Status: DC

## 2020-10-03 MED ORDER — DOXYCYCLINE HYCLATE 100 MG PO TABS: 100.0000 mg | ORAL_TABLET | Freq: Two times a day (BID) | ORAL | Status: DC

## 2020-10-03 MED ORDER — MIDAZOLAM HCL 5 MG/5ML IJ SOLN
INTRAMUSCULAR | Status: DC | PRN
  Administered 2020-10-03: 2 mg via INTRAVENOUS

## 2020-10-03 MED ORDER — LIDOCAINE 2% (20 MG/ML) 5 ML SYRINGE
INTRAMUSCULAR | Status: DC | PRN
  Administered 2020-10-03: 20 mg via INTRAVENOUS

## 2020-10-03 MED ORDER — ALBUTEROL SULFATE (2.5 MG/3ML) 0.083% IN NEBU
2.5000 mg | INHALATION_SOLUTION | RESPIRATORY_TRACT | Status: DC | PRN
  Administered 2020-10-05: 2.5 mg via RESPIRATORY_TRACT
  Filled 2020-10-03: qty 3

## 2020-10-03 MED ORDER — 0.9 % SODIUM CHLORIDE (POUR BTL) OPTIME
TOPICAL | Status: DC | PRN
  Administered 2020-10-03: 1000 mL

## 2020-10-03 MED ORDER — PIPERACILLIN-TAZOBACTAM 3.375 G IVPB
3.3750 g | Freq: Three times a day (TID) | INTRAVENOUS | Status: DC
  Administered 2020-10-03 – 2020-10-05 (×8): 3.375 g via INTRAVENOUS
  Filled 2020-10-03 (×9): qty 50

## 2020-10-03 MED ORDER — SUGAMMADEX SODIUM 200 MG/2ML IV SOLN
INTRAVENOUS | Status: DC | PRN
  Administered 2020-10-03: 200 mg via INTRAVENOUS

## 2020-10-03 MED ORDER — SODIUM CHLORIDE 0.9 % IV SOLN
2.0000 g | INTRAVENOUS | Status: DC
  Filled 2020-10-03: qty 20

## 2020-10-03 MED ORDER — SODIUM CHLORIDE 0.9 % IR SOLN
Status: DC | PRN
  Administered 2020-10-03: 1000 mL

## 2020-10-03 MED ORDER — ACETAMINOPHEN 500 MG PO TABS
1000.0000 mg | ORAL_TABLET | ORAL | Status: AC
  Administered 2020-10-03: 1000 mg via ORAL
  Filled 2020-10-03: qty 2

## 2020-10-03 MED ORDER — METHYLPREDNISOLONE SODIUM SUCC 40 MG IJ SOLR
40.0000 mg | Freq: Once | INTRAMUSCULAR | Status: AC
  Administered 2020-10-03: 40 mg via INTRAVENOUS
  Filled 2020-10-03: qty 1

## 2020-10-03 MED ORDER — MIDAZOLAM HCL 2 MG/2ML IJ SOLN
0.5000 mg | Freq: Once | INTRAMUSCULAR | Status: DC | PRN
Start: 2020-10-03 — End: 2020-10-03

## 2020-10-03 MED ORDER — ROCURONIUM BROMIDE 10 MG/ML (PF) SYRINGE
PREFILLED_SYRINGE | INTRAVENOUS | Status: DC | PRN
  Administered 2020-10-03: 30 mg via INTRAVENOUS

## 2020-10-03 MED ORDER — CLOPIDOGREL BISULFATE 75 MG PO TABS: 75.0000 mg | ORAL_TABLET | Freq: Every day | ORAL | Status: DC

## 2020-10-03 SURGICAL SUPPLY — 43 items
APPLIER CLIP 5 13 M/L LIGAMAX5 (MISCELLANEOUS)
BLADE CLIPPER SURG (BLADE) IMPLANT
CANISTER SUCT 3000ML PPV (MISCELLANEOUS) ×3 IMPLANT
CHLORAPREP W/TINT 26 (MISCELLANEOUS) ×3 IMPLANT
CLIP APPLIE 5 13 M/L LIGAMAX5 (MISCELLANEOUS) IMPLANT
CLIP VESOLOCK XL 6/CT (CLIP) ×3 IMPLANT
COVER SURGICAL LIGHT HANDLE (MISCELLANEOUS) ×3 IMPLANT
COVER TRANSDUCER ULTRASND (DRAPES) ×3 IMPLANT
COVER WAND RF STERILE (DRAPES) ×3 IMPLANT
DERMABOND ADVANCED (GAUZE/BANDAGES/DRESSINGS) ×2
DERMABOND ADVANCED .7 DNX12 (GAUZE/BANDAGES/DRESSINGS) ×1 IMPLANT
ELECT REM PT RETURN 9FT ADLT (ELECTROSURGICAL) ×3
ELECTRODE REM PT RTRN 9FT ADLT (ELECTROSURGICAL) ×1 IMPLANT
ENDOLOOP SUT PDS II  0 18 (SUTURE)
ENDOLOOP SUT PDS II 0 18 (SUTURE) IMPLANT
GLOVE BIO SURGEON STRL SZ7.5 (GLOVE) ×3 IMPLANT
GOWN STRL REUS W/ TWL LRG LVL3 (GOWN DISPOSABLE) ×1 IMPLANT
GOWN STRL REUS W/ TWL XL LVL3 (GOWN DISPOSABLE) ×1 IMPLANT
GOWN STRL REUS W/TWL LRG LVL3 (GOWN DISPOSABLE) ×3
GOWN STRL REUS W/TWL XL LVL3 (GOWN DISPOSABLE) ×3
GRASPER SUT TROCAR 14GX15 (MISCELLANEOUS) ×3 IMPLANT
KIT BASIN OR (CUSTOM PROCEDURE TRAY) ×3 IMPLANT
KIT TURNOVER KIT B (KITS) ×3 IMPLANT
NEEDLE INSUFFLATION 14GA 120MM (NEEDLE) ×3 IMPLANT
NS IRRIG 1000ML POUR BTL (IV SOLUTION) ×3 IMPLANT
PAD ARMBOARD 7.5X6 YLW CONV (MISCELLANEOUS) ×6 IMPLANT
POUCH RETRIEVAL ECOSAC 10 (ENDOMECHANICALS) ×1 IMPLANT
POUCH RETRIEVAL ECOSAC 10MM (ENDOMECHANICALS) ×3
SCISSORS LAP 5X35 DISP (ENDOMECHANICALS) ×3 IMPLANT
SET IRRIG TUBING LAPAROSCOPIC (IRRIGATION / IRRIGATOR) ×3 IMPLANT
SET TUBE SMOKE EVAC HIGH FLOW (TUBING) ×3 IMPLANT
SLEEVE ENDOPATH XCEL 5M (ENDOMECHANICALS) ×3 IMPLANT
SPECIMEN JAR SMALL (MISCELLANEOUS) ×3 IMPLANT
SUT MNCRL AB 4-0 PS2 18 (SUTURE) ×3 IMPLANT
TOWEL GREEN STERILE (TOWEL DISPOSABLE) ×3 IMPLANT
TOWEL GREEN STERILE FF (TOWEL DISPOSABLE) ×3 IMPLANT
TRAY FOLEY W/BAG SLVR 16FR (SET/KITS/TRAYS/PACK)
TRAY FOLEY W/BAG SLVR 16FR ST (SET/KITS/TRAYS/PACK) IMPLANT
TRAY LAPAROSCOPIC MC (CUSTOM PROCEDURE TRAY) ×3 IMPLANT
TROCAR XCEL NON-BLD 11X100MML (ENDOMECHANICALS) ×3 IMPLANT
TROCAR XCEL NON-BLD 5MMX100MML (ENDOMECHANICALS) ×3 IMPLANT
WARMER LAPAROSCOPE (MISCELLANEOUS) ×3 IMPLANT
WATER STERILE IRR 1000ML POUR (IV SOLUTION) ×3 IMPLANT

## 2020-10-03 NOTE — Anesthesia Procedure Notes (Signed)
Procedure Name: Intubation Date/Time: 10/03/2020 9:45 AM Performed by: Kyung Rudd, CRNA Pre-anesthesia Checklist: Patient identified, Suction available, Emergency Drugs available and Patient being monitored Patient Re-evaluated:Patient Re-evaluated prior to induction Oxygen Delivery Method: Circle system utilized Preoxygenation: Pre-oxygenation with 100% oxygen Induction Type: IV induction, Rapid sequence and Cricoid Pressure applied Laryngoscope Size: Mac and 4 Grade View: Grade I Tube type: Oral Tube size: 7.5 mm Number of attempts: 1 Airway Equipment and Method: Stylet Placement Confirmation: ETT inserted through vocal cords under direct vision, positive ETCO2 and breath sounds checked- equal and bilateral Secured at: 22 cm Tube secured with: Tape Dental Injury: Teeth and Oropharynx as per pre-operative assessment

## 2020-10-03 NOTE — Progress Notes (Signed)
Was unable to see patient as he was in surgery

## 2020-10-03 NOTE — Progress Notes (Signed)
PT Cancellation Note  Patient Details Name: Jonathan [name] MRN: [id number] DOB: 07/15/1950   Cancelled Treatment:    Reason Eval/Treat Not Completed: Patient at procedure or test/unavailable.  Will hold today, pt in surgery for a lap appy.  Will see 6/12 as able. 10/03/2020  Ginger Carne., PT Acute Rehabilitation Services [phone]  (pager) [phone]  (office)   [name] 10/03/2020, 10:10 AM

## 2020-10-03 NOTE — ED Notes (Signed)
MRI called- will call family to confirm implant status. Patient has 3 people ahead of him at this time.

## 2020-10-03 NOTE — Transfer of Care (Signed)
Immediate Anesthesia Transfer of Care Note  Patient: Jonathan [name]  Procedure(s) Performed: APPENDECTOMY LAPAROSCOPIC (Abdomen)  Patient Location: PACU  Anesthesia Type:General  Level of Consciousness: drowsy  Airway & Oxygen Therapy: Patient Spontanous Breathing and Patient connected to face mask oxygen  Post-op Assessment: Report given to RN and Post -op Vital signs reviewed and stable  Post vital signs: Reviewed and stable  Last Vitals:  Vitals Value Taken Time  BP 138/66 10/03/20 1043  Temp    Pulse 57 10/03/20 1045  Resp 20 10/03/20 1045  SpO2 93 % 10/03/20 1045  Vitals shown include unvalidated device data.  Last Pain:  Vitals:   10/03/20 0925  TempSrc: Oral  PainSc: 0-No pain         Complications: No notable events documented.

## 2020-10-03 NOTE — Consult Note (Signed)
[facility] Surgery ConsultNote  Jonathan [name] 26-Jun-1950  [id number].    Requesting MD: Janit Pagan Chief Complaint/Reason for Consult: appendicitis  HPI:  Jonathan [name] is a 70yo male PMH HTN, HLD, COPD, DM, hx CVA 3 years ago on plavix (last dose 6/8) who presented to [facility] last night with abdominal pain. States that the pain started Thursday evening around midnight. He developed generalized abdominal pain. Now the pain is more RLQ and severe. Associated with nausea, vomiting, diarrhea, and chills. No known fever. Pain gradually worsening so he came to the ED. Initially code stroke was called due to AMS. Stroke work up negative. AMS felt to be secondary to sepsis. CT abdomen/pelvis obtained today and shows acute appendicitis with perforation, no abscess or generalized pneumoperitoneum. General surgery asked to see.  Abdominal surgical history: none Smokes 1 PPD Denies alcohol or illicit drug use  ROS: Review of Systems  Constitutional:  Positive for chills. Negative for fever.  Respiratory: Negative.    Cardiovascular: Negative.   Gastrointestinal:  Positive for abdominal pain, diarrhea, nausea and vomiting.  Genitourinary: Negative.   Musculoskeletal: Negative.    All systems reviewed and otherwise negative except for as above  Family History  Problem Relation Age of Onset   Cancer Mother        brain tumor   Heart disease Father    Cirrhosis Father     Past Medical History:  Diagnosis Date   Anxiety    Arthritis    COPD (chronic obstructive pulmonary disease) (HCC)    Depression    Diabetes mellitus without complication (HCC)    Diarrhea    History of kidney stones    HTN (hypertension)    Hyperlipidemia    Hypertensive retinopathy    OU   Pneumonia    Retinal detachment    Rheg. RD OD   Sleep apnea    does not use cpap   Stroke [facility])    denies any deficits    Past Surgical History:  Procedure Laterality Date   CATARACT EXTRACTION Bilateral     Dr. Jana Hakim     EYE SURGERY Bilateral    Cat Sx OU; RD repair OD   GAS INSERTION Right 03/13/2019   Procedure: INSERTION OF GAS (C3F8) RIGHT EYE;  Surgeon: Rennis Chris, MD;  Location: [facility] OR;  Service: Ophthalmology;  Laterality: Right;   LASER PHOTO ABLATION Right 03/13/2019   Procedure: LASER PHOTO  ABLATION RIGHT EYE;  Surgeon: Rennis Chris, MD;  Location: [facility] OR;  Service: Ophthalmology;  Laterality: Right;   none     PARS PLANA VITRECTOMY Right 03/13/2019   Procedure: 25 GAUGE PARS PLANA VITRECTOMY WITH  INTRAOCULAR LENSE EXPLANTATION RIGHT EYE.;  Surgeon: Rennis Chris, MD;  Location: [facility] OR;  Service: Ophthalmology;  Laterality: Right;   PARS PLANA VITRECTOMY Right 06/27/2019   Procedure: PARS PLANA VITRECTOMY WITH 25 GAUGE;  Surgeon: Rennis Chris, MD;  Location: [facility] OR;  Service: Ophthalmology;  Laterality: Right;   PHOTOCOAGULATION WITH LASER Left 03/13/2019   Procedure: INDIRECT PHOTOCOAGULATION WITH LASER LEFT EYE;  Surgeon: Rennis Chris, MD;  Location: [facility] OR;  Service: Ophthalmology;  Laterality: Left;   PLACEMENT AND SUTURE OF SECONDARY INTRAOCULAR LENS Right 06/27/2019   Procedure: PLACEMENT AND SUTURE OF SECONDARY INTRAOCULAR LENS;  Surgeon: Rennis Chris, MD;  Location: [facility] OR;  Service: Ophthalmology;  Laterality: Right;   RETINAL DETACHMENT SURGERY Right 03/13/2019   PPV for repair of rheg. RD - Dr. Rennis Chris  Social History:  reports that he has been smoking cigarettes. He has a 21.50 pack-year smoking history. He has never used smokeless tobacco. He reports that he does not drink alcohol and does not use drugs.  Allergies: No Known Allergies  Medications Prior to Admission  Medication Sig Dispense Refill   acetaminophen (TYLENOL) 500 MG tablet Take 1,000 mg by mouth every 6 (six) hours as needed for moderate pain or headache.     amLODipine (NORVASC) 10 MG tablet Take 1 tablet (10 mg total) by mouth daily. 90 tablet 3   atorvastatin (LIPITOR) 80 MG  tablet Take 1 tablet (80 mg total) by mouth daily. 90 tablet 3   Blood Glucose Monitoring Suppl (ONETOUCH VERIO) w/Device KIT Check blood sugar once per day 1 kit 0   clopidogrel (PLAVIX) 75 MG tablet Take 1 tablet (75 mg total) by mouth daily. 90 tablet 0   fexofenadine (ALLEGRA ALLERGY) 180 MG tablet Take 1 tablet (180 mg total) by mouth daily. (Patient taking differently: Take 180 mg by mouth daily as needed for allergies or rhinitis.) 30 tablet 3   fluticasone (FLONASE) 50 MCG/ACT nasal spray Place 1 spray into both nostrils daily. (Patient taking differently: Place 1 spray into both nostrils daily as needed for allergies or rhinitis.) 16 g 2   glucose blood (ONETOUCH VERIO) test strip Check sugar once per day 100 each 3   Lancet Devices (ONE TOUCH DELICA LANCING DEV) MISC Check sugar once per day 1 each 0   lisinopril (ZESTRIL) 40 MG tablet Take 1 tablet (40 mg total) by mouth daily. 90 tablet 0   metFORMIN (GLUCOPHAGE) 1000 MG tablet Take 1 tablet (1,000 mg total) by mouth 2 (two) times daily with a meal. 180 tablet 1   OneTouch Delica Lancets 21Y MISC Check sugar once per day 100 each 3    Prior to Admission medications   Medication Sig Start Date End Date Taking? Authorizing Provider  acetaminophen (TYLENOL) 500 MG tablet Take 1,000 mg by mouth every 6 (six) hours as needed for moderate pain or headache.   Yes [provider]  amLODipine (NORVASC) 10 MG tablet Take 1 tablet (10 mg total) by mouth daily. 01/09/20  Yes Leeanne Rio, MD  atorvastatin (LIPITOR) 80 MG tablet Take 1 tablet (80 mg total) by mouth daily. 01/09/20  Yes Leeanne Rio, MD  Blood Glucose Monitoring Suppl [facility] VERIO) w/Device KIT Check blood sugar once per day 12/03/18  Yes Leeanne Rio, MD  clopidogrel (PLAVIX) 75 MG tablet Take 1 tablet (75 mg total) by mouth daily. 01/09/20  Yes Leeanne Rio, MD  fexofenadine [facility] ALLERGY) 180 MG tablet Take 1 tablet (180 mg total) by  mouth daily. Patient taking differently: Take 180 mg by mouth daily as needed for allergies or rhinitis. 01/14/20  Yes Martyn Malay, MD  fluticasone (FLONASE) 50 MCG/ACT nasal spray Place 1 spray into both nostrils daily. Patient taking differently: Place 1 spray into both nostrils daily as needed for allergies or rhinitis. 01/14/20  Yes Martyn Malay, MD  glucose blood [facility] VERIO) test strip Check sugar once per day 12/03/18  Yes Leeanne Rio, MD  Lancet Devices (ONE TOUCH DELICA LANCING DEV) MISC Check sugar once per day 12/03/18  Yes Leeanne Rio, MD  lisinopril (ZESTRIL) 40 MG tablet Take 1 tablet (40 mg total) by mouth daily. 03/26/20  Yes Leeanne Rio, MD  metFORMIN (GLUCOPHAGE) 1000 MG tablet Take 1 tablet (1,000 mg total) by  mouth 2 (two) times daily with a meal. 03/26/20  Yes Leeanne Rio, MD  OneTouch Delica Lancets 93T MISC Check sugar once per day 12/03/18  Yes Leeanne Rio, MD    Blood pressure 130/67, pulse 67, temperature 98 F (36.7 C), temperature source Oral, resp. rate 18, height $RemoveBe'5\' 6"'mdkMfrLGG$  (1.676 m), weight 91.4 kg, SpO2 95 %. Physical Exam: General: pleasant, WD/WN male who is laying in bed in NAD HEENT: head is normocephalic, atraumatic.  Sclera are noninjected.  PERRL.  Ears and nose without any masses or lesions.  Mouth is pink and moist. Dentition fair Heart: regular, rate, and rhythm.  Normal s1,s2. No obvious murmurs, gallops, or rubs noted.  Palpable pedal pulses bilaterally  Lungs: CTAB, no wheezes, rhonchi, or rales noted.  Respiratory effort nonlabored Abd: soft, mild distension, +BS, no masses, hernias, or organomegaly. TTP RLQ with voluntary guarding, +Rovsings sign. No generalized abdominal TTP or peritonitis MS: no BUE/BLE edema, calves soft and nontender Skin: warm and dry with no masses, lesions, or rashes Psych: A&Ox4 with an appropriate affect Neuro: cranial nerves grossly intact, equal strength in BUE/BLE bilaterally,  normal speech, thought process intact  Results for orders placed or performed during the hospital encounter of 10/02/20 (from the past 48 hour(s))  Protime-INR     Status: None   Collection Time: 10/02/20 10:17 PM  Result Value Ref Range   Prothrombin Time 13.1 11.4 - 15.2 seconds   INR 1.0 0.8 - 1.2    Comment: (NOTE) INR goal varies based on device and disease states. Performed at [facility], [place] [address], [place], [place] [address]   APTT     Status: None   Collection Time: 10/02/20 10:17 PM  Result Value Ref Range   aPTT 24 24 - 36 seconds    Comment: Performed at [place] [address], [place], [place] [address]  CBC     Status: None   Collection Time: 10/02/20 10:17 PM  Result Value Ref Range   WBC 8.8 4.0 - 10.5 K/uL   RBC 5.69 4.22 - 5.81 MIL/uL   Hemoglobin 16.8 13.0 - 17.0 g/dL   HCT 50.5 39.0 - 52.0 %   MCV 88.8 80.0 - 100.0 fL   MCH 29.5 26.0 - 34.0 pg   MCHC 33.3 30.0 - 36.0 g/dL   RDW 13.3 11.5 - 15.5 %   Platelets 202 150 - 400 K/uL   nRBC 0.0 0.0 - 0.2 %    Comment: Performed at [facility], [place] [address], [place], [place] [address]  Differential     Status: Abnormal   Collection Time: 10/02/20 10:17 PM  Result Value Ref Range   Neutrophils Relative % 91 %   Neutro Abs 8.1 (H) 1.7 - 7.7 K/uL   Lymphocytes Relative 5 %   Lymphs Abs 0.4 (L) 0.7 - 4.0 K/uL   Monocytes Relative 1 %   Monocytes Absolute 0.1 0.1 - 1.0 K/uL   Eosinophils Relative 1 %   Eosinophils Absolute 0.1 0.0 - 0.5 K/uL   Basophils Relative 1 %   Basophils Absolute 0.0 0.0 - 0.1 K/uL   Immature Granulocytes 1 %   Abs Immature Granulocytes 0.06 0.00 - 0.07 K/uL    Comment: Performed at [place] [address] [place],  [address]  Comprehensive metabolic panel     Status: Abnormal   Collection Time: 10/02/20 10:17 PM  Result Value Ref Range   Sodium  136 135 - 145 mmol/L   Potassium 3.9 3.5 - 5.1 mmol/L   Chloride 105 98 - 111 mmol/L    CO2 20 (L) 22 - 32 mmol/L   Glucose, Bld 154 (H) 70 - 99 mg/dL    Comment: Glucose reference range applies only to samples taken after fasting for at least 8 hours.   BUN 13 8 - 23 mg/dL   Creatinine, Ser 1.42 (H) 0.61 - 1.24 mg/dL   Calcium 8.9 8.9 - 10.3 mg/dL   Total Protein 7.3 6.5 - 8.1 g/dL   Albumin 3.8 3.5 - 5.0 g/dL   AST 26 15 - 41 U/L   ALT 34 0 - 44 U/L   Alkaline Phosphatase 99 38 - 126 U/L   Total Bilirubin 1.4 (H) 0.3 - 1.2 mg/dL   GFR, Estimated 53 (L) >60 mL/min    Comment: (NOTE) Calculated using the CKD-EPI Creatinine Equation (2021)    Anion gap 11 5 - 15    Comment: Performed at [place] [address] [place], [place] [address]  Ethanol     Status: None   Collection Time: 10/02/20 10:37 PM  Result Value Ref Range   Alcohol, Ethyl (B) <10 <10 mg/dL    Comment: (NOTE) Lowest detectable limit for serum alcohol is 10 mg/dL.  For medical purposes only. Performed at [facility], [place] [address] [place], [place] [address]   Lactic acid, plasma     Status: Abnormal   Collection Time: 10/02/20 10:37 PM  Result Value Ref Range   Lactic Acid, Venous 2.9 (HH) 0.5 - 1.9 mmol/L    Comment: CRITICAL RESULT CALLED TO, READ BACK BY AND VERIFIED WITH: BERRY C,RN 10/02/20 2332 WAYK Performed at [facility], [place] [address] [place], [place] [address]   Resp Panel by RT-PCR (Flu A&B, Covid) Nasopharyngeal Swab     Status: None   Collection Time: 10/02/20 10:40 PM   Specimen: Nasopharyngeal Swab; Nasopharyngeal(NP) swabs in vial transport medium  Result Value Ref Range   SARS Coronavirus 2 by RT PCR NEGATIVE NEGATIVE    Comment: (NOTE) SARS-CoV-2 target nucleic acids are NOT DETECTED.  The SARS-CoV-2 RNA is generally detectable in upper respiratory specimens during the acute phase of infection. The lowest concentration of SARS-CoV-2 viral copies this assay can detect is 138 copies/mL. A negative result does not preclude SARS-Cov-2 infection  and should not be used as the sole basis for treatment or other patient management decisions. A negative result may occur with  improper specimen collection/handling, submission of specimen other than nasopharyngeal swab, presence of viral mutation(s) within the areas targeted by this assay, and inadequate number of viral copies(<138 copies/mL). A negative result must be combined with clinical observations, patient history, and epidemiological information. The expected result is Negative.  Fact Sheet for Patients:  EntrepreneurPulse.com.au  Fact Sheet for Healthcare Providers:  IncredibleEmployment.be  This test is no t yet approved or cleared by the [place] FDA and  has been authorized for detection and/or diagnosis of SARS-CoV-2 by FDA under an Emergency Use Authorization (EUA). This EUA will remain  in effect (meaning this test can be used) for the duration of the COVID-19 declaration under Section 564(b)(1) of the Act, 21 U.S.C.section 360bbb-3(b)(1), unless the authorization is terminated  or revoked sooner.       Influenza A by PCR NEGATIVE NEGATIVE   Influenza B by PCR NEGATIVE NEGATIVE    Comment: (NOTE) The Xpert Xpress SARS-CoV-2/FLU/RSV plus assay  is intended as an aid in the diagnosis of influenza from Nasopharyngeal swab specimens and should not be used as a sole basis for treatment. Nasal washings and aspirates are unacceptable for Xpert Xpress SARS-CoV-2/FLU/RSV testing.  Fact Sheet for Patients: EntrepreneurPulse.com.au  Fact Sheet for Healthcare Providers: IncredibleEmployment.be  This test is not yet approved or cleared by the [place] FDA and has been authorized for detection and/or diagnosis of SARS-CoV-2 by FDA under an Emergency Use Authorization (EUA). This EUA will remain in effect (meaning this test can be used) for the duration of the COVID-19 declaration under  Section 564(b)(1) of the Act, 21 U.S.C. section 360bbb-3(b)(1), unless the authorization is terminated or revoked.  Performed at [facility], [place] [address], [place], [place] [address]   I-stat chem 8, ED     Status: Abnormal   Collection Time: 10/02/20 10:53 PM  Result Value Ref Range   Sodium 139 135 - 145 mmol/L   Potassium 3.9 3.5 - 5.1 mmol/L   Chloride 104 98 - 111 mmol/L   BUN 14 8 - 23 mg/dL   Creatinine, Ser 1.20 0.61 - 1.24 mg/dL   Glucose, Bld 155 (H) 70 - 99 mg/dL    Comment: Glucose reference range applies only to samples taken after fasting for at least 8 hours.   Calcium, Ion 1.13 (L) 1.15 - 1.40 mmol/L   TCO2 20 (L) 22 - 32 mmol/L   Hemoglobin 18.0 (H) 13.0 - 17.0 g/dL   HCT 53.0 (H) 39.0 - 52.0 %  Urinalysis, Routine w reflex microscopic Urine, Clean Catch     Status: None   Collection Time: 10/03/20  1:35 AM  Result Value Ref Range   Color, Urine YELLOW YELLOW   APPearance CLEAR CLEAR   Specific Gravity, Urine 1.017 1.005 - 1.030   pH 5.0 5.0 - 8.0   Glucose, UA NEGATIVE NEGATIVE mg/dL   Hgb urine dipstick NEGATIVE NEGATIVE   Bilirubin Urine NEGATIVE NEGATIVE   Ketones, ur NEGATIVE NEGATIVE mg/dL   Protein, ur NEGATIVE NEGATIVE mg/dL   Nitrite NEGATIVE NEGATIVE   Leukocytes,Ua NEGATIVE NEGATIVE    Comment: Performed at [facility], [place] [address], [place],  [address]  Urine rapid drug screen (hosp performed)     Status: None   Collection Time: 10/03/20  1:35 AM  Result Value Ref Range   Opiates NONE DETECTED NONE DETECTED   Cocaine NONE DETECTED NONE DETECTED   Benzodiazepines NONE DETECTED NONE DETECTED   Amphetamines NONE DETECTED NONE DETECTED   Tetrahydrocannabinol NONE DETECTED NONE DETECTED   Barbiturates NONE DETECTED NONE DETECTED    Comment: (NOTE) DRUG SCREEN FOR MEDICAL PURPOSES ONLY.  IF CONFIRMATION IS NEEDED FOR ANY PURPOSE, NOTIFY LAB WITHIN 5 DAYS.  LOWEST DETECTABLE LIMITS FOR URINE DRUG SCREEN Drug Class                      Cutoff (ng/mL) Amphetamine and metabolites    1000 Barbiturate and metabolites    200 Benzodiazepine                 354 Tricyclics and metabolites     300 Opiates and metabolites        300 Cocaine and metabolites        300 THC                            50 Performed at [facility]  Lab, [address] [place], [place] [address]   Lactic acid, plasma     Status: Abnormal   Collection Time: 10/03/20  2:25 AM  Result Value Ref Range   Lactic Acid, Venous 2.3 (HH) 0.5 - 1.9 mmol/L    Comment: CRITICAL VALUE NOTED.  VALUE IS CONSISTENT WITH PREVIOUSLY REPORTED AND CALLED VALUE. Performed at [facility], [place] [address] [place], [name] [address]   Ammonia     Status: None   Collection Time: 10/03/20  6:30 AM  Result Value Ref Range   Ammonia 17 9 - 35 umol/L    Comment: Performed at [facility], [place] [address], [place], [place] [address]  TSH     Status: None   Collection Time: 10/03/20  6:30 AM  Result Value Ref Range   TSH 0.858 0.350 - 4.500 uIU/mL    Comment: Performed by a 3rd Generation assay with a functional sensitivity of <=0.01 uIU/mL. Performed at [facility], [place] [address], [place], [place] [address]   Lactic acid, plasma     Status: Abnormal   Collection Time: 10/03/20  6:30 AM  Result Value Ref Range   Lactic Acid, Venous 2.0 (HH) 0.5 - 1.9 mmol/L    Comment: CRITICAL VALUE NOTED.  VALUE IS CONSISTENT WITH PREVIOUSLY REPORTED AND CALLED VALUE. Performed at [facility], [place] [address] [place], [place] [address]   Comprehensive metabolic panel     Status: Abnormal   Collection Time: 10/03/20  6:30 AM  Result Value Ref Range   Sodium 135 135 - 145 mmol/L   Potassium 4.1 3.5 - 5.1 mmol/L   Chloride 107 98 - 111 mmol/L   CO2 19 (L) 22 - 32 mmol/L   Glucose, Bld 159 (H) 70 - 99 mg/dL    Comment: Glucose reference range applies only to samples taken after fasting for at least 8 hours.   BUN 15 8 - 23 mg/dL    Creatinine, Ser 1.34 (H) 0.61 - 1.24 mg/dL   Calcium 8.0 (L) 8.9 - 10.3 mg/dL   Total Protein 6.3 (L) 6.5 - 8.1 g/dL   Albumin 3.2 (L) 3.5 - 5.0 g/dL   AST 20 15 - 41 U/L   ALT 28 0 - 44 U/L   Alkaline Phosphatase 73 38 - 126 U/L   Total Bilirubin 1.1 0.3 - 1.2 mg/dL   GFR, Estimated 57 (L) >60 mL/min    Comment: (NOTE) Calculated using the CKD-EPI Creatinine Equation (2021)    Anion gap 9 5 - 15    Comment: Performed at [facility], [place] [address] [place], [place] [address]  CBC with Differential/Platelet     Status: Abnormal   Collection Time: 10/03/20  6:30 AM  Result Value Ref Range   WBC 15.9 (H) 4.0 - 10.5 K/uL   RBC 4.97 4.22 - 5.81 MIL/uL   Hemoglobin 14.8 13.0 - 17.0 g/dL   HCT 44.8 39.0 - 52.0 %   MCV 90.1 80.0 - 100.0 fL   MCH 29.8 26.0 - 34.0 pg   MCHC 33.0 30.0 - 36.0 g/dL   RDW 13.6 11.5 - 15.5 %   Platelets 158 150 - 400 K/uL   nRBC 0.0 0.0 - 0.2 %   Neutrophils Relative % 90 %   Neutro Abs 14.4 (H) 1.7 - 7.7 K/uL   Lymphocytes Relative 4 %   Lymphs Abs 0.6 (L) 0.7 - 4.0 K/uL   Monocytes Relative 5 %  Monocytes Absolute 0.7 0.1 - 1.0 K/uL   Eosinophils Relative 0 %   Eosinophils Absolute 0.0 0.0 - 0.5 K/uL   Basophils Relative 0 %   Basophils Absolute 0.1 0.0 - 0.1 K/uL   Immature Granulocytes 1 %   Abs Immature Granulocytes 0.14 (H) 0.00 - 0.07 K/uL    Comment: Performed at [place] [address] [place], [place] [address]  LDL cholesterol, direct     Status: None (Preliminary result)   Collection Time: 10/03/20  6:30 AM  Result Value Ref Range   Direct LDL 90.0 0 - 99 mg/dL    Comment: Performed at [place] [address], [place], [place] [address]   MR ANGIO HEAD WO CONTRAST  Result Date: 10/03/2020 CLINICAL DATA:  Neuro deficit. EXAM: MRA HEAD WITHOUT CONTRAST TECHNIQUE: Angiographic images of the Circle of Willis were acquired using MRA technique without intravenous contrast. COMPARISON:  No pertinent prior exam.  FINDINGS: Anterior circulation: Atheromatous irregularity of the cavernous carotids and bilateral MCA/ACA branches. High-grade right A2 branch stenosis with flow gap. Negative for aneurysm or major branch occlusion. Posterior circulation: The vertebral and basilar arteries are smoothly contoured and diffusely patent. Mild vertebral tortuosity. Bilateral PCA atheromatous irregularity with high-grade left P2 segment stenosis causing flow gap. Anatomic variants: None significant Other: None. IMPRESSION: 1. No emergent finding. 2. Generalized intracranial atherosclerosis with high-grade ACA and left PCA narrowings. Electronically Signed   By: Monte Fantasia M.D.   On: 10/03/2020 07:36   MR BRAIN WO CONTRAST  Result Date: 10/03/2020 CLINICAL DATA:  Initial evaluation for acute delirium, aphasia. EXAM: MRI HEAD WITHOUT CONTRAST TECHNIQUE: Multiplanar, multiecho pulse sequences of the brain and surrounding structures were obtained without intravenous contrast. COMPARISON:  Previous CT from 10/02/2020 as well as previous MRI from 06/03/2017. FINDINGS: Brain: Cerebral volume within normal limits for age. Few scattered remote lacunar infarcts present about the left basal ganglia, right thalamus, and right pons. There is subtle linear diffusion abnormality involving the subcortical white matter of the anterior left frontal lobe (series 5, image 80) evidence of associated T2/FLAIR signal abnormality without discernible ADC correlate. Finding favored to reflect changes of subacute ischemia. No associated hemorrhage or mass effect. No other diffusion abnormality to suggest acute or subacute ischemia elsewhere within the brain. Gray-white matter differentiation maintained. No encephalomalacia to suggest chronic cortical infarction. No evidence for acute or chronic intracranial hemorrhage. 8 mm well-circumscribed lesion at the right foramen magnum is seen, stable from previous. No other mass lesion, midline shift or mass  effect. No hydrocephalus or extra-axial fluid collection. Pituitary gland suprasellar region within normal limits. Midline structures intact. Vascular: Major intracranial vascular flow voids are maintained. Skull and upper cervical spine: Craniocervical junction within normal limits. Degenerative spondylosis noted at C3-4 with mild spinal stenosis. Bone marrow signal intensity normal. No focal marrow replacing lesion. No scalp soft tissue abnormality. Sinuses/Orbits: Sequelae of prior bilateral ocular lens replacement. Scattered mucosal thickening noted within the ethmoidal air cells and maxillary sinuses. Right mastoid and middle ear effusion again noted. 8 mm retention cyst versus Tornwaldt cyst noted at the central nasopharynx. Other: Area of increased T2 signal intensity within the left parotid gland appears to demonstrate signal dropout on FLAIR sequence, consistent with fat. IMPRESSION: 1. Subtle linear diffusion abnormality involving the subcortical white matter of the anterior left frontal lobe, most consistent with evolving subacute ischemia. No associated hemorrhage or mass effect. 2. No other acute intracranial abnormality. Underlying chronic lacunar infarcts at  the left basal ganglia, right thalamus, and right pons. 3. 8 mm mass at the right foramen magnum, stable as compared to previous exams dating back to 2017, suggesting a benign lesion. Differential considerations include a benign enhancing foramen magnum lesion, nerve sheath tumor, or possibly meningioma. 4. Right mastoid and middle ear effusion. Correlation with physical exam for possible acute otomastoiditis recommended. Electronically Signed   By: Jeannine Boga M.D.   On: 10/03/2020 03:48   CT ABDOMEN PELVIS W CONTRAST  Result Date: 10/03/2020 CLINICAL DATA:  Acute abdominal pain EXAM: CT ABDOMEN AND PELVIS WITH CONTRAST TECHNIQUE: Multidetector CT imaging of the abdomen and pelvis was performed using the standard protocol following  bolus administration of intravenous contrast. CONTRAST:  24mL OMNIPAQUE IOHEXOL 300 MG/ML  SOLN COMPARISON:  Abdominal MRI 05/07/2020 FINDINGS: Lower chest: Prominent atheromatous plaque in the descending aorta. Coronary atherosclerosis. No acute finding Hepatobiliary: Hepatic steatosis.No evidence of biliary obstruction or stone. Pancreas: Unremarkable. Spleen: Unremarkable. Adrenals/Urinary Tract: Negative adrenals. Solid enhancing mass at the right renal pelvis measuring 3 cm, known from prior abdominal MRI. Tiny renal cortical cysts. No hydronephrosis. Unremarkable bladder. Stomach/Bowel: Thick walled distal appendix with adjacent fat stranding, 15 mm in outer wall diameter. The appendix is in expected location extending inferiorly from the cecum. A small bubble of extraluminal gas is present on coronal reformats. No bowel obstruction. Vascular/Lymphatic: No acute vascular abnormality. Prominent atheromatous plaque. No mass or adenopathy. Reproductive:No pathologic findings. Other: Shallow and fatty right inguinal hernia. Musculoskeletal: No acute abnormalities. Chronic L5 pars defects with L5-S1 anterolisthesis and biforaminal stenosis. L4-5 notable disc degeneration as well. Pending direct communication in [place]. IMPRESSION: 1. Acute appendicitis involving the tip. Tiny extraluminal gas bubble compatible with perforation. No abscess or generalized pneumoperitoneum. 2. 3 cm right renal mass/neoplasm which is known from abdominal MRI earlier this year. Electronically Signed   By: Monte Fantasia M.D.   On: 10/03/2020 07:15   DG Chest Portable 1 View  Result Date: 10/02/2020 CLINICAL DATA:  Altered mental status EXAM: PORTABLE CHEST 1 VIEW COMPARISON:  11/18/2010 FINDINGS: Heart and mediastinal contours are within normal limits. No focal opacities or effusions. No acute bony abnormality. IMPRESSION: No active disease. Electronically Signed   By: Rolm Baptise M.D.   On: 10/02/2020 23:01   CT HEAD CODE STROKE  WO CONTRAST  Result Date: 10/02/2020 CLINICAL DATA:  Code stroke. Initial evaluation for acute code stroke. EXAM: CT HEAD WITHOUT CONTRAST TECHNIQUE: Contiguous axial images were obtained from the base of the skull through the vertex without intravenous contrast. COMPARISON:  Prior MRI from 06/03/2017 FINDINGS: Brain: Generalized age-related cerebral atrophy with mild chronic small vessel ischemic disease. Small remote lacunar infarct at the right thalamus. No acute intracranial hemorrhage. No acute large vessel territory infarct. No mass lesion, midline shift or mass effect. No hydrocephalus or extra-axial fluid collection. Vascular: No convincing hyperdense vessel. Both MCAs are somewhat dense in appearance, although fairly symmetric in appearance. Scattered vascular calcifications noted within the carotid siphons. Skull: Scalp soft tissues and calvarium within normal limits. Sinuses/Orbits: Globes and orbital soft tissues demonstrate no acute finding. Mild mucosal thickening noted within the sphenoid ethmoidal and maxillary sinuses. Prominent right mastoid and middle ear effusion. Additional trace left mastoid effusion noted as well. Other: None. ASPECTS [facility] Stroke Program Early CT Score) - Ganglionic level infarction (caudate, lentiform nuclei, internal capsule, insula, M1-M3 cortex): 7 - Supraganglionic infarction (M4-M6 cortex): 3 Total score (0-10 with 10 being normal): 10 IMPRESSION: 1. No acute intracranial infarct or  other abnormality. 2. ASPECTS is 10. 3. Age-related cerebral atrophy with mild chronic small vessel ischemic disease, with small remote right thalamic lacunar infarct. 4. Right mastoid and middle ear effusion. Correlation with physical exam recommended. These results were communicated to Dr. Lorrin Goodell At 10:39 pmon 6/10/2022by text page via the [facility] messaging system. Electronically Signed   By: Jeannine Boga M.D.   On: 10/02/2020 22:47    Anti-infectives (From admission,  onward)    Start     Dose/Rate Route Frequency Ordered Stop   10/03/20 0845  metroNIDAZOLE (FLAGYL) IVPB 500 mg  Status:  Discontinued        500 mg 100 mL/hr over 60 Minutes Intravenous Every 8 hours 10/03/20 0746 10/03/20 0749   10/03/20 0845  piperacillin-tazobactam (ZOSYN) IVPB 3.375 g        3.375 g 12.5 mL/hr over 240 Minutes Intravenous Every 8 hours 10/03/20 0758     10/03/20 0700  cefTRIAXone (ROCEPHIN) 2 g in sodium chloride 0.9 % 100 mL IVPB  Status:  Discontinued        2 g 200 mL/hr over 30 Minutes Intravenous Every 24 hours 10/03/20 0641 10/03/20 0749   10/03/20 0615  doxycycline (VIBRA-TABS) tablet 100 mg  Status:  Discontinued        100 mg Oral Every 12 hours 10/03/20 0524 10/03/20 0641   10/02/20 2345  vancomycin (VANCOREADY) IVPB 2000 mg/400 mL        2,000 mg 200 mL/hr over 120 Minutes Intravenous  Once 10/02/20 2333 10/03/20 0240   10/02/20 2345  ceFEPIme (MAXIPIME) 2 g in sodium chloride 0.9 % 100 mL IVPB        2 g 200 mL/hr over 30 Minutes Intravenous  Once 10/02/20 2333 10/03/20 0034        Assessment/Plan HTN HLD DM Hx CVA 3 years ago on plavix (last dose 6/8) COPD Tobacco abuse  Acute appendicitis - Patient admitted last night, found to have acute appendicitis on CT scan this morning. He has had symptoms for about a day and a half. He has been off his plavix for 3 days. Reviewed with MD, recommend laparoscopic appendectomy. Keep NPO. IV zosyn. Plan for surgery later today.   ID - maxipime/vancomycin 6/10, zosyn 6/11>> VTE - SCDs, per primary FEN - IVF, NPO Foley - none Follow up - TBD   Wellington Hampshire, [facility] Surgery 10/03/2020, 8:11 AM Please see Amion for pager number during day hours 7:00am-4:30pm

## 2020-10-03 NOTE — Progress Notes (Signed)
  Echocardiogram 2D Echocardiogram has been performed.  Jonathan [name] 10/03/2020, 3:22 PM

## 2020-10-03 NOTE — Evaluation (Signed)
Clinical/Bedside Swallow Evaluation Patient Details  Name: Jonathan [name] MRN: [id number] Date of Birth: [birth year]  Today's Date: 10/03/2020 Time: SLP Start Time (ACUTE ONLY): 1357 SLP Stop Time (ACUTE ONLY): 1406 SLP Time Calculation (min) (ACUTE ONLY): 9 min  Past Medical History:  Past Medical History:  Diagnosis Date   Anxiety    Arthritis    COPD (chronic obstructive pulmonary disease) ([place])    Depression    Diabetes mellitus without complication ([place])    Diarrhea    History of kidney stones    HTN (hypertension)    Hyperlipidemia    Hypertensive retinopathy    OU   Pneumonia    Retinal detachment    Rheg. RD OD   Sleep apnea    does not use cpap   Stroke [facility])    denies any deficits   Past Surgical History:  Past Surgical History:  Procedure Laterality Date   CATARACT EXTRACTION Bilateral    Dr. Sherrilee Gilles     EYE SURGERY Bilateral    Cat Sx OU; RD repair OD   GAS INSERTION Right 03/13/2019   Procedure: INSERTION OF GAS (C3F8) RIGHT EYE;  Surgeon: [name], MD;  Location: [place];  Service: Ophthalmology;  Laterality: Right;   LASER PHOTO ABLATION Right 03/13/2019   Procedure: LASER PHOTO  ABLATION RIGHT EYE;  Surgeon: [name], MD;  Location: [place];  Service: Ophthalmology;  Laterality: Right;   none     PARS PLANA VITRECTOMY Right 03/13/2019   Procedure: 25 GAUGE PARS PLANA VITRECTOMY WITH  INTRAOCULAR LENSE EXPLANTATION RIGHT EYE.;  Surgeon: [name], MD;  Location: [place];  Service: Ophthalmology;  Laterality: Right;   PARS PLANA VITRECTOMY Right 06/27/2019   Procedure: PARS PLANA VITRECTOMY WITH 25 GAUGE;  Surgeon: [name], MD;  Location: [place];  Service: Ophthalmology;  Laterality: Right;   PHOTOCOAGULATION WITH LASER Left 03/13/2019   Procedure: INDIRECT PHOTOCOAGULATION WITH LASER LEFT EYE;  Surgeon: [name], MD;  Location: [place];  Service: Ophthalmology;  Laterality: Left;   PLACEMENT AND SUTURE OF SECONDARY INTRAOCULAR  LENS Right 06/27/2019   Procedure: PLACEMENT AND SUTURE OF SECONDARY INTRAOCULAR LENS;  Surgeon: [name], MD;  Location: [place];  Service: Ophthalmology;  Laterality: Right;   RETINAL DETACHMENT SURGERY Right 03/13/2019   PPV for repair of rheg. RD - Dr. [name]   HPI:  70 Y/O M with PMX of COPD, Anxiety, Fatty Liver, MDD, DM2, HTN, HLD, CVA presents initially with a change in his mental status as reported by his wife. He denies any headache, no cough or SOB, and is afebrile at home. He stated that his main concern is abdominal pain with no N/V and no change in his bowel habit. Ultimately diagnosed with appendicitis with micro perforation s/p appendectomy 6/11.   Assessment / Plan / Recommendation Clinical Impression  Patient presents with normal appearing oropharyngeal swallowing function with no indication of aspiration or penetration. Denies a h/o dysphagia. No f/u SLP services warranted at this time. Please re-consult if needed. SLP Visit Diagnosis: Dysphagia, unspecified (R13.10)    Aspiration Risk       Diet Recommendation Regular;Thin liquid   Liquid Administration via: Cup;Straw Medication Administration: Whole meds with liquid Supervision: Patient able to self feed Compensations: Slow rate;Small sips/bites Postural Changes: Seated upright at 90 degrees    Other  Recommendations Oral Care Recommendations: Oral care BID   Follow up Recommendations None        Swallow Study  General HPI: 70 Y/O M with PMX of COPD, Anxiety, Fatty Liver, MDD, DM2, HTN, HLD, CVA presents initially with a change in his mental status as reported by his wife. He denies any headache, no cough or SOB, and is afebrile at home. He stated that his main concern is abdominal pain with no N/V and no change in his bowel habit. Ultimately diagnosed with appendicitis with micro perforation s/p appendectomy 6/11. Type of Study: Bedside Swallow Evaluation Previous Swallow Assessment: none Diet Prior to  this Study: Regular;Thin liquids Temperature Spikes Noted: No Respiratory Status: Room air History of Recent Intubation:  (surgery only) Behavior/Cognition: Alert;Cooperative;Pleasant mood Oral Cavity Assessment: Within Functional Limits Oral Care Completed by SLP: No Oral Cavity - Dentition: Edentulous Vision: Functional for self-feeding Self-Feeding Abilities: Able to feed self Patient Positioning: Upright in bed Baseline Vocal Quality: Normal Volitional Cough: Strong Volitional Swallow: Able to elicit    Oral/Motor/Sensory Function Overall Oral Motor/Sensory Function: Within functional limits   Ice Chips Ice chips: Not tested   Thin Liquid Thin Liquid: Within functional limits Presentation: Cup;Self Fed;Straw    Nectar Thick Nectar Thick Liquid: Not tested   Honey Thick Honey Thick Liquid: Not tested   Puree Puree: Within functional limits Presentation: Self Fed;Spoon   Solid     Solid: Within functional limits Presentation: Spoon;Self Fed     [employer] MA, CCC-SLP  Brie Eppard Meryl 10/03/2020,2:09 PM

## 2020-10-03 NOTE — Progress Notes (Signed)
VASCULAR LAB    Attempted Carotid duplex, however, patient being taken to OR for emergent appendectomy.  Will attempt when patient is available and as schedule permits.     Varsha Knock, RVT 10/03/2020, 11:29 AM

## 2020-10-03 NOTE — Anesthesia Postprocedure Evaluation (Signed)
Anesthesia Post Note  Patient: Jonathan [name]  Procedure(s) Performed: APPENDECTOMY LAPAROSCOPIC (Abdomen)     Patient location during evaluation: PACU Anesthesia Type: General Level of consciousness: sedated, patient cooperative and oriented Pain management: pain level controlled Vital Signs Assessment: post-procedure vital signs reviewed and stable Respiratory status: spontaneous breathing, nonlabored ventilation and respiratory function stable Cardiovascular status: blood pressure returned to baseline and stable Postop Assessment: no apparent nausea or vomiting Anesthetic complications: no   No notable events documented.  Last Vitals:  Vitals:   10/03/20 1057 10/03/20 1112  BP: (!) 134/56 123/65  Pulse: (!) 57 63  Resp: 19 (!) 22  Temp:    SpO2: 96% 92%    Last Pain:  Vitals:   10/03/20 1112  TempSrc:   PainSc: Asleep                 [name],E. Kare Dado

## 2020-10-03 NOTE — Progress Notes (Signed)
FPTS Interim Progress Note  S: Pt laying in bed alert and speaking to his wife on the phone. Pt is feeling better compared to this morning when he was admitted. He no longer feels confused.   O: BP 130/63 (BP Location: Right Arm)   Pulse 60   Temp 99.1 F (37.3 C) (Oral)   Resp 16   Ht 5\' 6"  (1.676 m)   Wt 91.4 kg   SpO2 97%   BMI 32.51 kg/m    General: Alert, no acute distress, pleasant  Cardio: Normal S1 and S2, RRR, no r/m/g Pulm: CTAB, normal work of breathing Abdomen: Bowel sounds normal. Abdomen mildly distended, generalized tenderness, no guarding  Extremities: No peripheral edema.  Neuro: Cranial nerves grossly intact, ANO x 4  A/P: Plan per day time for management for appendicitis and CVA. RN to escalate any concerns overnight to FPTS night team.   Lattie Haw, MD 10/03/2020, 8:39 PM PGY-2, [place] [facility] Service pager [phone]

## 2020-10-03 NOTE — Progress Notes (Signed)
Spoke with Jerene Pitch with General Surgery regarding acute appendicitis with micro perforation. To see this AM.   Lyndee Hensen, DO PGY-2, [place] [facility] 10/03/2020 7:53 AM

## 2020-10-03 NOTE — Hospital Course (Addendum)
Jonathan [name] is a 70 y.o. male presenting with altered mental status, aphasia and code sepsis initiated . PMH is significant for COPD, HTN, hypertensive retinopathy, MDD, T2DM, HLD, right eye retinal detachment, renal mass, tobacco use, hx of stroke 2/2 cerebral A thrombosis, OSA   Sepsis from appendicitis with Micro-perforation  S/p appendectomy  Patient admitted with sepsis of unknown origin and started on Vancomycin and CTX. Patient was then found to have appendicitis with micro perforation on  CT abd/pelvis with elevated lactate. Surgery was consulted and antibiotics were transitioned to Vanc and Zosyn and patient underwent appendectomy on 6/11.  Continued on Zosyn while inpatient and then changed to Augmentin at discharge.  Subacute CVA Patient initially presented with mental status change and code stroke was called. Neurology was consulted and CT head did not have any acute findings and no deficits on exam. MRI was obtained and showed evidence of subacute ischemia and known 24mm mass. Patient was placed on DAPT with ASA and Clopidogrel, to be continued for 3 months followed by Plavix alone.  Hypertension Patient initially admitted with normotensive BP so home medications were held, especially given sepsis and after discovery of stroke on MRI. Blood pressures began to increase after permissive hypertension period and patient was given one dose of hydralazine and restarted on home meds.  Right mastoid and middle ear effusion MR brain had findings concerning for possible acute otomastoiditis with right mastoid and middle ear effusion. Patient without complaints but had thick yellow effusion. Infection treatment was covered by sepsis/appendicitis treatment.   Type 2 DM Patient home medications were held and patient was placed on sliding scale insulin for glucose control while hospitalized.   Renal mass MR liver on 05/07/20 showed solid enhancing endophytic lesion in right renal pelvis ~2.8cm.  Will need outpatient follow-up.

## 2020-10-03 NOTE — Progress Notes (Signed)
Pharmacy Antibiotic Note  Jonathan [name] is a 70 y.o. male admitted on 10/02/2020 with sepsis and new concern for acute appendicitis and intra-abdominal infection .  Pharmacy has been consulted for Zosyn dosing. Patient is afebrile (Tmax 101.3), WBC 15.9, LA 2.0, Scr 1.34.   Plan: Zosyn 3.375g IV q8h (4 hour infusion). Monitor renal function, clinical status, cx results  Height: 5\' 6"  (167.6 cm) Weight: 91.4 kg (201 lb 6.4 oz) IBW/kg (Calculated) : 63.8  Temp (24hrs), Avg:98.9 F (37.2 C), Min:98 F (36.7 C), Max:101.3 F (38.5 C)  Recent Labs  Lab 10/02/20 2217 10/02/20 2237 10/02/20 2253 10/03/20 0225 10/03/20 0630  WBC 8.8  --   --   --  15.9*  CREATININE 1.42*  --  1.20  --  1.34*  LATICACIDVEN  --  2.9*  --  2.3* 2.0*    Estimated Creatinine Clearance: 55 mL/min (A) (by C-G formula based on SCr of 1.34 mg/dL (H)).    No Known Allergies  Antimicrobials this admission: Cefepime 6/10 x1 Vancomycin 6/11 x1 Zosyn 6/11>  Dose adjustments this admission: N/A  Microbiology results: Ucx 6/11: Bcx 6/11:  Thank you for allowing pharmacy to be a part of this patient's care.  Romilda Garret, PharmD PGY1 Acute Care Pharmacy Resident 10/03/2020 7:59 AM  Please check [email or web address] for unit specific pharmacy phone numbers.

## 2020-10-03 NOTE — Progress Notes (Signed)
SLP Cancellation Note  Patient Details Name: Jonathan [name] MRN: [id number] DOB: [birth year]   Cancelled treatment:       Reason Eval/Treat Not Completed: Patient at procedure or test/unavailable (In surgery.)   Shacoria Latif Meryl 10/03/2020, 9:36 AM

## 2020-10-03 NOTE — Progress Notes (Addendum)
Family Medicine Teaching Service Daily Progress Note Intern Pager: [phone]  Patient name: Jonathan [name] Medical record number: [id number] Date of birth: 1950/06/12 Age: 70 y.o. Gender: male  Primary Care Provider: Leeanne Rio, MD Consultants: neurology, general surgery  Code Status: FULL  Pt Overview and Major Events to Date:  Jonathan [name] is a 70 y.o. male presenting with altered mental status, aphasia and code sepsis initiated . PMH is significant for COPD, HTN, hypertensive retinopathy, MDD, T2DM, HLD, right eye retinal detachment, renal mass, tobacco use, hx of stroke 2/2 cerebral A thrombosis, OSA  Assessment and Plan: Jonathan [name] is a 70 y.o. male presenting with altered mental status, aphasia and code sepsis initiated . PMH is significant for COPD, HTN, hypertensive retinopathy, MDD, T2DM, HLD, right eye retinal detachment, renal mass, tobacco use, hx of stroke 2/2 cerebral A thrombosis, OSA  Sepsis due to likely Intra-abdominal Infection  Appendicitis with Mirco-Perforation  Pt admitted overnight found to have acute appendicitis with micro perforation on CT ABD/Pelvis. Pt initially febrile with increasing leukocytosis. BP stable. LA elevated. Consulted general surgery who will evaluate patient this morning. Will keep NPO for now. Hold plavix and VTE ppx. Start Zosyn and continue Vanc. Await additional recommendations  - General surgery consulted, appreciate recommendations  - Vancomycin (6/11 -) - Discontinue CTX (6/11) - Start Zosyn (6/11-) - NPO - AM CMP, CBC  - Follow up BCx, UCx  Subacute CVA  Pt with negative acute CT Head for CVA however MRI Brain with evidence of subacute ischemia. Known 8 mm mass    HTN  Normotensive. Home BP meds held. See H&P for plan.   T2DM -sSSI and monitoring CBGs   HLD Held atorvastatin.   Incidental Finding: Right Mastoid and Middle Ear Effusion MR Brain concerning for possible acute otomastoiditis showing right mastoid  and middle ear effusion. Patient denying pain. No tenderness on exam. No change in hearing or tinnitus. Pt with yellow thick effusion.  - Antibiotics as above  - Monitor sx   Elevated Troponin  Pt denied chest pain on admission. Initial trop 32. - AM EKG  - Follow up repeat troponin    FEN/GI: NPO pending surgical evaluation  PPx: Holding Lovenox in setting of acute appy   Disposition: progressive for sepsis   Subjective:  Pt admitted overnight found to have acute appendicitis.   Objective: Temp:  [97.8 F (36.6 C)-101.3 F (38.5 C)] 97.8 F (36.6 C) (06/11 0828) Pulse Rate:  [60-107] 60 (06/11 0828) Resp:  [18-25] 20 (06/11 0828) BP: (122-161)/(57-81) 161/81 (06/11 0828) SpO2:  [93 %-95 %] 95 % (06/11 0828) Weight:  [91.4 kg-94.3 kg] 91.4 kg (06/11 0240)   Physical Exam: Pt in surgical suite. Will reassess after procedure.   Laboratory: Recent Labs  Lab 10/02/20 2217 10/02/20 2253 10/03/20 0630  WBC 8.8  --  15.9*  HGB 16.8 18.0* 14.8  HCT 50.5 53.0* 44.8  PLT 202  --  158   Recent Labs  Lab 10/02/20 2217 10/02/20 2253 10/03/20 0630  NA 136 139 135  K 3.9 3.9 4.1  CL 105 104 107  CO2 20*  --  19*  BUN 13 14 15   CREATININE 1.42* 1.20 1.34*  CALCIUM 8.9  --  8.0*  PROT 7.3  --  6.3*  BILITOT 1.4*  --  1.1  ALKPHOS 99  --  73  ALT 34  --  28  AST 26  --  20  GLUCOSE 154* 155* 159*  Troponin: 32, repeat pending   Imaging/Diagnostic Tests: MR ANGIO HEAD WO CONTRAST  Result Date: 10/03/2020 CLINICAL DATA:  Neuro deficit. EXAM: MRA HEAD WITHOUT CONTRAST TECHNIQUE: Angiographic images of the Circle of Willis were acquired using MRA technique without intravenous contrast. COMPARISON:  No pertinent prior exam. FINDINGS: Anterior circulation: Atheromatous irregularity of the cavernous carotids and bilateral MCA/ACA branches. High-grade right A2 branch stenosis with flow gap. Negative for aneurysm or major branch occlusion. Posterior circulation: The  vertebral and basilar arteries are smoothly contoured and diffusely patent. Mild vertebral tortuosity. Bilateral PCA atheromatous irregularity with high-grade left P2 segment stenosis causing flow gap. Anatomic variants: None significant Other: None. IMPRESSION: 1. No emergent finding. 2. Generalized intracranial atherosclerosis with high-grade ACA and left PCA narrowings. Electronically Signed   By: Monte Fantasia M.D.   On: 10/03/2020 07:36   MR BRAIN WO CONTRAST  Result Date: 10/03/2020 CLINICAL DATA:  Initial evaluation for acute delirium, aphasia. EXAM: MRI HEAD WITHOUT CONTRAST TECHNIQUE: Multiplanar, multiecho pulse sequences of the brain and surrounding structures were obtained without intravenous contrast. COMPARISON:  Previous CT from 10/02/2020 as well as previous MRI from 06/03/2017. FINDINGS: Brain: Cerebral volume within normal limits for age. Few scattered remote lacunar infarcts present about the left basal ganglia, right thalamus, and right pons. There is subtle linear diffusion abnormality involving the subcortical white matter of the anterior left frontal lobe (series 5, image 80) evidence of associated T2/FLAIR signal abnormality without discernible ADC correlate. Finding favored to reflect changes of subacute ischemia. No associated hemorrhage or mass effect. No other diffusion abnormality to suggest acute or subacute ischemia elsewhere within the brain. Gray-white matter differentiation maintained. No encephalomalacia to suggest chronic cortical infarction. No evidence for acute or chronic intracranial hemorrhage. 8 mm well-circumscribed lesion at the right foramen magnum is seen, stable from previous. No other mass lesion, midline shift or mass effect. No hydrocephalus or extra-axial fluid collection. Pituitary gland suprasellar region within normal limits. Midline structures intact. Vascular: Major intracranial vascular flow voids are maintained. Skull and upper cervical spine:  Craniocervical junction within normal limits. Degenerative spondylosis noted at C3-4 with mild spinal stenosis. Bone marrow signal intensity normal. No focal marrow replacing lesion. No scalp soft tissue abnormality. Sinuses/Orbits: Sequelae of prior bilateral ocular lens replacement. Scattered mucosal thickening noted within the ethmoidal air cells and maxillary sinuses. Right mastoid and middle ear effusion again noted. 8 mm retention cyst versus Tornwaldt cyst noted at the central nasopharynx. Other: Area of increased T2 signal intensity within the left parotid gland appears to demonstrate signal dropout on FLAIR sequence, consistent with fat. IMPRESSION: 1. Subtle linear diffusion abnormality involving the subcortical white matter of the anterior left frontal lobe, most consistent with evolving subacute ischemia. No associated hemorrhage or mass effect. 2. No other acute intracranial abnormality. Underlying chronic lacunar infarcts at the left basal ganglia, right thalamus, and right pons. 3. 8 mm mass at the right foramen magnum, stable as compared to previous exams dating back to 2017, suggesting a benign lesion. Differential considerations include a benign enhancing foramen magnum lesion, nerve sheath tumor, or possibly meningioma. 4. Right mastoid and middle ear effusion. Correlation with physical exam for possible acute otomastoiditis recommended. Electronically Signed   By: Jeannine Boga M.D.   On: 10/03/2020 03:48   CT ABDOMEN PELVIS W CONTRAST  Result Date: 10/03/2020 CLINICAL DATA:  Acute abdominal pain EXAM: CT ABDOMEN AND PELVIS WITH CONTRAST TECHNIQUE: Multidetector CT imaging of the abdomen and pelvis was performed using the standard protocol  following bolus administration of intravenous contrast. CONTRAST:  48mL OMNIPAQUE IOHEXOL 300 MG/ML  SOLN COMPARISON:  Abdominal MRI 05/07/2020 FINDINGS: Lower chest: Prominent atheromatous plaque in the descending aorta. Coronary atherosclerosis. No  acute finding Hepatobiliary: Hepatic steatosis.No evidence of biliary obstruction or stone. Pancreas: Unremarkable. Spleen: Unremarkable. Adrenals/Urinary Tract: Negative adrenals. Solid enhancing mass at the right renal pelvis measuring 3 cm, known from prior abdominal MRI. Tiny renal cortical cysts. No hydronephrosis. Unremarkable bladder. Stomach/Bowel: Thick walled distal appendix with adjacent fat stranding, 15 mm in outer wall diameter. The appendix is in expected location extending inferiorly from the cecum. A small bubble of extraluminal gas is present on coronal reformats. No bowel obstruction. Vascular/Lymphatic: No acute vascular abnormality. Prominent atheromatous plaque. No mass or adenopathy. Reproductive:No pathologic findings. Other: Shallow and fatty right inguinal hernia. Musculoskeletal: No acute abnormalities. Chronic L5 pars defects with L5-S1 anterolisthesis and biforaminal stenosis. L4-5 notable disc degeneration as well. Pending direct communication in [place]. IMPRESSION: 1. Acute appendicitis involving the tip. Tiny extraluminal gas bubble compatible with perforation. No abscess or generalized pneumoperitoneum. 2. 3 cm right renal mass/neoplasm which is known from abdominal MRI earlier this year. Electronically Signed   By: Monte Fantasia M.D.   On: 10/03/2020 07:15   DG Chest Portable 1 View  Result Date: 10/02/2020 CLINICAL DATA:  Altered mental status EXAM: PORTABLE CHEST 1 VIEW COMPARISON:  11/18/2010 FINDINGS: Heart and mediastinal contours are within normal limits. No focal opacities or effusions. No acute bony abnormality. IMPRESSION: No active disease. Electronically Signed   By: Rolm Baptise M.D.   On: 10/02/2020 23:01   CT HEAD CODE STROKE WO CONTRAST  Result Date: 10/02/2020 CLINICAL DATA:  Code stroke. Initial evaluation for acute code stroke. EXAM: CT HEAD WITHOUT CONTRAST TECHNIQUE: Contiguous axial images were obtained from the base of the skull through the vertex  without intravenous contrast. COMPARISON:  Prior MRI from 06/03/2017 FINDINGS: Brain: Generalized age-related cerebral atrophy with mild chronic small vessel ischemic disease. Small remote lacunar infarct at the right thalamus. No acute intracranial hemorrhage. No acute large vessel territory infarct. No mass lesion, midline shift or mass effect. No hydrocephalus or extra-axial fluid collection. Vascular: No convincing hyperdense vessel. Both MCAs are somewhat dense in appearance, although fairly symmetric in appearance. Scattered vascular calcifications noted within the carotid siphons. Skull: Scalp soft tissues and calvarium within normal limits. Sinuses/Orbits: Globes and orbital soft tissues demonstrate no acute finding. Mild mucosal thickening noted within the sphenoid ethmoidal and maxillary sinuses. Prominent right mastoid and middle ear effusion. Additional trace left mastoid effusion noted as well. Other: None. ASPECTS [facility] Stroke Program Early CT Score) - Ganglionic level infarction (caudate, lentiform nuclei, internal capsule, insula, M1-M3 cortex): 7 - Supraganglionic infarction (M4-M6 cortex): 3 Total score (0-10 with 10 being normal): 10 IMPRESSION: 1. No acute intracranial infarct or other abnormality. 2. ASPECTS is 10. 3. Age-related cerebral atrophy with mild chronic small vessel ischemic disease, with small remote right thalamic lacunar infarct. 4. Right mastoid and middle ear effusion. Correlation with physical exam recommended. These results were communicated to Dr. Lorrin Goodell At 10:39 pmon 6/10/2022by text page via the [facility] messaging system. Electronically Signed   By: Jeannine Boga M.D.   On: 10/02/2020 22:47     Lyndee Hensen, DO 10/03/2020, 9:04 AM PGY-2, [place] Intern pager: [phone], text pages welcome

## 2020-10-03 NOTE — Op Note (Signed)
10/03/2020  10:21 AM  PATIENT:  Jonathan [name]  70 y.o. male  PRE-OPERATIVE DIAGNOSIS:  appendisitis  POST-OPERATIVE DIAGNOSIS:  appendicitis  PROCEDURE:  Procedure(s): APPENDECTOMY LAPAROSCOPIC (N/A)  SURGEON:  Surgeon(s) and Role:    * Ralene Ok, MD - Primary  ANESTHESIA:   local and general  EBL:  minimal   BLOOD ADMINISTERED:none  DRAINS: none   LOCAL MEDICATIONS USED:  BUPIVICAINE   SPECIMEN:  Source of Specimen:  appendix  DISPOSITION OF SPECIMEN:  PATHOLOGY  COUNTS:  YES  TOURNIQUET:  * No tourniquets in log *  DICTATION: .Dragon Dictation  Complications: none  Counts: reported as correct x 2  Findings:  The patient had an acutely inflamed perforated appendix  Specimen: Appendix  Indications for procedure:  The patient is a 70 year old male with a history of periumbilical pain localized in the right lower quadrant patient had a CT scan which revealed signs consistent with acute appendicitis the patient back in for laparoscopic appendectomy.  Details of the procedure:The patient was taken back to the operating room. The patient was placed in supine position with bilateral SCDs in place.  A foley catheter was place. The patient was prepped and draped in the usual sterile fashion.  After appropriate anitbiotics were confirmed, a time-out was confirmed and all facts were verified.    A pneumoperitoneum of 14 mmHg was obtained via a Veress needle technique in the left lower quadrant quadrant.  A 5 mm trocar and 5 mm camera then placed intra-abdominally there is no injury to any intra-abdominal organs a 10 mm infraumbilical port was placed and direct visualization as was a 5 mm port in the suprapubic area.   The appendix was identified and seen to be perforated.  There was some purulence in the right paracolic gutter.  The appendix was cleaned down to the appendiceal base. The mesoappendix was then incised and the appendiceal artery was cauterized.  The the  appendiceal base was clean.  A gold hemoclip was placed proximallyx2 and one distally and the appendix was transected between these 2. A retrieval bag was then placed into the abdomen and the specimen placed in the bag. The appendiceal stump was cauterized. We evacuate the fluid from the pelvis until the effluent was clear.  The appendix and retrieval  bag was then retrieved via the supraumbilical port. #1 Vicryl was used to reapproximate the fascia at the umbilical port site x2. The skin was reapproximated all port sites 3-0 Monocryl subcuticular fashion. The skin was dressed with Dermabond.  The patient had the foley removed. The patient was awakened from general anesthesia was taken to recovery room in stable condition.     PLAN OF CARE: Admit for overnight observation  PATIENT DISPOSITION:  PACU - hemodynamically stable.   Delay start of Pharmacological VTE agent (>24hrs) due to surgical blood loss or risk of bleeding: not applicable

## 2020-10-03 NOTE — Anesthesia Preprocedure Evaluation (Signed)
Anesthesia Evaluation  Patient identified by MRN, date of birth, ID band Patient awake    Reviewed: Allergy & Precautions, NPO status , Patient's Chart, lab work & pertinent test results  History of Anesthesia Complications Negative for: history of anesthetic complications  Airway Mallampati: I  TM Distance: >3 FB Neck ROM: Full    Dental  (+) Edentulous Upper, Edentulous Lower   Pulmonary sleep apnea (does not use CPAP) , COPD,  COPD inhaler, Current Smoker and Patient abstained from smoking.,    breath sounds clear to auscultation       Cardiovascular hypertension (pt non-compliant with BP meds),  Rhythm:Regular Rate:Normal     Neuro/Psych Anxiety Depression CVA, No Residual Symptoms    GI/Hepatic Neg liver ROS, N/V with acute appy   Endo/Other  diabetes (glu 188), Oral Hypoglycemic AgentsMorbid obesity  Renal/GU Renal InsufficiencyRenal disease     Musculoskeletal  (+) Arthritis ,   Abdominal (+) + obese,   Peds  Hematology Pt non-compliant with Plavix   Anesthesia Other Findings   Reproductive/Obstetrics                             Anesthesia Physical Anesthesia Plan  ASA: 3  Anesthesia Plan: General   Post-op Pain Management:    Induction: Intravenous and Rapid sequence  PONV Risk Score and Plan: 1 and Ondansetron and Dexamethasone  Airway Management Planned: Oral ETT  Additional Equipment: None  Intra-op Plan:   Post-operative Plan: Extubation in OR  Informed Consent: I have reviewed the patients History and Physical, chart, labs and discussed the procedure including the risks, benefits and alternatives for the proposed anesthesia with the patient or authorized representative who has indicated his/her understanding and acceptance.       Plan Discussed with: CRNA and Surgeon  Anesthesia Plan Comments:         Anesthesia Quick Evaluation

## 2020-10-03 NOTE — Sepsis Progress Note (Signed)
Notified bedside nurse of need to draw repeat lactic acid and blood cultures.

## 2020-10-03 NOTE — Sepsis Progress Note (Signed)
Notified provider of need to order repeat lactic acid. ° °

## 2020-10-04 ENCOUNTER — Encounter (HOSPITAL_COMMUNITY): Payer: Self-pay | Admitting: General Surgery

## 2020-10-04 ENCOUNTER — Inpatient Hospital Stay (HOSPITAL_COMMUNITY): Payer: Medicare Other

## 2020-10-04 DIAGNOSIS — E782 Mixed hyperlipidemia: Secondary | ICD-10-CM

## 2020-10-04 DIAGNOSIS — I63412 Cerebral infarction due to embolism of left middle cerebral artery: Secondary | ICD-10-CM

## 2020-10-04 DIAGNOSIS — R4182 Altered mental status, unspecified: Secondary | ICD-10-CM

## 2020-10-04 LAB — URINE CULTURE: Culture: NO GROWTH

## 2020-10-04 LAB — CBC
HCT: 39.8 % (ref 39.0–52.0)
Hemoglobin: 13.2 g/dL (ref 13.0–17.0)
MCH: 29.9 pg (ref 26.0–34.0)
MCHC: 33.2 g/dL (ref 30.0–36.0)
MCV: 90.2 fL (ref 80.0–100.0)
Platelets: 169 10*3/uL (ref 150–400)
RBC: 4.41 MIL/uL (ref 4.22–5.81)
RDW: 13.7 % (ref 11.5–15.5)
WBC: 16.8 10*3/uL — ABNORMAL HIGH (ref 4.0–10.5)
nRBC: 0 % (ref 0.0–0.2)

## 2020-10-04 LAB — COMPREHENSIVE METABOLIC PANEL
ALT: 23 U/L (ref 0–44)
AST: 17 U/L (ref 15–41)
Albumin: 2.7 g/dL — ABNORMAL LOW (ref 3.5–5.0)
Alkaline Phosphatase: 66 U/L (ref 38–126)
Anion gap: 7 (ref 5–15)
BUN: 22 mg/dL (ref 8–23)
CO2: 21 mmol/L — ABNORMAL LOW (ref 22–32)
Calcium: 8 mg/dL — ABNORMAL LOW (ref 8.9–10.3)
Chloride: 108 mmol/L (ref 98–111)
Creatinine, Ser: 1.61 mg/dL — ABNORMAL HIGH (ref 0.61–1.24)
GFR, Estimated: 46 mL/min — ABNORMAL LOW (ref >60)
Glucose, Bld: 177 mg/dL — ABNORMAL HIGH (ref 70–99)
Potassium: 4.1 mmol/L (ref 3.5–5.1)
Sodium: 136 mmol/L (ref 135–145)
Total Bilirubin: 0.5 mg/dL (ref 0.3–1.2)
Total Protein: 6.1 g/dL — ABNORMAL LOW (ref 6.5–8.1)

## 2020-10-04 LAB — GLUCOSE, CAPILLARY
Glucose-Capillary: 157 mg/dL — ABNORMAL HIGH (ref 70–99)
Glucose-Capillary: 130 mg/dL — ABNORMAL HIGH (ref 70–99)
Glucose-Capillary: 143 mg/dL — ABNORMAL HIGH (ref 70–99)
Glucose-Capillary: 97 mg/dL (ref 70–99)

## 2020-10-04 LAB — PROCALCITONIN: Procalcitonin: 32.5 ng/mL

## 2020-10-04 MED ORDER — OXYCODONE HCL 5 MG PO TABS: 5.0000 mg | ORAL_TABLET | ORAL | Status: DC | PRN

## 2020-10-04 MED ORDER — ACETAMINOPHEN 325 MG PO TABS: 650.0000 mg | ORAL_TABLET | Freq: Four times a day (QID) | ORAL | Status: DC | PRN

## 2020-10-04 MED ORDER — ASPIRIN EC 81 MG PO TBEC: 81.0000 mg | DELAYED_RELEASE_TABLET | Freq: Every day | ORAL | Status: DC

## 2020-10-04 MED ORDER — POLYETHYLENE GLYCOL 3350 17 G PO PACK: 17.0000 g | PACK | Freq: Every day | ORAL | Status: DC | PRN

## 2020-10-04 MED ORDER — OXYCODONE HCL 5 MG PO TABS: 5.0000 mg | ORAL_TABLET | Freq: Four times a day (QID) | ORAL | 0 refills | Status: DC | PRN

## 2020-10-04 MED ORDER — CLOPIDOGREL BISULFATE 75 MG PO TABS
75.0000 mg | ORAL_TABLET | Freq: Every day | ORAL | Status: DC
  Administered 2020-10-04 – 2020-10-05 (×2): 75 mg via ORAL
  Filled 2020-10-04 (×2): qty 1

## 2020-10-04 NOTE — Progress Notes (Signed)
VASCULAR LAB    Carotid duplex has been performed.  See CV proc for preliminary results.   Kyliah Deanda, RVT 10/04/2020, 6:30 PM

## 2020-10-04 NOTE — Progress Notes (Addendum)
Neurology Progress Note  IDENTIFYING INFORMATION  Jonathan [name] MR# [id number] 10/04/2020  HISTORY OF PRESENT ILLNESS  Jonathan [name] is a 70 y.o. male who  has a past medical history of Anxiety, Arthritis, COPD (chronic obstructive pulmonary disease) ([place]), Depression, Diabetes mellitus without complication ([place]), Diarrhea, History of kidney stones, HTN (hypertension), Hyperlipidemia, Hypertensive retinopathy, Pneumonia, Retinal detachment, Sleep apnea, and Stroke ([place]). with acute confusion with aphasia that started abruptly, found to be septic with ruptured appendix s/p emergent surgical intervention. His exam did not show focal neurologic deficits and was not a candidate for tPA. Subsequent imaging revealed stroke.    INTERVAL HISTORY   He was also found to have appendicitis with rupture and underwent surgical procedure yesterday. Today, he is sitting up in the chair eating and states he is back to his baseline. He said his wife noticed that he was not making sense however e does not recall all the details.  MEDICATIONS    Current Facility-Administered Medications (Endocrine & Metabolic):    insulin aspart (novoLOG) injection 0-9 Units   Current Facility-Administered Medications (Cardiovascular):    atorvastatin (LIPITOR) tablet 80 mg   Current Facility-Administered Medications (Respiratory):    albuterol (PROVENTIL) (2.5 MG/3ML) 0.083% nebulizer solution 2.5 mg   Current Facility-Administered Medications (Analgesics):    acetaminophen (TYLENOL) tablet 650 mg   oxyCODONE (Oxy IR/ROXICODONE) immediate release tablet 5 mg  Current Outpatient Medications (Analgesics):    oxyCODONE (OXY IR/ROXICODONE) 5 MG immediate release tablet, Take 1 tablet (5 mg total) by mouth every 6 (six) hours as needed for severe pain.    Current Facility-Administered Medications (Other):    0.9 %  sodium chloride infusion   piperacillin-tazobactam (ZOSYN) IVPB 3.375 g   polyethylene glycol  (MIRALAX / GLYCOLAX) packet 17 g   VITAL SIGNS  Temp:  [97.7 F (36.5 C)-99.1 F (37.3 C)] 98.5 F (36.9 C) (06/12 0408) Pulse Rate:  [49-66] 52 (06/12 0408) Resp:  [16-22] 16 (06/12 0408) BP: (114-203)/(56-78) 144/74 (06/12 0408) SpO2:  [87 %-99 %] 95 % (06/12 0408) Weight:  [92.5 kg] 92.5 kg (06/12 0422)  PHYSICAL EXAM   General Physical Exam  General: NAD, lying comfortably in bed HENT: Normal oropharynx and mucosa. Normal external appearance of ears and nose. CV/Chest: No JVD, normal S1S2 Lungs: No audible wheezing. Normal work of breathing. No accessory muscle use Abdomen: Non distended, non tender Extremities: Warm and well perfused. No appreciable edema, cyanosis or deformity. Skin: No rash. Normal palpation of skin.   Musculoskeletal: No joint tenderness. Normal digits and nails by inspection. No clubbing.  Neurologic Examination  Mental status/Cognition: alert; oriented to month and age; good attention; no apparent neglect Speech/language: fluent; comprehension intact; object naming intact; repetition intact Visual Fields are full. Pupils are equal, round, and reactive to light. EOMI without ptosis or diploplia.  Facial sensation is symmetric to temperature Facial movement is symmetric.  Hearing is intact to voice. Uvula midline and palate elevates symmetrically. Shoulder shrug is symmetric. Tongue is midline without atrophy or fasciculations.  Tone is normal. Bulk is normal. 5/5 strength was present in all four extremities. Sensation is symmetric to light touch and temperature in the arms and legs. Deep Tendon Reflexes: 2+ and symmetric in the biceps and patellae. Toes are downgoing bilaterally. FNF and HKS are intact bilaterally. Rapid alternating movements are normal without dysdiadochokinesia.  IMAGING/DIAGNOSTIC STUDIES   MRI brain showed subtle linear diffusion abnormality involving the subcortical white matter of the anterior left frontal lobe, most  consistent  with evolving subacute ischemia. No associated hemorrhage or mass effect. No other acute intracranial abnormality. Underlying chronic lacunar infarcts at the left basal ganglia, right thalamus, and right pons. MRA brain showed generalized intracranial atherosclerosis with high-grade ACA and left PCA narrowings.    Lab Results  Component Value Date   HGBA1C 7.5 (A) 03/26/2020   Lab Results  Component Value Date   LDLCALC 134 (H) 06/11/2019     ASSESSMENT AND PLAN  [name] is a 70 y.o. male Hx depression, COPD, HTNive retinopathy, DM2, HLD, OSA, renall mass who presented with abdominal pain x1 day and acute confusion with aphasia found to have appendicitis with perforation s/p emergent surgery. He did not have focal deficits on initial evaluation and was not a candidate for intervention however subsequent imaging showed left frontal subcortical stroke.  Stroke: Left frontal subcortical infarction embolic likely subacute due to ADC pseudonormalization with persisting high DWI/FLAIR signal.  CT head: No acute intracranial infarct or other abnormality. ASPECTS 10.  MRI head Subtle linear diffusion abnormality involving the subcortical white matter of the anterior left frontal lobe, most consistent with evolving subacute ischemia. No associated hemorrhage or mass effect. No other acute intracranial abnormality. Underlying chronic lacunar infarcts at the left basal ganglia, right thalamus, and right pons.  MRA head No emergent finding. Generalized intracranial atherosclerosis with high-grade ACA and left PCA narrowings.   Carotid Doppler  - Pending   2D Echo - EF 55-60%, no wall motion abnormalities, LVT, IAS.  Goal BP: <130/90 in patient with long standing uncontrolled hyperglycemia. LDL: 134 goal <70 - hlong standing uncontrolled hyperglycemia. A1C: 7.5 goal <7 uncontrolled hyperglycemia. Antiplatelet therapy: Aspirin 81mg  and clopidogrel 75mg  for 3 weeks then stop  clopidogrel.  Patient has been started on Mechanical (SCD's) and Pharmacological (SQ heparin/Lovenox) DVT prophylaxis.  NIHSS on admission 0 Dysphagia screening ordered, and will be completed prior to patient receiving oral intake. Stroke education booklet has been ordered and will be provided by the RN that includes both written and verbal education to the patient and family regarding ischemic strokes. We have reviewed the patient's personal modifiable risk factors. Patient is being assessed for Rehab by PT/OT/Speech and PM&R if indicated.  We discussed stroke and secondary prevention and continuing antiplatelet therapy.   Lynnae Sandhoff, MD Stroke Neurology Page: [phone]

## 2020-10-04 NOTE — Progress Notes (Addendum)
Family Medicine Teaching Service Daily Progress Note Intern Pager: [phone]  Patient name: Jonathan [name] Medical record number: [id number] Date of birth: 07-May-1950 Age: 70 y.o. Gender: male  Primary Care Provider: Leeanne Rio, MD Consultants: Gen sx, neuro Code Status: Full  Pt Overview and Major Events to Date:  6/10 Admitted 6/11 Appendectomy  Assessment and Plan: CIARAN BEGAY is a 70 y.o. male presenting with altered mental status, aphasia and code sepsis initiated . PMH is significant for COPD, HTN, hypertensive retinopathy, MDD, T2DM, HLD, right eye retinal detachment, renal mass, tobacco use, hx of stroke 2/2 cerebral A thrombosis, OSA   Sepsis due to likely Intra-abdominal Infection  Appendicitis with Mirco-Perforation, s/p appendectomy POD# 1 VSS overnight. Hgb stable. Vanc (6/11), Cefepime (6/10). BCx no growth for 1 day. - General surgery consulted, appreciate recommendations (delay VTE >24 hrs, consider restarting plavix; 5 days total abx; ok to discharge) - Continue Zosyn (6/11-); needs total of 5 days (abx to stop 6/14); consider oral agent - AM CMP, CBC - Follow up BCx, UCx   Subacute CVA Pt with negative acute CT Head for CVA however MRI Brain with evidence of subacute ischemia. Known 8 mm mass at the right foramen magnum, stable suggesting a benign lesion. Right mastoid and middle ear effusion. MRA Brain:No emergent finding. Generalized intracranial atherosclerosis with high-grade ACA and left PCA narrowings.  -Neurology consulted (unable to see patient yesterday due to surgery) will appreciate recs - f/u carotid dopp -Consider restarting plavix -Continue statin -Neuro checks   HTN BP 145/74. Home BP meds held. Consider restarting.   Incidental Finding: Right Mastoid and Middle Ear Effusion MR Brain above. - Antibiotics as above - Monitor sx    Elevated Troponin  New EKG findings No chest pain on admission. Initial trop 32>26. EKG with new T wave  inversion at I, V4-6; possible U wave on prior since admission. Echo normal EF w/ G2DD.  - repeat AM EKG - Consider cardiology consult    FEN/GI: Carb mod PPx: Holding Lovenox (consider restarting today)   Status is: Inpatient  Remains inpatient appropriate because:Ongoing diagnostic testing needed not appropriate for outpatient work up and Neurology consult pending.  Dispo: The patient is from: Home              Anticipated d/c is to: Home              Patient currently is not medically stable to d/c.   Difficult to place patient No  Subjective:  Doing well. Endorsing having no pain.   Objective: Temp:  [97.7 F (36.5 C)-99.1 F (37.3 C)] 98.5 F (36.9 C) (06/12 0408) Pulse Rate:  [49-66] 52 (06/12 0408) Resp:  [16-22] 16 (06/12 0408) BP: (114-203)/(56-81) 144/74 (06/12 0408) SpO2:  [87 %-99 %] 95 % (06/12 0408) Weight:  [92.5 kg] 92.5 kg (06/12 0422)  Physical Exam: General: Appears well, no acute distress. Age appropriate. Cardiac: RRR, normal but faint heart sounds, no murmurs Respiratory: Diffuse expiratory wheeze, normal effort Abdomen: soft, nontender, nondistended, NABS. Sx incision without drainage or erythema.  Extremities: No edema or cyanosis. Neuro: alert and oriented x4. Moves all limbs appropriately. Symmetric facial expression. Normal sensation bilaterally.  Psych: normal affect   Laboratory: Recent Labs  Lab 10/02/20 2217 10/02/20 2253 10/03/20 0630 10/04/20 0055  WBC 8.8  --  15.9* 16.8*  HGB 16.8 18.0* 14.8 13.2  HCT 50.5 53.0* 44.8 39.8  PLT 202  --  158 169   Recent Labs  Lab 10/02/20 2217 10/02/20 2253 10/03/20 0630 10/04/20 0055  NA 136 139 135 136  K 3.9 3.9 4.1 4.1  CL 105 104 107 108  CO2 20*  --  19* 21*  BUN 13 14 15 22   CREATININE 1.42* 1.20 1.34* 1.61*  CALCIUM 8.9  --  8.0* 8.0*  PROT 7.3  --  6.3* 6.1*  BILITOT 1.4*  --  1.1 0.5  ALKPHOS 99  --  73 66  ALT 34  --  28 23  AST 26  --  20 17  GLUCOSE 154* 155* 159*  177*    Imaging/Diagnostic Tests: No new imaging  Gerlene Fee, DO 10/04/2020, 7:31 AM PGY-2, [facility] Intern pager: [phone], text pages welcome

## 2020-10-04 NOTE — Progress Notes (Signed)
[facility] Progress Note  1 Day Post-Op  Subjective: CC-  Feeling much better than prior to surgery. Abdomen sore but denies significant pain. Not taking any pain medication. Tolerating diet. Denies n/v. Passing flatus, no BM. Ambulated in room without issues. Urinating without issues.  Objective: Vital signs in last 24 hours: Temp:  [97.7 F (36.5 C)-99.1 F (37.3 C)] 98.5 F (36.9 C) (06/12 0408) Pulse Rate:  [49-66] 52 (06/12 0408) Resp:  [16-22] 16 (06/12 0408) BP: (114-203)/(56-81) 144/74 (06/12 0408) SpO2:  [87 %-99 %] 95 % (06/12 0408) Weight:  [92.5 kg] 92.5 kg (06/12 0422) Last BM Date: 10/03/20  Intake/Output from previous day: 06/11 0701 - 06/12 0700 In: 1238.4 [P.O.:360; I.V.:700; IV Piggyback:178.4] Out: 975 [Urine:925; Blood:50] Intake/Output this shift: No intake/output data recorded.  PE: Gen:  Alert, NAD, pleasant Pulm: rate and effort normal Abd: Soft, minimal distension, +BS, lap incisions C/D/I, very mild RLQ TTP without rebound or guarding  Lab Results:  Recent Labs    10/03/20 0630 10/04/20 0055  WBC 15.9* 16.8*  HGB 14.8 13.2  HCT 44.8 39.8  PLT 158 169   BMET Recent Labs    10/03/20 0630 10/04/20 0055  NA 135 136  K 4.1 4.1  CL 107 108  CO2 19* 21*  GLUCOSE 159* 177*  BUN 15 22  CREATININE 1.34* 1.61*  CALCIUM 8.0* 8.0*   PT/INR Recent Labs    10/02/20 2217  LABPROT 13.1  INR 1.0   CMP     Component Value Date/Time   NA 136 10/04/2020 0055   NA 139 05/19/2020 1156   K 4.1 10/04/2020 0055   CL 108 10/04/2020 0055   CO2 21 (L) 10/04/2020 0055   GLUCOSE 177 (H) 10/04/2020 0055   BUN 22 10/04/2020 0055   BUN 16 05/19/2020 1156   CREATININE 1.61 (H) 10/04/2020 0055   CREATININE 1.22 10/31/2012 1117   CALCIUM 8.0 (L) 10/04/2020 0055   PROT 6.1 (L) 10/04/2020 0055   PROT 7.7 03/26/2020 1220   ALBUMIN 2.7 (L) 10/04/2020 0055   ALBUMIN 4.6 03/26/2020 1220   AST 17 10/04/2020 0055   ALT 23 10/04/2020 0055    ALKPHOS 66 10/04/2020 0055   BILITOT 0.5 10/04/2020 0055   BILITOT 0.2 03/26/2020 1220   GFRNONAA 46 (L) 10/04/2020 0055   GFRAA 62 05/19/2020 1156   Lipase  No results found for: LIPASE     Studies/Results: MR ANGIO HEAD WO CONTRAST  Result Date: 10/03/2020 CLINICAL DATA:  Neuro deficit. EXAM: MRA HEAD WITHOUT CONTRAST TECHNIQUE: Angiographic images of the Circle of Willis were acquired using MRA technique without intravenous contrast. COMPARISON:  No pertinent prior exam. FINDINGS: Anterior circulation: Atheromatous irregularity of the cavernous carotids and bilateral MCA/ACA branches. High-grade right A2 branch stenosis with flow gap. Negative for aneurysm or major branch occlusion. Posterior circulation: The vertebral and basilar arteries are smoothly contoured and diffusely patent. Mild vertebral tortuosity. Bilateral PCA atheromatous irregularity with high-grade left P2 segment stenosis causing flow gap. Anatomic variants: None significant Other: None. IMPRESSION: 1. No emergent finding. 2. Generalized intracranial atherosclerosis with high-grade ACA and left PCA narrowings. Electronically Signed   By: Monte Fantasia M.D.   On: 10/03/2020 07:36   MR BRAIN WO CONTRAST  Result Date: 10/03/2020 CLINICAL DATA:  Initial evaluation for acute delirium, aphasia. EXAM: MRI HEAD WITHOUT CONTRAST TECHNIQUE: Multiplanar, multiecho pulse sequences of the brain and surrounding structures were obtained without intravenous contrast. COMPARISON:  Previous CT from 10/02/2020 as well as  previous MRI from 06/03/2017. FINDINGS: Brain: Cerebral volume within normal limits for age. Few scattered remote lacunar infarcts present about the left basal ganglia, right thalamus, and right pons. There is subtle linear diffusion abnormality involving the subcortical white matter of the anterior left frontal lobe (series 5, image 80) evidence of associated T2/FLAIR signal abnormality without discernible ADC correlate.  Finding favored to reflect changes of subacute ischemia. No associated hemorrhage or mass effect. No other diffusion abnormality to suggest acute or subacute ischemia elsewhere within the brain. Gray-white matter differentiation maintained. No encephalomalacia to suggest chronic cortical infarction. No evidence for acute or chronic intracranial hemorrhage. 8 mm well-circumscribed lesion at the right foramen magnum is seen, stable from previous. No other mass lesion, midline shift or mass effect. No hydrocephalus or extra-axial fluid collection. Pituitary gland suprasellar region within normal limits. Midline structures intact. Vascular: Major intracranial vascular flow voids are maintained. Skull and upper cervical spine: Craniocervical junction within normal limits. Degenerative spondylosis noted at C3-4 with mild spinal stenosis. Bone marrow signal intensity normal. No focal marrow replacing lesion. No scalp soft tissue abnormality. Sinuses/Orbits: Sequelae of prior bilateral ocular lens replacement. Scattered mucosal thickening noted within the ethmoidal air cells and maxillary sinuses. Right mastoid and middle ear effusion again noted. 8 mm retention cyst versus Tornwaldt cyst noted at the central nasopharynx. Other: Area of increased T2 signal intensity within the left parotid gland appears to demonstrate signal dropout on FLAIR sequence, consistent with fat. IMPRESSION: 1. Subtle linear diffusion abnormality involving the subcortical white matter of the anterior left frontal lobe, most consistent with evolving subacute ischemia. No associated hemorrhage or mass effect. 2. No other acute intracranial abnormality. Underlying chronic lacunar infarcts at the left basal ganglia, right thalamus, and right pons. 3. 8 mm mass at the right foramen magnum, stable as compared to previous exams dating back to 2017, suggesting a benign lesion. Differential considerations include a benign enhancing foramen magnum lesion,  nerve sheath tumor, or possibly meningioma. 4. Right mastoid and middle ear effusion. Correlation with physical exam for possible acute otomastoiditis recommended. Electronically Signed   By: Jeannine Boga M.D.   On: 10/03/2020 03:48   CT ABDOMEN PELVIS W CONTRAST  Result Date: 10/03/2020 CLINICAL DATA:  Acute abdominal pain EXAM: CT ABDOMEN AND PELVIS WITH CONTRAST TECHNIQUE: Multidetector CT imaging of the abdomen and pelvis was performed using the standard protocol following bolus administration of intravenous contrast. CONTRAST:  4mL OMNIPAQUE IOHEXOL 300 MG/ML  SOLN COMPARISON:  Abdominal MRI 05/07/2020 FINDINGS: Lower chest: Prominent atheromatous plaque in the descending aorta. Coronary atherosclerosis. No acute finding Hepatobiliary: Hepatic steatosis.No evidence of biliary obstruction or stone. Pancreas: Unremarkable. Spleen: Unremarkable. Adrenals/Urinary Tract: Negative adrenals. Solid enhancing mass at the right renal pelvis measuring 3 cm, known from prior abdominal MRI. Tiny renal cortical cysts. No hydronephrosis. Unremarkable bladder. Stomach/Bowel: Thick walled distal appendix with adjacent fat stranding, 15 mm in outer wall diameter. The appendix is in expected location extending inferiorly from the cecum. A small bubble of extraluminal gas is present on coronal reformats. No bowel obstruction. Vascular/Lymphatic: No acute vascular abnormality. Prominent atheromatous plaque. No mass or adenopathy. Reproductive:No pathologic findings. Other: Shallow and fatty right inguinal hernia. Musculoskeletal: No acute abnormalities. Chronic L5 pars defects with L5-S1 anterolisthesis and biforaminal stenosis. L4-5 notable disc degeneration as well. Pending direct communication in [place]. IMPRESSION: 1. Acute appendicitis involving the tip. Tiny extraluminal gas bubble compatible with perforation. No abscess or generalized pneumoperitoneum. 2. 3 cm right renal mass/neoplasm which is known  from  abdominal MRI earlier this year. Electronically Signed   By: Monte Fantasia M.D.   On: 10/03/2020 07:15   DG Chest Portable 1 View  Result Date: 10/02/2020 CLINICAL DATA:  Altered mental status EXAM: PORTABLE CHEST 1 VIEW COMPARISON:  11/18/2010 FINDINGS: Heart and mediastinal contours are within normal limits. No focal opacities or effusions. No acute bony abnormality. IMPRESSION: No active disease. Electronically Signed   By: Rolm Baptise M.D.   On: 10/02/2020 23:01   ECHOCARDIOGRAM COMPLETE  Result Date: 10/03/2020    ECHOCARDIOGRAM REPORT   Patient Name:   ALTER MOSS Date of Exam: 10/03/2020 Medical Rec #:  [id number]      Height:       66.0 in Accession #:    [id number]     Weight:       201.4 lb Date of Birth:  [birth year]      BSA:          2.006 m Patient Age:    70 years       BP:           165/73 mmHg Patient Gender: M              HR:           55 bpm. Exam Location:  Inpatient Procedure: 2D Echo, Cardiac Doppler and Color Doppler Indications:    Stroke  History:        Patient has prior history of Echocardiogram examinations, most                 recent 04/04/2016. COPD; Risk Factors:Diabetes, Hypertension,                 Dyslipidemia and Sleep Apnea.  Sonographer:    Clayton Lefort RDCS (AE) Referring Phys: [id number] [facility]  Sonographer Comments: Suboptimal subcostal window. IMPRESSIONS  1. Left ventricular ejection fraction, by estimation, is 55 to 60%. The left ventricle has normal function. The left ventricle has no regional wall motion abnormalities. There is mild concentric left ventricular hypertrophy. Left ventricular diastolic parameters are consistent with Grade II diastolic dysfunction (pseudonormalization). Elevated left atrial pressure.  2. Right ventricular systolic function is normal. The right ventricular size is normal.  3. Left atrial size was mildly dilated.  4. The mitral valve is normal in structure. Trivial mitral valve regurgitation. No evidence of mitral stenosis.   5. The aortic valve is tricuspid. There is mild thickening of the aortic valve. Aortic valve regurgitation is not visualized. Mild aortic valve sclerosis is present, with no evidence of aortic valve stenosis. Comparison(s): No significant change from prior study. Conclusion(s)/Recommendation(s): No intracardiac source of embolism detected on this transthoracic study. A transesophageal echocardiogram is recommended to exclude cardiac source of embolism if clinically indicated. FINDINGS  Left Ventricle: Left ventricular ejection fraction, by estimation, is 55 to 60%. The left ventricle has normal function. The left ventricle has no regional wall motion abnormalities. The left ventricular internal cavity size was normal in size. There is  mild concentric left ventricular hypertrophy. Left ventricular diastolic parameters are consistent with Grade II diastolic dysfunction (pseudonormalization). Elevated left atrial pressure. Right Ventricle: The right ventricular size is normal. No increase in right ventricular wall thickness. Right ventricular systolic function is normal. Left Atrium: Left atrial size was mildly dilated. Right Atrium: Right atrial size was normal in size. Pericardium: There is no evidence of pericardial effusion. Mitral Valve: The mitral valve is normal in structure. There is mild thickening of  the mitral valve leaflet(s). Mild mitral annular calcification. Trivial mitral valve regurgitation. No evidence of mitral valve stenosis. MV peak gradient, 3.8 mmHg. The mean mitral valve gradient is 1.0 mmHg. Tricuspid Valve: The tricuspid valve is normal in structure. Tricuspid valve regurgitation is trivial. Aortic Valve: The aortic valve is tricuspid. There is mild thickening of the aortic valve. Aortic valve regurgitation is not visualized. Mild aortic valve sclerosis is present, with no evidence of aortic valve stenosis. Aortic valve mean gradient measures 4.0 mmHg. Aortic valve peak gradient measures 8.1  mmHg. Aortic valve area, by VTI measures 3.40 cm. Pulmonic Valve: The pulmonic valve was normal in structure. Pulmonic valve regurgitation is trivial. Aorta: The aortic root and ascending aorta are structurally normal, with no evidence of dilitation. Venous: The inferior vena cava was not well visualized. IAS/Shunts: No atrial level shunt detected by color flow Doppler.  LEFT VENTRICLE PLAX 2D LVIDd:         4.20 cm  Diastology LVIDs:         2.70 cm  LV e' medial:    4.00 cm/s LV PW:         1.70 cm  LV E/e' medial:  19.8 LV IVS:        1.80 cm  LV e' lateral:   4.06 cm/s LVOT diam:     2.30 cm  LV E/e' lateral: 19.5 LV SV:         97 LV SV Index:   48 LVOT Area:     4.15 cm  RIGHT VENTRICLE RV Basal diam:  3.10 cm RV S prime:     12.40 cm/s TAPSE (M-mode): 2.4 cm LEFT ATRIUM           Index       RIGHT ATRIUM           Index LA diam:      4.70 cm 2.34 cm/m  RA Area:     15.10 cm LA Vol (A2C): 58.9 ml 29.37 ml/m RA Volume:   34.60 ml  17.25 ml/m LA Vol (A4C): 72.8 ml 36.30 ml/m  AORTIC VALVE AV Area (Vmax):    3.36 cm AV Area (Vmean):   3.03 cm AV Area (VTI):     3.40 cm AV Vmax:           142.00 cm/s AV Vmean:          92.300 cm/s AV VTI:            0.285 m AV Peak Grad:      8.1 mmHg AV Mean Grad:      4.0 mmHg LVOT Vmax:         115.00 cm/s LVOT Vmean:        67.300 cm/s LVOT VTI:          0.233 m LVOT/AV VTI ratio: 0.82  AORTA Ao Root diam: 3.50 cm Ao Asc diam:  3.30 cm MITRAL VALVE MV Area (PHT): 3.42 cm    SHUNTS MV Area VTI:   2.47 cm    Systemic VTI:  0.23 m MV Peak grad:  3.8 mmHg    Systemic Diam: 2.30 cm MV Mean grad:  1.0 mmHg MV Vmax:       0.98 m/s MV Vmean:      51.5 cm/s MV Decel Time: 222 msec MV E velocity: 79.00 cm/s MV A velocity: 75.90 cm/s MV E/A ratio:  1.04 Gwyndolyn Kaufman MD Electronically signed by Gwyndolyn Kaufman MD Signature Date/Time: 10/03/2020/5:37:40 PM  Final    CT HEAD CODE STROKE WO CONTRAST  Result Date: 10/02/2020 CLINICAL DATA:  Code stroke. Initial  evaluation for acute code stroke. EXAM: CT HEAD WITHOUT CONTRAST TECHNIQUE: Contiguous axial images were obtained from the base of the skull through the vertex without intravenous contrast. COMPARISON:  Prior MRI from 06/03/2017 FINDINGS: Brain: Generalized age-related cerebral atrophy with mild chronic small vessel ischemic disease. Small remote lacunar infarct at the right thalamus. No acute intracranial hemorrhage. No acute large vessel territory infarct. No mass lesion, midline shift or mass effect. No hydrocephalus or extra-axial fluid collection. Vascular: No convincing hyperdense vessel. Both MCAs are somewhat dense in appearance, although fairly symmetric in appearance. Scattered vascular calcifications noted within the carotid siphons. Skull: Scalp soft tissues and calvarium within normal limits. Sinuses/Orbits: Globes and orbital soft tissues demonstrate no acute finding. Mild mucosal thickening noted within the sphenoid ethmoidal and maxillary sinuses. Prominent right mastoid and middle ear effusion. Additional trace left mastoid effusion noted as well. Other: None. ASPECTS [facility] Stroke Program Early CT Score) - Ganglionic level infarction (caudate, lentiform nuclei, internal capsule, insula, M1-M3 cortex): 7 - Supraganglionic infarction (M4-M6 cortex): 3 Total score (0-10 with 10 being normal): 10 IMPRESSION: 1. No acute intracranial infarct or other abnormality. 2. ASPECTS is 10. 3. Age-related cerebral atrophy with mild chronic small vessel ischemic disease, with small remote right thalamic lacunar infarct. 4. Right mastoid and middle ear effusion. Correlation with physical exam recommended. These results were communicated to Dr. Lorrin Goodell At 10:39 pmon 6/10/2022by text page via the [facility] messaging system. Electronically Signed   By: Jeannine Boga M.D.   On: 10/02/2020 22:47    Anti-infectives: Anti-infectives (From admission, onward)    Start     Dose/Rate Route Frequency Ordered Stop    10/03/20 0845  metroNIDAZOLE (FLAGYL) IVPB 500 mg  Status:  Discontinued        500 mg 100 mL/hr over 60 Minutes Intravenous Every 8 hours 10/03/20 0746 10/03/20 0749   10/03/20 0845  piperacillin-tazobactam (ZOSYN) IVPB 3.375 g        3.375 g 12.5 mL/hr over 240 Minutes Intravenous Every 8 hours 10/03/20 0758     10/03/20 0700  cefTRIAXone (ROCEPHIN) 2 g in sodium chloride 0.9 % 100 mL IVPB  Status:  Discontinued        2 g 200 mL/hr over 30 Minutes Intravenous Every 24 hours 10/03/20 0641 10/03/20 0749   10/03/20 0615  doxycycline (VIBRA-TABS) tablet 100 mg  Status:  Discontinued        100 mg Oral Every 12 hours 10/03/20 0524 10/03/20 0641   10/02/20 2345  vancomycin (VANCOREADY) IVPB 2000 mg/400 mL        2,000 mg 200 mL/hr over 120 Minutes Intravenous  Once 10/02/20 2333 10/03/20 0240   10/02/20 2345  ceFEPIme (MAXIPIME) 2 g in sodium chloride 0.9 % 100 mL IVPB        2 g 200 mL/hr over 30 Minutes Intravenous  Once 10/02/20 2333 10/03/20 0034        Assessment/Plan   Acute perforated appendicitis S/p laparoscopic appendectomy 6/11 Dr. Rosendo Gros - POD#1 - Doing well from surgical standpoint. Tolerating diet, pain well controlled, mobilizing without issues. Ok for discharge when cleared by primary team. His creatinine did go up a small amount. He needs 5 days of antibiotics postoperatively (continue zosyn during admission, switch to augmentin at discharge). Discharge instructions and follow up info on AVS. I will send rx for pain medication to his pharmacy.  Ok to restart plavix at discharge.   ID - maxipime/vancomycin 6/10, zosyn 6/11>> VTE - SCDs, ok for chemical DVT prophylaxis from surgical standpoint FEN - IVF, reg diet Foley - none Follow up - [facility]  HTN HLD DM Hx CVA 3 years ago on plavix (last dose 6/8) COPD Tobacco abuse AKI   LOS: 1 day    Wellington Hampshire, [facility] Surgery 10/04/2020, 8:20 AM Please see Amion for pager number during day  hours 7:00am-4:30pm

## 2020-10-04 NOTE — Evaluation (Signed)
Occupational Therapy Evaluation and Discharge Patient Details Name: Jonathan [name] MRN: [id number] DOB: 1950/06/07 Today's Date: 10/04/2020    History of Present Illness Jonathan [name] is a 70 y.o. male presenting with altered mental status, aphasia and code sepsis initiated . CT: negative. MRI: negative for acute, underlying chronic  lacunar infarcts at the left basal ganglia, right thalamus, and  right pons. Found to have appendicitis with Mirco-Perforation, s/p laproscopic appendectomy 6/11. PMH is significant for COPD, HTN, hypertensive retinopathy, MDD, T2DM, HLD, right eye retinal detachment, renal mass, tobacco use, hx of stroke 2/2 cerebral A thrombosis, OSA   Clinical Impression   This 70 yo male admitted and underwent above presents to acute OT at an independent level with basic ADLs in his hospital room and he has A prn at home post D/C. No further OT needs, we will D/C.    Follow Up Recommendations  No OT follow up          Precautions / Restrictions Precautions Precautions: None Restrictions Weight Bearing Restrictions: No      Mobility Bed Mobility               General bed mobility comments: up OOb upon my arrival, sitting in regular chair    Transfers Overall transfer level: Independent                    Balance Overall balance assessment: Independent                                         ADL either performed or assessed with clinical judgement   ADL Overall ADL's : Independent                                             Vision Baseline Vision/History: Wears glasses Wears Glasses: At all times Patient Visual Report: No change from baseline              Pertinent Vitals/Pain Pain Assessment:  ("only when the press on my belly")     Hand Dominance Right   Extremity/Trunk Assessment Upper Extremity Assessment Upper Extremity Assessment: Overall WFL for tasks assessed            Communication Communication Communication: No difficulties   Cognition Arousal/Alertness: Awake/alert Behavior During Therapy: WFL for tasks assessed/performed Overall Cognitive Status: Within Functional Limits for tasks assessed                                                Home Living Family/patient expects to be discharged to:: Private residence Living Arrangements: Spouse/significant other;Children Available Help at Discharge: Family Type of Home: House Home Access: Ramped entrance     Home Layout: One level               Home Equipment: Cane - single point          Prior Functioning/Environment Level of Independence: Independent        Comments: Loves to fish and hunt                 OT Goals(Current goals can be found in the  care plan section) Acute Rehab OT Goals Patient Stated Goal: to get back to hunting and fishing   AM-PAC OT "6 Clicks" Daily Activity     Outcome Measure Help from another person eating meals?: None Help from another person taking care of personal grooming?: None Help from another person toileting, which includes using toliet, bedpan, or urinal?: None Help from another person bathing (including washing, rinsing, drying)?: None Help from another person to put on and taking off regular upper body clothing?: None Help from another person to put on and taking off regular lower body clothing?: None 6 Click Score: 24   End of Session Nurse Communication:  (Pt was incontinent of urine (all over the floor) due to his IV was plugged in behind his locked recliner and he could not get it unplugged in time to make it to the bathroom.)  Activity Tolerance: Patient tolerated treatment well Patient left: in chair;with call bell/phone within reach                   Time: 1105-1125 OT Time Calculation (min): 20 min Charges:  OT General Charges $OT Visit: 1 Visit OT Evaluation $OT Eval Moderate Complexity: 1 Mod [place], OTR/L Acute [employer] Pager [phone] Office [phone]    Almon Register 10/04/2020, 1:08 PM

## 2020-10-04 NOTE — Evaluation (Signed)
Physical Therapy Evaluation Patient Details Name: Jonathan [name] MRN: [id number] DOB: 02-Oct-1950 Today's Date: 10/04/2020   History of Present Illness  Jonathan [name] is a 70 y.o. male presenting with altered mental status, aphasia and code sepsis initiated . Imaging showing  left frontal subcortical stroke,  underlying chronic  lacunar infarcts at the left basal ganglia, right thalamus, and  right pons. Found to have appendicitis with Mirco-Perforation, s/p laproscopic appendectomy 6/11. PMH is significant for COPD, HTN, hypertensive retinopathy, MDD, T2DM, HLD, right eye retinal detachment, renal mass, tobacco use, hx of stroke 2/2 cerebral A thrombosis, OSA  Clinical Impression   Pt admitted with above diagnosis. Loves at home with family in a single level home with a ramped entrance; Independent at baseline, enjoys hunting and fishing; Presents to PT with mild balance deficits, decr activity tolerance;  Pt currently with functional limitations due to the deficits listed below (see PT Problem List). Pt will benefit from skilled PT to increase their independence and safety with mobility to allow discharge to the venue listed below.       Follow Up Recommendations No PT follow up    Equipment Recommendations  None recommended by PT    Recommendations for Other Services       Precautions / Restrictions Precautions Precautions: None;Other (comment) Precaution Comments: Watch vitals; per RN had a bout with bradycardia earlier      Mobility  Bed Mobility Overal bed mobility: Independent;Modified Independent             General bed mobility comments: Used bedrails, but no difficulty getting up to EOB    Transfers Overall transfer level: Independent               General transfer comment: no diffiulty  Ambulation/Gait Ambulation/Gait assistance: Min guard;Supervision Gait Distance (Feet): 100 Feet Assistive device: None;IV Pole Gait Pattern/deviations: Step-through  pattern Gait velocity: Slow   General Gait Details: Slow pace, but managing well; cues to self-monitor for activity tolerance  Stairs            Wheelchair Mobility    Modified Rankin (Stroke Patients Only) Modified Rankin (Stroke Patients Only) Pre-Morbid Rankin Score: No symptoms Modified Rankin: No significant disability     Balance Overall balance assessment: No apparent balance deficits (not formally assessed)                                           Pertinent Vitals/Pain Pain Assessment:  ("only when the press on my belly")    Home Living Family/patient expects to be discharged to:: Private residence Living Arrangements: Spouse/significant other;Children Available Help at Discharge: Family Type of Home: House Home Access: Ramped entrance     Home Layout: One level Home Equipment: Cane - single point      Prior Function Level of Independence: Independent         Comments: Loves to fish and hunt     Hand Dominance   Dominant Hand: Right    Extremity/Trunk Assessment   Upper Extremity Assessment Upper Extremity Assessment: Overall WFL for tasks assessed    Lower Extremity Assessment Lower Extremity Assessment: Overall WFL for tasks assessed       Communication   Communication: No difficulties  Cognition Arousal/Alertness: Awake/alert Behavior During Therapy: WFL for tasks assessed/performed Overall Cognitive Status: Within Functional Limits for tasks assessed  General Comments General comments (skin integrity, edema, etc.): VSS on room air; HR range 56-81bpm    Exercises     Assessment/Plan    PT Assessment Patient needs continued PT services  PT Problem List Decreased strength;Decreased range of motion;Decreased activity tolerance;Decreased balance;Decreased knowledge of use of DME;Decreased safety awareness;Decreased knowledge of precautions       PT  Treatment Interventions DME instruction;Gait training;Stair training;Functional mobility training;Therapeutic activities;Therapeutic exercise;Balance training;Neuromuscular re-education;Patient/family education    PT Goals (Current goals can be found in the Care Plan section)  Acute Rehab PT Goals Patient Stated Goal: to get back to hunting and fishing PT Goal Formulation: With patient Time For Goal Achievement: 10/18/20 Potential to Achieve Goals: Good    Frequency Min 4X/week   Barriers to discharge        Co-evaluation               AM-PAC PT "6 Clicks" Mobility  Outcome Measure Help needed turning from your back to your side while in a flat bed without using bedrails?: None Help needed moving from lying on your back to sitting on the side of a flat bed without using bedrails?: None Help needed moving to and from a bed to a chair (including a wheelchair)?: None Help needed standing up from a chair using your arms (e.g., wheelchair or bedside chair)?: None Help needed to walk in hospital room?: None Help needed climbing 3-5 steps with a railing? : A Little 6 Click Score: 23    End of Session Equipment Utilized During Treatment: Gait belt Activity Tolerance: Patient tolerated treatment well Patient left: in bed;with call bell/phone within reach Nurse Communication: Mobility status PT Visit Diagnosis: Other abnormalities of gait and mobility (R26.89)    Time: 3614-4315 PT Time Calculation (min) (ACUTE ONLY): 20 min   Charges:   PT Evaluation $PT Eval Low Complexity: [place], PT  Acute Rehabilitation Services Pager [phone] Office [place] 10/04/2020, 5:12 PM

## 2020-10-04 NOTE — Discharge Instructions (Addendum)
[facility]  Please arrive at least 30 min before your appointment to complete your check in paperwork.  If you are unable to arrive 30 min prior to your appointment time we may have to cancel or reschedule you. LAPAROSCOPIC SURGERY: POST OP INSTRUCTIONS Always review your discharge instruction sheet given to you by the facility where your surgery was performed. IF YOU HAVE DISABILITY OR FAMILY LEAVE FORMS, YOU MUST BRING THEM TO THE OFFICE FOR PROCESSING.   DO NOT GIVE THEM TO YOUR DOCTOR.  PAIN CONTROL  First take acetaminophen (Tylenol) AND/or ibuprofen (Advil) to control your pain after surgery.  Follow directions on package.  Taking acetaminophen (Tylenol) and/or ibuprofen (Advil) regularly after surgery will help to control your pain and lower the amount of prescription pain medication you may need.  You should not take more than 4,000 mg (4 grams) of acetaminophen (Tylenol) in 24 hours.  You should not take ibuprofen (Advil), aleve, motrin, naprosyn or other NSAIDS if you have a history of stomach ulcers or chronic kidney disease.  A prescription for pain medication may be given to you upon discharge.  Take your pain medication as prescribed, if you still have uncontrolled pain after taking acetaminophen (Tylenol) or ibuprofen (Advil). Use ice packs to help control pain. If you need a refill on your pain medication, please contact your pharmacy.  They will contact our office to request authorization. Prescriptions will not be filled after 5pm or on week-ends.  HOME MEDICATIONS Take your usually prescribed medications unless otherwise directed.  DIET You should follow a light diet the first few days after arrival home.  Be sure to include lots of fluids daily. Avoid fatty, fried foods.   CONSTIPATION It is common to experience some constipation after surgery and if you are taking pain medication.  Increasing fluid intake and taking a stool softener (such as Colace)  will usually help or prevent this problem from occurring.  A mild laxative (Milk of Magnesia or Miralax) should be taken according to package instructions if there are no bowel movements after 48 hours.  WOUND/INCISION CARE Most patients will experience some swelling and bruising in the area of the incisions.  Ice packs will help.  Swelling and bruising can take several days to resolve.  Unless discharge instructions indicate otherwise, follow guidelines below  STERI-STRIPS - you may remove your outer bandages 48 hours after surgery, and you may shower at that time.  You have steri-strips (small skin tapes) in place directly over the incision.  These strips should be left on the skin for 7-10 days.   DERMABOND/SKIN GLUE - you may shower in 24 hours.  The glue will flake off over the next 2-3 weeks. Any sutures or staples will be removed at the office during your follow-up visit.  ACTIVITIES You may resume regular (light) daily activities beginning the next day--such as daily self-care, walking, climbing stairs--gradually increasing activities as tolerated.  You may have sexual intercourse when it is comfortable.  Refrain from any heavy lifting or straining until approved by your doctor. You may drive when you are no longer taking prescription pain medication, you can comfortably wear a seatbelt, and you can safely maneuver your car and apply brakes.  FOLLOW-UP You should see your doctor in the office for a follow-up appointment approximately 2-3 weeks after your surgery.  You should have been given your post-op/follow-up appointment when your surgery was scheduled.  If you did not receive a post-op/follow-up appointment, make sure   that you call for this appointment within a day or two after you arrive home to insure a convenient appointment time.  OTHER INSTRUCTIONS  WHEN TO CALL YOUR DOCTOR: Fever over 101.0 Inability to urinate Continued bleeding from incision. Increased pain, redness, or  drainage from the incision. Increasing abdominal pain  The clinic staff is available to answer your questions during regular business hours.  Please don't hesitate to call and ask to speak to one of the nurses for clinical concerns.  If you have a medical emergency, go to the nearest emergency room or call 911.  A surgeon from [facility] is always on call at the hospital. [address], [place], [place], [place]  [address] ? [address], [place],    [address] [phone] ? [phone] ? FAX [phone]  Dear Jonathan [name],  There are certain changes and additions to your home medications. Please see below:  --Please take antibiotic Augmentin 2 times daily until your bottle is finished, starting today's evening. --Please take aspirin 325 mg and Plavix 75 mg daily for next 3 months starting tomorrow morning and then stop taking aspirin after 3 months but continue taking Plavix 75 mg.  Please make a follow-up appointment with neurology. -- Please continue taking all your other medications as they were prescribed --See your PCP within a week to start inhaler treatments for your COPD regularly --Also, please follow-up on hemoglobin A1c-which tells about your  diabetes mellitus with your PCP and adjust medications as needed.  It was pleasure taking care of you! Dr. Demaris Callander

## 2020-10-05 ENCOUNTER — Other Ambulatory Visit (HOSPITAL_COMMUNITY): Payer: Self-pay

## 2020-10-05 DIAGNOSIS — I63422 Cerebral infarction due to embolism of left anterior cerebral artery: Secondary | ICD-10-CM

## 2020-10-05 DIAGNOSIS — I10 Essential (primary) hypertension: Secondary | ICD-10-CM

## 2020-10-05 DIAGNOSIS — F17200 Nicotine dependence, unspecified, uncomplicated: Secondary | ICD-10-CM

## 2020-10-05 LAB — CBC
HCT: 40.3 % (ref 39.0–52.0)
Hemoglobin: 13 g/dL (ref 13.0–17.0)
MCH: 29.5 pg (ref 26.0–34.0)
MCHC: 32.3 g/dL (ref 30.0–36.0)
MCV: 91.6 fL (ref 80.0–100.0)
Platelets: 183 10*3/uL (ref 150–400)
RBC: 4.4 MIL/uL (ref 4.22–5.81)
RDW: 13.9 % (ref 11.5–15.5)
WBC: 12.6 10*3/uL — ABNORMAL HIGH (ref 4.0–10.5)
nRBC: 0 % (ref 0.0–0.2)

## 2020-10-05 LAB — GLUCOSE, CAPILLARY
Glucose-Capillary: 95 mg/dL (ref 70–99)
Glucose-Capillary: 116 mg/dL — ABNORMAL HIGH (ref 70–99)
Glucose-Capillary: 107 mg/dL — ABNORMAL HIGH (ref 70–99)

## 2020-10-05 LAB — COMPREHENSIVE METABOLIC PANEL
ALT: 40 U/L (ref 0–44)
AST: 25 U/L (ref 15–41)
Albumin: 2.7 g/dL — ABNORMAL LOW (ref 3.5–5.0)
Alkaline Phosphatase: 56 U/L (ref 38–126)
Anion gap: 6 (ref 5–15)
BUN: 21 mg/dL (ref 8–23)
CO2: 18 mmol/L — ABNORMAL LOW (ref 22–32)
Calcium: 7.8 mg/dL — ABNORMAL LOW (ref 8.9–10.3)
Chloride: 113 mmol/L — ABNORMAL HIGH (ref 98–111)
Creatinine, Ser: 1.4 mg/dL — ABNORMAL HIGH (ref 0.61–1.24)
GFR, Estimated: 54 mL/min — ABNORMAL LOW (ref >60)
Glucose, Bld: 87 mg/dL (ref 70–99)
Potassium: 3.7 mmol/L (ref 3.5–5.1)
Sodium: 137 mmol/L (ref 135–145)
Total Bilirubin: 0.8 mg/dL (ref 0.3–1.2)
Total Protein: 5.6 g/dL — ABNORMAL LOW (ref 6.5–8.1)

## 2020-10-05 LAB — HEMOGLOBIN A1C
Hgb A1c MFr Bld: 7.1 % — ABNORMAL HIGH (ref 4.8–5.6)
Mean Plasma Glucose: 157 mg/dL

## 2020-10-05 LAB — PROCALCITONIN: Procalcitonin: 20.59 ng/mL

## 2020-10-05 MED ORDER — LISINOPRIL 40 MG PO TABS
40.0000 mg | ORAL_TABLET | Freq: Every day | ORAL | Status: DC
  Administered 2020-10-05: 40 mg via ORAL
  Filled 2020-10-05: qty 1

## 2020-10-05 MED ORDER — AMLODIPINE BESYLATE 10 MG PO TABS
10.0000 mg | ORAL_TABLET | Freq: Every day | ORAL | Status: DC
  Administered 2020-10-05: 10 mg via ORAL
  Filled 2020-10-05 (×2): qty 1

## 2020-10-05 MED ORDER — ASPIRIN 325 MG PO TBEC
325.0000 mg | DELAYED_RELEASE_TABLET | Freq: Every day | ORAL | 0 refills | Status: AC
  Filled 2020-10-05: qty 90, 90d supply, fill #0

## 2020-10-05 MED ORDER — ENOXAPARIN SODIUM 40 MG/0.4ML IJ SOSY
40.0000 mg | PREFILLED_SYRINGE | INTRAMUSCULAR | Status: DC
  Administered 2020-10-05: 40 mg via SUBCUTANEOUS
  Filled 2020-10-05: qty 0.4

## 2020-10-05 MED ORDER — ENOXAPARIN SODIUM 40 MG/0.4ML IJ SOSY: 40.0000 mg | PREFILLED_SYRINGE | INTRAMUSCULAR | Status: DC

## 2020-10-05 MED ORDER — HYDRALAZINE HCL 20 MG/ML IJ SOLN
5.0000 mg | Freq: Once | INTRAMUSCULAR | Status: AC
  Administered 2020-10-05: 5 mg via INTRAVENOUS
  Filled 2020-10-05: qty 1

## 2020-10-05 MED ORDER — AMOXICILLIN-POT CLAVULANATE 875-125 MG PO TABS
1.0000 | ORAL_TABLET | Freq: Two times a day (BID) | ORAL | 0 refills | Status: AC
  Filled 2020-10-05: qty 6, 3d supply, fill #0

## 2020-10-05 MED ORDER — LACTATED RINGERS IV SOLN: INTRAVENOUS | Status: DC

## 2020-10-05 MED ORDER — CLOPIDOGREL BISULFATE 75 MG PO TABS
75.0000 mg | ORAL_TABLET | Freq: Every day | ORAL | 0 refills | Status: AC
  Filled 2020-10-05: qty 90, 90d supply, fill #0

## 2020-10-05 MED ORDER — ASPIRIN EC 325 MG PO TBEC
325.0000 mg | DELAYED_RELEASE_TABLET | Freq: Every day | ORAL | Status: DC
  Administered 2020-10-05: 325 mg via ORAL
  Filled 2020-10-05: qty 1

## 2020-10-05 NOTE — Progress Notes (Signed)
FPTS Interim Progress Note  Patient sleeping and resting comfortably.  Rounded with primary RN.  No concerns voiced.  No orders required.  Appreciated nightly round.  Today's Vitals   10/04/20 2312 10/05/20 0417 10/05/20 0420 10/05/20 0423  BP: (!) 171/87 (!) 199/83 (!) 198/89   Pulse: (!) 53 60 64   Resp: 19 19    Temp: 98 F (36.7 C) 98.2 F (36.8 C)    TempSrc: Oral Oral    SpO2: 93% 93% 92%   Weight:    93.8 kg  Height:      PainSc: 0-No pain        Carollee Leitz, MD 10/05/2020, 5:25 AM PGY-2, [facility] pager [phone]

## 2020-10-05 NOTE — Progress Notes (Addendum)
Neurology Progress Note  INTERVAL HISTORY   No family at bedside.  Patient sitting in chair, no complaints.  Eager to go home.  Smoking cessation provided, patient willing to quit.  MEDICATIONS    Current Facility-Administered Medications (Endocrine & Metabolic):    insulin aspart (novoLOG) injection 0-9 Units   Current Facility-Administered Medications (Cardiovascular):    amLODipine (NORVASC) tablet 10 mg   atorvastatin (LIPITOR) tablet 80 mg   lisinopril (ZESTRIL) tablet 40 mg   Current Facility-Administered Medications (Respiratory):    albuterol (PROVENTIL) (2.5 MG/3ML) 0.083% nebulizer solution 2.5 mg   Current Facility-Administered Medications (Analgesics):    acetaminophen (TYLENOL) tablet 650 mg   aspirin EC tablet 325 mg   oxyCODONE (Oxy IR/ROXICODONE) immediate release tablet 5 mg  Current Outpatient Medications (Analgesics):    [START ON 10/06/2020] aspirin 325 MG EC tablet, Take 1 tablet (325 mg total) by mouth daily.   oxyCODONE (OXY IR/ROXICODONE) 5 MG immediate release tablet, Take 1 tablet (5 mg total) by mouth every 6 (six) hours as needed for severe pain.  Current Facility-Administered Medications (Hematological):    clopidogrel (PLAVIX) tablet 75 mg   enoxaparin (LOVENOX) injection 40 mg  Current Outpatient Medications (Hematological):    [START ON 10/06/2020] clopidogrel (PLAVIX) 75 MG tablet, Take 1 tablet (75 mg total) by mouth daily.  Current Facility-Administered Medications (Other):    lactated ringers infusion   piperacillin-tazobactam (ZOSYN) IVPB 3.375 g   polyethylene glycol (MIRALAX / GLYCOLAX) packet 17 g  Current Outpatient Medications (Other):    amoxicillin-clavulanate (AUGMENTIN) 875-125 MG tablet, Take 1 tablet by mouth 2 (two) times daily for 3 days.   VITAL SIGNS  Temp:  [97.8 F (36.6 C)-98.5 F (36.9 C)] 98.5 F (36.9 C) (06/13 1147) Pulse Rate:  [51-85] 85 (06/13 1147) Resp:  [18-19] 18 (06/13 1147) BP: (164-199)/(70-94)  179/78 (06/13 1329) SpO2:  [92 %-96 %] 96 % (06/13 1147) Weight:  [93.8 kg] 93.8 kg (06/13 0423)  PHYSICAL EXAM   General Physical Exam  General: NAD, lying comfortably in bed HENT: Normal oropharynx and mucosa. Normal external appearance of ears and nose. CV/Chest: No JVD, normal S1S2 Lungs: No audible wheezing. Normal work of breathing. No accessory muscle use Abdomen: Non distended, non tender Extremities: Warm and well perfused. No appreciable edema, cyanosis or deformity. Skin: No rash. Normal palpation of skin.   Musculoskeletal: No joint tenderness. Normal digits and nails by inspection. No clubbing.  Neurologic Examination  Mental status/Cognition: alert; oriented to month and age; good attention; no apparent neglect Speech/language: fluent; comprehension intact; object naming intact; repetition intact Visual Fields are full. Pupils are equal, round, and reactive to light. EOMI without ptosis or diploplia.  Facial sensation is symmetric to temperature Facial movement is symmetric.  Hearing is intact to voice. Uvula midline and palate elevates symmetrically. Shoulder shrug is symmetric. Tongue is midline without atrophy or fasciculations.  Tone is normal. Bulk is normal. 5/5 strength was present in all four extremities. Sensation is symmetric to light touch and temperature in the arms and legs. Deep Tendon Reflexes: 2+ and symmetric in the biceps and patellae. Toes are downgoing bilaterally. FNF and HKS are intact bilaterally. Rapid alternating movements are normal without dysdiadochokinesia.  IMAGING/DIAGNOSTIC STUDIES   MRI brain showed subtle linear diffusion abnormality involving the subcortical white matter of the anterior left frontal lobe, most consistent with evolving subacute ischemia. No associated hemorrhage or mass effect. No other acute intracranial abnormality. Underlying chronic lacunar infarcts at the left basal ganglia, right thalamus,  and right pons. MRA  brain showed generalized intracranial atherosclerosis with high-grade ACA and left PCA narrowings.    Lab Results  Component Value Date   HGBA1C 7.1 (H) 10/03/2020   Lab Results  Component Value Date   LDLCALC 134 (H) 06/11/2019     ASSESSMENT AND PLAN  [name] is a 70 y.o. male Hx depression, COPD, HTNive retinopathy, DM2, HLD, OSA, renall mass who presented with abdominal pain x1 day and acute confusion with aphasia found to have appendicitis with perforation s/p emergent surgery. He did not have focal deficits on initial evaluation and was not a candidate for intervention however subsequent imaging showed left frontal subcortical stroke.  Stroke: Left frontal subcortical infarction embolic likely subacute due to left ICA high-grade stenosis, large vessel disease CT head: No acute intracranial infarct or other abnormality. ASPECTS 10.  MRI head Subtle linear diffusion abnormality involving the subcortical white matter of the anterior left frontal lobe, most consistent with evolving subacute ischemia. Underlying chronic lacunar infarcts at the left basal ganglia, right thalamus, and right pons.  MRA head Generalized intracranial atherosclerosis with high-grade left ACA and left PCA narrowings.  Carotid Doppler right ICA 40 to 59% stenosis 2D Echo EF 55 to 60% LDL 90 HgbA1c 7.1 SCDs for VTE prophylaxis clopidogrel 75 mg daily prior to admission, now on aspirin 325 mg daily and clopidogrel 75 mg daily DAPT for 3 months and then Plavix alone given intracranial stenosis. Patient counseled to be compliant with his antithrombotic medications Ongoing aggressive stroke risk factor management Therapy recommendations: None Disposition: Home today  History of stroke 03/2016 admitted for slurred speech for 2 days.  MRI showed left BG/IC infarct.  MRA left PCA mild to moderate stenosis.  EF 60 to 65%.  Carotid Doppler right ICA 40 to 59% stenosis.  LDL 92, A1c 6.5.  Patient aspirin  changed to Plavix and added Lipitor 40 on discharge.  Intracranial stenosis 03/2016 MRA head showed left PCA mild to moderate stenosis On this admission MRA head showed left PCA high-grade stenosis and left ACA high-grade stenosis. Progression of intracranial stenosis On DAPT for 3 months and then Plavix alone.  Carotid stenosis 03/2016 carotid Doppler showed right ICA 40 to 59% stenosis On this admission again carotid Doppler showed right ICA 40 to 59% stenosis Stable so far Outpatient follow-up with vascular surgery for monitoring  Diabetes HgbA1c 7.1 goal < 7.0 Uncontrolled CBG monitoring SSI DM education and close PCP follow up  Hypertension Stable on the high end Permissive hypertension (OK if <220/120) for 24-48 hours post stroke and then gradually normalized within 5-7 days. Long term BP goal 130-150 given intracranial stenosis  Hyperlipidemia Home meds: None LDL 90, goal < 70 Now on Lipitor 80 Continue statin at discharge  Tobacco abuse Current smoker Smoking cessation counseling provided Pt is willing to quit  Other Stroke Risk Factors Advanced age Obesity, Body mass index is 33.36 kg/m.  Obstructive sleep apnea, on CPAP at home  Other Active Problems COPD Leukocytosis WBC 15.9 -> 16.8 CKD stage IIIa creatinine 1.34-> 1.61 Sepsis with appendix rupture status post surgery   Hospital day # 2  Neurology will sign off. Please call with questions. Pt will follow up with stroke clinic NP at [facility] in about 4 weeks. Thanks for the consult.   Rosalin Hawking, MD PhD Stroke Neurology 10/05/2020 8:51 PM  To contact Stroke Continuity provider, please refer to [email or web address] After hours, contact General Neurology

## 2020-10-05 NOTE — Progress Notes (Signed)
Family Medicine Teaching Service Daily Progress Note Intern Pager: [phone]  Patient name: Jonathan [name] Medical record number: [id number] Date of birth: [birth year] Age: 70 y.o. Gender: male  Primary Care Provider: Leeanne Rio, MD Consultants: Neurology, surgery Code Status: FullFull  Pt Overview and Major Events to Date:  10/02/2020: Admitted to [place] 10/03/2020: Appendectomy  Assessment and Plan: Mr. Virgina [name] is a 69 year old male who presented with altered mental status, aphasia under code stroke and then admitted for code sepsis.  PMH is significant for COPD, hypertension, hypertensive retinopathy, MDD, type 2 diabetes mellitus, HLD, right eye retinal detachment, renal mass, tobacco use, history of stroke, OSA  Sepsis, likely due to intra-abdominal infection I appendicitis with microperforation, s/p appendectomy POD#2 Hgb stable13.0. On Zosyn. Bcx does not show any growth for 2 days.  As per surgery, would need 5 days of antibiotics postoperatively and continuing with Zosyn during admission and switching to Augmentin at discharge. -- c/w Zosyn -- CBC, BMP in am -- c/w following up Bcx , Ucx   Subacute Stroke Neurology following and they consider patient to be medically stable for discharge today.  Would like to continue him on aspirin 325 mg and Plavix for 3 months and then Plavix alone. Carotid dopplers does not show RICA consistent with 40-59% stenosis. Left Carotid 1-39% stenosis.  --Stroke team following, appreciate recommendations --Start Aspirin 325 mg and 81 mg starting tomorrow --c/w Plavix 75 mg --c/w Lipitor 80 mg   HTN SBP 157-199, DBP-70-94. Home meds; Amlodipine 10 mg Qday, Lisinopril 40 mg. Received 5 mg Hydral once for BP 199/83.  -- Start Amlodipine 10 mg -- Start Lisinopril 40 mg -- Monitor closely   Incidental Otomastoiditis --c/w with antibiotics as above --Monitor closely    New EKG changes No chest pain today. Initial troponin 32 >26.  EKG with  new T wave inversion, possible U wave on prior since admission.  Echo normal ejection fraction with grade 2 diastolic. Repeat EKG this a.m. and would consult cardiology if needed --f/u EKG  -- Consider cardiology consult, if needed  FEN/GI: Carb modified diet PPx: Lovenox 40   Status is: Inpatient  Remains inpatient appropriate because:Inpatient level of care appropriate due to severity of illness  Dispo: The patient is from: Home              Anticipated d/c is to: Home              Patient currently is not medically stable to d/c.   Difficult to place patient No         Subjective:  Patient had high blood pressure last night and received 1 dose of 5 mg hydralazine. Patient evaluated at bedside this morning and denies any new complaints and states would like to have just a good night sleep.  Objective: Temp:  [97.8 F (36.6 C)-98.5 F (36.9 C)] 98.5 F (36.9 C) (06/13 1147) Pulse Rate:  [51-85] 85 (06/13 1147) Resp:  [18-19] 18 (06/13 1147) BP: (164-199)/(70-94) 179/78 (06/13 1329) SpO2:  [92 %-96 %] 96 % (06/13 1147) Weight:  [206 lb 11.2 oz (93.8 kg)] 206 lb 11.2 oz (93.8 kg) (06/13 0423) Physical Exam: General: Pleasant male lying comfortably in bed, NAD Cardiovascular: Regular rate and rhythm Respiratory: Expiratory wheeze present all over lung fields, breathing comfortably on room air Abdomen: Soft, nontender, nondistended, incision without any drainage or erythema or redness  Extremities: no edema appreciated  Laboratory: Recent Labs  Lab 10/03/20 0630 10/04/20 0055 10/05/20 0335  WBC  15.9* 16.8* 12.6*  HGB 14.8 13.2 13.0  HCT 44.8 39.8 40.3  PLT 158 169 183   Recent Labs  Lab 10/03/20 0630 10/04/20 0055 10/05/20 0335  NA 135 136 137  K 4.1 4.1 3.7  CL 107 108 113*  CO2 19* 21* 18*  BUN 15 22 21   CREATININE 1.34* 1.61* 1.40*  CALCIUM 8.0* 8.0* 7.8*  PROT 6.3* 6.1* 5.6*  BILITOT 1.1 0.5 0.8  ALKPHOS 73 66 56  ALT 28 23 40  AST 20 17 25    GLUCOSE 159* 177* 87      Imaging/Diagnostic Tests: VAS US CAROTID  Result Date: 10/04/2020 Carotid Arterial Duplex Study Patient Name:  Jonathan [name]  Date of Exam:   10/04/2020 Medical Rec #: [id number]       Accession #:    [id number] Date of Birth: 05-28-1950       Patient Gender: M Patient Age:   70Y Exam Location:  [facility] Procedure:      VAS US CAROTID Referring Phys: [id number] [place] --------------------------------------------------------------------------------  Indications:       Evolving subacute ischemia by MR. Altered mental status                    secondary to Sepsis (perforated appendix). Risk Factors:      Hypertension, hyperlipidemia, Diabetes, current smoker, prior                    CVA. Comparison Study:  Prior study done 04/04/16 indicating Right 40-59% ICA                    stenosis and 1-39% left ICA stenosis Performing Technologist: Sharion Dove RVS  Examination Guidelines: A complete evaluation includes B-mode imaging, spectral Doppler, color Doppler, and power Doppler as needed of all accessible portions of each vessel. Bilateral testing is considered an integral part of a complete examination. Limited examinations for reoccurring indications may be performed as noted.  Right Carotid Findings: +----------+--------+--------+--------+------------------+------------------+           PSV cm/sEDV cm/sStenosisPlaque DescriptionComments           +----------+--------+--------+--------+------------------+------------------+ CCA Prox  63      12                                intimal thickening +----------+--------+--------+--------+------------------+------------------+ CCA Distal80      17                                intimal thickening +----------+--------+--------+--------+------------------+------------------+ ICA Prox  158     42      40-59%  heterogenous                          +----------+--------+--------+--------+------------------+------------------+ ICA Distal87      22                                                   +----------+--------+--------+--------+------------------+------------------+ ECA       198     14                                                   +----------+--------+--------+--------+------------------+------------------+ +----------+--------+-------+--------+-------------------+  PSV cm/sEDV cmsDescribeArm Pressure (mmHG) +----------+--------+-------+--------+-------------------+ Subclavian88                                         +----------+--------+-------+--------+-------------------+ +---------+--------+--+--------+-+ VertebralPSV cm/s53EDV cm/s7 +---------+--------+--+--------+-+  Left Carotid Findings: +----------+--------+--------+--------+------------------+------------------+           PSV cm/sEDV cm/sStenosisPlaque DescriptionComments           +----------+--------+--------+--------+------------------+------------------+ CCA Prox  62      7                                 intimal thickening +----------+--------+--------+--------+------------------+------------------+ CCA Distal52      9                                 intimal thickening +----------+--------+--------+--------+------------------+------------------+ ICA Prox  121     27      1-39%                                        +----------+--------+--------+--------+------------------+------------------+ ICA Distal51      14                                tortuous           +----------+--------+--------+--------+------------------+------------------+ ECA       150     15                                                   +----------+--------+--------+--------+------------------+------------------+ +----------+--------+--------+--------+-------------------+           PSV cm/sEDV cm/sDescribeArm Pressure (mmHG)  +----------+--------+--------+--------+-------------------+ Subclavian127                                         +----------+--------+--------+--------+-------------------+ +---------+--------+--+--------+--+ VertebralPSV cm/s62EDV cm/s15 +---------+--------+--+--------+--+   Summary: Right Carotid: Velocities in the right ICA are consistent with a 40-59%                stenosis. Left Carotid: Velocities in the left ICA are consistent with a 1-39% stenosis. Vertebrals:  Bilateral vertebral arteries demonstrate antegrade flow. Subclavians: Normal flow hemodynamics were seen in bilateral subclavian              arteries. *See table(s) above for measurements and observations.     Preliminary      Irineo Gaulin, Meredith Staggers, MD 10/05/2020, 1:44 PM PGY-1, [place] Intern pager: [phone], text pages welcome

## 2020-10-05 NOTE — Progress Notes (Addendum)
[facility] Progress Note  2 Days Post-Op  Subjective: CC-  NAEO. Tolerating PO. Having bowel function. Pain controlled. Mobilizing in halls. PT/OT said no follow up. WBC 12.6 from 16.8  Objective: Vital signs in last 24 hours: Temp:  [97.8 F (36.6 C)-98.6 F (37 C)] 97.9 F (36.6 C) (06/13 0744) Pulse Rate:  [51-79] 79 (06/13 0744) Resp:  [18-20] 19 (06/13 0744) BP: (157-199)/(70-94) 170/77 (06/13 0744) SpO2:  [92 %-96 %] 96 % (06/13 0744) Weight:  [93.8 kg] 93.8 kg (06/13 0423) Last BM Date: 10/04/20  Intake/Output from previous day: 06/12 0701 - 06/13 0700 In: 4200.5 [P.O.:1700; I.V.:2380.6; IV Piggyback:120] Out: -  Intake/Output this shift: Total I/O In: 720 [P.O.:720] Out: 200 [Urine:200]  PE: Gen:  Alert, NAD, pleasant Pulm: rate and effort normal Abd: Soft, +BS, lap incisions C/D/I, nontender  Lab Results:  Recent Labs    10/04/20 0055 10/05/20 0335  WBC 16.8* 12.6*  HGB 13.2 13.0  HCT 39.8 40.3  PLT 169 183   BMET Recent Labs    10/04/20 0055 10/05/20 0335  NA 136 137  K 4.1 3.7  CL 108 113*  CO2 21* 18*  GLUCOSE 177* 87  BUN 22 21  CREATININE 1.61* 1.40*  CALCIUM 8.0* 7.8*   PT/INR Recent Labs    10/02/20 2217  LABPROT 13.1  INR 1.0   CMP     Component Value Date/Time   NA 137 10/05/2020 0335   NA 139 05/19/2020 1156   K 3.7 10/05/2020 0335   CL 113 (H) 10/05/2020 0335   CO2 18 (L) 10/05/2020 0335   GLUCOSE 87 10/05/2020 0335   BUN 21 10/05/2020 0335   BUN 16 05/19/2020 1156   CREATININE 1.40 (H) 10/05/2020 0335   CREATININE 1.22 10/31/2012 1117   CALCIUM 7.8 (L) 10/05/2020 0335   PROT 5.6 (L) 10/05/2020 0335   PROT 7.7 03/26/2020 1220   ALBUMIN 2.7 (L) 10/05/2020 0335   ALBUMIN 4.6 03/26/2020 1220   AST 25 10/05/2020 0335   ALT 40 10/05/2020 0335   ALKPHOS 56 10/05/2020 0335   BILITOT 0.8 10/05/2020 0335   BILITOT 0.2 03/26/2020 1220   GFRNONAA 54 (L) 10/05/2020 0335   GFRAA 62 05/19/2020 1156    Studies/Results: ECHOCARDIOGRAM COMPLETE  Result Date: 10/03/2020    ECHOCARDIOGRAM REPORT   Patient Name:   Jonathan [name] Date of Exam: 10/03/2020 Medical Rec #:  [id number]      Height:       66.0 in Accession #:    [id number]     Weight:       201.4 lb Date of Birth:  1950-05-30      BSA:          2.006 m Patient Age:    71 years       BP:           165/73 mmHg Patient Gender: M              HR:           55 bpm. Exam Location:  Inpatient Procedure: 2D Echo, Cardiac Doppler and Color Doppler Indications:    Stroke  History:        Patient has prior history of Echocardiogram examinations, most                 recent 04/04/2016. COPD; Risk Factors:Diabetes, Hypertension,                 Dyslipidemia and Sleep  Apnea.  Sonographer:    Clayton Lefort RDCS (AE) Referring Phys: [id number] [facility]  Sonographer Comments: Suboptimal subcostal window. IMPRESSIONS  1. Left ventricular ejection fraction, by estimation, is 55 to 60%. The left ventricle has normal function. The left ventricle has no regional wall motion abnormalities. There is mild concentric left ventricular hypertrophy. Left ventricular diastolic parameters are consistent with Grade II diastolic dysfunction (pseudonormalization). Elevated left atrial pressure.  2. Right ventricular systolic function is normal. The right ventricular size is normal.  3. Left atrial size was mildly dilated.  4. The mitral valve is normal in structure. Trivial mitral valve regurgitation. No evidence of mitral stenosis.  5. The aortic valve is tricuspid. There is mild thickening of the aortic valve. Aortic valve regurgitation is not visualized. Mild aortic valve sclerosis is present, with no evidence of aortic valve stenosis. Comparison(s): No significant change from prior study. Conclusion(s)/Recommendation(s): No intracardiac source of embolism detected on this transthoracic study. A transesophageal echocardiogram is recommended to exclude cardiac source of embolism if  clinically indicated. FINDINGS  Left Ventricle: Left ventricular ejection fraction, by estimation, is 55 to 60%. The left ventricle has normal function. The left ventricle has no regional wall motion abnormalities. The left ventricular internal cavity size was normal in size. There is  mild concentric left ventricular hypertrophy. Left ventricular diastolic parameters are consistent with Grade II diastolic dysfunction (pseudonormalization). Elevated left atrial pressure. Right Ventricle: The right ventricular size is normal. No increase in right ventricular wall thickness. Right ventricular systolic function is normal. Left Atrium: Left atrial size was mildly dilated. Right Atrium: Right atrial size was normal in size. Pericardium: There is no evidence of pericardial effusion. Mitral Valve: The mitral valve is normal in structure. There is mild thickening of the mitral valve leaflet(s). Mild mitral annular calcification. Trivial mitral valve regurgitation. No evidence of mitral valve stenosis. MV peak gradient, 3.8 mmHg. The mean mitral valve gradient is 1.0 mmHg. Tricuspid Valve: The tricuspid valve is normal in structure. Tricuspid valve regurgitation is trivial. Aortic Valve: The aortic valve is tricuspid. There is mild thickening of the aortic valve. Aortic valve regurgitation is not visualized. Mild aortic valve sclerosis is present, with no evidence of aortic valve stenosis. Aortic valve mean gradient measures 4.0 mmHg. Aortic valve peak gradient measures 8.1 mmHg. Aortic valve area, by VTI measures 3.40 cm. Pulmonic Valve: The pulmonic valve was normal in structure. Pulmonic valve regurgitation is trivial. Aorta: The aortic root and ascending aorta are structurally normal, with no evidence of dilitation. Venous: The inferior vena cava was not well visualized. IAS/Shunts: No atrial level shunt detected by color flow Doppler.  LEFT VENTRICLE PLAX 2D LVIDd:         4.20 cm  Diastology LVIDs:         2.70 cm  LV  e' medial:    4.00 cm/s LV PW:         1.70 cm  LV E/e' medial:  19.8 LV IVS:        1.80 cm  LV e' lateral:   4.06 cm/s LVOT diam:     2.30 cm  LV E/e' lateral: 19.5 LV SV:         97 LV SV Index:   48 LVOT Area:     4.15 cm  RIGHT VENTRICLE RV Basal diam:  3.10 cm RV S prime:     12.40 cm/s TAPSE (M-mode): 2.4 cm LEFT ATRIUM           Index  RIGHT ATRIUM           Index LA diam:      4.70 cm 2.34 cm/m  RA Area:     15.10 cm LA Vol (A2C): 58.9 ml 29.37 ml/m RA Volume:   34.60 ml  17.25 ml/m LA Vol (A4C): 72.8 ml 36.30 ml/m  AORTIC VALVE AV Area (Vmax):    3.36 cm AV Area (Vmean):   3.03 cm AV Area (VTI):     3.40 cm AV Vmax:           142.00 cm/s AV Vmean:          92.300 cm/s AV VTI:            0.285 m AV Peak Grad:      8.1 mmHg AV Mean Grad:      4.0 mmHg LVOT Vmax:         115.00 cm/s LVOT Vmean:        67.300 cm/s LVOT VTI:          0.233 m LVOT/AV VTI ratio: 0.82  AORTA Ao Root diam: 3.50 cm Ao Asc diam:  3.30 cm MITRAL VALVE MV Area (PHT): 3.42 cm    SHUNTS MV Area VTI:   2.47 cm    Systemic VTI:  0.23 m MV Peak grad:  3.8 mmHg    Systemic Diam: 2.30 cm MV Mean grad:  1.0 mmHg MV Vmax:       0.98 m/s MV Vmean:      51.5 cm/s MV Decel Time: 222 msec MV E velocity: 79.00 cm/s MV A velocity: 75.90 cm/s MV E/A ratio:  1.04 Gwyndolyn Kaufman MD Electronically signed by Gwyndolyn Kaufman MD Signature Date/Time: 10/03/2020/5:37:40 PM    Final    VAS US CAROTID  Result Date: 10/04/2020 Carotid Arterial Duplex Study Patient Name:  Jonathan [name]  Date of Exam:   10/04/2020 Medical Rec #: [id number]       Accession #:    [id number] Date of Birth: August 07, 1950       Patient Gender: M Patient Age:   70Y Exam Location:  [facility] Procedure:      VAS US CAROTID Referring Phys: [id number] [place] --------------------------------------------------------------------------------  Indications:       Evolving subacute ischemia by MR. Altered mental status                    secondary to Sepsis  (perforated appendix). Risk Factors:      Hypertension, hyperlipidemia, Diabetes, current smoker, prior                    CVA. Comparison Study:  Prior study done 04/04/16 indicating Right 40-59% ICA                    stenosis and 1-39% left ICA stenosis Performing Technologist: Sharion Dove RVS  Examination Guidelines: A complete evaluation includes B-mode imaging, spectral Doppler, color Doppler, and power Doppler as needed of all accessible portions of each vessel. Bilateral testing is considered an integral part of a complete examination. Limited examinations for reoccurring indications may be performed as noted.  Right Carotid Findings: +----------+--------+--------+--------+------------------+------------------+           PSV cm/sEDV cm/sStenosisPlaque DescriptionComments           +----------+--------+--------+--------+------------------+------------------+ CCA Prox  63      12  intimal thickening +----------+--------+--------+--------+------------------+------------------+ CCA Distal80      17                                intimal thickening +----------+--------+--------+--------+------------------+------------------+ ICA Prox  158     42      40-59%  heterogenous                         +----------+--------+--------+--------+------------------+------------------+ ICA Distal87      22                                                   +----------+--------+--------+--------+------------------+------------------+ ECA       198     14                                                   +----------+--------+--------+--------+------------------+------------------+ +----------+--------+-------+--------+-------------------+           PSV cm/sEDV cmsDescribeArm Pressure (mmHG) +----------+--------+-------+--------+-------------------+ [id number]                                          +----------+--------+-------+--------+-------------------+ +---------+--------+--+--------+-+ VertebralPSV cm/s53EDV cm/s7 +---------+--------+--+--------+-+  Left Carotid Findings: +----------+--------+--------+--------+------------------+------------------+           PSV cm/sEDV cm/sStenosisPlaque DescriptionComments           +----------+--------+--------+--------+------------------+------------------+ CCA Prox  62      7                                 intimal thickening +----------+--------+--------+--------+------------------+------------------+ CCA Distal52      9                                 intimal thickening +----------+--------+--------+--------+------------------+------------------+ ICA Prox  121     27      1-39%                                        +----------+--------+--------+--------+------------------+------------------+ ICA Distal51      14                                tortuous           +----------+--------+--------+--------+------------------+------------------+ ECA       150     15                                                   +----------+--------+--------+--------+------------------+------------------+ +----------+--------+--------+--------+-------------------+           PSV cm/sEDV cm/sDescribeArm Pressure (mmHG) +----------+--------+--------+--------+-------------------+ Subclavian127                                         +----------+--------+--------+--------+-------------------+ +---------+--------+--+--------+--+  VertebralPSV cm/s62EDV cm/s15 +---------+--------+--+--------+--+   Summary: Right Carotid: Velocities in the right ICA are consistent with a 40-59%                stenosis. Left Carotid: Velocities in the left ICA are consistent with a 1-39% stenosis. Vertebrals:  Bilateral vertebral arteries demonstrate antegrade flow. Subclavians: Normal flow hemodynamics were seen in bilateral subclavian               arteries. *See table(s) above for measurements and observations.     Preliminary     Anti-infectives: Anti-infectives (From admission, onward)    Start     Dose/Rate Route Frequency Ordered Stop   10/03/20 0845  metroNIDAZOLE (FLAGYL) IVPB 500 mg  Status:  Discontinued        500 mg 100 mL/hr over 60 Minutes Intravenous Every 8 hours 10/03/20 0746 10/03/20 0749   10/03/20 0845  piperacillin-tazobactam (ZOSYN) IVPB 3.375 g        3.375 g 12.5 mL/hr over 240 Minutes Intravenous Every 8 hours 10/03/20 0758     10/03/20 0700  cefTRIAXone (ROCEPHIN) 2 g in sodium chloride 0.9 % 100 mL IVPB  Status:  Discontinued        2 g 200 mL/hr over 30 Minutes Intravenous Every 24 hours 10/03/20 0641 10/03/20 0749   10/03/20 0615  doxycycline (VIBRA-TABS) tablet 100 mg  Status:  Discontinued        100 mg Oral Every 12 hours 10/03/20 0524 10/03/20 0641   10/02/20 2345  vancomycin (VANCOREADY) IVPB 2000 mg/400 mL        2,000 mg 200 mL/hr over 120 Minutes Intravenous  Once 10/02/20 2333 10/03/20 0240   10/02/20 2345  ceFEPIme (MAXIPIME) 2 g in sodium chloride 0.9 % 100 mL IVPB        2 g 200 mL/hr over 30 Minutes Intravenous  Once 10/02/20 2333 10/03/20 0034        Assessment/Plan Acute perforated appendicitis S/p laparoscopic appendectomy 6/11 Dr. Rosendo Gros - POD#2 - Doing well from surgical standpoint. Tolerating diet, pain well controlled, mobilizing without issues. Ok for discharge when cleared by primary team. His creatinine did go up a small amount. He needs 5 days of antibiotics postoperatively (continue zosyn during admission, switch to augmentin at discharge). Discharge instructions and follow up info on AVS. Rx for pain medication sent to his pharmacy. Ok to restart plavix at discharge.   ID - maxipime/vancomycin 6/10, zosyn 6/11>> VTE - SCDs, lovenox FEN - reg diet Foley - none Follow up - [facility]  HTN HLD DM Hx CVA 3 years ago on plavix (last dose 6/8) COPD Tobacco  abuse AKI   LOS: 2 days    Jill Alexanders, [facility] 10/05/2020, 9:16 AM Please see Amion for pager number during day hours 7:00am-4:30pm

## 2020-10-05 NOTE — Progress Notes (Signed)
Physical Therapy Treatment Patient Details Name: Jonathan [name] MRN: [id number] DOB: [birth year] Today's Date: 10/05/2020    History of Present Illness 70 y.o. male presenting with altered mental status, aphasia and code sepsis initiated. Imaging showing  left frontal subcortical stroke, underlying chronic  lacunar infarcts at the left basal ganglia, right thalamus, and  right pons. Found to have appendicitis with Micro-Perforation, s/p laproscopic appendectomy 6/11. PMH is significant for COPD, HTN, hypertensive retinopathy, MDD, T2DM, HLD, right eye retinal detachment, renal mass, tobacco use, hx of stroke 2/2 cerebral A thrombosis, OSA    PT Comments    Pt was seen for mobility on walker on the hallway, with pt having controlled pulse and sats for the entire trip.  Follow along with him to increase standing endurance and monitor cardiopulm exertion of the task. PT is still expecting him to go home without followup given his control of walker and awareness of his COPD.  See as planned for acute PT goals below.   Follow Up Recommendations  No PT follow up     Equipment Recommendations  None recommended by PT    Recommendations for Other Services       Precautions / Restrictions Precautions Precautions: None Precaution Comments: Watch vitals; per RN had a bout with bradycardia earlier Restrictions Weight Bearing Restrictions: No    Mobility  Bed Mobility Overal bed mobility: Needs Assistance Bed Mobility: Supine to Sit     Supine to sit: Min guard     General bed mobility comments: bedrails and cues for OOB    Transfers Overall transfer level: Modified independent Equipment used: Rolling walker (2 wheeled);1 person hand held assist             General transfer comment: stood with cued hand placemetn  Ambulation/Gait Ambulation/Gait assistance: Min guard Gait Distance (Feet): 250 Feet Assistive device: Rolling walker (2 wheeled) Gait Pattern/deviations:  Step-through pattern;Decreased stride length;Wide base of support Gait velocity: Slow Gait velocity interpretation: <1.31 ft/sec, indicative of household ambulator General Gait Details: SOB at times but sats were 98-100% on monitor   Stairs             Wheelchair Mobility    Modified Rankin (Stroke Patients Only)       Balance Overall balance assessment:  (slow pace, controlled balance)                                          Cognition Arousal/Alertness: Awake/alert Behavior During Therapy: WFL for tasks assessed/performed Overall Cognitive Status: Within Functional Limits for tasks assessed                                        Exercises Other Exercises Other Exercises: LE stregnth 4 to5    General Comments General comments (skin integrity, edema, etc.): pt was able to walk on the hallway with controlled vitals, nosigns of desaturation or fall      Pertinent Vitals/Pain Pain Assessment: No/denies pain    Home Living                      Prior Function            PT Goals (current goals can now be found in the care plan section) Acute Rehab PT Goals Patient Stated  Goal: get home Progress towards PT goals: Progressing toward goals    Frequency    Min 4X/week      PT Plan Current plan remains appropriate    Co-evaluation              AM-PAC PT "6 Clicks" Mobility   Outcome Measure  Help needed turning from your back to your side while in a flat bed without using bedrails?: A Little Help needed moving from lying on your back to sitting on the side of a flat bed without using bedrails?: A Little Help needed moving to and from a bed to a chair (including a wheelchair)?: A Little Help needed standing up from a chair using your arms (e.g., wheelchair or bedside chair)?: A Little Help needed to walk in hospital room?: A Little Help needed climbing 3-5 steps with a railing? : A Little 6 Click Score:  18    End of Session Equipment Utilized During Treatment: Gait belt Activity Tolerance: Patient tolerated treatment well Patient left: with call bell/phone within reach;in chair;with chair alarm set Nurse Communication: Mobility status PT Visit Diagnosis: Other abnormalities of gait and mobility (R26.89)     Time: 1643-5391 PT Time Calculation (min) (ACUTE ONLY): 27 min  Charges:  $Gait Training: 8-22 mins $Therapeutic Activity: 8-22 mins                Ramond Dial 10/05/2020, 3:21 PM  Mee Hives, PT MS Acute Rehab Dept. Number: [facility]

## 2020-10-05 NOTE — Progress Notes (Signed)
Plan of care reviewed. Pt has been progressing. Alert and oriented x 4. Neuro intact. Denied pain. Ambulated independently. Hemodynamics stable. SB on monitor, HR 48-64. BP 164/87- 171/87 mmHg. He remained afebrile. SPO2 92-95 on room air. No respiratory distress. Incision on abdomen dry and clean. No acute distress noted. We will continue to monitor.   Kennyth Lose, RN

## 2020-10-05 NOTE — Progress Notes (Signed)
BP 199/83 compared both arms were similar. Rechecked and confirmed the same with manual BP. Dr. Oleh Genin notified. Order received for Hydralazine 5 mg IV once and Novas 10 mg PO daily. Pt denied pain , no headache or feeling dizzy. We will monitor.   Jonathan Lose, RN

## 2020-10-05 NOTE — Discharge Summary (Addendum)
[facility] Discharge Summary  Patient name: Jonathan [name] Medical record number: [id number] Date of birth: [birth year] Age: 70 y.o. Gender: male Date of Admission: 10/02/2020  Date of Discharge: 10/05/2020 Admitting Physician: Lyndee Hensen, DO  Primary Care Provider: Leeanne Rio, MD Consultants: Neurology, Surgery  Indication for Hospitalization: Sepsis from appendicitis with micro-perforation I s/p appendectomy  Discharge Diagnoses/Problem List:  Sepsis from appendicitis with microperforation s/p appendectomy Subacute CVA Hypertension Right mastoid and middle ear effusion Type 2 diabetes mellitus Renal mass  Disposition: Home  Discharge Condition: Stable  Discharge Exam:  General: Pleasant male lying comfortably in bed, NAD Cardiovascular: Regular rate and rhythm Respiratory: Expiratory wheeze present on lower lung fields, breathing comfortably on room air Abdomen: Soft, nontender, nondistended, incision without any drainage or erythema or redness Extremities: No edema appreciated Psych: Good mood and affect  Brief Hospital Course:  Jonathan [name] is a 70 y.o. male presenting with altered mental status, aphasia and code sepsis initiated . PMH is significant for COPD, HTN, hypertensive retinopathy, MDD, T2DM, HLD, right eye retinal detachment, renal mass, tobacco use, hx of stroke 2/2 cerebral A thrombosis, OSA   Sepsis from appendicitis with Micro-perforation  S/p appendectomy  Patient admitted with sepsis of unknown origin and started on Vancomycin and CTX. Patient was then found to have appendicitis with micro perforation on  CT abd/pelvis with elevated lactate. Surgery was consulted and antibiotics were transitioned to Vanc and Zosyn and patient underwent appendectomy on 6/11.  Continued on Zosyn while inpatient and then changed to Augmentin at discharge.  Subacute CVA Patient initially presented with mental status change and code  stroke was called. Neurology was consulted and CT head did not have any acute findings and no deficits on exam. MRI was obtained and showed evidence of subacute ischemia and known 46mm mass. Patient was placed on DAPT with ASA and Clopidogrel, to be continued for 3 months followed by Plavix alone.  Hypertension Patient initially admitted with normotensive BP so home medications were held, especially given sepsis and after discovery of stroke on MRI. Blood pressures began to increase after permissive hypertension period and patient was given one dose of hydralazine and restarted on home meds.  Right mastoid and middle ear effusion MR brain had findings concerning for possible acute otomastoiditis with right mastoid and middle ear effusion. Patient without complaints but had thick yellow effusion. Infection treatment was covered by sepsis/appendicitis treatment.   Type 2 DM Patient home medications were held and patient was placed on sliding scale insulin for glucose control while hospitalized.   Renal mass MR liver on 05/07/20 showed solid enhancing endophytic lesion in right renal pelvis ~2.8cm. Will need outpatient follow-up.     Issues for Follow Up:  Renal mass - known mass follow up outpatient. Urology consult  Currently on DAPT with ASA 325 mg and Clopidogrel, to be continued for 3 months followed by Plavix alone 3.   Low dose CT scan outpatient  4.   PCP follow up in 1 week for COPD regular treatment and T2DM with recent HbA1c 7.1 5.   Neurology follow up in 1 month 6.   Follow up with PCP about R middle ear effusion 7.   Follow up with outpatient urology for renal mass  Significant Procedures: Appendectomy  Significant Labs and Imaging:  Recent Labs  Lab 10/03/20 0630 10/04/20 0055 10/05/20 0335  WBC 15.9* 16.8* 12.6*  HGB 14.8 13.2 13.0  HCT 44.8 39.8 40.3  PLT 158  169 183   Recent Labs  Lab 10/02/20 2217 10/02/20 2253 10/03/20 0630 10/04/20 0055 10/05/20 0335  NA  136 139 135 136 137  K 3.9 3.9 4.1 4.1 3.7  CL 105 104 107 108 113*  CO2 20*  --  19* 21* 18*  GLUCOSE 154* 155* 159* 177* 87  BUN $Re'13 14 15 22 21  'SHM$ CREATININE 1.42* 1.20 1.34* 1.61* 1.40*  CALCIUM 8.9  --  8.0* 8.0* 7.8*  ALKPHOS 99  --  73 66 56  AST 26  --  $R'20 17 25  'jQ$ ALT 34  --  28 23 40  ALBUMIN 3.8  --  3.2* 2.7* 2.7*     Results/Tests Pending at Time of Discharge: None  MR ANGIO HEAD WO CONTRAST  Result Date: 10/03/2020 CLINICAL DATA:  Neuro deficit. EXAM: MRA HEAD WITHOUT CONTRAST TECHNIQUE: Angiographic images of the Circle of Willis were acquired using MRA technique without intravenous contrast. COMPARISON:  No pertinent prior exam. FINDINGS: Anterior circulation: Atheromatous irregularity of the cavernous carotids and bilateral MCA/ACA branches. High-grade right A2 branch stenosis with flow gap. Negative for aneurysm or major branch occlusion. Posterior circulation: The vertebral and basilar arteries are smoothly contoured and diffusely patent. Mild vertebral tortuosity. Bilateral PCA atheromatous irregularity with high-grade left P2 segment stenosis causing flow gap. Anatomic variants: None significant Other: None. IMPRESSION: 1. No emergent finding. 2. Generalized intracranial atherosclerosis with high-grade ACA and left PCA narrowings. Electronically Signed   By: Monte Fantasia M.D.   On: 10/03/2020 07:36   MR BRAIN WO CONTRAST  Result Date: 10/03/2020 CLINICAL DATA:  Initial evaluation for acute delirium, aphasia. EXAM: MRI HEAD WITHOUT CONTRAST TECHNIQUE: Multiplanar, multiecho pulse sequences of the brain and surrounding structures were obtained without intravenous contrast. COMPARISON:  Previous CT from 10/02/2020 as well as previous MRI from 06/03/2017. FINDINGS: Brain: Cerebral volume within normal limits for age. Few scattered remote lacunar infarcts present about the left basal ganglia, right thalamus, and right pons. There is subtle linear diffusion abnormality involving  the subcortical white matter of the anterior left frontal lobe (series 5, image 80) evidence of associated T2/FLAIR signal abnormality without discernible ADC correlate. Finding favored to reflect changes of subacute ischemia. No associated hemorrhage or mass effect. No other diffusion abnormality to suggest acute or subacute ischemia elsewhere within the brain. Gray-white matter differentiation maintained. No encephalomalacia to suggest chronic cortical infarction. No evidence for acute or chronic intracranial hemorrhage. 8 mm well-circumscribed lesion at the right foramen magnum is seen, stable from previous. No other mass lesion, midline shift or mass effect. No hydrocephalus or extra-axial fluid collection. Pituitary gland suprasellar region within normal limits. Midline structures intact. Vascular: Major intracranial vascular flow voids are maintained. Skull and upper cervical spine: Craniocervical junction within normal limits. Degenerative spondylosis noted at C3-4 with mild spinal stenosis. Bone marrow signal intensity normal. No focal marrow replacing lesion. No scalp soft tissue abnormality. Sinuses/Orbits: Sequelae of prior bilateral ocular lens replacement. Scattered mucosal thickening noted within the ethmoidal air cells and maxillary sinuses. Right mastoid and middle ear effusion again noted. 8 mm retention cyst versus Tornwaldt cyst noted at the central nasopharynx. Other: Area of increased T2 signal intensity within the left parotid gland appears to demonstrate signal dropout on FLAIR sequence, consistent with fat. IMPRESSION: 1. Subtle linear diffusion abnormality involving the subcortical white matter of the anterior left frontal lobe, most consistent with evolving subacute ischemia. No associated hemorrhage or mass effect. 2. No other acute intracranial abnormality. Underlying chronic lacunar  infarcts at the left basal ganglia, right thalamus, and right pons. 3. 8 mm mass at the right foramen  magnum, stable as compared to previous exams dating back to 2017, suggesting a benign lesion. Differential considerations include a benign enhancing foramen magnum lesion, nerve sheath tumor, or possibly meningioma. 4. Right mastoid and middle ear effusion. Correlation with physical exam for possible acute otomastoiditis recommended. Electronically Signed   By: Jeannine Boga M.D.   On: 10/03/2020 03:48   CT ABDOMEN PELVIS W CONTRAST  Result Date: 10/03/2020 CLINICAL DATA:  Acute abdominal pain EXAM: CT ABDOMEN AND PELVIS WITH CONTRAST TECHNIQUE: Multidetector CT imaging of the abdomen and pelvis was performed using the standard protocol following bolus administration of intravenous contrast. CONTRAST:  109mL OMNIPAQUE IOHEXOL 300 MG/ML  SOLN COMPARISON:  Abdominal MRI 05/07/2020 FINDINGS: Lower chest: Prominent atheromatous plaque in the descending aorta. Coronary atherosclerosis. No acute finding Hepatobiliary: Hepatic steatosis.No evidence of biliary obstruction or stone. Pancreas: Unremarkable. Spleen: Unremarkable. Adrenals/Urinary Tract: Negative adrenals. Solid enhancing mass at the right renal pelvis measuring 3 cm, known from prior abdominal MRI. Tiny renal cortical cysts. No hydronephrosis. Unremarkable bladder. Stomach/Bowel: Thick walled distal appendix with adjacent fat stranding, 15 mm in outer wall diameter. The appendix is in expected location extending inferiorly from the cecum. A small bubble of extraluminal gas is present on coronal reformats. No bowel obstruction. Vascular/Lymphatic: No acute vascular abnormality. Prominent atheromatous plaque. No mass or adenopathy. Reproductive:No pathologic findings. Other: Shallow and fatty right inguinal hernia. Musculoskeletal: No acute abnormalities. Chronic L5 pars defects with L5-S1 anterolisthesis and biforaminal stenosis. L4-5 notable disc degeneration as well. Pending direct communication in [place]. IMPRESSION: 1. Acute appendicitis involving  the tip. Tiny extraluminal gas bubble compatible with perforation. No abscess or generalized pneumoperitoneum. 2. 3 cm right renal mass/neoplasm which is known from abdominal MRI earlier this year. Electronically Signed   By: Monte Fantasia M.D.   On: 10/03/2020 07:15   DG Chest Portable 1 View  Result Date: 10/02/2020 CLINICAL DATA:  Altered mental status EXAM: PORTABLE CHEST 1 VIEW COMPARISON:  11/18/2010 FINDINGS: Heart and mediastinal contours are within normal limits. No focal opacities or effusions. No acute bony abnormality. IMPRESSION: No active disease. Electronically Signed   By: Rolm Baptise M.D.   On: 10/02/2020 23:01   ECHOCARDIOGRAM COMPLETE  Result Date: 10/03/2020    ECHOCARDIOGRAM REPORT   Patient Name:   Jonathan [name] Date of Exam: 10/03/2020 Medical Rec #:  [id number]      Height:       66.0 in Accession #:    [id number]     Weight:       201.4 lb Date of Birth:  [birth year]      BSA:          2.006 m Patient Age:    78 years       BP:           165/73 mmHg Patient Gender: M              HR:           55 bpm. Exam Location:  Inpatient Procedure: 2D Echo, Cardiac Doppler and Color Doppler Indications:    Stroke  History:        Patient has prior history of Echocardiogram examinations, most                 recent 04/04/2016. COPD; Risk Factors:Diabetes, Hypertension,  Dyslipidemia and Sleep Apnea.  Sonographer:    Clayton Lefort RDCS (AE) Referring Phys: [id number] [facility]  Sonographer Comments: Suboptimal subcostal window. IMPRESSIONS  1. Left ventricular ejection fraction, by estimation, is 55 to 60%. The left ventricle has normal function. The left ventricle has no regional wall motion abnormalities. There is mild concentric left ventricular hypertrophy. Left ventricular diastolic parameters are consistent with Grade II diastolic dysfunction (pseudonormalization). Elevated left atrial pressure.  2. Right ventricular systolic function is normal. The right ventricular size  is normal.  3. Left atrial size was mildly dilated.  4. The mitral valve is normal in structure. Trivial mitral valve regurgitation. No evidence of mitral stenosis.  5. The aortic valve is tricuspid. There is mild thickening of the aortic valve. Aortic valve regurgitation is not visualized. Mild aortic valve sclerosis is present, with no evidence of aortic valve stenosis. Comparison(s): No significant change from prior study. Conclusion(s)/Recommendation(s): No intracardiac source of embolism detected on this transthoracic study. A transesophageal echocardiogram is recommended to exclude cardiac source of embolism if clinically indicated. FINDINGS  Left Ventricle: Left ventricular ejection fraction, by estimation, is 55 to 60%. The left ventricle has normal function. The left ventricle has no regional wall motion abnormalities. The left ventricular internal cavity size was normal in size. There is  mild concentric left ventricular hypertrophy. Left ventricular diastolic parameters are consistent with Grade II diastolic dysfunction (pseudonormalization). Elevated left atrial pressure. Right Ventricle: The right ventricular size is normal. No increase in right ventricular wall thickness. Right ventricular systolic function is normal. Left Atrium: Left atrial size was mildly dilated. Right Atrium: Right atrial size was normal in size. Pericardium: There is no evidence of pericardial effusion. Mitral Valve: The mitral valve is normal in structure. There is mild thickening of the mitral valve leaflet(s). Mild mitral annular calcification. Trivial mitral valve regurgitation. No evidence of mitral valve stenosis. MV peak gradient, 3.8 mmHg. The mean mitral valve gradient is 1.0 mmHg. Tricuspid Valve: The tricuspid valve is normal in structure. Tricuspid valve regurgitation is trivial. Aortic Valve: The aortic valve is tricuspid. There is mild thickening of the aortic valve. Aortic valve regurgitation is not visualized.  Mild aortic valve sclerosis is present, with no evidence of aortic valve stenosis. Aortic valve mean gradient measures 4.0 mmHg. Aortic valve peak gradient measures 8.1 mmHg. Aortic valve area, by VTI measures 3.40 cm. Pulmonic Valve: The pulmonic valve was normal in structure. Pulmonic valve regurgitation is trivial. Aorta: The aortic root and ascending aorta are structurally normal, with no evidence of dilitation. Venous: The inferior vena cava was not well visualized. IAS/Shunts: No atrial level shunt detected by color flow Doppler.  LEFT VENTRICLE PLAX 2D LVIDd:         4.20 cm  Diastology LVIDs:         2.70 cm  LV e' medial:    4.00 cm/s LV PW:         1.70 cm  LV E/e' medial:  19.8 LV IVS:        1.80 cm  LV e' lateral:   4.06 cm/s LVOT diam:     2.30 cm  LV E/e' lateral: 19.5 LV SV:         97 LV SV Index:   48 LVOT Area:     4.15 cm  RIGHT VENTRICLE RV Basal diam:  3.10 cm RV S prime:     12.40 cm/s TAPSE (M-mode): 2.4 cm LEFT ATRIUM  Index       RIGHT ATRIUM           Index LA diam:      4.70 cm 2.34 cm/m  RA Area:     15.10 cm LA Vol (A2C): 58.9 ml 29.37 ml/m RA Volume:   34.60 ml  17.25 ml/m LA Vol (A4C): 72.8 ml 36.30 ml/m  AORTIC VALVE AV Area (Vmax):    3.36 cm AV Area (Vmean):   3.03 cm AV Area (VTI):     3.40 cm AV Vmax:           142.00 cm/s AV Vmean:          92.300 cm/s AV VTI:            0.285 m AV Peak Grad:      8.1 mmHg AV Mean Grad:      4.0 mmHg LVOT Vmax:         115.00 cm/s LVOT Vmean:        67.300 cm/s LVOT VTI:          0.233 m LVOT/AV VTI ratio: 0.82  AORTA Ao Root diam: 3.50 cm Ao Asc diam:  3.30 cm MITRAL VALVE MV Area (PHT): 3.42 cm    SHUNTS MV Area VTI:   2.47 cm    Systemic VTI:  0.23 m MV Peak grad:  3.8 mmHg    Systemic Diam: 2.30 cm MV Mean grad:  1.0 mmHg MV Vmax:       0.98 m/s MV Vmean:      51.5 cm/s MV Decel Time: 222 msec MV E velocity: 79.00 cm/s MV A velocity: 75.90 cm/s MV E/A ratio:  1.04 Gwyndolyn Kaufman MD Electronically signed by Gwyndolyn Kaufman MD Signature Date/Time: 10/03/2020/5:37:40 PM    Final    CT HEAD CODE STROKE WO CONTRAST  Result Date: 10/02/2020 CLINICAL DATA:  Code stroke. Initial evaluation for acute code stroke. EXAM: CT HEAD WITHOUT CONTRAST TECHNIQUE: Contiguous axial images were obtained from the base of the skull through the vertex without intravenous contrast. COMPARISON:  Prior MRI from 06/03/2017 FINDINGS: Brain: Generalized age-related cerebral atrophy with mild chronic small vessel ischemic disease. Small remote lacunar infarct at the right thalamus. No acute intracranial hemorrhage. No acute large vessel territory infarct. No mass lesion, midline shift or mass effect. No hydrocephalus or extra-axial fluid collection. Vascular: No convincing hyperdense vessel. Both MCAs are somewhat dense in appearance, although fairly symmetric in appearance. Scattered vascular calcifications noted within the carotid siphons. Skull: Scalp soft tissues and calvarium within normal limits. Sinuses/Orbits: Globes and orbital soft tissues demonstrate no acute finding. Mild mucosal thickening noted within the sphenoid ethmoidal and maxillary sinuses. Prominent right mastoid and middle ear effusion. Additional trace left mastoid effusion noted as well. Other: None. ASPECTS [facility] Stroke Program Early CT Score) - Ganglionic level infarction (caudate, lentiform nuclei, internal capsule, insula, M1-M3 cortex): 7 - Supraganglionic infarction (M4-M6 cortex): 3 Total score (0-10 with 10 being normal): 10 IMPRESSION: 1. No acute intracranial infarct or other abnormality. 2. ASPECTS is 10. 3. Age-related cerebral atrophy with mild chronic small vessel ischemic disease, with small remote right thalamic lacunar infarct. 4. Right mastoid and middle ear effusion. Correlation with physical exam recommended. These results were communicated to Dr. Lorrin Goodell At 10:39 pmon 6/10/2022by text page via the [facility] messaging system. Electronically Signed   By:  Jeannine Boga M.D.   On: 10/02/2020 22:47   VAS US CAROTID  Result Date: 10/04/2020 Carotid Arterial Duplex Study  Patient Name:  Jonathan [name]  Date of Exam:   10/04/2020 Medical Rec #: [id number]       Accession #:    [id number] Date of Birth: 1950-07-07       Patient Gender: M Patient Age:   70Y Exam Location:  [facility] Procedure:      VAS US CAROTID Referring Phys: [id number] [facility] --------------------------------------------------------------------------------  Indications:       Evolving subacute ischemia by MR. Altered mental status                    secondary to Sepsis (perforated appendix). Risk Factors:      Hypertension, hyperlipidemia, Diabetes, current smoker, prior                    CVA. Comparison Study:  Prior study done 04/04/16 indicating Right 40-59% ICA                    stenosis and 1-39% left ICA stenosis Performing Technologist: Sharion Dove RVS  Examination Guidelines: A complete evaluation includes B-mode imaging, spectral Doppler, color Doppler, and power Doppler as needed of all accessible portions of each vessel. Bilateral testing is considered an integral part of a complete examination. Limited examinations for reoccurring indications may be performed as noted.  Right Carotid Findings: +----------+--------+--------+--------+------------------+------------------+           PSV cm/sEDV cm/sStenosisPlaque DescriptionComments           +----------+--------+--------+--------+------------------+------------------+ CCA Prox  63      12                                intimal thickening +----------+--------+--------+--------+------------------+------------------+ CCA Distal80      17                                intimal thickening +----------+--------+--------+--------+------------------+------------------+ ICA Prox  158     42      40-59%  heterogenous                          +----------+--------+--------+--------+------------------+------------------+ ICA Distal87      22                                                   +----------+--------+--------+--------+------------------+------------------+ ECA       198     14                                                   +----------+--------+--------+--------+------------------+------------------+ +----------+--------+-------+--------+-------------------+           PSV cm/sEDV cmsDescribeArm Pressure (mmHG) +----------+--------+-------+--------+-------------------+ [id number]                                         +----------+--------+-------+--------+-------------------+ +---------+--------+--+--------+-+ VertebralPSV cm/s53EDV cm/s7 +---------+--------+--+--------+-+  Left Carotid Findings: +----------+--------+--------+--------+------------------+------------------+           PSV cm/sEDV cm/sStenosisPlaque DescriptionComments           +----------+--------+--------+--------+------------------+------------------+  CCA Prox  62      7                                 intimal thickening +----------+--------+--------+--------+------------------+------------------+ CCA Distal52      9                                 intimal thickening +----------+--------+--------+--------+------------------+------------------+ ICA Prox  121     27      1-39%                                        +----------+--------+--------+--------+------------------+------------------+ ICA Distal51      14                                tortuous           +----------+--------+--------+--------+------------------+------------------+ ECA       150     15                                                   +----------+--------+--------+--------+------------------+------------------+ +----------+--------+--------+--------+-------------------+           PSV cm/sEDV cm/sDescribeArm Pressure (mmHG)  +----------+--------+--------+--------+-------------------+ Subclavian127                                         +----------+--------+--------+--------+-------------------+ +---------+--------+--+--------+--+ VertebralPSV cm/s62EDV cm/s15 +---------+--------+--+--------+--+   Summary: Right Carotid: Velocities in the right ICA are consistent with a 40-59%                stenosis. Left Carotid: Velocities in the left ICA are consistent with a 1-39% stenosis. Vertebrals:  Bilateral vertebral arteries demonstrate antegrade flow. Subclavians: Normal flow hemodynamics were seen in bilateral subclavian              arteries. *See table(s) above for measurements and observations.     Preliminary      Discharge Medications:  Allergies as of 10/05/2020   No Known Allergies      Medication List     TAKE these medications    acetaminophen 500 MG tablet Commonly known as: TYLENOL Take 1,000 mg by mouth every 6 (six) hours as needed for moderate pain or headache.   amLODipine 10 MG tablet Commonly known as: NORVASC Take 1 tablet (10 mg total) by mouth daily.   amoxicillin-clavulanate 875-125 MG tablet Commonly known as: AUGMENTIN Take 1 tablet by mouth 2 (two) times daily for 3 days.   aspirin 325 MG EC tablet Take 1 tablet (325 mg total) by mouth daily. Start taking on: October 06, 2020   atorvastatin 80 MG tablet Commonly known as: LIPITOR Take 1 tablet (80 mg total) by mouth daily.   clopidogrel 75 MG tablet Commonly known as: PLAVIX Take 1 tablet (75 mg total) by mouth daily. Start taking on: October 06, 2020   fexofenadine 180 MG tablet Commonly known as: Allegra Allergy Take 1 tablet (180 mg total) by mouth daily. What changed:  when to  take this reasons to take this   fluticasone 50 MCG/ACT nasal spray Commonly known as: FLONASE Place 1 spray into both nostrils daily. What changed:  when to take this reasons to take this   lisinopril 40 MG tablet Commonly known  as: ZESTRIL Take 1 tablet (40 mg total) by mouth daily.   metFORMIN 1000 MG tablet Commonly known as: GLUCOPHAGE Take 1 tablet (1,000 mg total) by mouth 2 (two) times daily with a meal.   ONE TOUCH DELICA LANCING DEV Misc Check sugar once per day   OneTouch Delica Lancets 88Q Misc Check sugar once per day   OneTouch Verio test strip Generic drug: glucose blood Check sugar once per day   OneTouch Verio w/Device Kit Check blood sugar once per day   oxyCODONE 5 MG immediate release tablet Commonly known as: Oxy IR/ROXICODONE Take 1 tablet (5 mg total) by mouth every 6 (six) hours as needed for severe pain.        Discharge Instructions: Please refer to Patient Instructions section of EMR for full details.  Patient was counseled important signs and symptoms that should prompt return to medical care, changes in medications, dietary instructions, activity restrictions, and follow up appointments.   Follow-Up Appointments:  Follow-up [facility], [place]. Go on 10/27/2020.   Specialty: General Surgery Why: Your appointment is 10/27/20 at 9 am Please arrive 30 minutes prior to your appointment to check in and fill out paperwork. Engineer, civil (consulting) ID and Doctor, general practice information: [address] [place] [phone]        [facility]. Schedule an appointment as soon as possible for a visit in 1 month(s).   Specialty: Neurology Contact information: [address] [place] [phone]        Leeanne Rio, MD Follow up.   Specialty: Family Medicine Why: Please see Dr. Ouida Sills on June 21st @ 1:30 pm for your follow up appointment Contact information: [address] [place] [phone]                 Honor Junes, MD 10/05/2020, 3:00 PM PGY-1, [place]

## 2020-10-06 LAB — SURGICAL PATHOLOGY

## 2020-10-08 LAB — CULTURE, BLOOD (ROUTINE X 2)
Culture: NO GROWTH
Culture: NO GROWTH
Special Requests: ADEQUATE
Special Requests: ADEQUATE

## 2020-10-12 NOTE — Progress Notes (Addendum)
SUBJECTIVE:   CHIEF COMPLAINT / HPI:   Sepsis from appendicitis with Micro-perforation  S/p appendectomy Patient admitted with sepsis of unknown origin and started on Vancomycin and CTX. Patient was then found to have appendicitis with micro perforation on  CT abd/pelvis with elevated lactate. Surgery was consulted and antibiotics were transitioned to Vanc and Zosyn and patient underwent appendectomy on 6/11.  Continued on Zosyn while inpatient and then changed to Augmentin at discharge. -Done with Augmentin (was done 10/07/2020)   Subacute CVA Patient initially presented with mental status change and code stroke was called. Neurology was consulted and CT head did not have any acute findings and no deficits on exam. MRI was obtained and showed evidence of subacute ischemia and known 53mm mass. Patient was placed on DAPT with ASA and Clopidogrel, to be continued for 3 months followed by Plavix alone. -Patient is currently taking both   Hypertension Patient initially admitted with normotensive BP so home medications were held, especially given sepsis and after discovery of stroke on MRI. Blood pressures began to increase after permissive hypertension period and patient was given one dose of hydralazine and restarted on home meds. -Is taking Amlodipine 10mg  daily and ?Lisinopril 40mg  daily    Right mastoid and middle ear effusion MR brain had findings concerning for right mastoid and middle ear effusion. Patient without complaints but had thick yellow effusion. Infection treatment was covered by sepsis/appendicitis treatment. Right mastoid and middle ear effusion.  -Ear is about the same, he continues to have some drainage but it is better than it was before -He was supposed to follow up for that but is planning to be rescheduled to have it looked at.    Type 2 DM Patient home medications were held and patient was placed on sliding scale insulin for glucose control while hospitalized. -On  Metformin now, taking 1000mg  BID, is having nausea with metformin   Renal mass MR liver on 05/07/20 showed solid enhancing endophytic lesion in right renal pelvis ~2.8cm. Will need outpatient follow-up. -He has not called about the liver appt yet   Issues for Follow Up:  Renal mass - known mass follow up outpatient. Urology consult. Follow up with outpatient urology for renal mass Currently on DAPT with ASA 325 mg and Clopidogrel, to be continued for 3 months followed by Plavix alone 3.   Low dose CT scan outpatient - has been smoking since age 44 (total of 61 years); needs outpatient CT scan of chest.  4.   PCP follow up in 1 week for COPD regular treatment and T2DM with recent HbA1c 7.1 5.   Neurology follow up in 1 month 6.   Follow up with PCP about R middle ear effusion  PERTINENT  PMH / PSH:  Patient Active Problem List   Diagnosis Date Noted   Stopped smoking with greater than 40 pack year history 10/13/2020   Encounter for screening for malignant neoplasm of lung in current smoker with 30 pack year history or greater 10/13/2020   Liver mass 10/13/2020   Middle ear effusion, right 10/13/2020   AMS (altered mental status) 10/03/2020   Cerebral embolism with cerebral infarction 10/03/2020   Sepsis ([place])    Ruptured appendicitis    Kidney mass 05/19/2020   Aortic atherosclerosis ([place]) 03/28/2020   Abdominal fullness 03/28/2020   Ingrown toenail 01/09/2020   Need for immunization against influenza 03/23/2019   Complex sleep apnea syndrome 01/23/2019   Treatment-emergent central sleep apnea 01/23/2019   Severe  obstructive sleep apnea-hypopnea syndrome 01/23/2019   OSA and COPD overlap syndrome ([place]) 01/23/2019   Cerebrovascular accident (CVA) due to thrombosis of cerebral artery ([place]) 01/23/2019   Excessive daytime sleepiness 01/23/2019   History of stroke 04/04/2016   Type 2 diabetes mellitus ([place]) 04/01/2016   Epigastric hernia 12/01/2015   Allergic rhinitis 10/31/2012   HLD  (hyperlipidemia) 09/07/2011   OSA (obstructive sleep apnea) 06/17/2011   Insomnia 05/30/2011   Hearing loss 01/17/2011   Knee pain, bilateral 11/18/2010   H/O: asbestos exposure 11/18/2010   Inguinal hernia, right 11/07/2010   Essential hypertension 08/17/2010   Bradycardia 08/17/2010   Tobacco use disorder 08/17/2010    OBJECTIVE:   BP 132/73   Pulse (!) 54   Ht 5\' 6"  (1.676 m)   Wt 190 lb 3.2 oz (86.3 kg)   SpO2 97%   BMI 30.70 kg/m    Physical exam: General: Well-appearing, no acute distress Respiratory: CTA bilaterally, comfortable work of breathing on room air, no adventitious breath sounds appreciated Cardio: Regular rhythm, heart rate mildly bradycardic, no murmur  ASSESSMENT/PLAN:   Ruptured appendicitis Resolved, patient no longer taking antibiotics as he has completed his course.  Type 2 diabetes mellitus ([place]) Patient with history of diabetes, most recent A1c 7.1%. -Continue metformin 1000 mg twice daily -Patient instructed on dietary changes to help reduce side effect of nausea/upset stomach from metformin -Follow-up 3 months for repeat A1c  Encounter for screening for malignant neoplasm of lung in current smoker with 30 pack year history or greater -Patient scheduled for low-dose CT of chest for lung cancer screening  Kidney mass -Patient already scheduled with urology outpatient for his renal mass  Tobacco use disorder Patient has cut back from 1 pack of cigarettes daily down to half pack of cigarettes daily. -Encouraged further cessation the patient currently declines at this time -Patient scheduled for low-dose CT of chest  Liver mass Patient with liver mass appreciated on previous MRI of the liver, before then was seen on right upper quadrant ultrasound. -Patient referred to GI for further work-up for his mass  Middle ear effusion, right Patient with chronic effusion of his right ear. Patient has completed his antibiotics for this, most recently  3 days of Augmentin. -Patient has been seen by physician for this concern for, he was strongly encouraged to follow-up with this physician     Daisy Floro, [place]

## 2020-10-12 NOTE — Patient Instructions (Addendum)
Thank you for coming in to see [place] today! Please see below to review our plan for today's visit:  1. I have made a referral to "GI" for your Liver Mass and they should call you to schedule for this. If you haven't heard from them in 2 weeks please let us know! 2. Please don't miss appt with Urology for kidney mass.  3. Please reach out to your other doctor to follow up for your ear - call them today to get this scheduled! 4. You need to get your Low Dose CT scan of your chest for lung cancer screening.     Please call the clinic at [phone] if your symptoms worsen or you have any concerns. It was our pleasure to serve you!   Dr. Milus Banister [facility]

## 2020-10-13 ENCOUNTER — Other Ambulatory Visit: Payer: Self-pay

## 2020-10-13 ENCOUNTER — Ambulatory Visit (INDEPENDENT_AMBULATORY_CARE_PROVIDER_SITE_OTHER): Payer: Medicare Other | Admitting: Family Medicine

## 2020-10-13 ENCOUNTER — Encounter: Payer: Self-pay | Admitting: Family Medicine

## 2020-10-13 ENCOUNTER — Ambulatory Visit (INDEPENDENT_AMBULATORY_CARE_PROVIDER_SITE_OTHER): Payer: Medicare Other

## 2020-10-13 VITALS — BP 132/73 | HR 54 | Ht 66.0 in | Wt 190.2 lb

## 2020-10-13 DIAGNOSIS — H6591 Unspecified nonsuppurative otitis media, right ear: Secondary | ICD-10-CM | POA: Insufficient documentation

## 2020-10-13 DIAGNOSIS — Z87891 Personal history of nicotine dependence: Secondary | ICD-10-CM | POA: Insufficient documentation

## 2020-10-13 DIAGNOSIS — E11319 Type 2 diabetes mellitus with unspecified diabetic retinopathy without macular edema: Secondary | ICD-10-CM | POA: Diagnosis not present

## 2020-10-13 DIAGNOSIS — N2889 Other specified disorders of kidney and ureter: Secondary | ICD-10-CM | POA: Diagnosis not present

## 2020-10-13 DIAGNOSIS — K3532 Acute appendicitis with perforation and localized peritonitis, without abscess: Secondary | ICD-10-CM | POA: Diagnosis not present

## 2020-10-13 DIAGNOSIS — F17200 Nicotine dependence, unspecified, uncomplicated: Secondary | ICD-10-CM | POA: Diagnosis not present

## 2020-10-13 DIAGNOSIS — Z23 Encounter for immunization: Secondary | ICD-10-CM | POA: Diagnosis not present

## 2020-10-13 DIAGNOSIS — Z122 Encounter for screening for malignant neoplasm of respiratory organs: Secondary | ICD-10-CM

## 2020-10-13 DIAGNOSIS — R160 Hepatomegaly, not elsewhere classified: Secondary | ICD-10-CM | POA: Diagnosis not present

## 2020-10-13 NOTE — Assessment & Plan Note (Addendum)
Patient with chronic effusion of his right ear. Patient has completed his antibiotics for this, most recently 3 days of Augmentin. -Patient has been seen by physician for this concern for, he was strongly encouraged to follow-up with this physician

## 2020-10-13 NOTE — Assessment & Plan Note (Signed)
Patient has cut back from 1 pack of cigarettes daily down to half pack of cigarettes daily. -Encouraged further cessation the patient currently declines at this time -Patient scheduled for low-dose CT of chest

## 2020-10-13 NOTE — Assessment & Plan Note (Signed)
Resolved, patient no longer taking antibiotics as he has completed his course.

## 2020-10-13 NOTE — Assessment & Plan Note (Signed)
Patient with liver mass appreciated on previous MRI of the liver, before then was seen on right upper quadrant ultrasound. -Patient referred to GI for further work-up for his mass

## 2020-10-13 NOTE — Assessment & Plan Note (Signed)
-  Patient scheduled for low-dose CT of chest for lung cancer screening

## 2020-10-13 NOTE — Assessment & Plan Note (Signed)
-  Patient already scheduled with urology outpatient for his renal mass

## 2020-10-13 NOTE — Assessment & Plan Note (Signed)
Patient with history of diabetes, most recent A1c 7.1%. -Continue metformin 1000 mg twice daily -Patient instructed on dietary changes to help reduce side effect of nausea/upset stomach from metformin -Follow-up 3 months for repeat A1c

## 2020-10-30 ENCOUNTER — Inpatient Hospital Stay: Admission: RE | Admit: 2020-10-30 | Payer: Medicare Other | Source: Ambulatory Visit

## 2020-11-18 ENCOUNTER — Ambulatory Visit: Payer: Medicare Other

## 2021-03-03 ENCOUNTER — Encounter: Payer: Self-pay | Admitting: Family Medicine

## 2021-03-23 ENCOUNTER — Ambulatory Visit (INDEPENDENT_AMBULATORY_CARE_PROVIDER_SITE_OTHER): Payer: Medicare Other

## 2021-03-23 ENCOUNTER — Ambulatory Visit (INDEPENDENT_AMBULATORY_CARE_PROVIDER_SITE_OTHER): Payer: Medicare Other | Admitting: Family Medicine

## 2021-03-23 ENCOUNTER — Other Ambulatory Visit: Payer: Self-pay

## 2021-03-23 ENCOUNTER — Telehealth: Payer: Self-pay

## 2021-03-23 VITALS — BP 175/88 | HR 52 | Wt 198.8 lb

## 2021-03-23 DIAGNOSIS — N2889 Other specified disorders of kidney and ureter: Secondary | ICD-10-CM

## 2021-03-23 DIAGNOSIS — Z1211 Encounter for screening for malignant neoplasm of colon: Secondary | ICD-10-CM | POA: Diagnosis not present

## 2021-03-23 DIAGNOSIS — Z23 Encounter for immunization: Secondary | ICD-10-CM

## 2021-03-23 DIAGNOSIS — E11319 Type 2 diabetes mellitus with unspecified diabetic retinopathy without macular edema: Secondary | ICD-10-CM | POA: Diagnosis not present

## 2021-03-23 DIAGNOSIS — L600 Ingrowing nail: Secondary | ICD-10-CM | POA: Diagnosis not present

## 2021-03-23 DIAGNOSIS — Z122 Encounter for screening for malignant neoplasm of respiratory organs: Secondary | ICD-10-CM | POA: Diagnosis not present

## 2021-03-23 LAB — POCT GLYCOSYLATED HEMOGLOBIN (HGB A1C): HbA1c, POC (controlled diabetic range): 6.7 % (ref 0.0–7.0)

## 2021-03-23 NOTE — Progress Notes (Signed)
  Date of Visit: 03/23/2021   SUBJECTIVE:   HPI:  Jonathan [name] presents today for follow up. It has been quite a while since I've seen him.  Diabetes - taking metformin 1000mg  twice daily, tolerating well  Renal mass - has known mass suspicious for renal cancer. Has not followed up with urology despite prior referral. He is agreeable to going to see them.  Hypertension - taking amlodipine 10mg  daily but has not taken for the last day or two.  Ingrown toenail - R great ingrown toenail for several months. No fevers or redness streaking up the foot.  Tobacco abuse - smoking 1/2ppd presently. Has smoked for >50 years. Due for lung cancer screening CT   OBJECTIVE:   BP (!) 175/88   Pulse (!) 52   Wt 198 lb 12.8 oz (90.2 kg)   SpO2 98%   BMI 32.09 kg/m  Gen: no acute distress, pleasant cooperative HEENT: normocephalic, atraumatic  Heart: regular rate and rhythm, no murmur Lungs: clear to auscultation bilaterally normal work of breathing  Neuro: alert grossly nonfocal speech normal Extremities: R great toe with mild erythema and tenderness on medial aspect of toenail. No purulence. 2+ dp pulses bilaterally. Normal monofilament testing bilaterally   ASSESSMENT/PLAN:   Health maintenance:  -COVID booster and flu shot given today -discussed shingirx and Tdap vaccines, to get at his pharmacy -agreeable to cologuard, ordered -foot exam done today, normal except for ingrown toenail   Diabetes  Well controlled with a1c 6.7. continue metformin.  Tobacco abuse Encouraged cessation, presently pre-contemplative Schedule for low dose CT lung cancer screening  Ingrown toenail Refer to podiatry, would benefit from regular foot care  Hypertension Elevated blood pressure but has not taken medications lately Advised compliance Rn blood pressure check in 1 week  Renal mass Re-refer to urology, stressed importance of this as he may have renal cancer.   [place]. Ardelia Mems, [place]

## 2021-03-23 NOTE — Telephone Encounter (Signed)
Called and LVM for patient concerning upcoming exam.  CT Chest Lung CA Screen Low Dose w/o CM 04/07/2021 [place] 1430 with arrival at 1415 Check in at admitting  .Ozella Almond, CMA

## 2021-03-23 NOTE — Patient Instructions (Addendum)
It was great to see you again today!  Diabetes looks great. A1c is 6.7  Referring to urologist. Please schedule an appointment with them - this is very important. You may have kidney cancer.  Referring to podiatry for your toenail  Let us know if you want to quit smoking.  Take vaccine prescriptions for shingles and Tdap (tetanus shot) to your pharmacy  Will have cologuard sent to your house for colon cancer screening  Setting up lung cancer screening CT scan  Be well, Dr. Ardelia Mems

## 2021-03-24 ENCOUNTER — Other Ambulatory Visit: Payer: Self-pay

## 2021-03-25 MED ORDER — AMLODIPINE BESYLATE 10 MG PO TABS: 10.0000 mg | ORAL_TABLET | Freq: Every day | ORAL | 3 refills | Status: DC

## 2021-04-01 ENCOUNTER — Ambulatory Visit (INDEPENDENT_AMBULATORY_CARE_PROVIDER_SITE_OTHER): Payer: Medicare Other

## 2021-04-01 ENCOUNTER — Other Ambulatory Visit: Payer: Self-pay

## 2021-04-01 VITALS — BP 152/72 | HR 64

## 2021-04-01 DIAGNOSIS — Z0130 Encounter for examination of blood pressure without abnormal findings: Secondary | ICD-10-CM

## 2021-04-01 NOTE — Progress Notes (Signed)
Patient here today for BP check.      Last BP was on 03/23/2021 and was 175/88.  BP today is 152/72 with a pulse of 64.    Checked BP in left arm with regular adult cuff.    Symptoms present: none.   Patient last took lisinopril and amlodipine this morning .  Routed note to PCP.      Talbot Grumbling, RN

## 2021-04-04 ENCOUNTER — Telehealth: Payer: Self-pay | Admitting: Family Medicine

## 2021-04-04 NOTE — Telephone Encounter (Signed)
BP from nursing visit noted. Patient needs follow up for medication adjustment. Nursing- please call patient and schedule with red team provider or pharmacy in next 2-3 weeks for BP medication, likely needs additional agent.  Thank you, Dorris Singh, MD  [facility] Medicine Teaching Service

## 2021-04-05 NOTE — Telephone Encounter (Signed)
Called patient and LVM for patient to call [facility] to make an appointment with Pharmacy or Red Team provider in 2 to 3 weeks.  Ozella Almond, [place]

## 2021-04-07 ENCOUNTER — Ambulatory Visit (HOSPITAL_COMMUNITY)
Admission: RE | Admit: 2021-04-07 | Discharge: 2021-04-07 | Disposition: A | Discharge status: Home / Self Care | Payer: Medicare Other | Source: Ambulatory Visit | Attending: Family Medicine | Admitting: Family Medicine

## 2021-04-07 ENCOUNTER — Other Ambulatory Visit: Payer: Self-pay

## 2021-04-07 DIAGNOSIS — Z122 Encounter for screening for malignant neoplasm of respiratory organs: Secondary | ICD-10-CM | POA: Diagnosis not present

## 2021-04-07 DIAGNOSIS — J439 Emphysema, unspecified: Secondary | ICD-10-CM | POA: Diagnosis not present

## 2021-04-07 DIAGNOSIS — K760 Fatty (change of) liver, not elsewhere classified: Secondary | ICD-10-CM | POA: Diagnosis not present

## 2021-04-07 DIAGNOSIS — F17210 Nicotine dependence, cigarettes, uncomplicated: Secondary | ICD-10-CM | POA: Diagnosis not present

## 2021-04-07 DIAGNOSIS — I700 Atherosclerosis of aorta: Secondary | ICD-10-CM | POA: Diagnosis not present

## 2021-04-08 ENCOUNTER — Ambulatory Visit (INDEPENDENT_AMBULATORY_CARE_PROVIDER_SITE_OTHER): Payer: Medicare Other | Admitting: Podiatry

## 2021-04-08 DIAGNOSIS — L600 Ingrowing nail: Secondary | ICD-10-CM | POA: Diagnosis not present

## 2021-04-08 MED ORDER — NEOMYCIN-POLYMYXIN-HC 1 % OT SOLN: OTIC | 0 refills | Status: DC

## 2021-04-08 NOTE — Patient Instructions (Signed)

## 2021-04-08 NOTE — Progress Notes (Signed)
°  Subjective:  Patient ID: Jonathan [name], male    DOB: [birth year],  MRN: [id number]  Chief Complaint  Patient presents with   Ingrown Toenail    Bilateral great toenails    70 y.o. male presents with the above complaint. History confirmed with patient.  This is a recurrent issue for him he would like himself permanently  Objective:  Physical Exam: warm, good capillary refill, no trophic changes or ulcerative lesions, normal DP and PT pulses, normal sensory exam, and bilateral hallux medial and lateral borders ingrowing borders with pain and no paronychia.  Assessment:   1. Ingrowing left great toenail   2. Ingrowing right great toenail      Plan:  Patient was evaluated and treated and all questions answered.    Ingrown Nail, bilaterally -Patient elects to proceed with minor surgery to remove ingrown toenail today. Consent reviewed and signed by patient. -Ingrown nail excised. See procedure note. -Educated on post-procedure care including soaking. Written instructions provided and reviewed. -Patient to follow up in 2 weeks for nail check.  Procedure: Excision of Ingrown Toenail Location: Bilateral 1st toe  medial and lateral  nail borders. Anesthesia: Lidocaine 1% plain; 1.5 mL and Marcaine 0.5% plain; 1.5 mL, digital block. Skin Prep: Betadine. Dressing: Silvadene; telfa; dry, sterile, compression dressing. Technique: Following skin prep, the toe was exsanguinated and a tourniquet was secured at the base of the toe. The affected nail border was freed, split with a nail splitter, and excised. Chemical matrixectomy was then performed with phenol and irrigated out with alcohol. The tourniquet was then removed and sterile dressing applied. Disposition: Patient tolerated procedure well. Patient to return in 2 weeks for follow-up.    Return in about 2 weeks (around 04/22/2021) for nail re-check.

## 2021-04-09 ENCOUNTER — Encounter: Payer: Self-pay | Admitting: Family Medicine

## 2021-04-09 ENCOUNTER — Telehealth: Payer: Self-pay | Admitting: Family Medicine

## 2021-04-09 NOTE — Telephone Encounter (Signed)
Called and informed lung cT scan results OK.

## 2021-04-22 ENCOUNTER — Ambulatory Visit (INDEPENDENT_AMBULATORY_CARE_PROVIDER_SITE_OTHER): Payer: Medicare Other | Admitting: Podiatry

## 2021-04-22 ENCOUNTER — Other Ambulatory Visit: Payer: Self-pay

## 2021-04-22 DIAGNOSIS — L600 Ingrowing nail: Secondary | ICD-10-CM

## 2021-04-26 NOTE — Progress Notes (Signed)
°  Subjective:  Patient ID: Jonathan [name], male    DOB: 1951-03-12,  MRN: [id number]  Chief Complaint  Patient presents with   Ingrown Toenail    nail check left    72 y.o. male presents with the above complaint. History confirmed with patient.  Doing much better he is no longer having much pain at all  Objective:  Physical Exam: warm, good capillary refill, no trophic changes or ulcerative lesions, normal DP and PT pulses, normal sensory exam, and bilateral hallux matricectomy sites are healing well  Assessment:   1. Ingrowing left great toenail   2. Ingrowing right great toenail      Plan:  Patient was evaluated and treated and all questions answered.    Ingrown Nail, bilaterally -Doing well he can discontinue soaks and ointments at this point and leave open to air  Return if symptoms worsen or fail to improve.

## 2021-05-05 ENCOUNTER — Encounter: Payer: Self-pay | Admitting: Family Medicine

## 2021-05-10 DIAGNOSIS — R948 Abnormal results of function studies of other organs and systems: Secondary | ICD-10-CM | POA: Diagnosis not present

## 2021-05-10 DIAGNOSIS — D49511 Neoplasm of unspecified behavior of right kidney: Secondary | ICD-10-CM | POA: Diagnosis not present

## 2021-05-12 DIAGNOSIS — U071 COVID-19: Secondary | ICD-10-CM | POA: Diagnosis not present

## 2021-06-02 ENCOUNTER — Encounter: Payer: Self-pay | Admitting: Family Medicine

## 2021-06-02 ENCOUNTER — Other Ambulatory Visit: Payer: Self-pay

## 2021-06-02 ENCOUNTER — Ambulatory Visit (INDEPENDENT_AMBULATORY_CARE_PROVIDER_SITE_OTHER): Payer: Medicare Other | Admitting: Family Medicine

## 2021-06-02 VITALS — BP 183/93 | HR 60 | Wt 200.0 lb

## 2021-06-02 DIAGNOSIS — I6330 Cerebral infarction due to thrombosis of unspecified cerebral artery: Secondary | ICD-10-CM

## 2021-06-02 DIAGNOSIS — F17200 Nicotine dependence, unspecified, uncomplicated: Secondary | ICD-10-CM | POA: Diagnosis not present

## 2021-06-02 DIAGNOSIS — N2889 Other specified disorders of kidney and ureter: Secondary | ICD-10-CM | POA: Diagnosis not present

## 2021-06-02 DIAGNOSIS — E11319 Type 2 diabetes mellitus with unspecified diabetic retinopathy without macular edema: Secondary | ICD-10-CM | POA: Diagnosis not present

## 2021-06-02 DIAGNOSIS — I10 Essential (primary) hypertension: Secondary | ICD-10-CM | POA: Diagnosis not present

## 2021-06-02 LAB — POCT GLYCOSYLATED HEMOGLOBIN (HGB A1C): HbA1c, POC (controlled diabetic range): 7.1 % — AB (ref 0.0–7.0)

## 2021-06-02 MED ORDER — 3 SERIES BP MONITOR/UPPER ARM DEVI: 0 refills | Status: AC

## 2021-06-02 NOTE — Patient Instructions (Signed)
Good to see you today - Thank you for coming in  Things we discussed today:  Blood pressure  - take your blood pressure medications every day at the same time 9 AM is good - Your goal blood pressure is less than 140/90.  Check your blood pressure several times a week.  If regularly higher than this please let me know - either with MyChart or leaving a phone message. Next visit please bring in your blood pressure cuff.    - check labs today   I will call you if your lab tests are not normal.  Otherwise we will discuss them at your next visit.  Make an appointment to see Dr Valentina Lucks about smoking   Please always bring your medication bottles  Come back to see me in 1 month

## 2021-06-02 NOTE — Progress Notes (Signed)
° ° °  SUBJECTIVE:   CHIEF COMPLAINT / HPI:   Here for surgical clearance for possible nephrectomy.  Has follow up CT in a few weeks and surgery likely not for 6 weeks  CVA In 2020.  He does not recall seeing a neurologist.  Takes plavix and lipitor about 5/7 days a week due to forgetting.  No new weakness  Tobacco Smoke regularly 1 ppd.  Would be interested in stopping especially with upcoming surgery.  Agrees to see Dr Valentina Lucks.  Hypertension Takes amlodipine and lisinopril (has the bottles) but forgets 1-2 times a week.  Does not have home blood pressure monitor.    Diabetes Takes metformin most days.  Does not check blood sugar     PERTINENT  PMH / PSH: history of CVA, tobacco use, diabetes, hypertension, hypocalcemia,   OBJECTIVE:   BP (!) 183/93    Pulse 60    Wt 200 lb (90.7 kg)    SpO2 96%    BMI 32.28 kg/m   Heart - Regular rate and rhythm.  No murmurs, gallops or rubs.    Lungs:  Normal respiratory effort, chest expands symmetrically. Lungs are clear to auscultation, no crackles or wheezes. No edema  ASSESSMENT/PLAN:   Tobacco use disorder Would be ideal if could stop or cut down prior to major surgery.  Seems like this is motivating him.  Will refer to Dr Valentina Lucks   Kidney mass Apparently scheduled for surgery and will need to be off Plavix.  Could use much better blood pressure control and smoking cessation prior.    Type 2 diabetes mellitus (H[facility]) A1c at goal.  Continue metformin   Essential hypertension BP Readings from Last 3 Encounters:  06/02/21 (!) 183/93  04/01/21 (!) 152/72  03/23/21 (!) 175/88   Not at goal.  Recommend he take his blood pressure medications as scheduled.  His wife will work with him.  Hopefully can get home blood pressure monitor.  Asked to let me know if not improving.  Needs to be better before surgery   Cerebrovascular accident (CVA) due to thrombosis of cerebral artery ([place]) On plavix and lipitor.  Will need to have plavix held prior  to surgery.  Will need better blood pressure control and regular statin use and hopefully smoking cessation prior      Lind Covert, MD [place]

## 2021-06-02 NOTE — Assessment & Plan Note (Signed)
Would be ideal if could stop or cut down prior to major surgery.  Seems like this is motivating him.  Will refer to Dr Valentina Lucks

## 2021-06-02 NOTE — Assessment & Plan Note (Signed)
On plavix and lipitor.  Will need to have plavix held prior to surgery.  Will need better blood pressure control and regular statin use and hopefully smoking cessation prior

## 2021-06-02 NOTE — Assessment & Plan Note (Signed)
Apparently scheduled for surgery and will need to be off Plavix.  Could use much better blood pressure control and smoking cessation prior.

## 2021-06-02 NOTE — Assessment & Plan Note (Signed)
BP Readings from Last 3 Encounters:  06/02/21 (!) 183/93  04/01/21 (!) 152/72  03/23/21 (!) 175/88   Not at goal.  Recommend he take his blood pressure medications as scheduled.  His wife will work with him.  Hopefully can get home blood pressure monitor.  Asked to let me know if not improving.  Needs to be better before surgery

## 2021-06-02 NOTE — Assessment & Plan Note (Signed)
A1c at goal.  Continue metformin. 

## 2021-06-03 ENCOUNTER — Encounter: Payer: Self-pay | Admitting: Family Medicine

## 2021-06-03 LAB — CMP14+EGFR
ALT: 28 IU/L (ref 0–44)
AST: 17 IU/L (ref 0–40)
Albumin/Globulin Ratio: 1.6 (ref 1.2–2.2)
Albumin: 4.7 g/dL (ref 3.8–4.8)
Alkaline Phosphatase: 135 IU/L — ABNORMAL HIGH (ref 44–121)
BUN/Creatinine Ratio: 12 (ref 10–24)
BUN: 13 mg/dL (ref 8–27)
Bilirubin Total: 0.4 mg/dL (ref 0.0–1.2)
CO2: 20 mmol/L (ref 20–29)
Calcium: 9.2 mg/dL (ref 8.6–10.2)
Chloride: 102 mmol/L (ref 96–106)
Creatinine, Ser: 1.12 mg/dL (ref 0.76–1.27)
Globulin, Total: 2.9 g/dL (ref 1.5–4.5)
Glucose: 96 mg/dL (ref 70–99)
Potassium: 4.5 mmol/L (ref 3.5–5.2)
Sodium: 138 mmol/L (ref 134–144)
Total Protein: 7.6 g/dL (ref 6.0–8.5)
eGFR: 71 mL/min/{1.73_m2} (ref >59)

## 2021-06-04 DIAGNOSIS — D49511 Neoplasm of unspecified behavior of right kidney: Secondary | ICD-10-CM | POA: Diagnosis not present

## 2021-06-04 DIAGNOSIS — N281 Cyst of kidney, acquired: Secondary | ICD-10-CM | POA: Diagnosis not present

## 2021-06-04 DIAGNOSIS — K760 Fatty (change of) liver, not elsewhere classified: Secondary | ICD-10-CM | POA: Diagnosis not present

## 2021-06-04 DIAGNOSIS — K4090 Unilateral inguinal hernia, without obstruction or gangrene, not specified as recurrent: Secondary | ICD-10-CM | POA: Diagnosis not present

## 2021-06-04 DIAGNOSIS — C641 Malignant neoplasm of right kidney, except renal pelvis: Secondary | ICD-10-CM | POA: Diagnosis not present

## 2021-06-08 ENCOUNTER — Ambulatory Visit: Payer: Medicare Other | Admitting: Pharmacist

## 2021-06-14 ENCOUNTER — Encounter: Payer: Self-pay | Admitting: Pharmacist

## 2021-06-14 ENCOUNTER — Ambulatory Visit (INDEPENDENT_AMBULATORY_CARE_PROVIDER_SITE_OTHER): Payer: Medicare Other | Admitting: Pharmacist

## 2021-06-14 ENCOUNTER — Other Ambulatory Visit: Payer: Self-pay

## 2021-06-14 DIAGNOSIS — I10 Essential (primary) hypertension: Secondary | ICD-10-CM

## 2021-06-14 DIAGNOSIS — E11319 Type 2 diabetes mellitus with unspecified diabetic retinopathy without macular edema: Secondary | ICD-10-CM | POA: Diagnosis not present

## 2021-06-14 DIAGNOSIS — F17200 Nicotine dependence, unspecified, uncomplicated: Secondary | ICD-10-CM

## 2021-06-14 MED ORDER — BUPROPION HCL ER (XL) 150 MG PO TB24: 150.0000 mg | ORAL_TABLET | Freq: Every day | ORAL | 11 refills | Status: DC

## 2021-06-14 MED ORDER — METFORMIN HCL ER 500 MG PO TB24: 500.0000 mg | ORAL_TABLET | Freq: Two times a day (BID) | ORAL | 11 refills | Status: DC

## 2021-06-14 MED ORDER — NICOTINE POLACRILEX 4 MG MT LOZG: 4.0000 mg | LOZENGE | OROMUCOSAL | 1 refills | Status: DC | PRN

## 2021-06-14 MED ORDER — HYDROCHLOROTHIAZIDE 12.5 MG PO TABS: 12.5000 mg | ORAL_TABLET | Freq: Every day | ORAL | 1 refills | Status: DC

## 2021-06-14 MED ORDER — NICOTINE 21 MG/24HR TD PT24: 21.0000 mg | MEDICATED_PATCH | Freq: Every day | TRANSDERMAL | 1 refills | Status: DC

## 2021-06-14 NOTE — Assessment & Plan Note (Signed)
Diabetes - with recent increase in diarrhea following dose increase in metformin to 2000mg  daily.  - Reduce dose to 1000mg  BID to 500mg  BID of Metformin XR

## 2021-06-14 NOTE — Patient Instructions (Addendum)
It was nice to see you today.  We are going to start  -Wellbutrin XL 150mg ; take once daily in the morning -Nicotine Patch 21mg ; if you have nightmares, please stop the patch at bed time -Nicotine Lozenge as needed to help with cravings (approximately 3-6 a day)  Continue to find ways to limit smoking triggers, such as: - keeping other smokers out of the house - taking the dogs out after meals - changing your coffee routine - going fly fishing  Feel free to call the 1-800-QUIT NOW line if you are in need of more support.  Hypertension We added another medication for your blood pressure: Hydrochlorothiazide 12.5mg  once daily in the morning.  Diabetes We also decreased your metformin XR to 500mg  twice a day.  Follow up in two weeks with Dr. Valentina Lucks (06/29/21)

## 2021-06-14 NOTE — Assessment & Plan Note (Signed)
Hypertension - longstanding, currently uncontrolled on Lisinopril 40mg  and amlodipine 10mg .  - Initiated  HCTZ 12.5mg  daily Patient educated on purpose, proper use and potential adverse effects of increased urination.  Following instruction patient verbalized understanding of treatment plan.

## 2021-06-14 NOTE — Progress Notes (Signed)
° °  S:  Patient arrives with his wife. Patient arrives for evaluation/assistance with tobacco dependence.   Patient was referred by Dr. Erin Hearing on 06/02/2021. He was last seen by his PCP, Dr. Ardelia Mems, on 03/23/21.  Age when started using tobacco at 71 years old. Became a daily occurrence at 15 years. Brand smoked [place]. Smoking a pack a day. He recently had COVID 19 and is about to lose a kidney due to cancer. He feels now is a good time to stop before surgery.   Endorses difficulty sometimes with breathing when it comes to going up stairs.  Says the surgery cannot be performed until his blood pressure is under control. Patient denies swelling.  Denies waking to smoke. Endorses sleeping through the night.  Fagerstrom Score Question Scoring Patient Score  How soon after waking do you smoke your first cigarette? <5 mins (3) 5-30 mins (2) 31-60 mins (1) >60 mins (0) 2  Do you find it difficult NOT to smoke in places where you shouldn't? Yes(1) No (0)   Which cigarette would you most hate to give up? First one in AM (1)  Any other one (0) 0     How many cigarettes do you smoke/day? 10 or less (0) 11-20 (1) 21-30 (2) >30 (3) 1  Do you smoke more during the first few hours after waking? Yes (1) No (0) 1  Do you smoke if you are so ill you cannot get out of bed? Yes (1) No (0) 1   Total Score   Score interpretation: low 1-2, low-to-moderate 3-4, moderate 5-7, high >7  Most recent quit attempt several years ago. Longest time ever been tobacco free 8 months.  Medications used in past cessation efforts include: patch (patient felt they were not helpful); Chantix (patient endorsed nightmares in the first week)  Rates IMPORTANCE of quitting tobacco on 1-10 scale of 10. Rates CONFIDENCE of quitting tobacco for a week on 1-10 scale of 8.  Most common triggers to use tobacco include; drinking coffee, after meals, driving in the car, boredom. Patient identified barriers to  quitting: other smokers, morning routine  Motivation to quit: recent cancer diagnosis  A/P: Tobacco use disorder with moderate nicotine dependence of 56 years duration in a patient who is good candidate for success because of current motivators.    -Initiated nicotine replacement tx with 21mg  patch and 4mg  lozenge as needed. Patient counseled on purpose, proper use, and potential adverse effects, including nightmares   -Initiated bupropion XL 150mg  once daily in the morning. Patient with no past medical history of seizures. Patient counseled on purpose, proper use, and potential adverse effects, including insomnia, and potential change in mood.   -Provided information on 1 800-QUIT NOW support program.   Hypertension - longstanding, currently uncontrolled on Lisinopril 40mg  and amlodipine 10mg .  - Initiated  HCTZ 12.5mg  daily Patient educated on purpose, proper use and potential adverse effects of increased urination.  Following instruction patient verbalized understanding of treatment plan.   Diabetes - with recent increase in diarrhea following dose increase in metformin to 2000mg  daily.  - Reduce dose to 1000mg  BID to 500mg  BID of Metformin XR     Written information provided.  F/U Rx Clinic Visit  in two weeks (06/29/21).  Total time in face-to-face counseling 45 minutes.  Patient seen with Gala Murdoch, PharmD Candidate.

## 2021-06-14 NOTE — Assessment & Plan Note (Signed)
Tobacco use disorder with moderate nicotine dependence of 56 years duration in a patient who is good candidate for success because of current motivators.    -Initiated nicotine replacement tx with 21mg  patch and 4mg  lozenge as needed. Patient counseled on purpose, proper use, and potential adverse effects, including nightmares   -Initiated bupropion XL 150mg  once daily in the morning. Patient with no past medical history of seizures. Patient counseled on purpose, proper use, and potential adverse effects, including insomnia, and potential change in mood.   -Provided information on 1 800-QUIT NOW support program.

## 2021-06-14 NOTE — Progress Notes (Signed)
Reveiwed: I agree with Dr. Koval's documentation and management. 

## 2021-06-15 ENCOUNTER — Telehealth: Payer: Self-pay

## 2021-06-15 NOTE — Telephone Encounter (Signed)
Returned call to patient/spouse.   Spouse clarified that the bupropion was available, and the patches were ordered for pick up later today/tomorrow.   She stated the nicotine lozenges were not covered by insurance and available only OTC which they could not afford.     I advised to contact [employer] Quit Now for starter supply of nicotine lozenges.  Patient's spouse verbalized understanding of treatment plan.  I plan to see patient on March  7th.

## 2021-06-15 NOTE — Telephone Encounter (Signed)
Pt wife calling to let Dr. Valentina Lucks know that she can not get the smoking lozenges OTC. Wife said he told her to let him know if she couldn't get them. She can be reached at [phone]. Ottis Stain, CMA

## 2021-06-22 ENCOUNTER — Telehealth: Payer: Self-pay

## 2021-06-22 NOTE — Telephone Encounter (Signed)
Received VM from [facility] with [facility] regarding surgical clearance for patient. Marlowe Kays is requesting returned phone call from Dr. Erin Hearing to further discuss this patient. Provider performing procedure has further questions regarding patient not being cleared for procedure.   Will forward to Dr. Erin Hearing.   Please return call to [place] at [phone] Ext [phone].  Talbot Grumbling, RN

## 2021-06-23 NOTE — Telephone Encounter (Signed)
Called number ?Sat through very long VM speeches ?No one answered ?Left message that patient has appointment on 3/7 and if his blood pressure is better controlled and smoking decreased I could fill out the preop evaluation form ? ? ?

## 2021-06-29 ENCOUNTER — Ambulatory Visit (INDEPENDENT_AMBULATORY_CARE_PROVIDER_SITE_OTHER): Payer: Medicare Other | Admitting: Pharmacist

## 2021-06-29 ENCOUNTER — Encounter: Payer: Self-pay | Admitting: Pharmacist

## 2021-06-29 ENCOUNTER — Other Ambulatory Visit: Payer: Self-pay

## 2021-06-29 DIAGNOSIS — I10 Essential (primary) hypertension: Secondary | ICD-10-CM | POA: Diagnosis not present

## 2021-06-29 DIAGNOSIS — F17200 Nicotine dependence, unspecified, uncomplicated: Secondary | ICD-10-CM | POA: Diagnosis not present

## 2021-06-29 MED ORDER — SPIRONOLACTONE 25 MG PO TABS: 25.0000 mg | ORAL_TABLET | Freq: Every day | ORAL | 3 refills | Status: DC

## 2021-06-29 NOTE — Patient Instructions (Addendum)
Nice to see you today! ? ?Today we discussed your goals for reducing smoking. Your goal is to reduce to 7 in two weeks and reduce your use to 3-4 by the end of March.  ? ?Continue all current blood pressure agents.  ? ?Start - Spironolactone '25mg'$  once daily.   ?

## 2021-06-29 NOTE — Progress Notes (Signed)
? ?S:  ?Patient arrives good spirits. Patient arrives for evaluation/assistance with tobacco dependence.   ?Patient was referred by Dr. Erin Hearing on 06/02/2021.  Patient was last seen by Primary Care Provider Dr. Ardelia Mems on 03/23/2021. He recently had COVID 19 and is about to lose a kidney due to cancer. He feels now is a good time to stop before surgery. He does report that the surgery cannot be done until his blood pressure is under control.  ? ?Age when started using tobacco on a daily basis: 71 years old. ?Brand smoked [place]. Number of cigarettes: 10/day. Estimated nicotine content per cigarette (mg) 10.  ?Estimated nicotine intake per day about 10 mg.   ? ?Since last visit, patient has reduced smoking from 20 cigarettes/day to 10 cigarettes/day. He reports that his breathing, sense of smell, and sense of taste is improved. He still reports some difficulty breathing when going on walks; he states that he uses his albuterol inhaler after walks to help with the chest tightness and shortness of breath.  ? ?Fagerstrom Score ?Question Scoring Patient Score  ?How soon after waking do you smoke your first cigarette? <5 mins (3) ?5-30 mins (2) ?31-60 mins (1) ?>60 mins (0) 2  ?Do you find it difficult NOT to smoke in places where you shouldn't? Yes(1) ?No (0)   ?Which cigarette would you most hate to give up? First one in AM (1) ? ?Any other one (0) 0 ? ? ?  ?How many cigarettes do you smoke/day? 10 or less (0) ?11-20 (1) ?21-30 (2) ?>30 (3) 0  ?Do you smoke more during the first few hours after waking? Yes (1) ?No (0) 1  ?Do you smoke if you are so ill you cannot get out of bed? Yes (1) ?No (0) 1  ? Total Score 4  ?Score interpretation: low 1-2, low-to-moderate 3-4, moderate 5-7, high >7 ? ?Most recent quit attempt was several years ago. ?Longest time ever been tobacco free 8 months. ? ?Medications used in past cessation efforts include: patch (patient felt they were not helpful); Chantix (patient endorsed nightmares  in the first week) ? ?Rates IMPORTANCE of quitting tobacco on 1-10 scale of 10. ?Rates CONFIDENCE of quitting tobacco on 1-10 scale of 8. ? ?Most common triggers to use tobacco include; drinking coffee, meals, boredom  ? ?Motivation to quit: recent cancer diagnosis, grandchildren and great-grandchildren  ? ?A/P: ?Tobacco use disorder with moderate nicotine dependence of 56 years duration in a patient who is good candidate for success because of his motivators.    ?- Unable to initiate nicotine replacement treatment patches and lozenges.  ?- Patient was prescribed bupropion XL '150mg'$  once daily in the morning at the last visit but reports that he is unable to afford it at this time. He says that he plans to call the 1-800-QUIT-NOW hotline, which may be able to give him free samples, but he has not had time yet.  ?Goal intake in 2 weeks - 7 per day or less ?Goal intake by end of month (3-4 weeks) - 4 or less per day. ? ?Hypertension - longstanding, continues to be uncontrolled on Lisinopril 40 mg daily, amlodipine 10 mg daily, and hydrochlorothiazide 12.5 mg daily. The patient is unable to proceed with surgery until blood pressure is lowered (goal <150/90) ?- Initiated spironolactone 25 mg daily. Patient educated on purpose, proper use and potential adverse effects of gynecomastia, hyperkalemia.  Following instruction patient verbalized understanding of treatment plan.  ? ?Diabetes - currently taking metformin 500  mg XR once daily, which had been decreased last visit due to diarrhea. Patient reports that he continues to have some loose stools but it has improved since decreasing the dose. - Continue '500mg'$  daily.  ? ? ?Written information provided. F/U Rx Clinic Visit in two weeks (07/13/2021). ? Total time in face-to-face counseling 42 minutes.  Patient seen with pharmacy resident Zenaida Deed, PharmD, and pharmacy student Earvin Hansen, PharmD Candidate. ? ? ?

## 2021-06-29 NOTE — Assessment & Plan Note (Signed)
Tobacco use disorder with moderate nicotine dependence of 56 years duration in a patient who is good candidate for success because of his motivators.    ?- Unable to initiate nicotine replacement treatment patches and lozenges.  ?- Patient was prescribed bupropion XL '150mg'$  once daily in the morning at the last visit but reports that he is unable to afford it at this time. He says that he plans to call the 1-800-QUIT-NOW hotline, which may be able to give him free samples, but he has not had time yet.  ?Goal intake in 2 weeks - 7 per day or less ?Goal intake by end of month (3-4 weeks) - 4 or less per day. ? ?

## 2021-06-29 NOTE — Assessment & Plan Note (Signed)
Hypertension - longstanding, continues to be uncontrolled on Lisinopril 40 mg daily, amlodipine 10 mg daily, and hydrochlorothiazide 12.5 mg daily. The patient is unable to proceed with surgery until blood pressure is lowered (goal <150/90) ?- Initiated spironolactone 25 mg daily. Patient educated on purpose, proper use and potential adverse effects of gynecomastia, hyperkalemia.  Following instruction patient verbalized understanding of treatment plan.  ? ? ?

## 2021-06-29 NOTE — Progress Notes (Signed)
Reviewed: I agree with Dr. Koval's documentation and management. 

## 2021-06-30 ENCOUNTER — Telehealth: Payer: Self-pay | Admitting: Pharmacist

## 2021-06-30 DIAGNOSIS — I10 Essential (primary) hypertension: Secondary | ICD-10-CM

## 2021-06-30 NOTE — Telephone Encounter (Signed)
Contacted patient's home and requested a lab test scheduled for next week following initiation of spironolactone.  ? ?BMET - order placed  ?Patient plans to come Tuesday AM. 3/14 ?

## 2021-07-06 ENCOUNTER — Other Ambulatory Visit: Payer: Self-pay

## 2021-07-06 ENCOUNTER — Other Ambulatory Visit: Payer: Medicare Other

## 2021-07-06 DIAGNOSIS — I10 Essential (primary) hypertension: Secondary | ICD-10-CM | POA: Diagnosis not present

## 2021-07-07 ENCOUNTER — Telehealth: Payer: Self-pay | Admitting: Family Medicine

## 2021-07-07 LAB — BASIC METABOLIC PANEL
BUN/Creatinine Ratio: 18 (ref 10–24)
BUN: 25 mg/dL (ref 8–27)
CO2: 23 mmol/L (ref 20–29)
Calcium: 9.5 mg/dL (ref 8.6–10.2)
Chloride: 100 mmol/L (ref 96–106)
Creatinine, Ser: 1.37 mg/dL — ABNORMAL HIGH (ref 0.76–1.27)
Glucose: 161 mg/dL — ABNORMAL HIGH (ref 70–99)
Potassium: 4.5 mmol/L (ref 3.5–5.2)
Sodium: 137 mmol/L (ref 134–144)
eGFR: 55 mL/min/{1.73_m2} — ABNORMAL LOW (ref >59)

## 2021-07-07 NOTE — Telephone Encounter (Signed)
Attempted to return call to [facility] at [facility]. No answer, left voicemail. Provided my cell phone number on voicemail offering for her to call me back directly to discuss. ? ?Patient has appointment next week with Dr. Valentina Lucks to follow up on blood pressure after starting spironolactone. ? ?Leeanne Rio, MD  ?

## 2021-07-07 NOTE — Telephone Encounter (Signed)
Marlowe Kays from [facility] is calling and would like to speak with Dr. Ardelia Mems. She originally sent a surgical clearance and was contacted by Dr. Erin Hearing saying patient had some things that needed to be readdressed in the office before surgery. Pt has upcoming appointment with Dr. Jorje Guild would like for Dr. Ardelia Mems to call to discuss if there has been any improvements - they are wanting to schedule him as soon as possible for surgery. ? ?The best contact number is [phone] ext [phone]. ?

## 2021-07-07 NOTE — Telephone Encounter (Signed)
Marlowe Kays called back, and I gave update on patient's status. Waiting to see what blood pressure is next week when he returns to see Dr. Valentina Lucks. ? ?Leeanne Rio, MD  ?

## 2021-07-08 ENCOUNTER — Telehealth: Payer: Self-pay | Admitting: Pharmacist

## 2021-07-08 NOTE — Telephone Encounter (Signed)
-----   Message from Zenia Resides, MD sent at 07/07/2021  8:36 AM EDT ----- ? ?----- Message ----- ?From: Interface, [facility] Lab Results In ?Sent: 07/07/2021   8:14 AM EDT ?To: Zenia Resides, MD ? ? ?

## 2021-07-08 NOTE — Telephone Encounter (Signed)
Shared the following:  ? ?Lab results were unchanged/remain in normal range - no concerns following lab draw.  ? ?Plan to seen in follow-up for Tobacco Cessation and blood pressure follow-up 3/21 ?

## 2021-07-08 NOTE — Telephone Encounter (Signed)
Noted and agree. 

## 2021-07-13 ENCOUNTER — Encounter: Payer: Self-pay | Admitting: Pharmacist

## 2021-07-13 ENCOUNTER — Other Ambulatory Visit: Payer: Self-pay

## 2021-07-13 ENCOUNTER — Ambulatory Visit (INDEPENDENT_AMBULATORY_CARE_PROVIDER_SITE_OTHER): Payer: Medicare Other | Admitting: Pharmacist

## 2021-07-13 DIAGNOSIS — I10 Essential (primary) hypertension: Secondary | ICD-10-CM

## 2021-07-13 DIAGNOSIS — F17200 Nicotine dependence, unspecified, uncomplicated: Secondary | ICD-10-CM

## 2021-07-13 MED ORDER — BUPROPION HCL ER (XL) 300 MG PO TB24: 300.0000 mg | ORAL_TABLET | Freq: Every morning | ORAL | 6 refills | Status: DC

## 2021-07-13 NOTE — Assessment & Plan Note (Signed)
Hypertension, recent poor control with improved control on spironolactone.  Pt's BP readings today were 119/65 and 135/70.  ?- Continue current blood pressure regimen of. Amlodipine '10mg'$ , HCTZ 12.'5mg'$ , lisinopril '40mg'$  and spironolactone '25mg'$  all are daily dosing.  ?Results were communicated to Dr. Ardelia Mems and Dr. Erin Hearing. ?

## 2021-07-13 NOTE — Progress Notes (Signed)
? ?  S:  ?Patient arrives in good spirits without asssitance.   Patient arrives for evaluation/assistance with tobacco dependence.   ?Patient was referred and last seen by Primary Care Provider Dr. Erin Hearing on 06/02/21.  ? ?Pt reports he was unable to get patches at pharmacy and has not called the QUIT line. Pt has not been using them and has not used lozenges. Pt reports he is now down to half a pack a day. Pt reports he does count and knows that a pack lasts him 2 days. Reports his attitude has been the hardest barrier with quitting. Pt notices he has been more irritable. Pt reports daughter buys pack of cigarettes at same time as him and she will take his cigarettes when she runs out which has assisted him with cutting down. ? ?Pt reports worry regarding his blood pressure. Pt reports he has been taking new medication initiated last time: Spironolactone 25 mg daily. Pt reports seeing 166/80 at home and this was multiple readings. He reports no side effects with medication changes. ? ?Age when started using tobacco on a daily basis 74. ?Number of cigarettes/day 10.  ? ?Most recent quit attempt ongoing ?Longest time ever been tobacco free 8 months. ? ?Medications used in past cessation efforts include: varenicline. Pt reports previously having nightmares when using varenicline. ?Pt currently using Bupropion 150 mg daily and feels that it is working for him. ? ?Most common triggers to use tobacco include; habit and routine ? ?Motivation to quit: health (cancer diagnosis) ? ?A/P: ?Tobacco use disorder with moderate nicotine dependence of 56 years duration in a patient who is good candidate for success because of current motivators.    ?-Increased bupropion dose from 150 mg daily to 300 mg daily. Patient with no past medical history of seizures. Patient counseled on purpose, proper use, and potential adverse effects, including insomnia, and potential change in mood.  ?-Provided information on 1 800-QUIT NOW support  program. Pt counseled that this would be a good option to get 14 mg patches and 4 mg lozenges. ?-GOAL: Smoking 8 cigarettes per day by the end of the month.  ? ?Hypertension, recent poor control with improved control on spironolactone.  Pt's BP readings today were 119/65 and 135/70.  ?- Continue current blood pressure regimen of. Amlodipine '10mg'$ , HCTZ 12.'5mg'$ , lisinopril '40mg'$  and spironolactone '25mg'$  all are daily dosing.  ?Results were communicated to Dr. Ardelia Mems and Dr. Erin Hearing. ? ?Written information provided.  F/U phone call in 4-6 weeks. No scheduled appointment.    ?Total time in face-to-face counseling 32 minutes.  Patient seen with Earvin Hansen PharmD Candidate and Zenaida Deed, PharmD, PGY 1 pharmacy resident.  ? ?

## 2021-07-13 NOTE — Patient Instructions (Addendum)
Nice to see you today! ? ?Today we increased your Bupropion (Wellbutrin) dose to 300 mg once daily. You can take 2 tablets of the 150 mg until you run out. The 300 mg strength will be sent to your pharmacy. ? ?Our goal is to decrease the amount of cigarettes per day to 8 by the end of the month. Great progress so far! ?Consider calling 1 800-QUIT NOW for 14 mg patches and/or 4 mg lozenges.  ? ?Your blood pressure results have been given to Dr. Ardelia Mems and Dr. Erin Hearing and they will follow up with you. ? ? ?

## 2021-07-13 NOTE — Progress Notes (Signed)
Reviewed: I agree with Dr. Koval's documentation and management. 

## 2021-07-13 NOTE — Assessment & Plan Note (Addendum)
Tobacco use disorder with moderate nicotine dependence of 56 years duration in a patient who is good candidate for success because of current motivators.    ?-Increased bupropion dose from 150 mg daily to 300 mg daily. Patient with no past medical history of seizures. Patient counseled on purpose, proper use, and potential adverse effects, including insomnia, and potential change in mood.  ?-Provided information on 1 800-QUIT NOW support program. Pt counseled that this would be a good option to get 14 mg patches and 4 mg lozenges. ?-GOAL: Smoking 8 cigarettes per day by the end of the month.  ? ?

## 2021-08-17 ENCOUNTER — Ambulatory Visit (INDEPENDENT_AMBULATORY_CARE_PROVIDER_SITE_OTHER): Payer: Medicare Other | Admitting: Family Medicine

## 2021-08-17 ENCOUNTER — Other Ambulatory Visit: Payer: Self-pay

## 2021-08-17 ENCOUNTER — Encounter: Payer: Self-pay | Admitting: Family Medicine

## 2021-08-17 VITALS — BP 129/56 | HR 63 | Wt 196.4 lb

## 2021-08-17 DIAGNOSIS — G4733 Obstructive sleep apnea (adult) (pediatric): Secondary | ICD-10-CM

## 2021-08-17 DIAGNOSIS — N2889 Other specified disorders of kidney and ureter: Secondary | ICD-10-CM | POA: Diagnosis not present

## 2021-08-17 DIAGNOSIS — R9431 Abnormal electrocardiogram [ECG] [EKG]: Secondary | ICD-10-CM

## 2021-08-17 NOTE — Patient Instructions (Addendum)
It was great to see you again today! ? ?You need to see a cardiologist prior to surgery ?Checking some labs today ?Ordering sleep study ? ?Follow up with me in 3 months, sooner if needed ? ?Be well, ?Dr. Ardelia Mems  ?

## 2021-08-17 NOTE — Progress Notes (Signed)
?  Date of Visit: 08/17/2021  ? ?SUBJECTIVE:  ? ?HPI: ? ?Jonathan [name] presents today for follow up and to discuss preoperative evaluation. ? ?Hypertension - currently taking lisinopril 7m daily, spironolactone 264mdaily, HCTZ 12.82m58maily, and amlodipine 65m40mily. Tolerating these medications well. ? ?Diabetes - no longer taking metformin due to GI upset. Checks CBGs and gets 120s in the morning. A1c was 7.1 last on 06/02/21. ? ?History of stroke - taking plavix 782mg4mly and atorvastatin 80mg 16my. Tolerating these well. ? ?Tobacco use disorder - smoking 1/2 ppd. Has smoked since 10 year77 of age. Has cut back significantly on his smoking. ? ?OSA - does not use CPAP presently. Has an old one. Would be willing to use one in the future but needs updated sleep study. ? ?Preop evaluation - planning to have nephrectomy done by urology for renal mass. Is able to climb stairs without getting shortness of breath or chest pain. Can walk around store without these symptoms as well. Denies prior bleeding issues or problems with anesthesia. ? ? ?OBJECTIVE:  ? ?BP (!) 129/56   Pulse 63   Wt 196 lb 6.4 oz (89.1 kg)   SpO2 96%   BMI 32.19 kg/m?  ?Gen: no acute distress, pleasant cooperative ?HEENT: normocephalic, atraumatic  ?Heart: regular rate and rhythm, no murmur ?Lungs: clear to auscultation bilaterally, normal work of breathing  ?Neuro: alert, speech normal, grossly nonfocal ?Ext: No appreciable lower extremity edema bilaterally  ? ?ASSESSMENT/PLAN:  ? ?Health maintenance:  ?-[facility] and will complete ? ?OSA ?Not on CPAP. Last sleep study years ago. Split night study ordered. ? ?Hypertension - well controlled on present regimen, continue ? ?Tobacco use disorder - congratulated efforts to cut back, encouraged to continue doing so ? ?Preoperative evaluation ?Can function at 4 METs without issues. Completed NSQIP risk calculator today which estimates patient  is at above average risk of renal failure but otherwise is average or below average risk for perioperative adverse outcomes. ?Check labs today - CBC, BMET, PT/INR ?EKG performed today due to history of hypertension, stroke, and cardiac risk factors (diabetes, smoking). Has some biphasic t waves in precordial leads, with TWI in lead I which are new. ?Overall given his cardiac risk factors and slightly abnormal EKG I would like him to see cardiology for evaluation prior to undergoing surgery. He is agreeable. Will place referral. ? ? ? ?FOLLOW UP: ?Follow up in 3 months with me for routine medical issues ?Referring to cardiology for preop eval ? ?[place]Ardelia Mems[place]one [place]ine ?

## 2021-08-18 LAB — CBC
Hematocrit: 46.8 % (ref 37.5–51.0)
Hemoglobin: 16.1 g/dL (ref 13.0–17.7)
MCH: 30.4 pg (ref 26.6–33.0)
MCHC: 34.4 g/dL (ref 31.5–35.7)
MCV: 88 fL (ref 79–97)
Platelets: 247 10*3/uL (ref 150–450)
RBC: 5.3 x10E6/uL (ref 4.14–5.80)
RDW: 13.4 % (ref 11.6–15.4)
WBC: 6.3 10*3/uL (ref 3.4–10.8)

## 2021-08-18 LAB — BASIC METABOLIC PANEL
BUN/Creatinine Ratio: 10 (ref 10–24)
BUN: 14 mg/dL (ref 8–27)
CO2: 20 mmol/L (ref 20–29)
Calcium: 9.2 mg/dL (ref 8.6–10.2)
Chloride: 104 mmol/L (ref 96–106)
Creatinine, Ser: 1.44 mg/dL — ABNORMAL HIGH (ref 0.76–1.27)
Glucose: 121 mg/dL — ABNORMAL HIGH (ref 70–99)
Potassium: 4.4 mmol/L (ref 3.5–5.2)
Sodium: 139 mmol/L (ref 134–144)
eGFR: 52 mL/min/{1.73_m2} — ABNORMAL LOW (ref >59)

## 2021-08-18 LAB — PROTIME-INR
INR: 1 (ref 0.9–1.2)
Prothrombin Time: 10.1 s (ref 9.1–12.0)

## 2021-09-03 ENCOUNTER — Encounter: Payer: Self-pay | Admitting: Family Medicine

## 2021-09-03 ENCOUNTER — Other Ambulatory Visit: Payer: Self-pay | Admitting: Family Medicine

## 2021-09-03 DIAGNOSIS — I10 Essential (primary) hypertension: Secondary | ICD-10-CM

## 2021-09-08 ENCOUNTER — Ambulatory Visit: Payer: Medicare Other | Admitting: Interventional Cardiology

## 2021-09-08 ENCOUNTER — Other Ambulatory Visit (HOSPITAL_COMMUNITY): Payer: Self-pay

## 2021-09-24 ENCOUNTER — Ambulatory Visit (HOSPITAL_BASED_OUTPATIENT_CLINIC_OR_DEPARTMENT_OTHER): Payer: Medicare Other | Attending: Family Medicine | Admitting: Internal Medicine

## 2021-09-24 ENCOUNTER — Encounter (INDEPENDENT_AMBULATORY_CARE_PROVIDER_SITE_OTHER): Payer: Self-pay

## 2021-09-24 VITALS — Ht 66.0 in | Wt 198.0 lb

## 2021-09-24 DIAGNOSIS — G4733 Obstructive sleep apnea (adult) (pediatric): Secondary | ICD-10-CM | POA: Insufficient documentation

## 2021-09-29 NOTE — Progress Notes (Unsigned)
Cardiology Office Note:    Date:  09/29/2021   ID:  Jonathan [name], DOB Jan 17, 1951, MRN [id number]  PCP:  Leeanne Rio, MD   [facility] Providers Cardiologist:  None {  Referring MD: Leeanne Rio, MD    History of Present Illness:    Jonathan [name] is a 71 y.o. male with a hx of HTN, COPD, tobacco abuse DMII, anxiety, depression, HLD and prior CVA who was referred by Dr. Ardelia Mems for abnormal ECG and pre-op evaluation.  Patient seen by Dr. Ardelia Mems on 08/17/21. Note reviewed. He is planned to have nephrectomy done of a renal mass. He was not having any significant CV symptoms at that time, however, ECG showed biphasic T waves in precordial leads and TWI in lead I. Given his risk factors, he was recommended to follow-up with Cardiology for further evaluation.  Past Medical History:  Diagnosis Date   Anxiety    Arthritis    COPD (chronic obstructive pulmonary disease) ([place])    Depression    Diabetes mellitus without complication ([place])    Diarrhea    History of kidney stones    HTN (hypertension)    Hyperlipidemia    Hypertensive retinopathy    OU   Pneumonia    Retinal detachment    Rheg. RD OD   Sleep apnea    does not use cpap   Stroke [facility])    denies any deficits    Past Surgical History:  Procedure Laterality Date   CATARACT EXTRACTION Bilateral    Dr. Sherrilee Gilles     EYE SURGERY Bilateral    Cat Sx OU; RD repair OD   GAS INSERTION Right 03/13/2019   Procedure: INSERTION OF GAS (C3F8) RIGHT EYE;  Surgeon: [name], MD;  Location: [place];  Service: Ophthalmology;  Laterality: Right;   LAPAROSCOPIC APPENDECTOMY N/A 10/03/2020   Procedure: APPENDECTOMY LAPAROSCOPIC;  Surgeon: Ralene Ok, MD;  Location: [place];  Service: General;  Laterality: N/A;   LASER PHOTO ABLATION Right 03/13/2019   Procedure: LASER PHOTO  ABLATION RIGHT EYE;  Surgeon: [name], MD;  Location: [place];  Service: Ophthalmology;  Laterality: Right;    none     PARS PLANA VITRECTOMY Right 03/13/2019   Procedure: 25 GAUGE PARS PLANA VITRECTOMY WITH  INTRAOCULAR LENSE EXPLANTATION RIGHT EYE.;  Surgeon: [name], MD;  Location: [place];  Service: Ophthalmology;  Laterality: Right;   PARS PLANA VITRECTOMY Right 06/27/2019   Procedure: PARS PLANA VITRECTOMY WITH 25 GAUGE;  Surgeon: [name], MD;  Location: [place];  Service: Ophthalmology;  Laterality: Right;   PHOTOCOAGULATION WITH LASER Left 03/13/2019   Procedure: INDIRECT PHOTOCOAGULATION WITH LASER LEFT EYE;  Surgeon: [name], MD;  Location: [place];  Service: Ophthalmology;  Laterality: Left;   PLACEMENT AND SUTURE OF SECONDARY INTRAOCULAR LENS Right 06/27/2019   Procedure: PLACEMENT AND SUTURE OF SECONDARY INTRAOCULAR LENS;  Surgeon: [name], MD;  Location: [place];  Service: Ophthalmology;  Laterality: Right;   RETINAL DETACHMENT SURGERY Right 03/13/2019   PPV for repair of rheg. RD - Dr. [name]    Current Medications: No outpatient medications have been marked as taking for the 10/04/21 encounter (Appointment) with Freada Bergeron, MD.     Allergies:   Patient has no known allergies.   Social History   Socioeconomic History   Marital status: Married    Spouse name: Not on file   Number of children: 7   Years of education:  Not on file   Highest education level: Not on file  Occupational History   Occupation: welder  Tobacco Use   Smoking status: Every Day    Packs/day: 0.50    Years: 43.00    Pack years: 21.50    Types: Cigarettes   Smokeless tobacco: Never   Tobacco comments:    pt is trying to aggressively cut back on # cigs smoked daily.   Vaping Use   Vaping Use: Never used  Substance and Sexual Activity   Alcohol use: No   Drug use: No   Sexual activity: Not on file  Other Topics Concern   Not on file  Social History Narrative   Unemployed. Currently in process of applying for disability.    Social Determinants of Health   Financial  Resource Strain: Not on file  Food Insecurity: Not on file  Transportation Needs: Not on file  Physical Activity: Not on file  Stress: Not on file  Social Connections: Not on file     Family History: The patient's ***family history includes Cancer in his mother; Cirrhosis in his father; Heart disease in his father.  ROS:   Please see the history of present illness.    *** All other systems reviewed and are negative.  EKGs/Labs/Other Studies Reviewed:    The following studies were reviewed today: ***  EKG:  EKG is *** ordered today.  The ekg ordered today demonstrates ***  Recent Labs: 10/03/2020: TSH 0.858 06/02/2021: ALT 28 08/17/2021: BUN 14; Creatinine, Ser 1.44; Hemoglobin 16.1; Platelets 247; Potassium 4.4; Sodium 139  Recent Lipid Panel    Component Value Date/Time   CHOL 197 06/11/2019 1043   TRIG 185 (H) 06/11/2019 1043   HDL 29 (L) 06/11/2019 1043   CHOLHDL 6.8 (H) 06/11/2019 1043   CHOLHDL 5 01/25/2018 1014   VLDL 46.6 (H) 01/25/2018 1014   LDLCALC 134 (H) 06/11/2019 1043   LDLDIRECT 90.0 10/03/2020 0630     Risk Assessment/Calculations:   {Does this patient have ATRIAL FIBRILLATION?:[phone]}       Physical Exam:    VS:  There were no vitals taken for this visit.    Wt Readings from Last 3 Encounters:  09/24/21 198 lb (89.8 kg)  08/17/21 196 lb 6.4 oz (89.1 kg)  07/13/21 193 lb 6.4 oz (87.7 kg)     GEN: *** Well nourished, well developed in no acute distress HEENT: Normal NECK: No JVD; No carotid bruits LYMPHATICS: No lymphadenopathy CARDIAC: ***RRR, no murmurs, rubs, gallops RESPIRATORY:  Clear to auscultation without rales, wheezing or rhonchi  ABDOMEN: Soft, non-tender, non-distended MUSCULOSKELETAL:  No edema; No deformity  SKIN: Warm and dry NEUROLOGIC:  Alert and oriented x 3 PSYCHIATRIC:  Normal affect   ASSESSMENT:    No diagnosis found. PLAN:    In order of problems listed above:  #Pre-operative Evaluation: #Abnormal  ECG: Patient without significant symptoms, however, agree he has several risk factors for significant CAD. Given abnormal ECG and risk factors, will check TTE and myoview prior to procedure.  #Prior CVA: -Continue plavix '75mg'$  daily -Continue lipitor '80mg'$  daily  #HTN: -Continue lisinopril '40mg'$  daily -Continue spironlactone '25mg'$  daily -Continue HCTZ 12.'5mg'$  daily -Continue amlodipine '10mg'$  daily  #HLD: -Continue lipitor '80mg'$  daily  #COPD: -Continue home inhalers -Recommended tobacco cessation  #DMII: -Management per PCP  #CKD IIIA: -Follow-up with PCP  #Tobacco Use: -Cessation encouraged  #OSA: -On CPAP      {Are you ordering a CV Procedure (e.g. stress test, cath, DCCV, TEE,  etc)?   Press F2        :[id number]    Medication Adjustments/Labs and Tests Ordered: Current medicines are reviewed at length with the patient today.  Concerns regarding medicines are outlined above.  No orders of the defined types were placed in this encounter.  No orders of the defined types were placed in this encounter.   There are no Patient Instructions on file for this visit.   Signed, Freada Bergeron, MD  09/29/2021 4:27 PM    [place]

## 2021-10-03 NOTE — Procedures (Signed)
    Patient Name: Jonathan [name], Am[place] Date: 09/24/2021 Gender: Male D.O.B: 12/02/1950 Age (years): 32 Referring Provider: Leeanne Rio Height (inches): 66 Interpreting Physician: Baird Lyons MD, ABSM Weight (lbs): 198 RPSGT: Cezar, Misiaszek BMI: 32 MRN: [id number] Neck Size: 19.00  CLINICAL INFORMATION Sleep Study Type: NPSG Indication for sleep study: Snoring, Witnesses Apnea / Gasping During Sleep Epworth Sleepiness Score: 3  SLEEP STUDY TECHNIQUE As per the AASM Manual for the Scoring of Sleep and Associated Events v2.3 (April 2016) with a hypopnea requiring 4% desaturations.  The channels recorded and monitored were frontal, central and occipital EEG, electrooculogram (EOG), submentalis EMG (chin), nasal and oral airflow, thoracic and abdominal wall motion, anterior tibialis EMG, snore microphone, electrocardiogram, and pulse oximetry.  MEDICATIONS Medications self-administered by patient taken the night of the study : none reported  SLEEP ARCHITECTURE The study was initiated at 10:18:52 PM and ended at 4:49:32 AM.  Sleep onset time was 13.8 minutes and the sleep efficiency was 62.4%%. The total sleep time was 243.8 minutes.  Stage REM latency was 98.5 minutes.  The patient spent 5.7%% of the night in stage N1 sleep, 65.6%% in stage N2 sleep, 0.0%% in stage N3 and 28.7% in REM.  Alpha intrusion was absent.  Supine sleep was 13.12%.  RESPIRATORY PARAMETERS The overall apnea/hypopnea index (AHI) was 16.5 per hour. There were 53 total apneas, including 40 obstructive, 9 central and 4 mixed apneas. There were 14 hypopneas and 0 RERAs.  The AHI during Stage REM sleep was 5.1 per hour.  AHI while supine was 99.4 per hour.  The mean oxygen saturation was 92.1%. The minimum SpO2 during sleep was 85.0%.  moderate snoring was noted during this study.  CARDIAC DATA The 2 lead EKG demonstrated sinus rhythm. The mean heart rate was 51.2 beats per minute. Other EKG  findings include: None.  LEG MOVEMENT DATA The total PLMS were 0 with a resulting PLMS index of 0.0. Associated arousal with leg movement index was 0.0 .  IMPRESSIONS - Moderate obstructive sleep apnea occurred during this study (AHI = 16.5/h). - No significant central sleep apnea occurred during this study (CAI = 2.2/h). - Mild oxygen desaturation was noted during this study (Min O2 = 85.0%). Mean 92.7% - The patient snored with moderate snoring volume. - No cardiac abnormalities were noted during this study. - Clinically significant periodic limb movements did not occur during sleep. No significant associated arousals.  DIAGNOSIS - Obstructive Sleep Apnea (G47.33)  RECOMMENDATIONS - Suggest CPAP titration sleep study or autopap. Other options would be based on clinical judgment. - Be careful with alcohol, sedatives and other CNS depressants that may worsen sleep apnea and disrupt normal sleep architecture. - Sleep hygiene should be reviewed to assess factors that may improve sleep quality. - Weight management and regular exercise should be initiated or continued if appropriate.  [Electronically signed] 10/03/2021 12:19 PM  Baird Lyons MD, [place], American Board of Sleep Medicine NPI: [id number]                         [place], [place] [facility]  ELECTRONICALLY SIGNED ON:  10/03/2021, 12:16 PM [place] PH: [phone]   FX: [phone] [place]

## 2021-10-04 ENCOUNTER — Ambulatory Visit (INDEPENDENT_AMBULATORY_CARE_PROVIDER_SITE_OTHER): Payer: Medicare Other | Admitting: Cardiology

## 2021-10-04 ENCOUNTER — Encounter: Payer: Self-pay | Admitting: *Deleted

## 2021-10-04 ENCOUNTER — Encounter: Payer: Self-pay | Admitting: Cardiology

## 2021-10-04 VITALS — BP 154/82 | HR 65 | Ht 66.0 in | Wt 201.4 lb

## 2021-10-04 DIAGNOSIS — E119 Type 2 diabetes mellitus without complications: Secondary | ICD-10-CM

## 2021-10-04 DIAGNOSIS — Z8673 Personal history of transient ischemic attack (TIA), and cerebral infarction without residual deficits: Secondary | ICD-10-CM

## 2021-10-04 DIAGNOSIS — I10 Essential (primary) hypertension: Secondary | ICD-10-CM | POA: Diagnosis not present

## 2021-10-04 DIAGNOSIS — Z720 Tobacco use: Secondary | ICD-10-CM

## 2021-10-04 DIAGNOSIS — R9431 Abnormal electrocardiogram [ECG] [EKG]: Secondary | ICD-10-CM

## 2021-10-04 DIAGNOSIS — Z01810 Encounter for preprocedural cardiovascular examination: Secondary | ICD-10-CM | POA: Diagnosis not present

## 2021-10-04 MED ORDER — HYDROCHLOROTHIAZIDE 25 MG PO TABS: 25.0000 mg | ORAL_TABLET | Freq: Every day | ORAL | 3 refills | Status: DC

## 2021-10-04 MED ORDER — SPIRONOLACTONE 50 MG PO TABS: 50.0000 mg | ORAL_TABLET | Freq: Every day | ORAL | 3 refills | Status: DC

## 2021-10-04 NOTE — Progress Notes (Signed)
Cardiology Office Note:    Date:  10/04/2021   ID:  Jonathan [name], DOB [birth year], MRN [id number]  PCP:  Jonathan Rio, MD   [facility] Providers Cardiologist:  None {  Referring MD: Jonathan Rio, MD    History of Present Illness:    Jonathan [name] is a 71 y.o. male with a hx of HTN, COPD, tobacco abuse DMII, anxiety, depression, HLD and prior CVA who was referred by Dr. Ardelia [name] for abnormal ECG and pre-op evaluation.  Patient seen by Dr. Ardelia [name] on 08/17/21. Note reviewed. He is planned to have nephrectomy done of a renal mass. He was not having any significant CV symptoms at that time, however, ECG showed biphasic T waves in precordial leads and TWI in lead I. Given his risk factors, he was recommended to follow-up with Cardiology for further evaluation.  Today, he is accompanied by his wife and daughter. The patient states that he is feeling fine. He denies any chest pains or shortness of breath.  In clinic today his blood pressure is 154/82. He confirms that this is similar to his readings at home.  He has not yet been scheduled for his nephrectomy pending today's cardiology evaluation. He states he is able to walk up a flight of stairs without significant issues. Also he stays active with spending time with his grandchildren. He typically walks to the bus stop with his grandchildren with no symptoms.   He is currently smoking, but working on cutting back. He is down from 1 ppd to less than 0.5 ppd.  He endorses borderline diabetes. His metformin was discontinued due to causing diarrhea.  He denies any palpitations, or peripheral edema. No lightheadedness, headaches, syncope, orthopnea, or PND. No known CVD.   Past Medical History:  Diagnosis Date   Anxiety    Arthritis    COPD (chronic obstructive pulmonary disease) ([place])    Depression    Diabetes mellitus without complication ([place])    Diarrhea    History of kidney stones    HTN (hypertension)     Hyperlipidemia    Hypertensive retinopathy    OU   Pneumonia    Retinal detachment    Rheg. RD OD   Sleep apnea    does not use cpap   Stroke [facility])    denies any deficits    Past Surgical History:  Procedure Laterality Date   CATARACT EXTRACTION Bilateral    Dr. Sherrilee Gilles     EYE SURGERY Bilateral    Cat Sx OU; RD repair OD   GAS INSERTION Right 03/13/2019   Procedure: INSERTION OF GAS (C3F8) RIGHT EYE;  Surgeon: [name], MD;  Location: [place];  Service: Ophthalmology;  Laterality: Right;   LAPAROSCOPIC APPENDECTOMY N/A 10/03/2020   Procedure: APPENDECTOMY LAPAROSCOPIC;  Surgeon: Ralene Ok, MD;  Location: [place];  Service: General;  Laterality: N/A;   LASER PHOTO ABLATION Right 03/13/2019   Procedure: LASER PHOTO  ABLATION RIGHT EYE;  Surgeon: [name], MD;  Location: [place];  Service: Ophthalmology;  Laterality: Right;   none     PARS PLANA VITRECTOMY Right 03/13/2019   Procedure: 25 GAUGE PARS PLANA VITRECTOMY WITH  INTRAOCULAR LENSE EXPLANTATION RIGHT EYE.;  Surgeon: [name], MD;  Location: [place];  Service: Ophthalmology;  Laterality: Right;   PARS PLANA VITRECTOMY Right 06/27/2019   Procedure: PARS PLANA VITRECTOMY WITH 25 GAUGE;  Surgeon: [name], MD;  Location: [place];  Service: Ophthalmology;  Laterality: Right;  PHOTOCOAGULATION WITH LASER Left 03/13/2019   Procedure: INDIRECT PHOTOCOAGULATION WITH LASER LEFT EYE;  Surgeon: [name], MD;  Location: [place];  Service: Ophthalmology;  Laterality: Left;   PLACEMENT AND SUTURE OF SECONDARY INTRAOCULAR LENS Right 06/27/2019   Procedure: PLACEMENT AND SUTURE OF SECONDARY INTRAOCULAR LENS;  Surgeon: [name], MD;  Location: [place];  Service: Ophthalmology;  Laterality: Right;   RETINAL DETACHMENT SURGERY Right 03/13/2019   PPV for repair of rheg. RD - Dr. [name]    Current Medications: Current Meds  Medication Sig   albuterol (VENTOLIN HFA) 108 (90 Base) MCG/ACT inhaler Inhale  2 puffs into the lungs every 6 (six) hours as needed.   amLODipine (NORVASC) 10 MG tablet Take 1 tablet (10 mg total) by mouth daily.   atorvastatin (LIPITOR) 80 MG tablet Take 1 tablet (80 mg total) by mouth daily.   Blood Glucose Monitoring Suppl (ONETOUCH VERIO) w/Device KIT Check blood sugar once per day   Blood Pressure Monitoring (3 SERIES BP MONITOR/UPPER ARM) DEVI Check blood pressure once a day   buPROPion (WELLBUTRIN XL) 300 MG 24 hr tablet Take 1 tablet (300 mg total) by mouth every morning.   clopidogrel (PLAVIX) 75 MG tablet Take 75 mg by mouth daily.   fluticasone (FLONASE) 50 MCG/ACT nasal spray Place 1 spray into both nostrils daily.   glucose blood (ONETOUCH VERIO) test strip Check sugar once per day   Lancet Devices (ONE TOUCH DELICA LANCING DEV) MISC Check sugar once per day   lisinopril (ZESTRIL) 40 MG tablet Take 1 tablet by mouth once daily   OneTouch Delica Lancets 03K MISC Check sugar once per day   tamsulosin (FLOMAX) 0.4 MG CAPS capsule Take 0.4 mg by mouth.   [DISCONTINUED] hydrochlorothiazide (HYDRODIURIL) 12.5 MG tablet Take 1 tablet (12.5 mg total) by mouth daily.   [DISCONTINUED] spironolactone (ALDACTONE) 25 MG tablet Take 1 tablet (25 mg total) by mouth daily.     Allergies:   Patient has no known allergies.   Social History   Socioeconomic History   Marital status: Married    Spouse name: Not on file   Number of children: 7   Years of education: Not on file   Highest education level: Not on file  Occupational History   Occupation: welder  Tobacco Use   Smoking status: Every Day    Packs/day: 0.50    Years: 43.00    Total pack years: 21.50    Types: Cigarettes   Smokeless tobacco: Never   Tobacco comments:    pt is trying to aggressively cut back on # cigs smoked daily.   Vaping Use   Vaping Use: Never used  Substance and Sexual Activity   Alcohol use: No   Drug use: No   Sexual activity: Not on file  Other Topics Concern   Not on file   Social History Narrative   Unemployed. Currently in process of applying for disability.    Social Determinants of Health   Financial Resource Strain: Not on file  Food Insecurity: Not on file  Transportation Needs: Not on file  Physical Activity: Not on file  Stress: Not on file  Social Connections: Not on file     Family History: The patient's family history includes Cancer in his mother; Cirrhosis in his father; Heart disease in his father.  ROS:   Review of Systems  Constitutional:  Negative for chills and fever.  HENT:  Negative for ear pain.   Eyes:  Negative for  discharge.  Respiratory:  Negative for cough, hemoptysis and stridor.   Cardiovascular:  Negative for chest pain, palpitations, orthopnea, claudication, leg swelling and PND.  Gastrointestinal:  Negative for blood in stool and heartburn.  Genitourinary:  Negative for hematuria.  Musculoskeletal:  Negative for falls.  Neurological:  Negative for sensory change and headaches.  Endo/Heme/Allergies:  Negative for polydipsia.  Psychiatric/Behavioral:  Negative for depression and hallucinations.      EKGs/Labs/Other Studies Reviewed:    The following studies were reviewed today:  CT Chest  04/07/2021: FINDINGS: Cardiovascular: Normal heart size. Aortic atherosclerosis. Coronary artery calcifications. No pericardial effusion.   Mediastinum/Nodes: No enlarged mediastinal, hilar, or axillary lymph nodes. Thyroid gland, trachea, and esophagus demonstrate no significant findings.   Lungs/Pleura: Very mild centrilobular and paraseptal emphysema. Diffuse bronchial wall thickening. No pleural effusion or airspace consolidation. Several small lung nodules are again noted. The largest is in the lingula with a mean derived diameter of 4.2 mm. These are unchanged in the interval. No new lung nodule.   Upper Abdomen: No acute abnormality.  Mild hepatic steatosis.   Musculoskeletal: No chest wall mass or suspicious  bone lesions identified.   IMPRESSION: 1. Lung-RADS 2, benign appearance or behavior. Continue annual screening with low-dose chest CT without contrast in 12 months. 2. Hepatic steatosis. 3. Aortic Atherosclerosis (ICD10-I70.0) and Emphysema (ICD10-J43.9).  Bilateral Carotid Doppler  10/04/2020: Summary:  Right Carotid: Velocities in the right ICA are consistent with a 40-59%                 stenosis.   Left Carotid: Velocities in the left ICA are consistent with a 1-39%  stenosis.   Vertebrals:  Bilateral vertebral arteries demonstrate antegrade flow.  Subclavians: Normal flow hemodynamics were seen in bilateral subclavian arteries.   Echo  10/03/2020: Sonographer Comments: Suboptimal subcostal window.  IMPRESSIONS    1. Left ventricular ejection fraction, by estimation, is 55 to 60%. The  left ventricle has normal function. The left ventricle has no regional  wall motion abnormalities. There is mild concentric left ventricular  hypertrophy. Left ventricular diastolic  parameters are consistent with Grade II diastolic dysfunction  (pseudonormalization). Elevated left atrial pressure.   2. Right ventricular systolic function is normal. The right ventricular  size is normal.   3. Left atrial size was mildly dilated.   4. The mitral valve is normal in structure. Trivial mitral valve  regurgitation. No evidence of mitral stenosis.   5. The aortic valve is tricuspid. There is mild thickening of the aortic  valve. Aortic valve regurgitation is not visualized. Mild aortic valve  sclerosis is present, with no evidence of aortic valve stenosis.   Comparison(s): No significant change from prior study.   Conclusion(s)/Recommendation(s): No intracardiac source of embolism  detected on this transthoracic study. A transesophageal echocardiogram is  recommended to exclude cardiac source of embolism if clinically indicated.    EKG:  EKG is personally reviewed. 10/04/2021: Sinus rhythm. T  wave inversions, anterolateral leads.   Recent Labs: 06/02/2021: ALT 28 08/17/2021: BUN 14; Creatinine, Ser 1.44; Hemoglobin 16.1; Platelets 247; Potassium 4.4; Sodium 139   Recent Lipid Panel    Component Value Date/Time   CHOL 197 06/11/2019 1043   TRIG 185 (H) 06/11/2019 1043   HDL 29 (L) 06/11/2019 1043   CHOLHDL 6.8 (H) 06/11/2019 1043   CHOLHDL 5 01/25/2018 1014   VLDL 46.6 (H) 01/25/2018 1014   LDLCALC 134 (H) 06/11/2019 1043   LDLDIRECT 90.0 10/03/2020 0630  Risk Assessment/Calculations:           Physical Exam:    VS:  BP (!) 154/82   Pulse 65   Ht _0  (1.676 m)   Wt 201 lb 6.4 oz (91.4 kg)   SpO2 96%   BMI 32.51 kg/m     Wt Readings from Last 3 Encounters:  10/04/21 201 lb 6.4 oz (91.4 kg)  09/24/21 198 lb (89.8 kg)  08/17/21 196 lb 6.4 oz (89.1 kg)     GEN: Well nourished, well developed in no acute distress HEENT: Normal NECK: No JVD; No carotid bruits CARDIAC: RRR, no murmurs, rubs, gallops RESPIRATORY:  Clear to auscultation without rales, wheezing or rhonchi  ABDOMEN: Soft, non-tender, non-distended MUSCULOSKELETAL:  No edema; No deformity  SKIN: Warm and dry NEUROLOGIC:  Alert and oriented x 3 PSYCHIATRIC:  Normal affect   ASSESSMENT:    1. Nonspecific abnormal electrocardiogram (ECG) (EKG)   2. Preoperative cardiovascular examination   3. History of stroke   4. Primary hypertension   5. Tobacco use   6. Diabetes mellitus with coincident hypertension (HCC)    PLAN:    In order of problems listed above:  #Pre-operative Evaluation: #Abnormal ECG: Patient without significant symptoms, however, agree he has several risk factors for significant CAD. Given abnormal ECG and risk factors, will check TTE and myoview for further work-up. If no evidence of significant ischemia, okay to proceed with surgery. -Check myoview and TTE for further work-up for abnormal ECG -If no high risk findings, okay to go for surgery from CV  perspective  #Prior CVA: -Continue plavix 61m daily -Continue lipitor 851mdaily  #HTN: Elevated to 150s.  -Continue lisinopril 4065maily -Increase spironlactone to 85m68mily -Increase HCTZ  to 25mg60mly -Continue amlodipine 10mg 69my  #HLD: -Continue lipitor 80mg d76m -Lipids per PCP  #COPD: -Continue home inhalers -Recommended tobacco cessation  #DMII: -Management per PCP  #CKD IIIA: -Follow-up with PCP  #Tobacco Use: -Cessation encouraged  #OSA: -On CPAP      Shared Decision Making/Informed Consent The risks [chest pain, shortness of breath, cardiac arrhythmias, dizziness, blood pressure fluctuations, myocardial infarction, stroke/transient ischemic attack, nausea, vomiting, allergic reaction, radiation exposure, metallic taste sensation and life-threatening complications (estimated to be 1 in 10,000)], benefits (risk stratification, diagnosing coronary artery disease, treatment guidance) and alternatives of a nuclear stress test were discussed in detail with Jonathan [name] agrees to proceed.  Follow-up:  6 months  Medication Adjustments/Labs and Tests Ordered: Current medicines are reviewed at length with the patient today.  Concerns regarding medicines are outlined above.   No orders of the defined types were placed in this encounter.  No orders of the defined types were placed in this encounter.  There are no Patient Instructions on file for this visit.   I,Mathew Stumpf,acting as a scribe Education administratoratherFreada Bergeronave documented all relevant documentation on the behalf of HeatherFreada Bergeron directed by  HeatherFreada Bergeronile in the presence of HeatherFreada BergeronI, HeatherFreada Bergeronave reviewed all documentation for this visit. The documentation on 10/04/21 for the exam, diagnosis, procedures, and orders are all accurate and complete.   Signed, HeatherFreada Bergeron/03/2022 4:33 PM    [facility]

## 2021-10-04 NOTE — Patient Instructions (Signed)
Medication Instructions:   INCREASE YOUR HYDROCHLOROTHIAZIDE TO 25 MG BY MOUTH DAILY  INCREASE YOUR SPIRONOLACTONE TO 50 MG BY MOUTH DAILY  *If you need a refill on your cardiac medications before your next appointment, please call your pharmacy*   Lab Work:  [facility] OFFICE--BMET  If you have labs (blood work) drawn today and your tests are completely normal, you will receive your results only by: [place] (if you have MyChart) OR A paper copy in the mail If you have any lab test that is abnormal or we need to change your treatment, we will call you to review the results.   Testing/Procedures:  Your physician has requested that you have an echocardiogram. Echocardiography is a painless test that uses sound waves to create images of your heart. It provides your doctor with information about the size and shape of your heart and how well your heart's chambers and valves are working. This procedure takes approximately one hour. There are no restrictions for this procedure.  Your physician has requested that you have a lexiscan myoview. For further information please visit [email or web address]. Please follow instruction sheet, as given.    Follow-Up: At [facility], you and your health needs are our priority.  As part of our continuing mission to provide you with exceptional heart care, we have created designated Provider Care Teams.  These Care Teams include your primary Cardiologist (physician) and Advanced Practice Providers (APPs -  Physician Assistants and Nurse Practitioners) who all work together to provide you with the care you need, when you need it.  We recommend signing up for the patient portal called "MyChart".  Sign up information is provided on this After Visit Summary.  MyChart is used to connect with patients for Virtual Visits (Telemedicine).  Patients are able to view lab/test results, encounter notes, upcoming appointments, etc.  Non-urgent messages  can be sent to your provider as well.   To learn more about what you can do with MyChart, go to [email or web address].    Your next appointment:   6 month(s)  The format for your next appointment:   In Person  Provider:   Robbie Lis, PA-C, Nicholes Rough, PA-C, Melina Copa, PA-C, Ambrose Pancoast, NP, Cecilie Kicks, NP, Ermalinda Barrios, PA-C, Christen Bame, NP, or Richardson Dopp, PA-C     {  Important Information About Sugar

## 2021-10-05 ENCOUNTER — Telehealth (HOSPITAL_COMMUNITY): Payer: Self-pay | Admitting: *Deleted

## 2021-10-05 ENCOUNTER — Encounter (HOSPITAL_COMMUNITY): Payer: Self-pay | Admitting: *Deleted

## 2021-10-05 NOTE — Telephone Encounter (Signed)
Patient given detailed instructions per Myocardial Perfusion Study Information Sheet for the test on 10/13/21 at 1030. Patient notified to arrive 15 minutes early and that it is imperative to arrive on time for appointment to keep from having the test rescheduled.  If you need to cancel or reschedule your appointment, please call the office within 24 hours of your appointment. . Patient verbalized understanding.[name], Ranae [place]

## 2021-10-13 ENCOUNTER — Ambulatory Visit (HOSPITAL_COMMUNITY): Payer: Medicare Other | Attending: Cardiology

## 2021-10-13 ENCOUNTER — Other Ambulatory Visit: Payer: Medicare Other

## 2021-10-13 DIAGNOSIS — Z8673 Personal history of transient ischemic attack (TIA), and cerebral infarction without residual deficits: Secondary | ICD-10-CM

## 2021-10-13 DIAGNOSIS — Z01810 Encounter for preprocedural cardiovascular examination: Secondary | ICD-10-CM

## 2021-10-13 DIAGNOSIS — Z720 Tobacco use: Secondary | ICD-10-CM | POA: Insufficient documentation

## 2021-10-13 DIAGNOSIS — I10 Essential (primary) hypertension: Secondary | ICD-10-CM | POA: Diagnosis not present

## 2021-10-13 DIAGNOSIS — E119 Type 2 diabetes mellitus without complications: Secondary | ICD-10-CM

## 2021-10-13 DIAGNOSIS — R9431 Abnormal electrocardiogram [ECG] [EKG]: Secondary | ICD-10-CM

## 2021-10-13 LAB — MYOCARDIAL PERFUSION IMAGING
LV dias vol: 79 mL (ref 62–150)
LV sys vol: 43 mL
Nuc Stress EF: 45 %
Peak HR: 88 {beats}/min
Rest HR: 57 {beats}/min
Rest Nuclear Isotope Dose: 10.4 mCi
SDS: 1
SRS: 0
SSS: 1
ST Depression (mm): 0 mm
Stress Nuclear Isotope Dose: 31.4 mCi
TID: 0.97

## 2021-10-13 LAB — BASIC METABOLIC PANEL
BUN/Creatinine Ratio: 19 (ref 10–24)
BUN: 31 mg/dL — ABNORMAL HIGH (ref 8–27)
CO2: 19 mmol/L — ABNORMAL LOW (ref 20–29)
Calcium: 9.5 mg/dL (ref 8.6–10.2)
Chloride: 101 mmol/L (ref 96–106)
Creatinine, Ser: 1.62 mg/dL — ABNORMAL HIGH (ref 0.76–1.27)
Glucose: 115 mg/dL — ABNORMAL HIGH (ref 70–99)
Potassium: 4.9 mmol/L (ref 3.5–5.2)
Sodium: 137 mmol/L (ref 134–144)
eGFR: 45 mL/min/{1.73_m2} — ABNORMAL LOW (ref >59)

## 2021-10-13 MED ORDER — TECHNETIUM TC 99M TETROFOSMIN IV KIT
31.4000 | PACK | Freq: Once | INTRAVENOUS | Status: AC | PRN
  Administered 2021-10-13: 31.4 via INTRAVENOUS

## 2021-10-13 MED ORDER — REGADENOSON 0.4 MG/5ML IV SOLN
0.4000 mg | Freq: Once | INTRAVENOUS | Status: AC
  Administered 2021-10-13: 0.4 mg via INTRAVENOUS

## 2021-10-13 MED ORDER — TECHNETIUM TC 99M TETROFOSMIN IV KIT
10.4000 | PACK | Freq: Once | INTRAVENOUS | Status: AC | PRN
  Administered 2021-10-13: 10.4 via INTRAVENOUS

## 2021-10-14 ENCOUNTER — Telehealth: Payer: Self-pay | Admitting: *Deleted

## 2021-10-14 DIAGNOSIS — I10 Essential (primary) hypertension: Secondary | ICD-10-CM

## 2021-10-14 DIAGNOSIS — R7989 Other specified abnormal findings of blood chemistry: Secondary | ICD-10-CM

## 2021-10-14 DIAGNOSIS — E785 Hyperlipidemia, unspecified: Secondary | ICD-10-CM

## 2021-10-14 DIAGNOSIS — Z79899 Other long term (current) drug therapy: Secondary | ICD-10-CM

## 2021-10-14 MED ORDER — HYDROCHLOROTHIAZIDE 12.5 MG PO CAPS: 12.5000 mg | ORAL_CAPSULE | Freq: Every day | ORAL | 1 refills | Status: DC

## 2021-10-14 MED ORDER — HYDRALAZINE HCL 25 MG PO TABS: 25.0000 mg | ORAL_TABLET | Freq: Three times a day (TID) | ORAL | 1 refills | Status: DC

## 2021-10-14 MED ORDER — ASPIRIN 81 MG PO TBEC: 81.0000 mg | DELAYED_RELEASE_TABLET | Freq: Every day | ORAL | 3 refills | Status: DC

## 2021-10-14 NOTE — Telephone Encounter (Signed)
Jonathan Bergeron, MD  Nuala Alpha, LPN Can we actually drop the dose of the HCTZ back to 12.'5mg'$  daily. I am afraid we are drying him out a bit. Repeat BMET in 10 days to ensure renal function back down.

## 2021-10-14 NOTE — Telephone Encounter (Signed)
Pt aware of both his stress test and lab results per Dr. Johney Frame.  Pt aware to decrease his HCTZ to 12.5 mg by mouth daily and come in for repeat bmet in 10 days.  Scheduled repeat BMET for 10/25/21.    Pt aware that he should start taking ASA 81 mg po daily after his procedure and continue his statin regimen.  Confirmed the pharmacy of choice with the pt.   Pt verbalized understanding and agrees with this plan.

## 2021-10-14 NOTE — Telephone Encounter (Signed)
-----   Message from Freada Bergeron, MD sent at 10/13/2021  8:44 PM EDT ----- His stress test shows evidence of a prior heart attack but no areas where there is diminished blood flow that requires fixing. Would recommend starting aspirin after his procedure and continuing the statin. There are no further interventions needed prior to his surgery.

## 2021-10-14 NOTE — Telephone Encounter (Signed)
Pt aware that per Dr. Johney Frame, his pressures are still to elevated so she recommends that we start him on hydralazine 25 mg po TID and refer him to our HTN Clinic to see the Pharmacist.   Confirmed the pharmacy of choice with the pt.   Pt aware I will place the referral to HTN Clinic in the system and send a message to our [facility] Scheduling team to call him back and arrange this appt. Pt verbalized understanding and agrees with this plan.

## 2021-10-14 NOTE — Telephone Encounter (Signed)
-----   Message from Freada Bergeron, MD sent at 10/14/2021 11:22 AM EDT ----- Can we get him on hydralazine '25mg'$  TID and get him set up with HTN clinic. Just want to make sure we get him better controlled.

## 2021-10-18 ENCOUNTER — Ambulatory Visit (HOSPITAL_COMMUNITY): Payer: Medicare Other

## 2021-10-25 ENCOUNTER — Other Ambulatory Visit: Payer: Medicare Other

## 2021-10-25 DIAGNOSIS — R7989 Other specified abnormal findings of blood chemistry: Secondary | ICD-10-CM | POA: Diagnosis not present

## 2021-10-25 DIAGNOSIS — E785 Hyperlipidemia, unspecified: Secondary | ICD-10-CM | POA: Diagnosis not present

## 2021-10-25 DIAGNOSIS — Z79899 Other long term (current) drug therapy: Secondary | ICD-10-CM | POA: Diagnosis not present

## 2021-10-25 DIAGNOSIS — I10 Essential (primary) hypertension: Secondary | ICD-10-CM | POA: Diagnosis not present

## 2021-10-25 LAB — BASIC METABOLIC PANEL
BUN/Creatinine Ratio: 11 (ref 10–24)
BUN: 17 mg/dL (ref 8–27)
CO2: 19 mmol/L — ABNORMAL LOW (ref 20–29)
Calcium: 9.1 mg/dL (ref 8.6–10.2)
Chloride: 105 mmol/L (ref 96–106)
Creatinine, Ser: 1.56 mg/dL — ABNORMAL HIGH (ref 0.76–1.27)
Glucose: 149 mg/dL — ABNORMAL HIGH (ref 70–99)
Potassium: 4 mmol/L (ref 3.5–5.2)
Sodium: 139 mmol/L (ref 134–144)
eGFR: 47 mL/min/{1.73_m2} — ABNORMAL LOW (ref >59)

## 2021-10-27 ENCOUNTER — Telehealth: Payer: Self-pay

## 2021-10-27 NOTE — Telephone Encounter (Signed)
Informed patient of lab resutls per Dr. Johney Frame..."Kidney function and electrolytes look good."  Blood pressure while on phone 188/88, P 78. Called patient back in five minutes after him resting. BP 156/79, P 73. Patient denies chest pain, edema, sob, dizziness/lightheadedness.

## 2021-10-27 NOTE — Telephone Encounter (Addendum)
LMTCB. Regarding lab results and BP

## 2021-10-29 ENCOUNTER — Ambulatory Visit (HOSPITAL_COMMUNITY): Payer: Medicare Other | Attending: Cardiology

## 2021-10-29 DIAGNOSIS — Z720 Tobacco use: Secondary | ICD-10-CM | POA: Diagnosis not present

## 2021-10-29 DIAGNOSIS — I10 Essential (primary) hypertension: Secondary | ICD-10-CM | POA: Diagnosis not present

## 2021-10-29 DIAGNOSIS — R9431 Abnormal electrocardiogram [ECG] [EKG]: Secondary | ICD-10-CM | POA: Diagnosis not present

## 2021-10-29 DIAGNOSIS — E119 Type 2 diabetes mellitus without complications: Secondary | ICD-10-CM

## 2021-10-29 DIAGNOSIS — Z01810 Encounter for preprocedural cardiovascular examination: Secondary | ICD-10-CM | POA: Diagnosis not present

## 2021-10-29 DIAGNOSIS — Z8673 Personal history of transient ischemic attack (TIA), and cerebral infarction without residual deficits: Secondary | ICD-10-CM | POA: Insufficient documentation

## 2021-10-29 LAB — ECHOCARDIOGRAM COMPLETE
Area-P 1/2: 2.66 cm2
S' Lateral: 2.7 cm

## 2021-11-01 ENCOUNTER — Telehealth: Payer: Self-pay

## 2021-11-01 NOTE — Telephone Encounter (Signed)
-----   Message from Freada Bergeron, MD sent at 11/01/2021  7:02 AM EDT ----- His echo shows normal pumping function and no significant valve disease. Is his blood pressure still high?

## 2021-11-01 NOTE — Telephone Encounter (Signed)
Left message to call me back.

## 2021-11-01 NOTE — Telephone Encounter (Signed)
The patient has been notified of the result and verbalized understanding.  All questions (if any) were answered. Antonieta Iba, RN 11/01/2021 3:55 PM    Patient's wife is not sure about his blood pressure. She will have the patient start checking it regularly and let us know if it remains elevated.

## 2021-11-02 ENCOUNTER — Other Ambulatory Visit: Payer: Self-pay | Admitting: Family Medicine

## 2021-11-02 DIAGNOSIS — I10 Essential (primary) hypertension: Secondary | ICD-10-CM

## 2021-11-09 ENCOUNTER — Ambulatory Visit: Payer: Medicare Other | Admitting: Pharmacist

## 2021-11-09 NOTE — Progress Notes (Deleted)
Patient ID: Jonathan [name]                 DOB: Oct 25, 1950                      MRN: [id number]     HPI: Jonathan [name] is a 71 y.o. male referred by Dr. Johney [name] to HTN clinic. PMH is significant for HTN, DM2, HLD, CVA, 40-59% right and 1-39% left ICA stenosis, CKD, OSA, COPD, tobacco abuse, anxiety, and depression. Seen by Dr Jonathan [name] on 10/04/21 where BP was elevated at 154/82. His spironolactone was increased to 50mg  daily. HCTZ initially increased to 25mg  daily but then decreased back to 12.5mg  daily (BUN increased to 31). Stress test 10/13/21 showed prior MI with EF 45%, pt advised to start low dose aspirin after his nephrectomy. Echo 10/29/21 showed normal EF 60-65%. Pt reported BP remained elevated at home and he was started on hydralazine 25mg  TID and referred to PharmD.  Smoking, down from 1 PPD to < 0.5 PPD, needs to quit  SCr 1.4-1.6, K 4, Na 139  Tolerating hydral well? Inc dose if needed  Current HTN meds: amlodipine 10mg  daily, hydralazine 25mg  TID, HCTZ 12.5mg  daily, lisinopril 40mg  daily, spironolactone 50mg  daily  BP goal: <130/28mmHg  Family History: Cancer in his mother; Cirrhosis in his father; Heart disease in his father.  Social History: Smoking 1/2 PPD, down from 1 PPD, has smoked for 43 years.  Diet:   Exercise:   Home BP readings:   Wt Readings from Last 3 Encounters:  10/13/21 201 lb (91.2 kg)  10/04/21 201 lb 6.4 oz (91.4 kg)  09/24/21 198 lb (89.8 kg)   BP Readings from Last 3 Encounters:  10/04/21 (!) 154/82  08/17/21 (!) 129/56  07/13/21 119/65   Pulse Readings from Last 3 Encounters:  10/04/21 65  08/17/21 63  07/13/21 84    Renal function: CrCl cannot be calculated (Unknown ideal weight.).  Past Medical History:  Diagnosis Date   Anxiety    Arthritis    COPD (chronic obstructive pulmonary disease) ([place])    Depression    Diabetes mellitus without complication ([place])    Diarrhea    History of kidney stones    HTN (hypertension)     Hyperlipidemia    Hypertensive retinopathy    OU   Pneumonia    Retinal detachment    Rheg. RD OD   Sleep apnea    does not use cpap   Stroke [facility])    denies any deficits    Current Outpatient Medications on File Prior to Visit  Medication Sig Dispense Refill   albuterol (VENTOLIN HFA) 108 (90 Base) MCG/ACT inhaler Inhale 2 puffs into the lungs every 6 (six) hours as needed.     amLODipine (NORVASC) 10 MG tablet Take 1 tablet (10 mg total) by mouth daily. 90 tablet 3   aspirin EC 81 MG tablet Take 1 tablet (81 mg total) by mouth daily. Swallow whole. 90 tablet 3   atorvastatin (LIPITOR) 80 MG tablet Take 1 tablet (80 mg total) by mouth daily. 90 tablet 3   Blood Glucose Monitoring Suppl (ONETOUCH VERIO) w/Device KIT Check blood sugar once per day 1 kit 0   Blood Pressure Monitoring (3 SERIES BP MONITOR/UPPER ARM) DEVI Check blood pressure once a day 1 each 0   buPROPion (WELLBUTRIN XL) 300 MG 24 hr tablet Take 1 tablet (300 mg total) by mouth every morning. 30 tablet 6  clopidogrel (PLAVIX) 75 MG tablet Take 75 mg by mouth daily.     fluticasone (FLONASE) 50 MCG/ACT nasal spray Place 1 spray into both nostrils daily. 16 g 2   glucose blood (ONETOUCH VERIO) test strip Check sugar once per day 100 each 3   hydrALAZINE (APRESOLINE) 25 MG tablet Take 1 tablet (25 mg total) by mouth 3 (three) times daily. 270 tablet 1   hydrochlorothiazide (MICROZIDE) 12.5 MG capsule Take 1 capsule (12.5 mg total) by mouth daily. 90 capsule 1   Lancet Devices (ONE TOUCH DELICA LANCING DEV) MISC Check sugar once per day 1 each 0   lisinopril (ZESTRIL) 40 MG tablet Take 1 tablet by mouth once daily 90 tablet 3   OneTouch Delica Lancets 89B MISC Check sugar once per day 100 each 3   spironolactone (ALDACTONE) 50 MG tablet Take 1 tablet (50 mg total) by mouth daily. 90 tablet 3   tamsulosin (FLOMAX) 0.4 MG CAPS capsule Take 0.4 mg by mouth.     No current facility-administered medications on file prior to  visit.    No Known Allergies   Assessment/Plan:  1. Hypertension -

## 2021-11-25 NOTE — Progress Notes (Unsigned)
Patient ID: Jonathan [name]                 DOB: 07-19-1950                      MRN: [id number]     HPI: Jonathan [name] is a 71 y.o. male referred by Dr. Johney Frame to HTN clinic. PMH is significant for HTN, HLD, COPD, tobacco use, T2DM, CVA, anxiety and depression. Patient recently saw Dr. Johney Frame in clinic and blood pressure was elevated at 154/82 mmHg. At this visit, HCTZ was decreased from 25 mg to 12.5 mg and spironolactone was increased from 25 mg to 50 mg daily.   Most recent SCr 1.56 (10/25/21), K 4   Ask about decreasing number of cigarettes per day, if they have used 1800QUIT NOW support program  Ask about diet and exercise Ask if measures bp at home or has gotten a home BP monitor  Note says will refill Cozaar on 09/06/21 but lisinopril on med list?  Incr hydralazine?   Current HTN meds: amlodipine 10 mg daily, hydralazine 25 mg three time daily, hydrochlorothiazide 12.5 mg daily, spironolactone 50 mg daily, lisinopril 40 mg daily  Previously tried: doxazosin 2 mg daily, losartan 50 mg daily, triamterene-hydrochlorothiazide 37.5-25 mg daily, lisinopril 40 mg daily (cough) BP goal: <130/80  Family History: mother- cancer; father- cirrhosis, heart disease   Social History: smokes every day, 1/2 pack of day back in March 2023, trying to cut back on # of cigarettes on bupropion, no alcohol use   Diet:   Exercise:   Home BP readings:   Wt Readings from Last 3 Encounters:  10/13/21 201 lb (91.2 kg)  10/04/21 201 lb 6.4 oz (91.4 kg)  09/24/21 198 lb (89.8 kg)   BP Readings from Last 3 Encounters:  10/04/21 (!) 154/82  08/17/21 (!) 129/56  07/13/21 119/65   Pulse Readings from Last 3 Encounters:  10/04/21 65  08/17/21 63  07/13/21 84    Renal function: CrCl cannot be calculated (Patient's most recent lab result is older than the maximum 21 days allowed.).  Past Medical History:  Diagnosis Date   Anxiety    Arthritis    COPD (chronic obstructive pulmonary disease)  ([place])    Depression    Diabetes mellitus without complication ([place])    Diarrhea    History of kidney stones    HTN (hypertension)    Hyperlipidemia    Hypertensive retinopathy    OU   Pneumonia    Retinal detachment    Rheg. RD OD   Sleep apnea    does not use cpap   Stroke [facility])    denies any deficits    Current Outpatient Medications on File Prior to Visit  Medication Sig Dispense Refill   albuterol (VENTOLIN HFA) 108 (90 Base) MCG/ACT inhaler Inhale 2 puffs into the lungs every 6 (six) hours as needed.     amLODipine (NORVASC) 10 MG tablet Take 1 tablet (10 mg total) by mouth daily. 90 tablet 3   aspirin EC 81 MG tablet Take 1 tablet (81 mg total) by mouth daily. Swallow whole. 90 tablet 3   atorvastatin (LIPITOR) 80 MG tablet Take 1 tablet (80 mg total) by mouth daily. 90 tablet 3   Blood Glucose Monitoring Suppl (ONETOUCH VERIO) w/Device KIT Check blood sugar once per day 1 kit 0   Blood Pressure Monitoring (3 SERIES BP MONITOR/UPPER ARM) DEVI Check blood pressure once a day 1  each 0   buPROPion (WELLBUTRIN XL) 300 MG 24 hr tablet Take 1 tablet (300 mg total) by mouth every morning. 30 tablet 6   clopidogrel (PLAVIX) 75 MG tablet Take 75 mg by mouth daily.     fluticasone (FLONASE) 50 MCG/ACT nasal spray Place 1 spray into both nostrils daily. 16 g 2   glucose blood (ONETOUCH VERIO) test strip Check sugar once per day 100 each 3   hydrALAZINE (APRESOLINE) 25 MG tablet Take 1 tablet (25 mg total) by mouth 3 (three) times daily. 270 tablet 1   hydrochlorothiazide (MICROZIDE) 12.5 MG capsule Take 1 capsule (12.5 mg total) by mouth daily. 90 capsule 1   Lancet Devices (ONE TOUCH DELICA LANCING DEV) MISC Check sugar once per day 1 each 0   lisinopril (ZESTRIL) 40 MG tablet Take 1 tablet by mouth once daily 90 tablet 3   OneTouch Delica Lancets 26S MISC Check sugar once per day 100 each 3   spironolactone (ALDACTONE) 50 MG tablet Take 1 tablet (50 mg total) by mouth daily. 90 tablet  3   tamsulosin (FLOMAX) 0.4 MG CAPS capsule Take 0.4 mg by mouth.     No current facility-administered medications on file prior to visit.    No Known Allergies  There were no vitals taken for this visit.   Assessment/Plan:  1. Hypertension -    Thank you  Ramond Dial, Pharm.D, BCPS, CPP [place]  [address], [place], [place] [address]  Phone: [phone]; Fax: [phone]

## 2021-11-26 ENCOUNTER — Ambulatory Visit: Payer: Medicare Other | Admitting: Pharmacist

## 2021-12-10 LAB — HM DIABETES EYE EXAM

## 2021-12-20 NOTE — Progress Notes (Unsigned)
Patient ID: Jonathan [name]                 DOB: Feb 16, 1951                      MRN: [id number]     HPI: Jonathan [name] is a 71 y.o. male referred by Dr. Johney Frame to HTN clinic. PMH is significant for HTN, HLD, COPD, tobacco use, T2DM, CVA, anxiety and depression. Patient saw Dr. Johney Frame 10/04/21 in clinic and blood pressure was elevated at 154/82 mmHg. At this visit, HCTZ was decreased from 25 mg to 12.5 mg and spironolactone was increased from 25 mg to 50 mg daily.   Most recent SCr 1.56 (10/25/21), K 4   Ask about decreasing number of cigarettes per day, if they have used 1800QUIT NOW support program  Ask about diet and exercise Ask if measures bp at home or has gotten a home BP monitor  Note says will refill Cozaar on 09/06/21 but lisinopril on med list?  Incr hydralazine?   Current HTN meds: amlodipine 10 mg daily, hydralazine 25 mg three time daily, hydrochlorothiazide 12.5 mg daily, spironolactone 50 mg daily, lisinopril 40 mg daily  Previously tried: doxazosin 2 mg daily, losartan 50 mg daily, triamterene-hydrochlorothiazide 37.5-25 mg daily, lisinopril 40 mg daily (cough) BP goal: <130/80  Family History: mother- cancer; father- cirrhosis, heart disease   Social History: smokes every day, 1/2 pack of day back in March 2023, trying to cut back on # of cigarettes on bupropion, no alcohol use   Diet:   Exercise:   Home BP readings:   Wt Readings from Last 3 Encounters:  10/13/21 201 lb (91.2 kg)  10/04/21 201 lb 6.4 oz (91.4 kg)  09/24/21 198 lb (89.8 kg)   BP Readings from Last 3 Encounters:  10/04/21 (!) 154/82  08/17/21 (!) 129/56  07/13/21 119/65   Pulse Readings from Last 3 Encounters:  10/04/21 65  08/17/21 63  07/13/21 84    Renal function: CrCl cannot be calculated (Patient's most recent lab result is older than the maximum 21 days allowed.).  Past Medical History:  Diagnosis Date   Anxiety    Arthritis    COPD (chronic obstructive pulmonary disease)  ([place])    Depression    Diabetes mellitus without complication ([place])    Diarrhea    History of kidney stones    HTN (hypertension)    Hyperlipidemia    Hypertensive retinopathy    OU   Pneumonia    Retinal detachment    Rheg. RD OD   Sleep apnea    does not use cpap   Stroke [facility])    denies any deficits    Current Outpatient Medications on File Prior to Visit  Medication Sig Dispense Refill   albuterol (VENTOLIN HFA) 108 (90 Base) MCG/ACT inhaler Inhale 2 puffs into the lungs every 6 (six) hours as needed.     amLODipine (NORVASC) 10 MG tablet Take 1 tablet (10 mg total) by mouth daily. 90 tablet 3   aspirin EC 81 MG tablet Take 1 tablet (81 mg total) by mouth daily. Swallow whole. 90 tablet 3   atorvastatin (LIPITOR) 80 MG tablet Take 1 tablet (80 mg total) by mouth daily. 90 tablet 3   Blood Glucose Monitoring Suppl (ONETOUCH VERIO) w/Device KIT Check blood sugar once per day 1 kit 0   Blood Pressure Monitoring (3 SERIES BP MONITOR/UPPER ARM) DEVI Check blood pressure once a day 1  each 0   buPROPion (WELLBUTRIN XL) 300 MG 24 hr tablet Take 1 tablet (300 mg total) by mouth every morning. 30 tablet 6   clopidogrel (PLAVIX) 75 MG tablet Take 75 mg by mouth daily.     fluticasone (FLONASE) 50 MCG/ACT nasal spray Place 1 spray into both nostrils daily. 16 g 2   glucose blood (ONETOUCH VERIO) test strip Check sugar once per day 100 each 3   hydrALAZINE (APRESOLINE) 25 MG tablet Take 1 tablet (25 mg total) by mouth 3 (three) times daily. 270 tablet 1   hydrochlorothiazide (MICROZIDE) 12.5 MG capsule Take 1 capsule (12.5 mg total) by mouth daily. 90 capsule 1   Lancet Devices (ONE TOUCH DELICA LANCING DEV) MISC Check sugar once per day 1 each 0   lisinopril (ZESTRIL) 40 MG tablet Take 1 tablet by mouth once daily 90 tablet 3   OneTouch Delica Lancets 08H MISC Check sugar once per day 100 each 3   spironolactone (ALDACTONE) 50 MG tablet Take 1 tablet (50 mg total) by mouth daily. 90 tablet  3   tamsulosin (FLOMAX) 0.4 MG CAPS capsule Take 0.4 mg by mouth.     No current facility-administered medications on file prior to visit.    No Known Allergies  There were no vitals taken for this visit.   Assessment/Plan:  1. Hypertension -    Thank you  Eliseo Gum, PharmD PGY1 Pharmacy Resident   12/21/2021  8:12 AM   Ramond Dial, Pharm.D, BCPS, CPP [place]  [address] [place], [place] [address]  Phone: [phone]; Fax: [phone]

## 2021-12-21 ENCOUNTER — Ambulatory Visit: Payer: Medicare Other

## 2021-12-27 NOTE — Patient Instructions (Addendum)
It was wonderful to see you today.  Please bring ALL of your medications with you to every visit.   Today we talked about:  -I am prescribing a steroid ointment for your arms.  Use it twice a day for 2 weeks, then he can space it out to once a day, and then as needed.  Return if worsening or no improvement. -Your blood pressure was elevated today. Please take your medications and call the cardiologist office to reschedule your appointment for your high blood pressure. -I have refilled your Plavix and your bupropion.   Thank you for choosing [facility].   Please call [phone] with any questions about today's appointment.  Please be sure to schedule follow up at the front  desk before you leave today.   Sharion Settler, DO PGY-3 Family Medicine

## 2021-12-27 NOTE — Progress Notes (Signed)
    SUBJECTIVE:   CHIEF COMPLAINT / HPI:   Jonathan [name] is a 71 y.o. male who presents to the [facility] clinic today to discuss the following concerns:   "Skin Problem" States that he has "small bumps going up and down his arm". For the last several months. Has been very itchy. He hasn't used anything for it.   HTN Did not take his medications today. Evaluated by Cardiology on 6/12 for abnormal EKG and pre-operative assessment. BP elevated in office as well as during telephone follow ups and adjustments were made to his HTN regimen. He now takings lisinopril '40mg'$  daily, spironlactone to '50mg'$  daily, HCTZ  to 12.5 mg daily, amlodipine '10mg'$  daily, hydralazine 25 mg TID. He was referred to the HTN clinic. Reports good adherence to medications though admits to forgetting to take medications on occasion. Checks BP at home, range 924-268 systolic, 34'H diastolic.   Quit smoking 98 days. Congratulated patient on this! They are excited to share the news with Dr. Valentina Lucks.    Also of note, he did not bring his medications today- was unsure of what he takes so unable to do med rec. He states he will bring them in next visit.   PERTINENT  PMH / PSH: HTN, HLD, T2DM, OSA, Bradycardia  OBJECTIVE:   BP (!) 180/89   Pulse (!) 58   Ht '5\' 6"'$  (1.676 m)   Wt 207 lb 6.4 oz (94.1 kg)   SpO2 98%   BMI 33.48 kg/m    Vitals:   12/28/21 1138 12/28/21 1209  BP: (!) 189/85 (!) 180/89  Pulse: (!) 58   SpO2: 98%    General: NAD, pleasant, able to participate in exam Cardio: Trace edema to b/l lower legs  Respiratory: Normal respiratory effort on room air  Skin: skin to posterior aspect of b/l forearm to mid arm is thickened with raised bumps throughout. Excoriations are noted to left upper arm  Psych: Normal affect and mood         ASSESSMENT/PLAN:   Essential hypertension BP today elevated on two checks, asymptomatic. He did not take his medications this morning. He is on multiple medications and  recently referred to HTN clinic but had to cancel appointment in August as he had to drive his daughter to jury duty. -Encouraged him to call and reschedule for HTN clinic -Encouraged daily adherence to medications; would likely benefit from single-pill combinations to decrease pill burden  Rash and nonspecific skin eruption On b/l forearms with thickened skin and nodules. Differential includes lichen vs prurigo nodularis vs neurodermatitis. Will trial high potency steroids. -Rx clobetasol -Return if no improvement or worsening     Sharion Settler, DO [place]

## 2021-12-28 ENCOUNTER — Ambulatory Visit (INDEPENDENT_AMBULATORY_CARE_PROVIDER_SITE_OTHER): Payer: Medicare Other | Admitting: Family Medicine

## 2021-12-28 ENCOUNTER — Encounter: Payer: Self-pay | Admitting: Family Medicine

## 2021-12-28 DIAGNOSIS — R21 Rash and other nonspecific skin eruption: Secondary | ICD-10-CM

## 2021-12-28 DIAGNOSIS — I10 Essential (primary) hypertension: Secondary | ICD-10-CM | POA: Diagnosis not present

## 2021-12-28 DIAGNOSIS — F17200 Nicotine dependence, unspecified, uncomplicated: Secondary | ICD-10-CM | POA: Diagnosis not present

## 2021-12-28 MED ORDER — CLOPIDOGREL BISULFATE 75 MG PO TABS: 75.0000 mg | ORAL_TABLET | Freq: Every day | ORAL | 1 refills | Status: DC

## 2021-12-28 MED ORDER — BUPROPION HCL ER (XL) 300 MG PO TB24: 300.0000 mg | ORAL_TABLET | Freq: Every morning | ORAL | 6 refills | Status: DC

## 2021-12-28 MED ORDER — CLOBETASOL PROPIONATE 0.05 % EX OINT: TOPICAL_OINTMENT | CUTANEOUS | 0 refills | Status: DC

## 2021-12-28 NOTE — Assessment & Plan Note (Signed)
BP today elevated on two checks, asymptomatic. He did not take his medications this morning. He is on multiple medications and recently referred to HTN clinic but had to cancel appointment in August as he had to drive his daughter to jury duty. -Encouraged him to call and reschedule for HTN clinic -Encouraged daily adherence to medications; would likely benefit from single-pill combinations to decrease pill burden

## 2021-12-28 NOTE — Assessment & Plan Note (Signed)
On b/l forearms with thickened skin and nodules. Differential includes lichen vs prurigo nodularis vs neurodermatitis. Will trial high potency steroids. -Rx clobetasol -Return if no improvement or worsening

## 2022-01-12 ENCOUNTER — Telehealth: Payer: Self-pay

## 2022-01-12 NOTE — Patient Outreach (Signed)
  Care Coordination   01/12/2022 Name: Jonathan [name] MRN: [id number] DOB: [birth year]   Care Coordination Outreach Attempts:  An unsuccessful telephone outreach was attempted today to offer the patient information about available care coordination services as a benefit of their health plan.   Follow Up Plan:  Additional outreach attempts will be made to offer the patient care coordination information and services.   Encounter Outcome:  No Answer  Care Coordination Interventions Activated:  No   Care Coordination Interventions:  No, not indicated    Jone Baseman, RN, MSN [facility] Care Management Care Management Coordinator Direct Line [phone]

## 2022-01-20 ENCOUNTER — Telehealth: Payer: Self-pay

## 2022-01-20 NOTE — Patient Outreach (Signed)
  Care Coordination   01/20/2022 Name: [name] MRN: [id number] DOB: [birth year]   Care Coordination Outreach Attempts:  A second unsuccessful outreach was attempted today to offer the patient with information about available care coordination services as a benefit of their health plan.     Follow Up Plan:  Additional outreach attempts will be made to offer the patient care coordination information and services.   Encounter Outcome:  No Answer  Care Coordination Interventions Activated:  No   Care Coordination Interventions:  No, not indicated     Jone Baseman, RN, MSN [facility] Care Management Care Management Coordinator Direct Line [phone]

## 2022-01-31 ENCOUNTER — Telehealth: Payer: Self-pay

## 2022-01-31 NOTE — Patient Outreach (Signed)
  Care Coordination   01/31/2022 Name: Jonathan [name] MRN: [id number] DOB: 20-Feb-1951   Care Coordination Outreach Attempts:  A third unsuccessful outreach was attempted today to offer the patient with information about available care coordination services as a benefit of their health plan.   Follow Up Plan:  No further outreach attempts will be made at this time. We have been unable to contact the patient to offer or enroll patient in care coordination services  Encounter Outcome:  No Answer  Care Coordination Interventions Activated:  No   Care Coordination Interventions:  No, not indicated    Jone Baseman, RN, MSN [place] [facility] Care Management Coordinator Direct Line [phone]

## 2022-02-21 ENCOUNTER — Ambulatory Visit: Payer: Medicare Other | Admitting: Family Medicine

## 2022-09-01 IMAGING — MR MR MRA HEAD W/O CM
1 series · 20 of 48 positions shown · non-contrast
Comparison: No pertinent prior exam.

CLINICAL DATA: Neuro deficit.

EXAM:
MRA HEAD WITHOUT CONTRAST
TECHNIQUE: Angiographic images of the Circle of Willis were acquired using MRA
technique without intravenous contrast.

[Series 5: 3d cow · axial · 0.5mm · 0.41mm/px · z∈[-117,-36]mm · 20 of 172 slices shown]
[im 1/172]
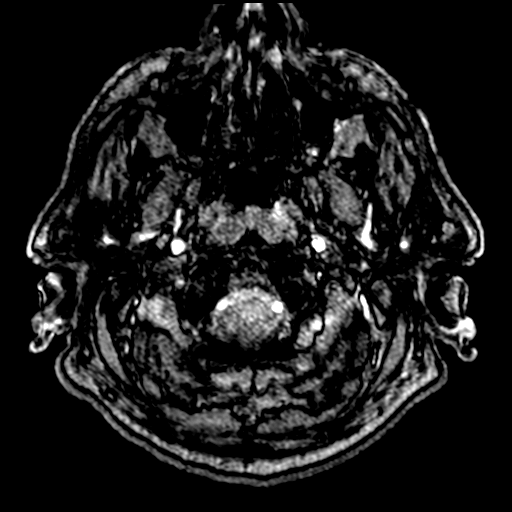
[im 4/172]
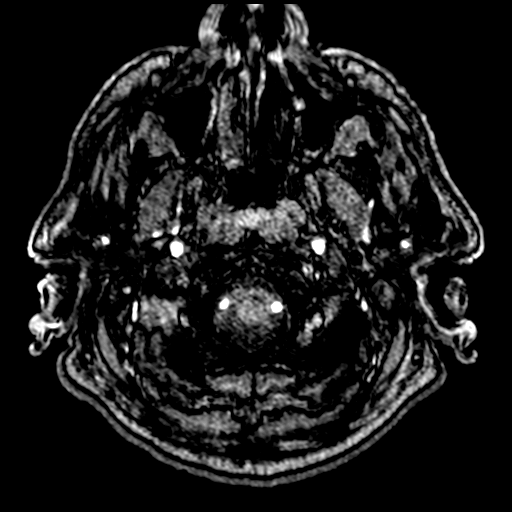
[im 8/172]
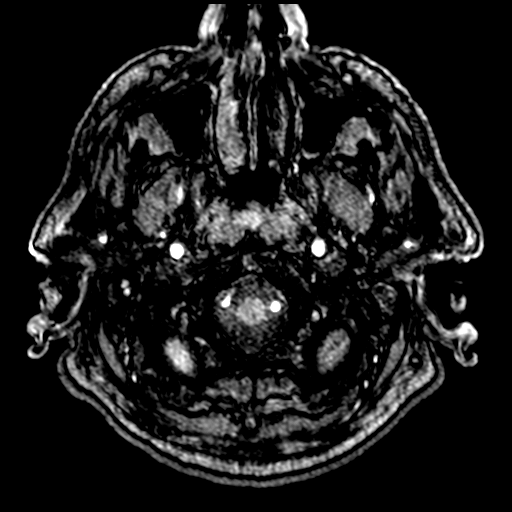
[im 11/172]
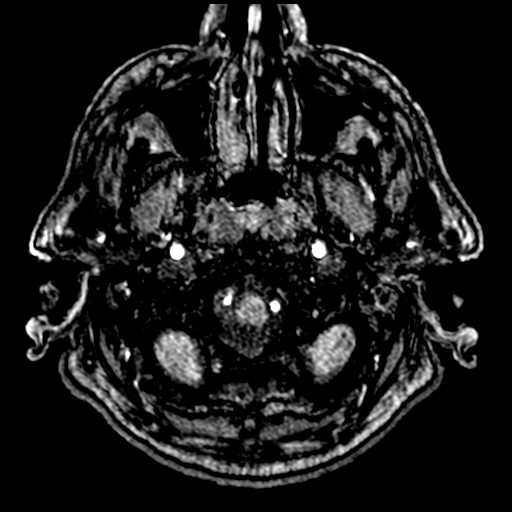
[im 15/172]
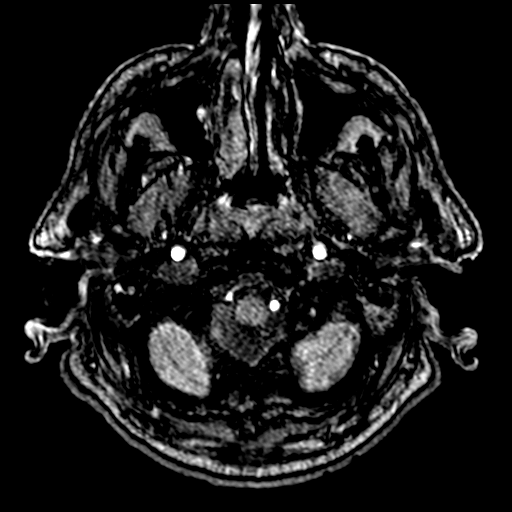
[im 19/172]
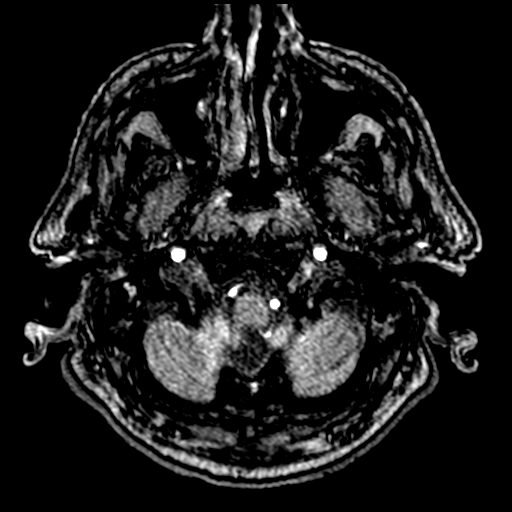
[im 22/172]
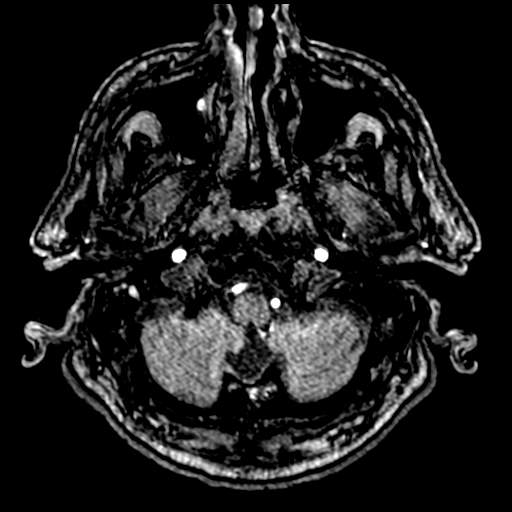
[im 26/172]
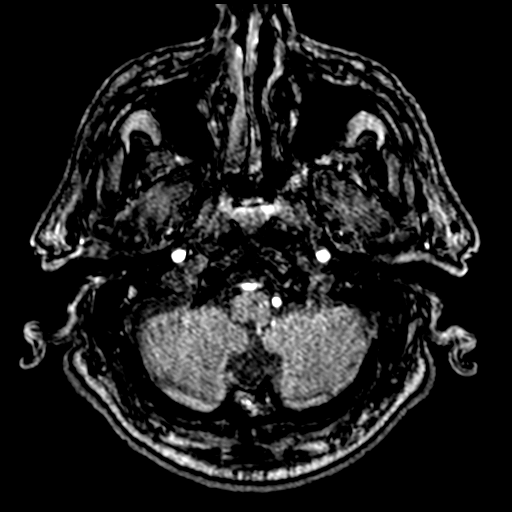
[im 30/172]
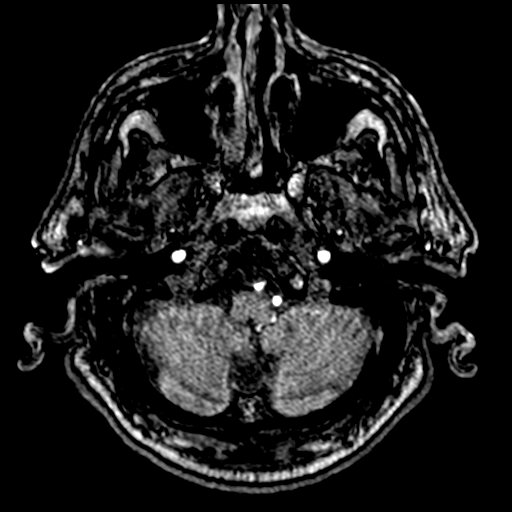
[im 33/172]
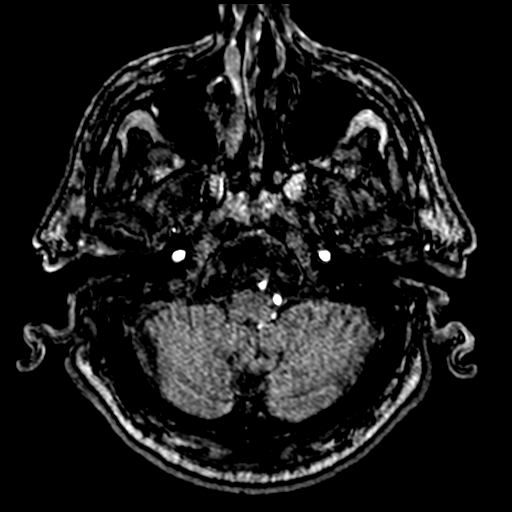
[im 37/172]
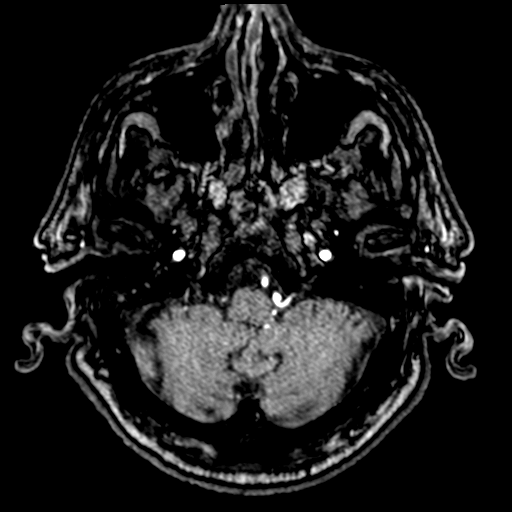
[im 41/172]
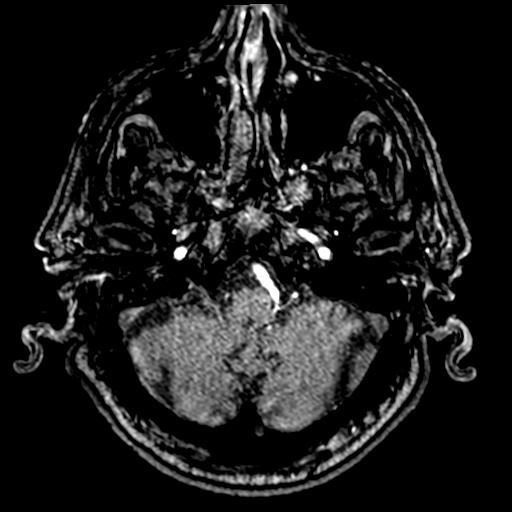
[im 55/172]
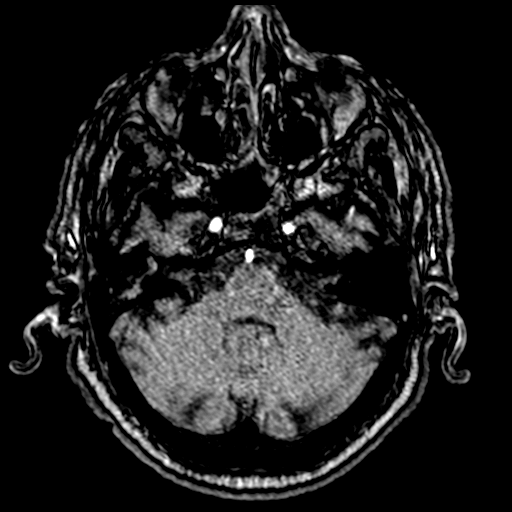
[im 77/172]
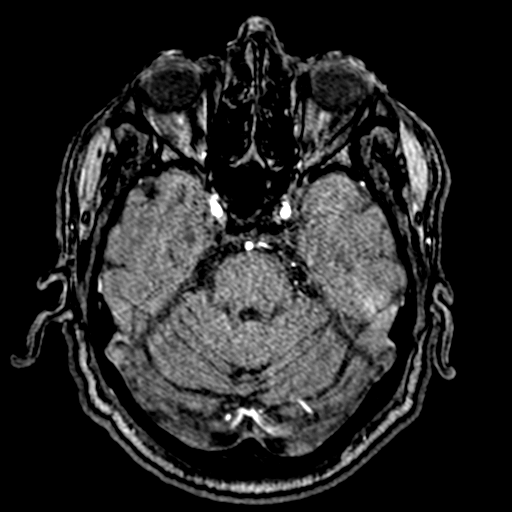
[im 88/172]
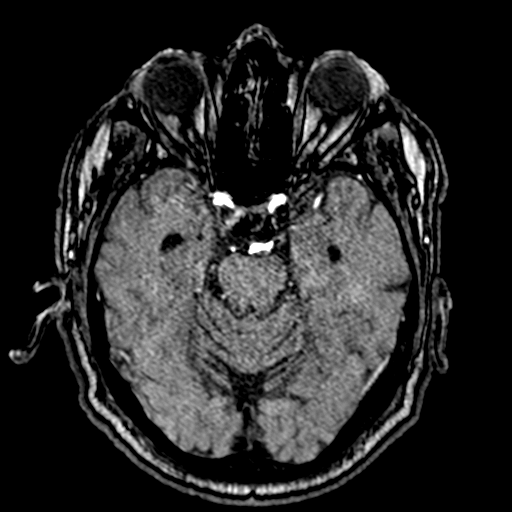
[im 99/172]
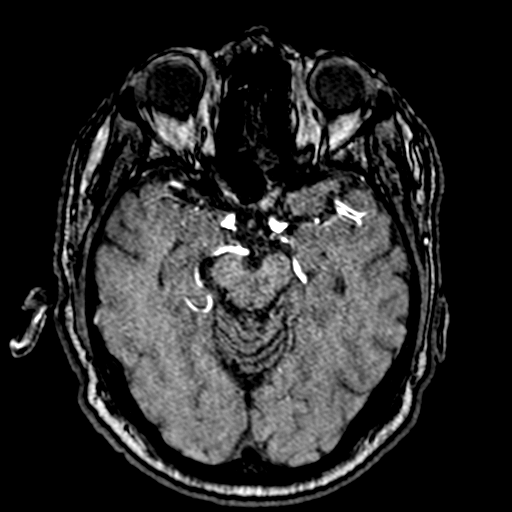
[im 121/172]
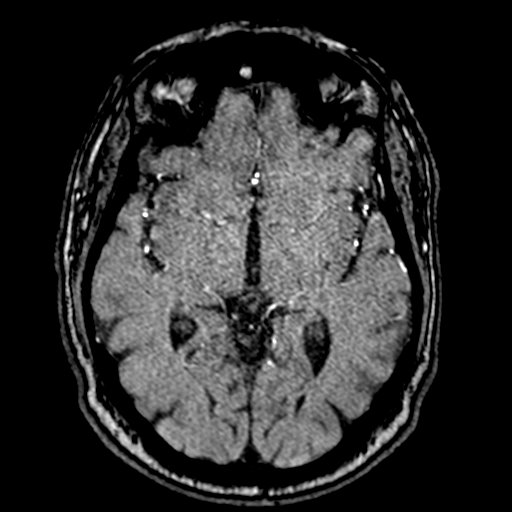
[im 142/172]
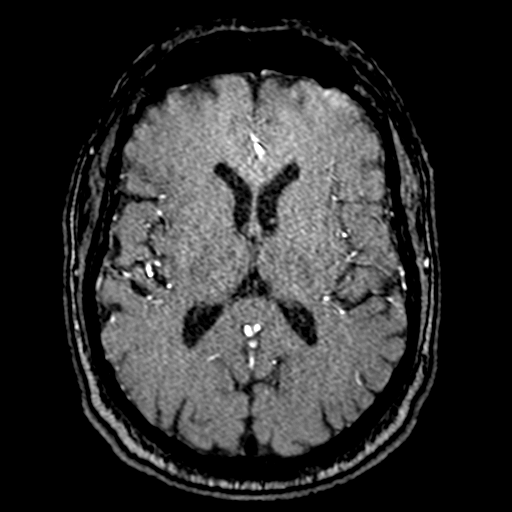
[im 146/172]
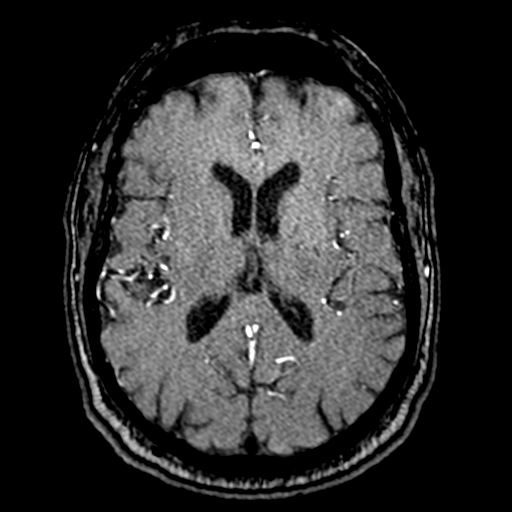
[im 164/172]
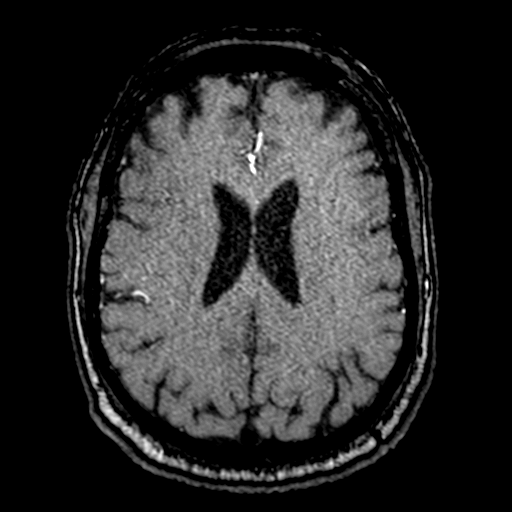

[20 of 48 positions shown; findings below may reference images not displayed]

FINDINGS: Anterior circulation: Atheromatous irregularity of the cavernous
carotids and bilateral MCA/ACA branches. High-grade right A2 branch
stenosis with flow gap. Negative for aneurysm or major branch
occlusion.

Posterior circulation: The vertebral and basilar arteries are
smoothly contoured and diffusely patent. Mild vertebral tortuosity.
Bilateral PCA atheromatous irregularity with high-grade left P2
segment stenosis causing flow gap.

Anatomic variants: None significant

Other: None.
IMPRESSION: 1. No emergent finding.
2. Generalized intracranial atherosclerosis with high-grade ACA and
left PCA narrowings.

## 2022-09-14 DIAGNOSIS — Z20822 Contact with and (suspected) exposure to covid-19: Secondary | ICD-10-CM | POA: Diagnosis not present

## 2022-09-14 DIAGNOSIS — J209 Acute bronchitis, unspecified: Secondary | ICD-10-CM | POA: Diagnosis not present

## 2022-09-30 ENCOUNTER — Other Ambulatory Visit: Payer: Self-pay | Admitting: Family Medicine

## 2022-09-30 DIAGNOSIS — I10 Essential (primary) hypertension: Secondary | ICD-10-CM

## 2022-10-12 NOTE — Progress Notes (Deleted)
  Cardiology Office Note:  .   Date:  10/12/2022  ID:  Morrison Old, DOB [birth year], MRN [id number] PCP: Latrelle Dodrill, MD  [facility] Providers Cardiologist:  None {  History of Present Illness: .   Jonathan [name] is a 72 y.o. male with a past medical history of hypertension, COPD, tobacco abuse, type 2 diabetes mellitus, anxiety, depression, HLD, and prior CVA who presents for follow-up appointment.  Was originally seen for preop evaluation 10/04/2021.  Plan was to have nephrectomy done of the renal mass.  No significant CV symptoms at that time however EKG showed biphasic T waves in precordial leads and TWI in lead I.  Recommended follow-up with cardiology.  When he was seen last in the clinic he was accompanied by his wife and daughter.  Patient stated he was feeling fine without any chest pain or shortness of breath.  Blood pressure was elevated in the clinic.  Currently smoking but was cutting back.  Borderline diabetes but metformin was discontinued due to diarrhea.  Denied palpitations, peripheral edema, headache, lightheadedness, dizziness, syncope, orthopnea, or PND.  No known CVD.  Today, he***  ROS: ***  Studies Reviewed: .        *** Risk Assessment/Calculations:   {Does this patient have ATRIAL FIBRILLATION?:[phone]} No BP recorded.  {Refresh Note OR Click here to enter BP  :1}***       Physical Exam:   VS:  There were no vitals taken for this visit.   Wt Readings from Last 3 Encounters:  12/28/21 207 lb 6.4 oz (94.1 kg)  10/13/21 201 lb (91.2 kg)  10/04/21 201 lb 6.4 oz (91.4 kg)    GEN: Well nourished, well developed in no acute distress NECK: No JVD; No carotid bruits CARDIAC: ***RRR, no murmurs, rubs, gallops RESPIRATORY:  Clear to auscultation without rales, wheezing or rhonchi  ABDOMEN: Soft, non-tender, non-distended EXTREMITIES:  No edema; No deformity   ASSESSMENT AND PLAN: .   1.  Prior CVA 2.  Abnormal EKG 3.  Hypertension 4.   Hyperlipidemia 5.  COPD 6.  Diabetes mellitus type 2 7.  CKD stage IIIa 8.  Tobacco use    {Are you ordering a CV Procedure (e.g. stress test, cath, DCCV, TEE, etc)?   Press F2        :[id number]}  Dispo: ***  Signed, Sharlene Dory, PA-C

## 2022-10-14 ENCOUNTER — Encounter: Payer: Self-pay | Admitting: Physician Assistant

## 2022-10-14 ENCOUNTER — Ambulatory Visit: Payer: 59 | Attending: Physician Assistant | Admitting: Physician Assistant

## 2022-10-14 DIAGNOSIS — Z720 Tobacco use: Secondary | ICD-10-CM

## 2022-10-14 DIAGNOSIS — Z79899 Other long term (current) drug therapy: Secondary | ICD-10-CM

## 2022-10-14 DIAGNOSIS — Z8673 Personal history of transient ischemic attack (TIA), and cerebral infarction without residual deficits: Secondary | ICD-10-CM

## 2022-10-14 DIAGNOSIS — E785 Hyperlipidemia, unspecified: Secondary | ICD-10-CM

## 2022-10-14 DIAGNOSIS — R9431 Abnormal electrocardiogram [ECG] [EKG]: Secondary | ICD-10-CM

## 2022-10-14 DIAGNOSIS — E119 Type 2 diabetes mellitus without complications: Secondary | ICD-10-CM

## 2022-10-14 DIAGNOSIS — I10 Essential (primary) hypertension: Secondary | ICD-10-CM

## 2022-11-05 ENCOUNTER — Other Ambulatory Visit: Payer: Self-pay | Admitting: Family Medicine

## 2022-11-05 DIAGNOSIS — I10 Essential (primary) hypertension: Secondary | ICD-10-CM

## 2022-11-07 ENCOUNTER — Ambulatory Visit: Payer: 59 | Admitting: Family Medicine

## 2022-11-14 ENCOUNTER — Encounter: Payer: Self-pay | Admitting: Family Medicine

## 2022-11-14 ENCOUNTER — Ambulatory Visit (INDEPENDENT_AMBULATORY_CARE_PROVIDER_SITE_OTHER): Payer: 59 | Admitting: Family Medicine

## 2022-11-14 VITALS — BP 195/101 | HR 60 | Ht 66.0 in | Wt 207.0 lb

## 2022-11-14 DIAGNOSIS — R9431 Abnormal electrocardiogram [ECG] [EKG]: Secondary | ICD-10-CM | POA: Diagnosis not present

## 2022-11-14 DIAGNOSIS — E119 Type 2 diabetes mellitus without complications: Secondary | ICD-10-CM | POA: Diagnosis not present

## 2022-11-14 DIAGNOSIS — I10 Essential (primary) hypertension: Secondary | ICD-10-CM

## 2022-11-14 DIAGNOSIS — Z79899 Other long term (current) drug therapy: Secondary | ICD-10-CM

## 2022-11-14 DIAGNOSIS — Z1211 Encounter for screening for malignant neoplasm of colon: Secondary | ICD-10-CM

## 2022-11-14 DIAGNOSIS — E11319 Type 2 diabetes mellitus with unspecified diabetic retinopathy without macular edema: Secondary | ICD-10-CM | POA: Diagnosis not present

## 2022-11-14 DIAGNOSIS — Z01810 Encounter for preprocedural cardiovascular examination: Secondary | ICD-10-CM

## 2022-11-14 DIAGNOSIS — R7989 Other specified abnormal findings of blood chemistry: Secondary | ICD-10-CM | POA: Diagnosis not present

## 2022-11-14 DIAGNOSIS — N2889 Other specified disorders of kidney and ureter: Secondary | ICD-10-CM

## 2022-11-14 DIAGNOSIS — Z8673 Personal history of transient ischemic attack (TIA), and cerebral infarction without residual deficits: Secondary | ICD-10-CM

## 2022-11-14 DIAGNOSIS — Z720 Tobacco use: Secondary | ICD-10-CM

## 2022-11-14 DIAGNOSIS — F17200 Nicotine dependence, unspecified, uncomplicated: Secondary | ICD-10-CM

## 2022-11-14 DIAGNOSIS — E785 Hyperlipidemia, unspecified: Secondary | ICD-10-CM | POA: Diagnosis not present

## 2022-11-14 LAB — POCT GLYCOSYLATED HEMOGLOBIN (HGB A1C): HbA1c, POC (controlled diabetic range): 9.3 % — AB (ref 0.0–7.0)

## 2022-11-14 MED ORDER — OZEMPIC (0.25 OR 0.5 MG/DOSE) 2 MG/3ML ~~LOC~~ SOPN: 0.2500 mg | PEN_INJECTOR | SUBCUTANEOUS | 1 refills | Status: DC

## 2022-11-14 MED ORDER — LISINOPRIL 40 MG PO TABS
40.0000 mg | ORAL_TABLET | Freq: Every day | ORAL | 0 refills | Status: DC
Start: 2022-11-14 — End: 2023-06-16

## 2022-11-14 MED ORDER — HYDROCHLOROTHIAZIDE 12.5 MG PO CAPS: 12.5000 mg | ORAL_CAPSULE | Freq: Every day | ORAL | 0 refills | Status: DC

## 2022-11-14 MED ORDER — ONETOUCH DELICA LANCETS 33G MISC
3 refills | Status: DC
Start: 2022-11-14 — End: 2023-07-03

## 2022-11-14 MED ORDER — HYDRALAZINE HCL 25 MG PO TABS
25.0000 mg | ORAL_TABLET | Freq: Three times a day (TID) | ORAL | 0 refills | Status: DC
Start: 2022-11-14 — End: 2023-08-01

## 2022-11-14 MED ORDER — TAMSULOSIN HCL 0.4 MG PO CAPS: 0.4000 mg | ORAL_CAPSULE | Freq: Every day | ORAL | 0 refills | Status: DC

## 2022-11-14 MED ORDER — ONETOUCH VERIO VI STRP
ORAL_STRIP | 3 refills | Status: DC
Start: 2022-11-14 — End: 2023-07-03

## 2022-11-14 MED ORDER — BUPROPION HCL ER (XL) 300 MG PO TB24
300.0000 mg | ORAL_TABLET | Freq: Every morning | ORAL | 0 refills | Status: DC
Start: 2022-11-14 — End: 2023-08-01

## 2022-11-14 MED ORDER — SPIRONOLACTONE 50 MG PO TABS
50.0000 mg | ORAL_TABLET | Freq: Every day | ORAL | 0 refills | Status: DC
Start: 2022-11-14 — End: 2023-08-01

## 2022-11-14 MED ORDER — ONETOUCH VERIO W/DEVICE KIT
PACK | 0 refills | Status: DC
Start: 2022-11-14 — End: 2023-07-03

## 2022-11-14 MED ORDER — CLOPIDOGREL BISULFATE 75 MG PO TABS: 75.0000 mg | ORAL_TABLET | Freq: Every day | ORAL | 0 refills | Status: DC

## 2022-11-14 MED ORDER — ONETOUCH DELICA LANCING DEV MISC
0 refills | Status: DC
Start: 2022-11-14 — End: 2023-07-03

## 2022-11-14 MED ORDER — AMLODIPINE BESYLATE 10 MG PO TABS: 10.0000 mg | ORAL_TABLET | Freq: Every day | ORAL | 0 refills | Status: DC

## 2022-11-14 NOTE — Progress Notes (Unsigned)
  Date of Visit: 11/14/2022   SUBJECTIVE:   HPI:  Jonathan [name] presents today for follow-up of medical issues.  Of note, this is the first time I am seeing him since April 2023.  He was only seen here once in the interim.  Diabetes: Not currently taking any diabetes medications.  Did not tolerate metformin in the past.  A1c today is 9.3.  He request prescription for a glucometer.  Is willing to do a weekly injection.  Hypertension: He is prescribed lisinopril 40 mg daily, hydralazine 25 mg 3 times a day, hydrochlorothiazide 12.5 mg daily, amlodipine 10 mg daily, and spironolactone 50 mg daily.  He reports he did not take his blood pressure medications yet today, but is generally compliant.  He does not check his blood pressures at home.  Prior tobacco use: Last smoking was in May 2023.  He is proud of himself for quitting.  History of stroke: It is unclear if he is taking aspirin or Plavix or both.  Previously he was recommended to be on just Plavix after his stroke.  Hyperlipidemia: Currently taking atorvastatin 80 mg daily.  He is fasting today  Renal mass: Has not followed up with urology.  Was not a candidate for surgery until he got his blood pressure under control, though has failed to follow-up here with [place].    OBJECTIVE:   BP (!) 195/101   Pulse 60   Ht 5\' 6"  (1.676 m)   Wt 207 lb (93.9 kg)   SpO2 98%   BMI 33.41 kg/m  Gen: No acute distress, pleasant, cooperative HEENT: Normocephalic, atraumatic Heart: Regular rate and rhythm, no murmur Lungs: Clear to auscultation bilaterally, normal effort Neuro: Grossly nonfocal, speech normal Ext: No edema bilateral lower extremities  ASSESSMENT/PLAN:   Health maintenance:  -Agreeable to annual wellness visit, we will get this scheduled -Check urine microalbumin today -Again ordered Cologuard for patient, stressed importance of colon cancer screening  Essential hypertension Uncontrolled, but did not take medications today.  Patient  will resume home medications and follow-up for recheck of blood pressure in several weeks.  Tobacco use disorder Congratulated him on continued cessation.  HLD (hyperlipidemia) Update lipids today, continue statin.  Type 2 diabetes mellitus (HCC) Uncontrolled with A1c of 9.3.  Previously did not tolerate metformin.  He is not interested in a CGM, so I sent in a CBG machine.  Starting Ozempic 0.25 mg weekly.  Follow-up with me in several weeks to see how he is doing on this.  History of stroke Plan to have him continue just on Plavix, though I see his cardiologist Dr. Shari Prows recommended he start aspirin.  I will clarify with her whether he needs to be on DAPT or just Plavix.  Kidney mass Reviewed with patient the importance of regular follow-up to get his blood pressure under control so that he can get surgery for this likely malignant renal mass.  He did not have any specific reasoning for why he has not followed up, but seems agreeable to continuing to follow-up regularly.  FOLLOW UP: Follow up in several weeks for above issues  [place] J. Pollie Meyer, MD [facility]

## 2022-11-14 NOTE — Patient Instructions (Addendum)
It was great to see you again today.  Checking labs today Ordered cologuard Ordered glucometer  Start ozempic 0.25mg  once per week Refilled your medications  Follow up with me in August as scheduled  Be well, Dr. Pollie Meyer

## 2022-11-15 LAB — CBC
Hematocrit: 47.5 % (ref 37.5–51.0)
Hemoglobin: 15.5 g/dL (ref 13.0–17.7)
MCH: 29.2 pg (ref 26.6–33.0)
MCHC: 32.6 g/dL (ref 31.5–35.7)
MCV: 90 fL (ref 79–97)
Platelets: 228 10*3/uL (ref 150–450)
RBC: 5.31 x10E6/uL (ref 4.14–5.80)
RDW: 12.8 % (ref 11.6–15.4)
WBC: 5.7 10*3/uL (ref 3.4–10.8)

## 2022-11-15 LAB — CMP14+EGFR
ALT: 54 IU/L — ABNORMAL HIGH (ref 0–44)
AST: 45 IU/L — ABNORMAL HIGH (ref 0–40)
Albumin: 4.6 g/dL (ref 3.8–4.8)
Alkaline Phosphatase: 115 IU/L (ref 44–121)
BUN/Creatinine Ratio: 15 (ref 10–24)
BUN: 18 mg/dL (ref 8–27)
Bilirubin Total: 0.6 mg/dL (ref 0.0–1.2)
CO2: 22 mmol/L (ref 20–29)
Calcium: 9.4 mg/dL (ref 8.6–10.2)
Chloride: 98 mmol/L (ref 96–106)
Creatinine, Ser: 1.23 mg/dL (ref 0.76–1.27)
Globulin, Total: 3.4 g/dL (ref 1.5–4.5)
Glucose: 235 mg/dL — ABNORMAL HIGH (ref 70–99)
Potassium: 4.6 mmol/L (ref 3.5–5.2)
Sodium: 135 mmol/L (ref 134–144)
Total Protein: 8 g/dL (ref 6.0–8.5)
eGFR: 63 mL/min/{1.73_m2} (ref >59)

## 2022-11-15 LAB — LIPID PANEL
Chol/HDL Ratio: 7.1 ratio — ABNORMAL HIGH (ref 0.0–5.0)
Cholesterol, Total: 227 mg/dL — ABNORMAL HIGH (ref 100–199)
HDL: 32 mg/dL — ABNORMAL LOW (ref >39)
LDL Chol Calc (NIH): 150 mg/dL — ABNORMAL HIGH (ref 0–99)
Triglycerides: 245 mg/dL — ABNORMAL HIGH (ref 0–149)
VLDL Cholesterol Cal: 45 mg/dL — ABNORMAL HIGH (ref 5–40)

## 2022-11-15 LAB — MICROALBUMIN / CREATININE URINE RATIO
Creatinine, Urine: 92.6 mg/dL
Microalb/Creat Ratio: 15 mg/g creat (ref 0–29)
Microalbumin, Urine: 14 ug/mL

## 2022-11-15 NOTE — Assessment & Plan Note (Signed)
Update lipids today, continue statin 

## 2022-11-15 NOTE — Assessment & Plan Note (Addendum)
Uncontrolled, but did not take medications today.  Patient will resume home medications and follow-up for recheck of blood pressure in several weeks.

## 2022-11-15 NOTE — Assessment & Plan Note (Signed)
Congratulated him on continued cessation.

## 2022-11-15 NOTE — Assessment & Plan Note (Addendum)
Uncontrolled with A1c of 9.3.  Previously did not tolerate metformin.  He is not interested in a CGM, so I sent in a CBG machine.  Starting Ozempic 0.25 mg weekly.  Follow-up with me in several weeks to see how he is doing on this.

## 2022-11-15 NOTE — Assessment & Plan Note (Signed)
Reviewed with patient the importance of regular follow-up to get his blood pressure under control so that he can get surgery for this likely malignant renal mass.  He did not have any specific reasoning for why he has not followed up, but seems agreeable to continuing to follow-up regularly.

## 2022-11-15 NOTE — Assessment & Plan Note (Signed)
Plan to have him continue just on Plavix, though I see his cardiologist Dr. Shari Prows recommended he start aspirin.  I will clarify with her whether he needs to be on DAPT or just Plavix.

## 2022-11-20 NOTE — Patient Instructions (Signed)
Jonathan [name] , Thank you for taking time to come for your Medicare Wellness Visit. I appreciate your ongoing commitment to your health goals. Please review the following plan we discussed and let me know if I can assist you in the future.   Referrals/Orders/Follow-Ups/Clinician Recommendations: Aim for 30 minutes of exercise or brisk walking, 6-8 glasses of water, and 5 servings of fruits and vegetables each day.   This is a list of the screening recommended for you and due dates:  Health Maintenance  Topic Date Due   Zoster (Shingles) Vaccine (1 of 2) 05/13/1970   Colon Cancer Screening  Never done   DTaP/Tdap/Td vaccine (2 - Td or Tdap) 11/02/2020   COVID-19 Vaccine (6 - 2023-24 season) 12/24/2021   Complete foot exam   03/23/2022   Screening for Lung Cancer  04/07/2022   Flu Shot  11/24/2022   Eye exam for diabetics  12/11/2022   Hemoglobin A1C  05/17/2023   Yearly kidney function blood test for diabetes  11/14/2023   Yearly kidney health urinalysis for diabetes  11/14/2023   Medicare Annual Wellness Visit  11/21/2023   Pneumonia Vaccine  Completed   Hepatitis C Screening  Completed   HPV Vaccine  Aged Out    Advanced directives: (ACP Link)Information on Advanced Care Planning can be found at [facility] Advance Health Care Directives Advance Health Care Directives ([email or web address]   Next Medicare Annual Wellness Visit scheduled for next year: Yes  Preventive Care 65 Years and Older, Male  Preventive care refers to lifestyle choices and visits with your health care provider that can promote health and wellness. What does preventive care include? A yearly physical exam. This is also called an annual well check. Dental exams once or twice a year. Routine eye exams. Ask your health care provider how often you should have your eyes checked. Personal lifestyle choices, including: Daily care of your teeth and gums. Regular physical activity. Eating a healthy  diet. Avoiding tobacco and drug use. Limiting alcohol use. Practicing safe sex. Taking low doses of aspirin every day. Taking vitamin and mineral supplements as recommended by your health care provider. What happens during an annual well check? The services and screenings done by your health care provider during your annual well check will depend on your age, overall health, lifestyle risk factors, and family history of disease. Counseling  Your health care provider may ask you questions about your: Alcohol use. Tobacco use. Drug use. Emotional well-being. Home and relationship well-being. Sexual activity. Eating habits. History of falls. Memory and ability to understand (cognition). Work and work Astronomer. Screening  You may have the following tests or measurements: Height, weight, and BMI. Blood pressure. Lipid and cholesterol levels. These may be checked every 5 years, or more frequently if you are over 89 years old. Skin check. Lung cancer screening. You may have this screening every year starting at age 74 if you have a 30-pack-year history of smoking and currently smoke or have quit within the past 15 years. Fecal occult blood test (FOBT) of the stool. You may have this test every year starting at age 35. Flexible sigmoidoscopy or colonoscopy. You may have a sigmoidoscopy every 5 years or a colonoscopy every 10 years starting at age 66. Prostate cancer screening. Recommendations will vary depending on your family history and other risks. Hepatitis C blood test. Hepatitis B blood test. Sexually transmitted disease (STD) testing. Diabetes screening. This is done by checking your blood sugar (glucose) after  you have not eaten for a while (fasting). You may have this done every 1-3 years. Abdominal aortic aneurysm (AAA) screening. You may need this if you are a current or former smoker. Osteoporosis. You may be screened starting at age 44 if you are at high risk. Talk with  your health care provider about your test results, treatment options, and if necessary, the need for more tests. Vaccines  Your health care provider may recommend certain vaccines, such as: Influenza vaccine. This is recommended every year. Tetanus, diphtheria, and acellular pertussis (Tdap, Td) vaccine. You may need a Td booster every 10 years. Zoster vaccine. You may need this after age 30. Pneumococcal 13-valent conjugate (PCV13) vaccine. One dose is recommended after age 78. Pneumococcal polysaccharide (PPSV23) vaccine. One dose is recommended after age 30. Talk to your health care provider about which screenings and vaccines you need and how often you need them. This information is not intended to replace advice given to you by your health care provider. Make sure you discuss any questions you have with your health care provider. Document Released: 05/08/2015 Document Revised: 12/30/2015 Document Reviewed: 02/10/2015 Elsevier Interactive Patient Education  2017 [employer].  Fall Prevention in the Home Falls can cause injuries. They can happen to people of all ages. There are many things you can do to make your home safe and to help prevent falls. What can I do on the outside of my home? Regularly fix the edges of walkways and driveways and fix any cracks. Remove anything that might make you trip as you walk through a door, such as a raised step or threshold. Trim any bushes or trees on the path to your home. Use bright outdoor lighting. Clear any walking paths of anything that might make someone trip, such as rocks or tools. Regularly check to see if handrails are loose or broken. Make sure that both sides of any steps have handrails. Any raised decks and porches should have guardrails on the edges. Have any leaves, snow, or ice cleared regularly. Use sand or salt on walking paths during winter. Clean up any spills in your garage right away. This includes oil or grease spills. What  can I do in the bathroom? Use night lights. Install grab bars by the toilet and in the tub and shower. Do not use towel bars as grab bars. Use non-skid mats or decals in the tub or shower. If you need to sit down in the shower, use a plastic, non-slip stool. Keep the floor dry. Clean up any water that spills on the floor as soon as it happens. Remove soap buildup in the tub or shower regularly. Attach bath mats securely with double-sided non-slip rug tape. Do not have throw rugs and other things on the floor that can make you trip. What can I do in the bedroom? Use night lights. Make sure that you have a light by your bed that is easy to reach. Do not use any sheets or blankets that are too big for your bed. They should not hang down onto the floor. Have a firm chair that has side arms. You can use this for support while you get dressed. Do not have throw rugs and other things on the floor that can make you trip. What can I do in the kitchen? Clean up any spills right away. Avoid walking on wet floors. Keep items that you use a lot in easy-to-reach places. If you need to reach something above you, use a strong  step stool that has a grab bar. Keep electrical cords out of the way. Do not use floor polish or wax that makes floors slippery. If you must use wax, use non-skid floor wax. Do not have throw rugs and other things on the floor that can make you trip. What can I do with my stairs? Do not leave any items on the stairs. Make sure that there are handrails on both sides of the stairs and use them. Fix handrails that are broken or loose. Make sure that handrails are as long as the stairways. Check any carpeting to make sure that it is firmly attached to the stairs. Fix any carpet that is loose or worn. Avoid having throw rugs at the top or bottom of the stairs. If you do have throw rugs, attach them to the floor with carpet tape. Make sure that you have a light switch at the top of the  stairs and the bottom of the stairs. If you do not have them, ask someone to add them for you. What else can I do to help prevent falls? Wear shoes that: Do not have high heels. Have rubber bottoms. Are comfortable and fit you well. Are closed at the toe. Do not wear sandals. If you use a stepladder: Make sure that it is fully opened. Do not climb a closed stepladder. Make sure that both sides of the stepladder are locked into place. Ask someone to hold it for you, if possible. Clearly mark and make sure that you can see: Any grab bars or handrails. First and last steps. Where the edge of each step is. Use tools that help you move around (mobility aids) if they are needed. These include: Canes. Walkers. Scooters. Crutches. Turn on the lights when you go into a dark area. Replace any light bulbs as soon as they burn out. Set up your furniture so you have a clear path. Avoid moving your furniture around. If any of your floors are uneven, fix them. If there are any pets around you, be aware of where they are. Review your medicines with your doctor. Some medicines can make you feel dizzy. This can increase your chance of falling. Ask your doctor what other things that you can do to help prevent falls. This information is not intended to replace advice given to you by your health care provider. Make sure you discuss any questions you have with your health care provider. Document Released: 02/05/2009 Document Revised: 09/17/2015 Document Reviewed: 05/16/2014 Elsevier Interactive Patient Education  2017 [employer].

## 2022-11-20 NOTE — Progress Notes (Signed)
Subjective:   AMARIOUS BERST is a 72 y.o. male who presents for an Initial Medicare Annual Wellness Visit.  Visit Complete: {[phone]}  Patient Medicare AWV questionnaire was completed by the patient on ***; I have confirmed that all information answered by patient is correct and no changes since this date.  Review of Systems    ***   Vital Signs: {telehealth vitals:30100}      Objective:    There were no vitals filed for this visit. There is no height or weight on file to calculate BMI.     08/17/2021   10:07 AM 06/02/2021   11:00 AM 10/13/2020    1:39 PM 05/19/2020   10:19 AM 03/26/2020   10:22 AM 01/09/2020   10:46 AM 06/11/2019    9:17 AM  Advanced Directives  Does Patient Have a Medical Advance Directive? No No No No No No No  Would patient like information on creating a medical advance directive? No - Patient declined No - Patient declined No - Patient declined No - Patient declined No - Patient declined No - Patient declined No - Patient declined    Current Medications (verified) Outpatient Encounter Medications as of 11/21/2022  Medication Sig   albuterol (VENTOLIN HFA) 108 (90 Base) MCG/ACT inhaler Inhale 2 puffs into the lungs every 6 (six) hours as needed.   amLODipine (NORVASC) 10 MG tablet Take 1 tablet (10 mg total) by mouth daily.   aspirin EC 81 MG tablet Take 1 tablet (81 mg total) by mouth daily. Swallow whole.   atorvastatin (LIPITOR) 80 MG tablet Take 1 tablet (80 mg total) by mouth daily.   Blood Glucose Monitoring Suppl (ONETOUCH VERIO) w/Device KIT Check blood sugar once daily   Blood Pressure Monitoring (3 SERIES BP MONITOR/UPPER ARM) DEVI Check blood pressure once a day   buPROPion (WELLBUTRIN XL) 300 MG 24 hr tablet Take 1 tablet (300 mg total) by mouth every morning.   clopidogrel (PLAVIX) 75 MG tablet Take 1 tablet (75 mg total) by mouth daily.   fluticasone (FLONASE) 50 MCG/ACT nasal spray Place 1 spray into both nostrils daily.    glucose blood (ONETOUCH VERIO) test strip Check blood sugar once daily   hydrALAZINE (APRESOLINE) 25 MG tablet Take 1 tablet (25 mg total) by mouth 3 (three) times daily.   hydrochlorothiazide (MICROZIDE) 12.5 MG capsule Take 1 capsule (12.5 mg total) by mouth daily.   Lancet Devices (ONE TOUCH DELICA LANCING DEV) MISC Check blood sugar once daily   lisinopril (ZESTRIL) 40 MG tablet Take 1 tablet (40 mg total) by mouth daily.   OneTouch Delica Lancets 33G MISC Check blood sugar once daily   Semaglutide,0.25 or 0.5MG /DOS, (OZEMPIC, 0.25 OR 0.5 MG/DOSE,) 2 MG/3ML SOPN Inject 0.25 mg into the skin once a week.   spironolactone (ALDACTONE) 50 MG tablet Take 1 tablet (50 mg total) by mouth daily.   tamsulosin (FLOMAX) 0.4 MG CAPS capsule Take 1 capsule (0.4 mg total) by mouth daily.   No facility-administered encounter medications on file as of 11/21/2022.    Allergies (verified) Patient has no known allergies.   History: Past Medical History:  Diagnosis Date   Anxiety    Arthritis    COPD (chronic obstructive pulmonary disease) (HCC)    Depression    Diabetes mellitus without complication (HCC)    Diarrhea    History of kidney stones    HTN (hypertension)    Hyperlipidemia    Hypertensive retinopathy    OU  Pneumonia    Retinal detachment    Rheg. RD OD   Sleep apnea    does not use cpap   Stroke [facility])    denies any deficits   Past Surgical History:  Procedure Laterality Date   CATARACT EXTRACTION Bilateral    Dr. Jana Hakim     EYE SURGERY Bilateral    Cat Sx OU; RD repair OD   GAS INSERTION Right 03/13/2019   Procedure: INSERTION OF GAS (C3F8) RIGHT EYE;  Surgeon: Rennis Chris, MD;  Location: [facility] OR;  Service: Ophthalmology;  Laterality: Right;   LAPAROSCOPIC APPENDECTOMY N/A 10/03/2020   Procedure: APPENDECTOMY LAPAROSCOPIC;  Surgeon: Axel Filler, MD;  Location: [facility] OR;  Service: General;  Laterality: N/A;   LASER PHOTO ABLATION Right 03/13/2019    Procedure: LASER PHOTO  ABLATION RIGHT EYE;  Surgeon: Rennis Chris, MD;  Location: [facility] OR;  Service: Ophthalmology;  Laterality: Right;   none     PARS PLANA VITRECTOMY Right 03/13/2019   Procedure: 25 GAUGE PARS PLANA VITRECTOMY WITH  INTRAOCULAR LENSE EXPLANTATION RIGHT EYE.;  Surgeon: Rennis Chris, MD;  Location: [facility] OR;  Service: Ophthalmology;  Laterality: Right;   PARS PLANA VITRECTOMY Right 06/27/2019   Procedure: PARS PLANA VITRECTOMY WITH 25 GAUGE;  Surgeon: Rennis Chris, MD;  Location: [facility] OR;  Service: Ophthalmology;  Laterality: Right;   PHOTOCOAGULATION WITH LASER Left 03/13/2019   Procedure: INDIRECT PHOTOCOAGULATION WITH LASER LEFT EYE;  Surgeon: Rennis Chris, MD;  Location: [facility] OR;  Service: Ophthalmology;  Laterality: Left;   PLACEMENT AND SUTURE OF SECONDARY INTRAOCULAR LENS Right 06/27/2019   Procedure: PLACEMENT AND SUTURE OF SECONDARY INTRAOCULAR LENS;  Surgeon: Rennis Chris, MD;  Location: [facility] OR;  Service: Ophthalmology;  Laterality: Right;   RETINAL DETACHMENT SURGERY Right 03/13/2019   PPV for repair of rheg. RD - Dr. Rennis Chris   Family History  Problem Relation Age of Onset   Cancer Mother        brain tumor   Heart disease Father    Cirrhosis Father    Social History   Socioeconomic History   Marital status: Married    Spouse name: Not on file   Number of children: 7   Years of education: Not on file   Highest education level: Not on file  Occupational History   Occupation: welder  Tobacco Use   Smoking status: Former    Current packs/day: 0.00    Average packs/day: 1 pack/day for 50.0 years (50.0 ttl pk-yrs)    Types: Cigarettes    Quit date: 08/2021    Years since quitting: 1.2   Smokeless tobacco: Never   Tobacco comments:       Vaping Use   Vaping status: Never Used  Substance and Sexual Activity   Alcohol use: No   Drug use: No   Sexual activity: Not on file  Other Topics Concern   Not on file  Social History Narrative   Unemployed.  Currently in process of applying for disability.    Social Determinants of Health   Financial Resource Strain: Not on file  Food Insecurity: Not on file  Transportation Needs: Not on file  Physical Activity: Not on file  Stress: Not on file  Social Connections: Not on file    Tobacco Counseling Counseling given: Not Answered Tobacco comments:     Clinical Intake:  Activities of Daily Living     No data to display          Patient Care Team: Latrelle Dodrill, MD as PCP - General (Family Medicine)  Indicate any recent Medical Services you may have received from other than [facility] providers in the past year (date may be approximate).     Assessment:   This is a routine wellness examination for Ch[place].  Hearing/Vision screen No results found.  Dietary issues and exercise activities discussed:     Goals Addressed   None    Depression Screen    11/14/2022   10:57 AM 08/17/2021   10:06 AM 06/02/2021   11:00 AM 05/19/2020   10:18 AM 03/26/2020   10:22 AM 01/09/2020   10:45 AM 06/11/2019    9:17 AM  PHQ 2/9 Scores  PHQ - 2 Score 4 4 2 3 2 3  0  PHQ- 9 Score 13 15 10 17 9 13      Fall Risk    11/14/2022   10:41 AM 08/17/2021   10:06 AM 06/02/2021   11:00 AM 10/13/2020    1:39 PM 05/19/2020   10:18 AM  Fall Risk   Falls in the past year? 0 0 0 0 0  Number falls in past yr: 0 0 0 0 0  Injury with Fall? 0 0 0 0 0  Risk for fall due to : No Fall Risks      Follow up Falls evaluation completed        MEDICARE RISK AT HOME:   TIMED UP AND GO:  Was the test performed? No    Cognitive Function:        Immunizations Immunization History  Administered Date(s) Administered   Fluad Quad(high Dose 65+) 01/09/2020, 03/23/2021   Influenza Split 01/17/2011   Influenza, High Dose Seasonal PF 03/02/2017, 01/25/2018   Influenza,inj,Quad PF,6+ Mos 12/31/2014, 03/28/2016, 03/20/2019   PFIZER Comirnaty(Gray Top)Covid-19 Tri-Sucrose  Vaccine 10/13/2020   PFIZER(Purple Top)SARS-COV-2 Vaccination 07/20/2019, 08/10/2019, 03/26/2020   Pfizer Covid-19 Vaccine Bivalent Booster 71yrs & up 03/23/2021   Pneumococcal Conjugate-13 03/02/2017   Pneumococcal Polysaccharide-23 07/20/2011, 01/09/2020   Tdap 11/03/2010   Zoster, Live 03/28/2016    TDAP status: Due, Education has been provided regarding the importance of this vaccine. Advised may receive this vaccine at local pharmacy or Health Dept. Aware to provide a copy of the vaccination record if obtained from local pharmacy or Health Dept. Verbalized acceptance and understanding.  Pneumococcal vaccine status: Up to date  Covid-19 vaccine status: Information provided on how to obtain vaccines.   Qualifies for Shingles Vaccine? Yes   Zostavax completed No   Shingrix Completed?: No.    Education has been provided regarding the importance of this vaccine. Patient has been advised to call insurance company to determine out of pocket expense if they have not yet received this vaccine. Advised may also receive vaccine at local pharmacy or Health Dept. Verbalized acceptance and understanding.  Screening Tests Health Maintenance  Topic Date Due   Medicare Annual Wellness (AWV)  Never done   Zoster Vaccines- Shingrix (1 of 2) 05/13/1970   Colonoscopy  Never done   DTaP/Tdap/Td (2 - Td or Tdap) 11/02/2020   COVID-19 Vaccine (6 - 2023-24 season) 12/24/2021   FOOT EXAM  03/23/2022   Lung Cancer Screening  04/07/2022   INFLUENZA VACCINE  11/24/2022   OPHTHALMOLOGY EXAM  12/11/2022   HEMOGLOBIN A1C  05/17/2023   Diabetic kidney evaluation - eGFR measurement  11/14/2023  Diabetic kidney evaluation - Urine ACR  11/14/2023   Pneumonia Vaccine 107+ Years old  Completed   Hepatitis C Screening  Completed   HPV VACCINES  Aged Out    Health Maintenance  Health Maintenance Due  Topic Date Due   Medicare Annual Wellness (AWV)  Never done   Zoster Vaccines- Shingrix (1 of 2) 05/13/1970    Colonoscopy  Never done   DTaP/Tdap/Td (2 - Td or Tdap) 11/02/2020   COVID-19 Vaccine (6 - 2023-24 season) 12/24/2021   FOOT EXAM  03/23/2022   Lung Cancer Screening  04/07/2022    {Colorectal cancer screening:2101809}  Lung Cancer Screening: (Low Dose CT Chest recommended if Age 14-80 years, 20 pack-year currently smoking OR have quit w/in 15years.) does not qualify.   Lung Cancer Screening Referral: n/a  Additional Screening:  Hepatitis C Screening: does qualify; Completed 03/26/20  Vision Screening: Recommended annual ophthalmology exams for early detection of glaucoma and other disorders of the eye. Is the patient up to date with their annual eye exam?  Yes  Who is the provider or what is the name of the office in which the patient attends annual eye exams? Dr. Aletta Edouard  If pt is not established with a provider, would they like to be referred to a provider to establish care? No .   Dental Screening: Recommended annual dental exams for proper oral hygiene  Diabetic Foot Exam: Diabetic Foot Exam: Overdue, Pt has been advised about the importance in completing this exam. Pt is scheduled for diabetic foot exam on at next office visit.  Community Resource Referral / Chronic Care Management: CRR required this visit?  {YES/NO:21197}  CCM required this visit?  {CCM Required [phone]}    Plan:     I have personally reviewed and noted the following in the patient's chart:   Medical and social history Use of alcohol, tobacco or illicit drugs  Current medications and supplements including opioid prescriptions. {Opioid [phone]} Functional ability and status Nutritional status Physical activity Advanced directives List of other physicians Hospitalizations, surgeries, and ER visits in previous 12 months Vitals Screenings to include cognitive, depression, and falls Referrals and appointments  In addition, I have reviewed and discussed with patient  certain preventive protocols, quality metrics, and best practice recommendations. A written personalized care plan for preventive services as well as general preventive health recommendations were provided to patient.     Kandis Fantasia [place], [place]   1/61/0960   After Visit Summary: {CHL AMB AWV After Visit Summary:[id number]}  Nurse Notes: ***

## 2022-11-21 ENCOUNTER — Ambulatory Visit (INDEPENDENT_AMBULATORY_CARE_PROVIDER_SITE_OTHER): Payer: 59

## 2022-11-21 VITALS — Ht 66.0 in | Wt 207.0 lb

## 2022-11-21 DIAGNOSIS — Z0000 Encounter for general adult medical examination without abnormal findings: Secondary | ICD-10-CM

## 2022-11-23 ENCOUNTER — Encounter: Payer: Self-pay | Admitting: Family Medicine

## 2022-12-12 ENCOUNTER — Ambulatory Visit: Payer: 59 | Admitting: Family Medicine

## 2023-01-09 ENCOUNTER — Other Ambulatory Visit: Payer: Self-pay

## 2023-01-09 ENCOUNTER — Encounter: Payer: Self-pay | Admitting: Family Medicine

## 2023-01-09 ENCOUNTER — Ambulatory Visit (INDEPENDENT_AMBULATORY_CARE_PROVIDER_SITE_OTHER): Payer: 59 | Admitting: Family Medicine

## 2023-01-09 VITALS — BP 153/81 | HR 65 | Ht 66.0 in | Wt 206.2 lb

## 2023-01-09 DIAGNOSIS — E1169 Type 2 diabetes mellitus with other specified complication: Secondary | ICD-10-CM | POA: Diagnosis not present

## 2023-01-09 DIAGNOSIS — I10 Essential (primary) hypertension: Secondary | ICD-10-CM | POA: Diagnosis not present

## 2023-01-09 DIAGNOSIS — N2889 Other specified disorders of kidney and ureter: Secondary | ICD-10-CM

## 2023-01-09 DIAGNOSIS — Z23 Encounter for immunization: Secondary | ICD-10-CM

## 2023-01-09 DIAGNOSIS — E785 Hyperlipidemia, unspecified: Secondary | ICD-10-CM | POA: Diagnosis not present

## 2023-01-09 DIAGNOSIS — R7401 Elevation of levels of liver transaminase levels: Secondary | ICD-10-CM | POA: Diagnosis not present

## 2023-01-09 MED ORDER — ATORVASTATIN CALCIUM 80 MG PO TABS
80.0000 mg | ORAL_TABLET | Freq: Every day | ORAL | 3 refills | Status: DC
Start: 2023-01-09 — End: 2023-10-13

## 2023-01-09 MED ORDER — HYDROCHLOROTHIAZIDE 25 MG PO TABS: 25.0000 mg | ORAL_TABLET | Freq: Every day | ORAL | 0 refills | Status: DC

## 2023-01-09 NOTE — Patient Instructions (Signed)
It was great to see you again today.  We will check into cologuard status Schedule your eye exam Flu shot today Rechecking liver function today Please call [facility] for a follow-up appointment for your kidney cancer Restart your atorvastatin 80 mg daily Stop Ozempic Increase HCTZ to 25 mg daily Follow-up with me in 1 month  Be well, Dr. Pollie Meyer

## 2023-01-09 NOTE — Progress Notes (Unsigned)
  Date of Visit: 01/09/2023   SUBJECTIVE:   HPI:  Jonathan [name] presents today for routine follow up.  Diabetes - at last visit we had started ozempic 0.25mg  weekly. He has had nausea and indigestion since then and wants to stop the medication.  Renal mass - is now willing to follow up with urology; agreeable to calling them to schedule.  Hyperlipidemia - unclear if he has been taking his atorvastatin. Amenable to new rx and starting it either way.  Hypertension - currently taking amlodipine 10mg  daily, hydralazine 25mg  three times daily, hydrochlorothiazide 12.5mg  daily, lisinopril 40mg  daily, spironolactone 50mg  daily.  Patient mentions intermittent episodes of fecal incontinence over the last year. Often when he thinks he is passing gas and then soils himself. He is able to control his bowels other times.  OBJECTIVE:   BP (!) 148/71   Pulse 65   Ht 5\' 6"  (1.676 m)   Wt 206 lb 3.2 oz (93.5 kg)   SpO2 97%   BMI 33.28 kg/m  Gen: no acute distress, pleasant, cooperative HEENT: normocephalic, atraumatic  Heart: regular rate and rhythm, no murmur Lungs: clear to auscultation bilaterally, normal work of breathing  Neuro: alert speech normal grossly nonfocal Ext: No appreciable lower extremity edema bilaterally  ASSESSMENT/PLAN:   Health maintenance:  -flu shot today -advised to schedule eye exam -will check into why he has not received cologuard kit  Assessment & Plan Transaminitis Recheck LFTs today Kidney mass Advised to schedule a follow up appointment with [facility].  Type 2 diabetes mellitus with other specified complication, without long-term current use of insulin (HCC) Has not tolerated GLP-1. Suspect he may end up needing insulin. Stop ozempic for now. Readdress in 1 month at follow up  Essential hypertension Uncontrolled. Increase hydrochlorothiazide to 25mg  daily. Plan to recheck labs in 1 month at follow up. Hyperlipidemia, unspecified hyperlipidemia  type Needs statin. Sent in new rx for atorv 80mg  daily.   Fecal incontinence - brought up by patient at very end of visit. Reassuring that he is at times continent. Recommend caution when he passes gas. Consider rectal exam at future visit.  FOLLOW UP: Follow up in 9mo for above issues  [place] J. Pollie Meyer, MD [facility]

## 2023-01-10 LAB — CMP14+EGFR
ALT: 34 IU/L (ref 0–44)
AST: 23 IU/L (ref 0–40)
Albumin: 4.4 g/dL (ref 3.8–4.8)
Alkaline Phosphatase: 103 IU/L (ref 44–121)
BUN/Creatinine Ratio: 14 (ref 10–24)
BUN: 20 mg/dL (ref 8–27)
Bilirubin Total: 0.3 mg/dL (ref 0.0–1.2)
CO2: 19 mmol/L — ABNORMAL LOW (ref 20–29)
Calcium: 9.6 mg/dL (ref 8.6–10.2)
Chloride: 101 mmol/L (ref 96–106)
Creatinine, Ser: 1.46 mg/dL — ABNORMAL HIGH (ref 0.76–1.27)
Globulin, Total: 2.9 g/dL (ref 1.5–4.5)
Glucose: 257 mg/dL — ABNORMAL HIGH (ref 70–99)
Potassium: 4.5 mmol/L (ref 3.5–5.2)
Sodium: 136 mmol/L (ref 134–144)
Total Protein: 7.3 g/dL (ref 6.0–8.5)
eGFR: 51 mL/min/{1.73_m2} — ABNORMAL LOW (ref >59)

## 2023-01-11 NOTE — Assessment & Plan Note (Signed)
Has not tolerated GLP-1. Suspect he may end up needing insulin. Stop ozempic for now. Readdress in 1 month at follow up

## 2023-01-11 NOTE — Assessment & Plan Note (Signed)
Uncontrolled. Increase hydrochlorothiazide to 25mg  daily. Plan to recheck labs in 1 month at follow up.

## 2023-01-11 NOTE — Assessment & Plan Note (Signed)
Needs statin. Sent in new rx for atorv 80mg  daily.

## 2023-01-11 NOTE — Assessment & Plan Note (Signed)
Advised to schedule a follow up appointment with [facility].

## 2023-01-24 ENCOUNTER — Encounter: Payer: Self-pay | Admitting: Family Medicine

## 2023-02-02 ENCOUNTER — Other Ambulatory Visit (HOSPITAL_COMMUNITY): Payer: Self-pay

## 2023-03-15 ENCOUNTER — Other Ambulatory Visit: Payer: Self-pay | Admitting: Family Medicine

## 2023-03-21 ENCOUNTER — Telehealth: Payer: Self-pay

## 2023-03-21 DIAGNOSIS — N2889 Other specified disorders of kidney and ureter: Secondary | ICD-10-CM

## 2023-03-21 NOTE — Telephone Encounter (Signed)
Rosey Bath returns call to nurse line.   She reports he can not go to [facility] due to an outstanding bill.   She reports he will need to be referred elsewhere.   Advised will forward to PCP.

## 2023-03-21 NOTE — Telephone Encounter (Signed)
Can you provide them with the phone # for [facility] and ask them to call and schedule? If that office requires a new referral we can place it but he has been seen there so I don't think he will require a new one  Thanks Latrelle Dodrill, MD

## 2023-03-21 NOTE — Telephone Encounter (Signed)
Su Monks, however no answer.   VM left asking her to call me back to discuss.

## 2023-03-21 NOTE — Telephone Encounter (Signed)
Referral entered to [facility].  Latrelle Dodrill, MD

## 2023-03-21 NOTE — Addendum Note (Signed)
Addended by: Latrelle Dodrill on: 03/21/2023 03:26 PM   Modules accepted: Orders

## 2023-03-21 NOTE — Telephone Encounter (Signed)
Patients wife sent a mychart message under her name in regards to a referral.   She reports PCP was going to refer him to a Kidney Specialist for the mass found.  It appears he was to contact [facility] to followup.   Will forward to PCP for advisement.

## 2023-05-11 DIAGNOSIS — N2889 Other specified disorders of kidney and ureter: Secondary | ICD-10-CM | POA: Diagnosis not present

## 2023-05-30 DIAGNOSIS — N2889 Other specified disorders of kidney and ureter: Secondary | ICD-10-CM | POA: Diagnosis not present

## 2023-06-16 ENCOUNTER — Other Ambulatory Visit: Payer: Self-pay

## 2023-06-16 DIAGNOSIS — I10 Essential (primary) hypertension: Secondary | ICD-10-CM

## 2023-06-20 MED ORDER — AMLODIPINE BESYLATE 10 MG PO TABS: 10.0000 mg | ORAL_TABLET | Freq: Every day | ORAL | 0 refills | Status: DC

## 2023-06-20 MED ORDER — LISINOPRIL 40 MG PO TABS: 40.0000 mg | ORAL_TABLET | Freq: Every day | ORAL | 0 refills | Status: DC

## 2023-06-20 NOTE — Telephone Encounter (Signed)
Please let patient know I am refilling this medication, but he needs to schedule an appointment with me.   Thanks, Jakorian Marengo J Jodiann Ognibene, MD  

## 2023-07-03 ENCOUNTER — Encounter: Payer: Self-pay | Admitting: Family Medicine

## 2023-07-03 ENCOUNTER — Ambulatory Visit (INDEPENDENT_AMBULATORY_CARE_PROVIDER_SITE_OTHER): Payer: 59 | Admitting: Family Medicine

## 2023-07-03 VITALS — BP 160/75 | HR 64 | Ht 66.0 in | Wt 203.4 lb

## 2023-07-03 DIAGNOSIS — E1169 Type 2 diabetes mellitus with other specified complication: Secondary | ICD-10-CM

## 2023-07-03 DIAGNOSIS — R159 Full incontinence of feces: Secondary | ICD-10-CM

## 2023-07-03 DIAGNOSIS — I10 Essential (primary) hypertension: Secondary | ICD-10-CM | POA: Diagnosis not present

## 2023-07-03 DIAGNOSIS — I5189 Other ill-defined heart diseases: Secondary | ICD-10-CM

## 2023-07-03 DIAGNOSIS — E11319 Type 2 diabetes mellitus with unspecified diabetic retinopathy without macular edema: Secondary | ICD-10-CM | POA: Diagnosis not present

## 2023-07-03 LAB — POCT GLYCOSYLATED HEMOGLOBIN (HGB A1C): HbA1c, POC (controlled diabetic range): 10.5 % — AB (ref 0.0–7.0)

## 2023-07-03 MED ORDER — FLUTICASONE PROPIONATE 50 MCG/ACT NA SUSP: 1.0000 | Freq: Every day | NASAL | 2 refills | Status: AC

## 2023-07-03 MED ORDER — ONETOUCH VERIO VI STRP
ORAL_STRIP | 3 refills | Status: DC
Start: 2023-07-03 — End: 2023-08-28

## 2023-07-03 MED ORDER — ONETOUCH DELICA LANCING DEV MISC: 0 refills | Status: AC

## 2023-07-03 MED ORDER — ONETOUCH DELICA LANCETS 33G MISC: 3 refills | Status: DC

## 2023-07-03 MED ORDER — ONETOUCH VERIO W/DEVICE KIT: PACK | 0 refills | Status: DC

## 2023-07-03 MED ORDER — INSULIN GLARGINE 100 UNITS/ML SOLOSTAR PEN: 10.0000 [IU] | PEN_INJECTOR | Freq: Every day | SUBCUTANEOUS | 1 refills | Status: DC

## 2023-07-03 NOTE — Progress Notes (Unsigned)
  Date of Visit: 07/03/2023   SUBJECTIVE:   HPI:  Gagan presents today for follow-up.  It has been quite a while since I have seen him in the office.  Diabetes: Not on any diabetes medications currently.  A1c today is 10.5.  Previously did not tolerate Ozempic or metformin.  Amenable to starting insulin.  CT results: Request results from CT scan he had done at [facility] urology office.  It showed no visible metastasis of his suspected renal neoplasm.  It did note a filling defect in the wall of his left ventricle with echocardiogram recommended.  He has not heard about these results.  Has follow-up in April with that urology office.  Hypertension: Does not check blood pressure at home.  Is on multiple antihypertensives.  Did not take his medications this morning.  Fecal incontinence: Ongoing for some time.  No blood in the stool.  Due for colonoscopy.   OBJECTIVE:   BP (!) 160/75   Pulse 64   Ht 5\' 6"  (1.676 m)   Wt 203 lb 6.4 oz (92.3 kg)   SpO2 98%   BMI 32.83 kg/m  Gen: No acute distress, pleasant, cooperative, well-appearing HEENT: Normocephalic, atraumatic Lungs: Normal effort on room air Neuro: Grossly nonfocal, speech normal  ASSESSMENT/PLAN:   Assessment & Plan Type 2 diabetes mellitus with other specified complication, without long-term current use of insulin (HCC) Uncontrolled with A1c of 10.5.  Unable to tolerate metformin, GLP-1.  I hesitate to start him on an SGLT2 given his current renal issues.  We are starting Lantus 10 units daily.  Sent in testing supplies and meter.  Follow-up in 1 to 2 weeks to see how he is doing on this regimen. Advised to schedule eye exam Mass of left cardiac ventricle Noted on recent CT.  Echo ordered to further evaluate. Incontinence of feces, unspecified fecal incontinence type Ongoing for several months.  Due for colonoscopy, referring to GI for further evaluation. Essential hypertension Blood pressure elevated but did not take blood  pressure medications today.  Recheck at follow-up visit in a couple of weeks.    FOLLOW UP: Follow up in 1 to 2 weeks for diabetes  [place] J. Pollie Meyer, MD [facility]

## 2023-07-03 NOTE — Patient Instructions (Signed)
 It was great to see you again today.  Begin Lantus 10 units daily Sent in meter and supplies for you to check sugar 3 times a day Referring to GI for your stool issues Get your eyes checked for diabetes issues Refilled Flonase Ordered echocardiogram to look at the left ventricle of your heart  Follow-up with me in 1 to 2 weeks for above issues  Be well, Dr. Pollie Meyer

## 2023-07-05 NOTE — Assessment & Plan Note (Signed)
 Blood pressure elevated but did not take blood pressure medications today.  Recheck at follow-up visit in a couple of weeks.

## 2023-07-05 NOTE — Assessment & Plan Note (Signed)
 Uncontrolled with A1c of 10.5.  Unable to tolerate metformin, GLP-1.  I hesitate to start him on an SGLT2 given his current renal issues.  We are starting Lantus 10 units daily.  Sent in testing supplies and meter.  Follow-up in 1 to 2 weeks to see how he is doing on this regimen. Advised to schedule eye exam

## 2023-07-13 ENCOUNTER — Ambulatory Visit (HOSPITAL_COMMUNITY)
Admission: RE | Admit: 2023-07-13 | Discharge: 2023-07-13 | Disposition: A | Discharge status: Home / Self Care | Source: Ambulatory Visit | Attending: Family Medicine | Admitting: Family Medicine

## 2023-07-13 DIAGNOSIS — I5189 Other ill-defined heart diseases: Secondary | ICD-10-CM | POA: Insufficient documentation

## 2023-07-13 LAB — ECHOCARDIOGRAM COMPLETE
Area-P 1/2: 2.94 cm2
S' Lateral: 2.9 cm

## 2023-07-14 ENCOUNTER — Encounter: Payer: Self-pay | Admitting: Family Medicine

## 2023-07-24 ENCOUNTER — Encounter: Payer: Self-pay | Admitting: Family Medicine

## 2023-07-24 NOTE — Progress Notes (Signed)
 Cardiology Office Note:  .   Date:  08/01/2023  ID:  Jonathan [name], DOB [birth year], MRN [id number] PCP: Latrelle Dodrill, MD  [facility] Providers Cardiologist:  None    History of Present Illness: .   Jonathan [name] is a 72 y.o. male  with a hx of HTN, COPD, tobacco abuse DMII, anxiety, depression, HLD and prior CVA.  Patient was seen for preop clearance due to abnormal eck 2023.ECG showed biphasic T waves in precordial leads and TWI in lead I.NST [name] MI no ischemia  CT [facility] 05[facility]30/23 Small low-attenuation filling defect within the wall of the left ventricle and possibly lipoma abutting the right atrium.. Echocardiogram recommended and low attenuation findings on outside imaging couldn't be well visualized on echo. If concern persists cardiac MRI recommended given history of renal mass.  Patient says he needs his kidney removed but needs better BP control. Denies chest pain, dyspnea, palpitations, edema. Quit smoking 2 yrs ago. Works in his yard, Counselling psychologist. Eats fast food several days a week. Occasional canned soups.    ROS:    Studies Reviewed: Marland Kitchen    EKG Interpretation Date/Time:  Tuesday August 01 2023 11:52:38 EDT Ventricular Rate:  58 PR Interval:  174 QRS Duration:  84 QT Interval:  424 QTC Calculation: 416 R Axis:   71  Text Interpretation: Sinus bradycardia ST & T wave abnormality, consider lateral ischemia When compared with ECG of 05-Oct-2020 09:53, T wave inversion more evident in Lateral leads QT has shortened Confirmed by Jacolyn Reedy [phone]) on 08/01/2023 12:03:06 PM    Prior CV Studies:   Echo 07/13/23 IMPRESSIONS     1. Left ventricular ejection fraction, by estimation, is 60 to 65%. The  left ventricle has normal function. The left ventricle has no regional  wall motion abnormalities. There is mild left ventricular hypertrophy.  Left ventricular diastolic parameters  are consistent with Grade I diastolic dysfunction (impaired  relaxation).   2. Right ventricular systolic function is normal. The right ventricular  size is normal. Tricuspid regurgitation signal is inadequate for assessing  PA pressure.   3. The mitral valve is normal in structure. No evidence of mitral valve  regurgitation. No evidence of mitral stenosis.   4. The aortic valve is grossly normal. Aortic valve regurgitation is not  visualized. No aortic stenosis is present.   5. The inferior vena cava is normal in size with greater than 50%  respiratory variability, suggesting right atrial pressure of 3 mmHg.   Conclusion(s)/Recommendation(s): The low attenuation findings on outside  CT (images cannot be independently reviewed) are not well visualized on  echocardiography. If clinical concern persists consider cardiology  consultation and cardiac MRI given history   of renal mass. On review of prior cross sectional imaging from 2022,  these findings are likely present, and most likely represent epicardial  and intramyocardial fat.   CT chest 05/2023 Impression  1.  Redemonstrated enhancing mass centrally within the right kidney extending into the renal pelvis/fat concerning for neoplasm. No CT findings to suggest metastatic disease in the chest, abdomen and pelvis.  2.  Small low-attenuation filling defect within the wall of the left ventricle and possibly lipoma abutting the right atrium.. Echocardiogram recommended  3.  Ancillary findings as above.  NST 09/2021  Findings are consistent with prior myocardial infarction. The study is intermediate risk.   No ST deviation was noted.   LV perfusion is abnormal. There is no evidence of ischemia. There is  evidence of infarction. Defect 1: There is a small defect with mild reduction in uptake present in the apical anterior and apex location(s) that is fixed. There is abnormal wall motion in the defect area. Consistent with infarction.   Left ventricular function is abnormal. Global function is mildly  reduced. Nuclear stress EF: 45 %. The left ventricular ejection fraction is mildly decreased (45-54%). End diastolic cavity size is normal. End systolic cavity size is normal.   Prior study not available for comparison.   Fixed apical perfusion defect with hypokinesis consistent with prior infarct Mild systolic dysfunction (EF 45%) Intermediate risk study due to systolic dysfunction.  No ischemia.   Echo 2023 IMPRESSIONS     1. Left ventricular ejection fraction, by estimation, is 60 to 65%. The  left ventricle has normal function. The left ventricle has no regional  wall motion abnormalities. There is moderate concentric left ventricular  hypertrophy. Left ventricular  diastolic parameters are consistent with Grade I diastolic dysfunction  (impaired relaxation). Elevated left ventricular end-diastolic pressure.   2. Right ventricular systolic function is normal. The right ventricular  size is normal. Tricuspid regurgitation signal is inadequate for assessing  PA pressure.   3. The mitral valve is normal in structure. Trivial mitral valve  regurgitation. No evidence of mitral stenosis.   4. The aortic valve is tricuspid. Aortic valve regurgitation is not  visualized. Aortic valve sclerosis/calcification is present, without any  evidence of aortic stenosis.   5. The inferior vena cava is normal in size with greater than 50%  respiratory variability, suggesting right atrial pressure of 3 mmHg.    Risk Assessment/Calculations:     HYPERTENSION CONTROL Vitals:   08/01/23 1149 08/01/23 1219  BP: (!) 152/69 (!) 150/70    The patient's blood pressure is elevated above target today.  In order to address the patient's elevated BP: Blood pressure will be monitored at home to determine if medication changes need to be made.; A current anti-hypertensive medication was adjusted today.; Follow up with general cardiology has been recommended.          Physical Exam:   VS:  BP (!) 150/70    Pulse (!) 57   Resp 16   Ht 5\' 6"  (1.676 m)   Wt 203 lb 9.6 oz (92.4 kg)   SpO2 95%   BMI 32.86 kg/m    Wt Readings from Last 3 Encounters:  08/01/23 203 lb 9.6 oz (92.4 kg)  07/25/23 200 lb 3.2 oz (90.8 kg)  07/03/23 203 lb 6.4 oz (92.3 kg)    GEN: Well nourished, well developed in no acute distress NECK: No JVD; bilateral carotid bruits CARDIAC:  RRR, no murmurs, rubs, gallops RESPIRATORY:  Clear to auscultation without rales, wheezing or rhonchi  ABDOMEN: Soft, non-tender, non-distended EXTREMITIES:  No edema; No deformity   ASSESSMENT AND PLAN: .    Abnormal EKG-no symptoms, no ischemia on NST 2023(EF45%) but EF normal on echo.  HTN -BP has been uncontrolled. Increase hydralazine 25 mg tid, 2 gm sodium diet, encourage CPAP use  ? Right atrial lipoma-CT at [facility] 05/30/23 Small low-attenuation filling defect within the wall of the left ventricle and possibly lipoma abutting the right atrium.. Echocardiogram recommended and low attenuation findings on outside imaging couldn't be well visualized on echo. If concern persists cardiac MRI recommended given history of renal mass. Patient has no symptoms but needs kidney removed for renal CA. Will proceed with cardiac MRI to clarify.   HLD on lipitor managed by  PCP-no recent lipid panel  History of CVA on Plavix and ASA  DM2-A1C over 10. Says sugars running 200's  CKD now with renal CA and needs nephrectomy  Tobacco abuse-quit smoking 2 yrs ago  OSA on CPAP but not using for several years.   Bilateral carotid bruits-check carotid dopplers     Informed Consent   Shared Decision Making/Informed Consent{      Dispo: f/u after cardiac MRI  Signed, Jacolyn Reedy, PA-C

## 2023-07-25 ENCOUNTER — Ambulatory Visit: Admitting: Family Medicine

## 2023-07-25 ENCOUNTER — Encounter: Payer: Self-pay | Admitting: Family Medicine

## 2023-07-25 VITALS — BP 122/72 | HR 60 | Ht 66.0 in | Wt 200.2 lb

## 2023-07-25 DIAGNOSIS — I10 Essential (primary) hypertension: Secondary | ICD-10-CM

## 2023-07-25 DIAGNOSIS — J309 Allergic rhinitis, unspecified: Secondary | ICD-10-CM | POA: Diagnosis not present

## 2023-07-25 DIAGNOSIS — E1169 Type 2 diabetes mellitus with other specified complication: Secondary | ICD-10-CM

## 2023-07-25 DIAGNOSIS — Z794 Long term (current) use of insulin: Secondary | ICD-10-CM

## 2023-07-25 NOTE — Patient Instructions (Addendum)
 It was great to see you again today.  Take zyrtec 10mg  daily  Go up by 2 units of insulin each day that your sugar is over 120 in the morning.  [facility]: [phone]   Stay on current blood pressure medications   Follow up with me in 3 weeks for your blood sugars  Be well, Dr. Pollie Meyer

## 2023-07-25 NOTE — Progress Notes (Unsigned)
  Date of Visit: 07/25/2023   SUBJECTIVE:   HPI:  Jonathan [name] presents today for routine follow up.  GI referral - hasn't heard about appointment, willing to call  Echo findings - has appointment 4/8 with cardiology for evaluation  Allergies - coughing, sneezing, clear nasal congestion. Eating and drinking well. Has not tried any medications other than tylenol. Unclear if had fever, thinks he may have a few days ago but does not have thermometer.   Diabetes - sugars running in 200s, no lows. Currently taking lantus 12u daily.   OBJECTIVE:   BP 122/72   Pulse 60   Ht 5\' 6"  (1.676 m)   Wt 200 lb 3.2 oz (90.8 kg)   SpO2 99%   BMI 32.31 kg/m  Gen: no acute distress, pleasant, cooperativ ewell appearing HEENT: ncat Heart: regular rate and rhythm, no murmur Lungs: clear to auscultation bilaterally, normal work of breathing    ASSESSMENT/PLAN:   Assessment & Plan Type 2 diabetes mellitus with other specified complication, without long-term current use of insulin (HCC) Reviewed self titration of insulin upwards until fasting sugars are <120 He will do this Essential hypertension Well controlled, continue medications Consider consolidating pills in future Allergic rhinitis, unspecified seasonality, unspecified trigger Uncontrolled, add zyrtec   Provided # for [facility]  Jonathan J. Pollie Meyer, MD [facility]

## 2023-07-27 NOTE — Assessment & Plan Note (Signed)
 Well controlled, continue medications Consider consolidating pills in future

## 2023-07-27 NOTE — Assessment & Plan Note (Signed)
 Uncontrolled, add zyrtec

## 2023-07-27 NOTE — Assessment & Plan Note (Signed)
 Reviewed self titration of insulin upwards until fasting sugars are <120 He will do this

## 2023-07-28 ENCOUNTER — Encounter: Payer: Self-pay | Admitting: Family Medicine

## 2023-07-31 ENCOUNTER — Ambulatory Visit (INDEPENDENT_AMBULATORY_CARE_PROVIDER_SITE_OTHER): Admitting: Podiatry

## 2023-07-31 ENCOUNTER — Encounter: Payer: Self-pay | Admitting: Podiatry

## 2023-07-31 DIAGNOSIS — B351 Tinea unguium: Secondary | ICD-10-CM | POA: Diagnosis not present

## 2023-07-31 DIAGNOSIS — E119 Type 2 diabetes mellitus without complications: Secondary | ICD-10-CM

## 2023-07-31 DIAGNOSIS — M79674 Pain in right toe(s): Secondary | ICD-10-CM | POA: Diagnosis not present

## 2023-07-31 DIAGNOSIS — M79675 Pain in left toe(s): Secondary | ICD-10-CM | POA: Diagnosis not present

## 2023-07-31 MED ORDER — CETIRIZINE HCL 10 MG PO TABS
10.0000 mg | ORAL_TABLET | Freq: Every day | ORAL | 0 refills | Status: DC
Start: 2023-07-31 — End: 2023-08-02

## 2023-07-31 NOTE — Progress Notes (Signed)
   Chief Complaint  Patient presents with   Diabetes    RM8: diabetic with bil nail fungus    SUBJECTIVE Patient with a history of diabetes mellitus presents to office today complaining of elongated, thickened nails that cause pain while ambulating in shoes.  Patient is unable to trim their own nails. Patient is here for further evaluation and treatment.  Past Medical History:  Diagnosis Date   Anxiety    Arthritis    COPD (chronic obstructive pulmonary disease) (HCC)    Depression    Diabetes mellitus without complication (HCC)    Diarrhea    History of kidney stones    HTN (hypertension)    Hyperlipidemia    Hypertensive retinopathy    OU   Pneumonia    Retinal detachment    Rheg. RD OD   Sleep apnea    does not use cpap   Stroke [facility])    denies any deficits    No Known Allergies   OBJECTIVE General Patient is awake, alert, and oriented x 3 and in no acute distress. Derm Skin is dry and supple bilateral. Negative open lesions or macerations. Remaining integument unremarkable. Nails are tender, long, thickened and dystrophic with subungual debris, consistent with onychomycosis, 1-5 bilateral. No signs of infection noted. Vasc  DP and PT pedal pulses palpable bilaterally. Temperature gradient within normal limits.  Neuro Epicritic and protective threshold sensation diminished bilaterally.  Musculoskeletal Exam No symptomatic pedal deformities noted bilateral. Muscular strength within normal limits.  ASSESSMENT 1. Diabetes Mellitus w/ peripheral neuropathy 2.  Pain due to onychomycosis of toenails bilateral  PLAN OF CARE 1. Patient evaluated today.  Comprehensive diabetic foot exam performed today 2. Instructed to maintain good pedal hygiene and foot care. Stressed importance of controlling blood sugar.  3. Mechanical debridement of nails 1-5 bilaterally performed using a nail nipper. Filed with dremel without incident.  4. Return to clinic in 3 mos.     Felecia Shelling, DPM [facility]  Dr. Felecia Shelling, DPM    [address] [place], [place] [address]                Office [phone]  Fax [phone]

## 2023-07-31 NOTE — Patient Instructions (Signed)

## 2023-08-01 ENCOUNTER — Encounter: Payer: Self-pay | Admitting: Family Medicine

## 2023-08-01 ENCOUNTER — Ambulatory Visit: Attending: Physician Assistant | Admitting: Physician Assistant

## 2023-08-01 ENCOUNTER — Encounter: Payer: Self-pay | Admitting: Physician Assistant

## 2023-08-01 VITALS — BP 150/70 | HR 57 | Resp 16 | Ht 66.0 in | Wt 203.6 lb

## 2023-08-01 DIAGNOSIS — R7989 Other specified abnormal findings of blood chemistry: Secondary | ICD-10-CM

## 2023-08-01 DIAGNOSIS — R0989 Other specified symptoms and signs involving the circulatory and respiratory systems: Secondary | ICD-10-CM

## 2023-08-01 DIAGNOSIS — I10 Essential (primary) hypertension: Secondary | ICD-10-CM

## 2023-08-01 DIAGNOSIS — J449 Chronic obstructive pulmonary disease, unspecified: Secondary | ICD-10-CM | POA: Diagnosis not present

## 2023-08-01 DIAGNOSIS — G4733 Obstructive sleep apnea (adult) (pediatric): Secondary | ICD-10-CM | POA: Diagnosis not present

## 2023-08-01 DIAGNOSIS — F17200 Nicotine dependence, unspecified, uncomplicated: Secondary | ICD-10-CM

## 2023-08-01 DIAGNOSIS — E1169 Type 2 diabetes mellitus with other specified complication: Secondary | ICD-10-CM

## 2023-08-01 DIAGNOSIS — R9431 Abnormal electrocardiogram [ECG] [EKG]: Secondary | ICD-10-CM

## 2023-08-01 DIAGNOSIS — Z720 Tobacco use: Secondary | ICD-10-CM | POA: Diagnosis not present

## 2023-08-01 DIAGNOSIS — Z8673 Personal history of transient ischemic attack (TIA), and cerebral infarction without residual deficits: Secondary | ICD-10-CM

## 2023-08-01 DIAGNOSIS — I5189 Other ill-defined heart diseases: Secondary | ICD-10-CM

## 2023-08-01 DIAGNOSIS — E785 Hyperlipidemia, unspecified: Secondary | ICD-10-CM | POA: Diagnosis not present

## 2023-08-01 DIAGNOSIS — E119 Type 2 diabetes mellitus without complications: Secondary | ICD-10-CM

## 2023-08-01 DIAGNOSIS — Z01810 Encounter for preprocedural cardiovascular examination: Secondary | ICD-10-CM

## 2023-08-01 DIAGNOSIS — Z79899 Other long term (current) drug therapy: Secondary | ICD-10-CM

## 2023-08-01 NOTE — Patient Instructions (Addendum)
 Medication Instructions:  Your physician recommends that you continue on your current medications as directed. Please refer to the Current Medication list given to you today.   PLEASE MAKE SURE TO TAKE YOUR HYDRALAZINE 25 MG THREE TIMES A DAY.  *If you need a refill on your cardiac medications before your next appointment, please call your pharmacy*  Lab Work: NONE If you have labs (blood work) drawn today and your tests are completely normal, you will receive your results only by: MyChart Message (if you have MyChart) OR A paper copy in the mail If you have any lab test that is abnormal or we need to change your treatment, we will call you to review the results.  Testing/Procedures: Your physician has requested that you have a cardiac MRI. Cardiac MRI uses a computer to create images of your heart as its beating, producing both still and moving pictures of your heart and major blood vessels. For further information please visit [email or web address]. Please follow the instruction sheet given to you today for more information.   Your physician has requested that you have a carotid duplex. This test is an ultrasound of the carotid arteries in your neck. It looks at blood flow through these arteries that supply the brain with blood. Allow one hour for this exam. There are no restrictions or special instructions.   Follow-Up: At [facility], you and your health needs are our priority.  As part of our continuing mission to provide you with exceptional heart care, our providers are all part of one team.  This team includes your primary Cardiologist (physician) and Advanced Practice Providers or APPs (Physician Assistants and Nurse Practitioners) who all work together to provide you with the care you need, when you need it.  Your next appointment:   AFTER CARDIAC MRI  Provider:   FIRST AVAILABLE   We recommend signing up for the patient portal called "MyChart".  Sign up information is  provided on this After Visit Summary.  MyChart is used to connect with patients for Virtual Visits (Telemedicine).  Patients are able to view lab/test results, encounter notes, upcoming appointments, etc.  Non-urgent messages can be sent to your provider as well.   To learn more about what you can do with MyChart, go to [email or web address].   Other Instructions Two Gram Sodium Diet 2000 mg  What is Sodium? Sodium is a mineral found naturally in many foods. The most significant source of sodium in the diet is table salt, which is about 40% sodium.  Processed, convenience, and preserved foods also contain a large amount of sodium.  The body needs only 500 mg of sodium daily to function,  A normal diet provides more than enough sodium even if you do not use salt.  Why Limit Sodium? A build up of sodium in the body can cause thirst, increased blood pressure, shortness of breath, and water retention.  Decreasing sodium in the diet can reduce edema and risk of heart attack or stroke associated with high blood pressure.  Keep in mind that there are many other factors involved in these health problems.  Heredity, obesity, lack of exercise, cigarette smoking, stress and what you eat all play a role.  General Guidelines: Do not add salt at the table or in cooking.  One teaspoon of salt contains over 2 grams of sodium. Read food labels Avoid processed and convenience foods Ask your dietitian before eating any foods not dicussed in the menu planning guidelines Consult your physician if you  wish to use a salt substitute or a sodium containing medication such as antacids.  Limit milk and milk products to 16 oz (2 cups) per day.  Shopping Hints: READ LABELS!! "Dietetic" does not necessarily mean low sodium. Salt and other sodium ingredients are often added to foods during processing.    Menu Planning Guidelines Food Group Choose More Often Avoid  Beverages (see also the milk group All fruit juices,  low-sodium, salt-free vegetables juices, low-sodium carbonated beverages Regular vegetable or tomato juices, commercially softened water used for drinking or cooking  Breads and Cereals Enriched white, wheat, rye and pumpernickel bread, hard rolls and dinner rolls; muffins, cornbread and waffles; most dry cereals, cooked cereal without added salt; unsalted crackers and breadsticks; low sodium or homemade bread crumbs Bread, rolls and crackers with salted tops; quick breads; instant hot cereals; pancakes; commercial bread stuffing; self-rising flower and biscuit mixes; regular bread crumbs or cracker crumbs  Desserts and Sweets Desserts and sweets mad with mild should be within allowance Instant pudding mixes and cake mixes  Fats Butter or margarine; vegetable oils; unsalted salad dressings, regular salad dressings limited to 1 Tbs; light, sour and heavy cream Regular salad dressings containing bacon fat, bacon bits, and salt pork; snack dips made with instant soup mixes or processed cheese; salted nuts  Fruits Most fresh, frozen and canned fruits Fruits processed with salt or sodium-containing ingredient (some dried fruits are processed with sodium sulfites        Vegetables Fresh, frozen vegetables and low- sodium canned vegetables Regular canned vegetables, sauerkraut, pickled vegetables, and others prepared in brine; frozen vegetables in sauces; vegetables seasoned with ham, bacon or salt pork  Condiments, Sauces, Miscellaneous  Salt substitute with physician's approval; pepper, herbs, spices; vinegar, lemon or lime juice; hot pepper sauce; garlic powder, onion powder, low sodium soy sauce (1 Tbs.); low sodium condiments (ketchup, chili sauce, mustard) in limited amounts (1 tsp.) fresh ground horseradish; unsalted tortilla chips, pretzels, potato chips, popcorn, salsa (1/4 cup) Any seasoning made with salt including garlic salt, celery salt, onion salt, and seasoned salt; sea salt, rock salt, kosher  salt; meat tenderizers; monosodium glutamate; mustard, regular soy sauce, barbecue, sauce, chili sauce, teriyaki sauce, steak sauce, Worcestershire sauce, and most flavored vinegars; canned gravy and mixes; regular condiments; salted snack foods, olives, picles, relish, horseradish sauce, catsup   Food preparation: Try these seasonings Meats:    Pork Sage, onion Serve with applesauce  Chicken Poultry seasoning, thyme, parsley Serve with cranberry sauce  Lamb Curry powder, rosemary, garlic, thyme Serve with mint sauce or jelly  Veal Marjoram, basil Serve with current jelly, cranberry sauce  Beef Pepper, bay leaf Serve with dry mustard, unsalted chive butter  Fish [place] leaf, dill Serve with unsalted lemon butter, unsalted parsley butter  Vegetables:    Asparagus Lemon juice   Broccoli Lemon juice   Carrots Mustard dressing parsley, mint, nutmeg, glazed with unsalted butter and sugar   Green beans Marjoram, lemon juice, nutmeg,dill seed   Tomatoes Basil, marjoram, onion   Spice /blend for [employer]" 4 tsp ground thyme 1 tsp ground sage 3 tsp ground rosemary 4 tsp ground marjoram   Test your knowledge A product that says "Salt Free" may still contain sodium. True or False Garlic Powder and Hot Pepper Sauce an be used as alternative seasonings.True or False Processed foods have more sodium than fresh foods.  True or False Canned Vegetables have less sodium than froze True or False   WAYS TO DECREASE  YOUR SODIUM INTAKE Avoid the use of added salt in cooking and at the table.  Table salt (and other prepared seasonings which contain salt) is probably one of the greatest sources of sodium in the diet.  Unsalted foods can gain flavor from the sweet, sour, and butter taste sensations of herbs and spices.  Instead of using salt for seasoning, try the following seasonings with the foods listed.  Remember: how you use them to enhance natural food flavors is limited only by your  creativity... Allspice-Meat, fish, eggs, fruit, peas, red and yellow vegetables Almond Extract-Fruit baked goods Anise Seed-Sweet breads, fruit, carrots, beets, cottage cheese, cookies (tastes like licorice) Basil-Meat, fish, eggs, vegetables, rice, vegetables salads, soups, sauces Bay Leaf-Meat, fish, stews, poultry Burnet-Salad, vegetables (cucumber-like flavor) Caraway Seed-Bread, cookies, cottage cheese, meat, vegetables, cheese, rice Cardamon-Baked goods, fruit, soups Celery Powder or seed-Salads, salad dressings, sauces, meatloaf, soup, bread.Do not use  celery salt Chervil-Meats, salads, fish, eggs, vegetables, cottage cheese (parsley-like flavor) Chili Power-Meatloaf, chicken cheese, corn, eggplant, egg dishes Chives-Salads cottage cheese, egg dishes, soups, vegetables, sauces Cilantro-Salsa, casseroles Cinnamon-Baked goods, fruit, pork, lamb, chicken, carrots Cloves-Fruit, baked goods, fish, pot roast, green beans, beets, carrots Coriander-Pastry, cookies, meat, salads, cheese (lemon-orange flavor) Cumin-Meatloaf, fish,cheese, eggs, cabbage,fruit pie (caraway flavor) [employer], fruit, eggs, fish, poultry, cottage cheese, vegetables Dill Seed-Meat, cottage cheese, poultry, vegetables, fish, salads, bread Fennel Seed-Bread, cookies, apples, pork, eggs, fish, beets, cabbage, cheese, Licorice-like flavor Garlic-(buds or powder) Salads, meat, poultry, fish, bread, butter, vegetables, potatoes.Do not  use garlic salt Ginger-Fruit, vegetables, baked goods, meat, fish, poultry Horseradish Root-Meet, vegetables, butter Lemon Juice or Extract-Vegetables, fruit, tea, baked goods, fish salads Mace-Baked goods fruit, vegetables, fish, poultry (taste like nutmeg) Maple Extract-Syrups Marjoram-Meat, chicken, fish, vegetables, breads, green salads (taste like Sage) Mint-Tea, lamb, sherbet, vegetables, desserts, carrots, cabbage Mustard, Dry or Seed-Cheese, eggs, meats, vegetables,  poultry Nutmeg-Baked goods, fruit, chicken, eggs, vegetables, desserts Onion Powder-Meat, fish, poultry, vegetables, cheese, eggs, bread, rice salads (Do not use   Onion salt) Orange Extract-Desserts, baked goods Oregano-Pasta, eggs, cheese, onions, pork, lamb, fish, chicken, vegetables, green salads Paprika-Meat, fish, poultry, eggs, cheese, vegetables Parsley Flakes-Butter, vegetables, meat fish, poultry, eggs, bread, salads (certain forms may   Contain sodium Pepper-Meat fish, poultry, vegetables, eggs Peppermint Extract-Desserts, baked goods Poppy Seed-Eggs, bread, cheese, fruit dressings, baked goods, noodles, vegetables, cottage  [employer], poultry, meat, fish, cauliflower, turnips,eggs bread Saffron-Rice, bread, veal, chicken, fish, eggs Sage-Meat, fish, poultry, onions, eggplant, tomateos, pork, stews Savory-Eggs, salads, poultry, meat, rice, vegetables, soups, pork Tarragon-Meat, poultry, fish, eggs, butter, vegetables (licorice-like flavor)  Thyme-Meat, poultry, fish, eggs, vegetables, (clover-like flavor), sauces, soups Tumeric-Salads, butter, eggs, fish, rice, vegetables (saffron-like flavor) Vanilla Extract-Baked goods, candy Vinegar-Salads, vegetables, meat marinades Walnut Extract-baked goods, candy   2. Choose your Foods Wisely   The following is a list of foods to avoid which are high in sodium:  Meats-Avoid all smoked, canned, salt cured, dried and kosher meat and fish as well as Anchovies   [employer] meats:Bologna, Liverwurst, Pastrami Canned meat or fish  Marinated herring Caviar    Pepperoni Corned Beef   Pizza Dried chipped beef  Salami Frozen breaded fish or meat Salt pork Frankfurters or hot dogs  Sardines Gefilte fish   Sausage Ham (boiled ham, Proscuitto Smoked butt    spiced ham)   Spam      TV Dinners Vegetables Canned vegetables (Regular) Relish Canned mushrooms  Sauerkraut Olives    Tomato  juice Pickles  Bakery and Dessert Products Canned puddings  Cream pies Cheesecake   Decorated cakes Cookies  Beverages/Juices Tomato juice, regular  Gatorade   V-8 vegetable juice, regular  Breads and Cereals Biscuit mixes   Salted potato chips, corn chips, pretzels Bread stuffing mixes  Salted crackers and rolls Pancake and waffle mixes Self-rising flour  Seasonings Accent    Meat sauces Barbecue sauce  Meat tenderizer Catsup    Monosodium glutamate (MSG) Celery salt   Onion salt Chili sauce   Prepared mustard Garlic salt   Salt, seasoned salt, sea salt Gravy mixes   Soy sauce Horseradish   Steak sauce Ketchup   Tartar sauce Lite salt    Teriyaki sauce Marinade mixes   Worcestershire sauce  Others Baking powder   Cocoa and cocoa mixes Baking soda   Commercial casserole mixes Candy-caramels, chocolate  Dehydrated soups    Bars, fudge,nougats  Instant rice and pasta mixes Canned broth or soup  Maraschino cherries Cheese, aged and processed cheese and cheese spreads  Learning Assessment Quiz  Indicated T (for True) or F (for False) for each of the following statements:  _____ Fresh fruits and vegetables and unprocessed grains are generally low in sodium _____ Water may contain a considerable amount of sodium, depending on the source _____ You can always tell if a food is high in sodium by tasting it _____ Certain laxatives my be high in sodium and should be avoided unless prescribed   by a physician or pharmacist _____ Salt substitutes may be used freely by anyone on a sodium restricted diet _____ Sodium is present in table salt, food additives and as a natural component of   most foods _____ Table salt is approximately 90% sodium _____ Limiting sodium intake may help prevent excess fluid accumulation in the body _____ On a sodium-restricted diet, seasonings such as bouillon soy sauce, and    cooking wine should be used in place of table salt _____ On an ingredient  list, a product which lists monosodium glutamate as the first   ingredient is an appropriate food to include on a low sodium diet  Circle the best answer(s) to the following statements (Hint: there may be more than one correct answer)  11. On a low-sodium diet, some acceptable snack items are:    A. Olives  F. Bean dip   K. Grapefruit juice    B. Salted Pretzels G. Commercial Popcorn   L. Canned peaches    C. Carrot Sticks  H. Bouillon   M. Unsalted nuts   D. [place] fries  I. Peanut butter crackers N. Salami   E. Sweet pickles J. Tomato Juice   O. Pizza  12.  Seasonings that may be used freely on a reduced - sodium diet include   A. Lemon wedges F.Monosodium glutamate K. Celery seed    B.Soysauce   G. Pepper   L. Mustard powder   C. Sea salt  H. Cooking wine  M. Onion flakes   D. Vinegar  E. Prepared horseradish N. Salsa   E. Sage   J. Worcestershire sauce  O. Chutney       1st Floor: - Lobby - Registration  - Pharmacy  - Lab - Cafe  2nd Floor: - PV Lab - Diagnostic Testing (echo, CT, nuclear med)  3rd Floor: - Vacant  4th Floor: - TCTS (cardiothoracic surgery) - [facility] Clinic - Structural Heart Clinic - Vascular Surgery  - Vascular Ultrasound  5th Floor: - [facility] (  general and EP) - Clinical Pharmacy for coumadin, hypertension, lipid, weight-loss medications, and med management appointments    Valet parking services will be available as well.

## 2023-08-02 ENCOUNTER — Other Ambulatory Visit: Payer: Self-pay | Admitting: Family Medicine

## 2023-08-02 ENCOUNTER — Other Ambulatory Visit (HOSPITAL_COMMUNITY): Payer: Self-pay

## 2023-08-02 ENCOUNTER — Other Ambulatory Visit: Payer: Self-pay

## 2023-08-02 MED ORDER — CLOPIDOGREL BISULFATE 75 MG PO TABS
75.0000 mg | ORAL_TABLET | Freq: Every day | ORAL | 1 refills | Status: DC
  Filled 2023-08-02 – 2023-08-23 (×2): qty 90, 90d supply, fill #0

## 2023-08-02 MED ORDER — HYDRALAZINE HCL 25 MG PO TABS
25.0000 mg | ORAL_TABLET | Freq: Three times a day (TID) | ORAL | 1 refills | Status: DC
Start: 2023-08-02 — End: 2023-10-31
  Filled 2023-08-02 – 2023-08-23 (×2): qty 270, 90d supply, fill #0

## 2023-08-02 MED ORDER — LISINOPRIL 40 MG PO TABS
40.0000 mg | ORAL_TABLET | Freq: Every day | ORAL | 1 refills | Status: DC
  Filled 2023-08-02 – 2023-08-26 (×4): qty 90, 90d supply, fill #0
  Filled 2023-10-12 – 2023-11-25 (×3): qty 90, 90d supply, fill #1

## 2023-08-02 MED ORDER — BUPROPION HCL ER (XL) 300 MG PO TB24
300.0000 mg | ORAL_TABLET | Freq: Every morning | ORAL | 1 refills | Status: AC
  Filled 2023-08-02 – 2023-08-23 (×2): qty 90, 90d supply, fill #0
  Filled 2023-11-20: qty 90, 90d supply, fill #1

## 2023-08-02 MED ORDER — SPIRONOLACTONE 50 MG PO TABS
50.0000 mg | ORAL_TABLET | Freq: Every day | ORAL | 1 refills | Status: DC
  Filled 2023-08-02 – 2023-08-23 (×2): qty 90, 90d supply, fill #0

## 2023-08-02 MED ORDER — HYDROCHLOROTHIAZIDE 25 MG PO TABS
25.0000 mg | ORAL_TABLET | Freq: Every day | ORAL | 1 refills | Status: DC
  Filled 2023-08-02 – 2023-08-23 (×2): qty 90, 90d supply, fill #0

## 2023-08-03 ENCOUNTER — Other Ambulatory Visit: Payer: Self-pay

## 2023-08-03 ENCOUNTER — Other Ambulatory Visit (HOSPITAL_COMMUNITY): Payer: Self-pay

## 2023-08-03 MED ORDER — AMLODIPINE BESYLATE 10 MG PO TABS
10.0000 mg | ORAL_TABLET | Freq: Every day | ORAL | 1 refills | Status: DC
  Filled 2023-08-03 – 2023-08-26 (×3): qty 90, 90d supply, fill #0
  Filled 2023-09-21: qty 90, 90d supply, fill #1

## 2023-08-03 MED ORDER — CETIRIZINE HCL 10 MG PO TABS
10.0000 mg | ORAL_TABLET | Freq: Every day | ORAL | 1 refills | Status: AC
  Filled 2023-08-03 – 2023-11-20 (×7): qty 90, 90d supply, fill #0

## 2023-08-12 ENCOUNTER — Other Ambulatory Visit (HOSPITAL_COMMUNITY): Payer: Self-pay

## 2023-08-17 ENCOUNTER — Other Ambulatory Visit: Payer: Self-pay

## 2023-08-18 ENCOUNTER — Other Ambulatory Visit: Payer: Self-pay

## 2023-08-23 ENCOUNTER — Other Ambulatory Visit (HOSPITAL_COMMUNITY): Payer: Self-pay

## 2023-08-24 ENCOUNTER — Encounter (HOSPITAL_COMMUNITY): Payer: Self-pay

## 2023-08-24 ENCOUNTER — Ambulatory Visit (HOSPITAL_COMMUNITY): Admission: RE | Admit: 2023-08-24 | Source: Ambulatory Visit

## 2023-08-26 ENCOUNTER — Other Ambulatory Visit (HOSPITAL_COMMUNITY): Payer: Self-pay

## 2023-08-26 ENCOUNTER — Other Ambulatory Visit (HOSPITAL_BASED_OUTPATIENT_CLINIC_OR_DEPARTMENT_OTHER): Payer: Self-pay

## 2023-08-28 ENCOUNTER — Encounter: Payer: Self-pay | Admitting: Family Medicine

## 2023-08-28 ENCOUNTER — Other Ambulatory Visit (HOSPITAL_COMMUNITY): Payer: Self-pay

## 2023-08-28 ENCOUNTER — Ambulatory Visit (INDEPENDENT_AMBULATORY_CARE_PROVIDER_SITE_OTHER): Admitting: Family Medicine

## 2023-08-28 VITALS — BP 139/81 | HR 82 | Ht 66.0 in | Wt 202.6 lb

## 2023-08-28 DIAGNOSIS — N2889 Other specified disorders of kidney and ureter: Secondary | ICD-10-CM

## 2023-08-28 DIAGNOSIS — J309 Allergic rhinitis, unspecified: Secondary | ICD-10-CM

## 2023-08-28 DIAGNOSIS — Z0000 Encounter for general adult medical examination without abnormal findings: Secondary | ICD-10-CM

## 2023-08-28 DIAGNOSIS — Z1211 Encounter for screening for malignant neoplasm of colon: Secondary | ICD-10-CM | POA: Diagnosis not present

## 2023-08-28 DIAGNOSIS — Z23 Encounter for immunization: Secondary | ICD-10-CM

## 2023-08-28 DIAGNOSIS — E1169 Type 2 diabetes mellitus with other specified complication: Secondary | ICD-10-CM | POA: Diagnosis not present

## 2023-08-28 MED ORDER — TETANUS-DIPHTH-ACELL PERTUSSIS 5-2.5-18.5 LF-MCG/0.5 IM SUSP: 0.5000 mL | Freq: Once | INTRAMUSCULAR | 0 refills | Status: AC

## 2023-08-28 MED ORDER — ONETOUCH VERIO VI STRP
ORAL_STRIP | 3 refills | Status: DC
  Filled 2023-08-28: qty 100, 30d supply, fill #0
  Filled 2023-08-28 – 2023-09-21 (×3): qty 100, 33d supply, fill #0
  Filled 2023-09-25 – 2023-11-20 (×4): qty 100, 33d supply, fill #1
  Filled 2023-12-28 – 2024-01-01 (×2): qty 100, 33d supply, fill #2

## 2023-08-28 MED ORDER — IPRATROPIUM BROMIDE 0.03 % NA SOLN
2.0000 | Freq: Two times a day (BID) | NASAL | 3 refills | Status: AC
  Filled 2023-08-28 – 2023-09-21 (×2): qty 30, 45d supply, fill #0
  Filled 2023-11-20: qty 30, 45d supply, fill #1
  Filled 2024-02-02: qty 30, 45d supply, fill #2

## 2023-08-28 NOTE — Patient Instructions (Signed)
 It was great to see you again today.  Work on eye exam, shingrix, and cologuard (ordered) Take Tdap vaccine rx to your pharmacy to get it  Call GI office [facility]: [phone]   COVID booster today  Keep going up on lantus  as we discussed  Follow up with me in 1 month, sooner if needed.  Be well, Dr. Dawn Eth

## 2023-08-28 NOTE — Progress Notes (Unsigned)
  Date of Visit: 08/28/2023   SUBJECTIVE:   HPI:  Jonathan [name] presents today for routine follow-up.  Diabetes: Currently taking Lantus  18 units daily.  Fasting sugars have been ranging in the 190s to 200s.  He has not been titrating up as quickly as we discussed last time.  Hypertension: Currently taking amlodipine  10 mg daily, hydralazine  25 mg 3 times daily, HCTZ 25 mg daily, lisinopril  40 mg daily, spironolactone  50 mg daily.  Tolerating all of these medications well.  Did not take all of his blood pressure medicines yet today.  Allergies: Currently taking Zyrtec  10 mg daily and Flonase  daily without relief.  Interested in adding an additional agent.  Has not called GI office for appointment Also needs to follow-up with urologist at [facility]   OBJECTIVE:   BP 139/81   Pulse 82   Ht 5\' 6"  (1.676 m)   Wt 202 lb 9.6 oz (91.9 kg)   SpO2 97%   BMI 32.70 kg/m  Gen: No acute distress, pleasant, cooperative, well-appearing HEENT: Normocephalic, atraumatic, nares without drainage Heart: Regular rate and rhythm, no murmur Lungs: Clear bilaterally, normal effort Neuro: Grossly nonfocal, speech normal  ASSESSMENT/PLAN:   Assessment & Plan Type 2 diabetes mellitus with other specified complication, without long-term current use of insulin  (HCC) Remains uncontrolled by fasting sugars.  Encouraged continual titration of insulin  as we previously discussed.  He will work on this. Allergic rhinitis, unspecified seasonality, unspecified trigger Uncontrolled, continue Zyrtec  and Flonase , add ipratropium nasal spray Routine adult health maintenance Discussed colon cancer screening options, he elects for Cologuard, ordered Given prescription to get Tdap at his pharmacy Encouraged to get Shingrix at his pharmacy Advised to schedule eye exam COVID booster given today Had recent CT chest at [facility], will abstract in to count for his annual low-dose CT for lung cancer screening Kidney  mass Encouraged him to call and schedule a follow-up appointment with urology at [facility].  Advised his blood pressure is no longer a barrier to him having surgery, this was the case several years ago.    FOLLOW UP: Follow up in 1 month for above issues  [place] J. Dawn Eth, MD [facility]

## 2023-08-29 ENCOUNTER — Other Ambulatory Visit (HOSPITAL_COMMUNITY): Payer: Self-pay

## 2023-08-30 ENCOUNTER — Other Ambulatory Visit: Payer: Self-pay | Admitting: Family Medicine

## 2023-08-30 ENCOUNTER — Encounter: Payer: Self-pay | Admitting: Family Medicine

## 2023-08-30 ENCOUNTER — Other Ambulatory Visit: Payer: Self-pay

## 2023-08-30 ENCOUNTER — Encounter: Payer: Self-pay | Admitting: Physician Assistant

## 2023-08-30 ENCOUNTER — Other Ambulatory Visit (HOSPITAL_COMMUNITY): Payer: Self-pay

## 2023-08-30 DIAGNOSIS — Z0000 Encounter for general adult medical examination without abnormal findings: Secondary | ICD-10-CM | POA: Insufficient documentation

## 2023-08-30 HISTORY — DX: Encounter for general adult medical examination without abnormal findings: Z00.00

## 2023-08-30 MED ORDER — ALBUTEROL SULFATE HFA 108 (90 BASE) MCG/ACT IN AERS
2.0000 | INHALATION_SPRAY | Freq: Four times a day (QID) | RESPIRATORY_TRACT | 2 refills | Status: AC | PRN
  Filled 2023-08-30: qty 6.7, 25d supply, fill #0
  Filled 2023-12-12 – 2024-02-02 (×2): qty 6.7, 25d supply, fill #1

## 2023-08-30 NOTE — Assessment & Plan Note (Signed)
 Encouraged him to call and schedule a follow-up appointment with urology at [facility].  Advised his blood pressure is no longer a barrier to him having surgery, this was the case several years ago.

## 2023-08-30 NOTE — Assessment & Plan Note (Signed)
 Remains uncontrolled by fasting sugars.  Encouraged continual titration of insulin  as we previously discussed.  He will work on this.

## 2023-08-30 NOTE — Assessment & Plan Note (Signed)
 Discussed colon cancer screening options, he elects for Cologuard, ordered Given prescription to get Tdap at his pharmacy Encouraged to get Shingrix at his pharmacy Advised to schedule eye exam COVID booster given today Had recent CT chest at [facility], will abstract in to count for his annual low-dose CT for lung cancer screening

## 2023-08-30 NOTE — Assessment & Plan Note (Signed)
 Uncontrolled, continue Zyrtec  and Flonase , add ipratropium nasal spray

## 2023-09-04 ENCOUNTER — Other Ambulatory Visit (HOSPITAL_COMMUNITY): Payer: Self-pay

## 2023-09-07 ENCOUNTER — Other Ambulatory Visit (HOSPITAL_COMMUNITY): Payer: Self-pay

## 2023-09-12 ENCOUNTER — Encounter (HOSPITAL_COMMUNITY): Payer: Self-pay

## 2023-09-13 ENCOUNTER — Other Ambulatory Visit: Payer: Self-pay | Admitting: Physician Assistant

## 2023-09-13 ENCOUNTER — Ambulatory Visit (HOSPITAL_COMMUNITY)
Admission: RE | Admit: 2023-09-13 | Discharge: 2023-09-13 | Disposition: A | Discharge status: Home / Self Care | Source: Ambulatory Visit | Attending: Physician Assistant | Admitting: Physician Assistant

## 2023-09-13 ENCOUNTER — Other Ambulatory Visit: Payer: Self-pay

## 2023-09-13 DIAGNOSIS — I5189 Other ill-defined heart diseases: Secondary | ICD-10-CM | POA: Diagnosis not present

## 2023-09-13 MED ORDER — GADOBUTROL 1 MMOL/ML IV SOLN
10.0000 mL | Freq: Once | INTRAVENOUS | Status: AC | PRN
  Administered 2023-09-13: 10 mL via INTRAVENOUS

## 2023-09-14 ENCOUNTER — Ambulatory Visit: Payer: Self-pay | Admitting: Physician Assistant

## 2023-09-14 ENCOUNTER — Other Ambulatory Visit (HOSPITAL_COMMUNITY): Payer: Self-pay

## 2023-09-14 ENCOUNTER — Other Ambulatory Visit: Payer: Self-pay

## 2023-09-14 ENCOUNTER — Encounter: Payer: Self-pay | Admitting: Pharmacist

## 2023-09-14 MED ORDER — INSULIN GLARGINE 100 UNITS/ML SOLOSTAR PEN
18.0000 [IU] | PEN_INJECTOR | Freq: Every day | SUBCUTANEOUS | 1 refills | Status: DC
  Filled 2023-09-14 – 2023-11-20 (×3): qty 15, 83d supply, fill #0

## 2023-09-15 ENCOUNTER — Telehealth: Payer: Self-pay | Admitting: Physician Assistant

## 2023-09-15 ENCOUNTER — Ambulatory Visit (HOSPITAL_COMMUNITY)
Admission: RE | Admit: 2023-09-15 | Discharge: 2023-09-15 | Disposition: A | Discharge status: Home / Self Care | Source: Ambulatory Visit | Attending: Physician Assistant | Admitting: Physician Assistant

## 2023-09-15 DIAGNOSIS — R0989 Other specified symptoms and signs involving the circulatory and respiratory systems: Secondary | ICD-10-CM | POA: Diagnosis not present

## 2023-09-15 NOTE — Telephone Encounter (Signed)
 Spoke with pt regarding his recent results. Discussed Cardiac MRI results as well as Carotid Dopplers. Pt verbalizes understanding. No further questions at this time.

## 2023-09-15 NOTE — Telephone Encounter (Signed)
 Pt returning call to a nurse for result

## 2023-09-20 ENCOUNTER — Other Ambulatory Visit: Payer: Self-pay

## 2023-09-21 ENCOUNTER — Other Ambulatory Visit: Payer: Self-pay

## 2023-09-21 ENCOUNTER — Other Ambulatory Visit (HOSPITAL_COMMUNITY): Payer: Self-pay

## 2023-09-25 ENCOUNTER — Ambulatory Visit (INDEPENDENT_AMBULATORY_CARE_PROVIDER_SITE_OTHER): Admitting: Family Medicine

## 2023-09-25 ENCOUNTER — Encounter: Payer: Self-pay | Admitting: Family Medicine

## 2023-09-25 ENCOUNTER — Other Ambulatory Visit: Payer: Self-pay

## 2023-09-25 VITALS — BP 125/80 | HR 62 | Ht 66.0 in | Wt 202.2 lb

## 2023-09-25 DIAGNOSIS — N2889 Other specified disorders of kidney and ureter: Secondary | ICD-10-CM

## 2023-09-25 DIAGNOSIS — I10 Essential (primary) hypertension: Secondary | ICD-10-CM

## 2023-09-25 DIAGNOSIS — J309 Allergic rhinitis, unspecified: Secondary | ICD-10-CM | POA: Diagnosis not present

## 2023-09-25 DIAGNOSIS — E1169 Type 2 diabetes mellitus with other specified complication: Secondary | ICD-10-CM

## 2023-09-25 DIAGNOSIS — Z0000 Encounter for general adult medical examination without abnormal findings: Secondary | ICD-10-CM | POA: Diagnosis not present

## 2023-09-25 LAB — POCT GLYCOSYLATED HEMOGLOBIN (HGB A1C): HbA1c, POC (controlled diabetic range): 8.8 % — AB (ref 0.0–7.0)

## 2023-09-25 MED ORDER — SHINGRIX 50 MCG/0.5ML IM SUSR: INTRAMUSCULAR | 1 refills | Status: AC

## 2023-09-25 MED ORDER — TETANUS-DIPHTH-ACELL PERTUSSIS 5-2.5-18.5 LF-MCG/0.5 IM SUSP
0.5000 mL | Freq: Once | INTRAMUSCULAR | 0 refills | Status: AC
Start: 2023-09-25 — End: 2023-09-25

## 2023-09-25 NOTE — Progress Notes (Unsigned)
  Date of Visit: 09/25/2023   SUBJECTIVE:   HPI:  Jonathan [name] presents today for follow up. He is accompanied by his wife Jonathan [name].  Diabetes - currently taking lantus  16u daily. Fasting sugars in 200s.   Hypertension - taking hydralazine  25mg  three times daily, hydrochlorothiazide  25mg  daily, lisinopril  40mg  daily, spironolactone  50mg  daily. Has been out of amlodipine  for 1 week.  Allergies - last visit started zyrtec  10mg  daily and flonase , tolerating these with improved symptoms   Renal mass - has not yet scheduled follow up with urology at [facility]  Does have GI appointment scheduled  OBJECTIVE:   BP 125/80   Pulse 62   Ht 5\' 6"  (1.676 m)   Wt 202 lb 3.2 oz (91.7 kg)   SpO2 97%   BMI 32.64 kg/m  Gen: no acute distress, pleasant, cooperative, well appearing HEENT: normocephalic, atraumatic  Heart: regular rate and rhythm, no murmur Lungs: clear to auscultation bilaterally, normal work of breathing  Neuro: alert speech normal grossly nonfocal  ASSESSMENT/PLAN:   Assessment & Plan Type 2 diabetes mellitus with other specified complication, without long-term current use of insulin  (HCC) A1c improved at 8.8, still room for improvement Continue increasing lantus , reviewed upwards titration as long as fasting CBGs >120 Kidney mass Reminded to call urology and schedule follow up  Routine adult health maintenance Reminded about cologuard (also has appointment with GI upcoming) Gave new rx for Shingrix & Tdap to get at his pharmacy Reminded to schedule eye exam Allergic rhinitis, unspecified seasonality, unspecified trigger Improved with cetirizine  and flonase , continue Essential hypertension Well controlled despite out of amlodipine  for 1 week Stop amlodipine , continue other antihypertensives    FOLLOW UP: Follow up in 1 mo for above issues  [place] J. Dawn Eth, MD [facility]

## 2023-09-25 NOTE — Patient Instructions (Signed)
 It was great to see you again today.  Keep going up on insulin  as we discussed until fasting sugar is around 120  Call urology office at [facility] (kidney doctor)  Get Tdap and shingrix at your pharmacy  Get eyes checked  Stop amlodipine  altogether since blood pressure looks good  Follow up in 1 month as scheduled  Be well, Dr. Dawn Eth

## 2023-09-27 NOTE — Assessment & Plan Note (Signed)
 A1c improved at 8.8, still room for improvement Continue increasing lantus , reviewed upwards titration as long as fasting CBGs >120

## 2023-09-27 NOTE — Assessment & Plan Note (Signed)
 Improved with cetirizine  and flonase , continue

## 2023-09-27 NOTE — Assessment & Plan Note (Signed)
 Reminded about cologuard (also has appointment with GI upcoming) Gave new rx for Shingrix & Tdap to get at his pharmacy Reminded to schedule eye exam

## 2023-09-27 NOTE — Assessment & Plan Note (Signed)
 Reminded to call urology and schedule follow up

## 2023-09-27 NOTE — Assessment & Plan Note (Signed)
 Well controlled despite out of amlodipine  for 1 week Stop amlodipine , continue other antihypertensives

## 2023-09-29 ENCOUNTER — Other Ambulatory Visit (HOSPITAL_COMMUNITY): Payer: Self-pay

## 2023-10-12 ENCOUNTER — Other Ambulatory Visit (HOSPITAL_COMMUNITY): Payer: Self-pay

## 2023-10-12 ENCOUNTER — Other Ambulatory Visit: Payer: Self-pay

## 2023-10-12 ENCOUNTER — Encounter: Payer: Self-pay | Admitting: Pharmacist

## 2023-10-13 ENCOUNTER — Inpatient Hospital Stay (HOSPITAL_COMMUNITY)
Admission: EM | Admit: 2023-10-13 | Discharge: 2023-10-15 | DRG: 065 | Disposition: A | Discharge status: Home / Self Care | Attending: Family Medicine | Admitting: Family Medicine

## 2023-10-13 ENCOUNTER — Emergency Department (HOSPITAL_COMMUNITY)

## 2023-10-13 ENCOUNTER — Other Ambulatory Visit: Payer: Self-pay

## 2023-10-13 ENCOUNTER — Encounter (HOSPITAL_COMMUNITY): Payer: Self-pay

## 2023-10-13 DIAGNOSIS — I6522 Occlusion and stenosis of left carotid artery: Secondary | ICD-10-CM | POA: Diagnosis not present

## 2023-10-13 DIAGNOSIS — Z7902 Long term (current) use of antithrombotics/antiplatelets: Secondary | ICD-10-CM

## 2023-10-13 DIAGNOSIS — Z8673 Personal history of transient ischemic attack (TIA), and cerebral infarction without residual deficits: Secondary | ICD-10-CM

## 2023-10-13 DIAGNOSIS — J449 Chronic obstructive pulmonary disease, unspecified: Secondary | ICD-10-CM | POA: Diagnosis present

## 2023-10-13 DIAGNOSIS — R001 Bradycardia, unspecified: Secondary | ICD-10-CM | POA: Diagnosis not present

## 2023-10-13 DIAGNOSIS — I6602 Occlusion and stenosis of left middle cerebral artery: Secondary | ICD-10-CM | POA: Diagnosis not present

## 2023-10-13 DIAGNOSIS — N2889 Other specified disorders of kidney and ureter: Secondary | ICD-10-CM | POA: Diagnosis not present

## 2023-10-13 DIAGNOSIS — I639 Cerebral infarction, unspecified: Secondary | ICD-10-CM | POA: Diagnosis not present

## 2023-10-13 DIAGNOSIS — R4781 Slurred speech: Secondary | ICD-10-CM | POA: Diagnosis not present

## 2023-10-13 DIAGNOSIS — Z789 Other specified health status: Secondary | ICD-10-CM

## 2023-10-13 DIAGNOSIS — Z7982 Long term (current) use of aspirin: Secondary | ICD-10-CM | POA: Diagnosis not present

## 2023-10-13 DIAGNOSIS — Z79899 Other long term (current) drug therapy: Secondary | ICD-10-CM | POA: Diagnosis not present

## 2023-10-13 DIAGNOSIS — E119 Type 2 diabetes mellitus without complications: Secondary | ICD-10-CM | POA: Diagnosis not present

## 2023-10-13 DIAGNOSIS — G8191 Hemiplegia, unspecified affecting right dominant side: Secondary | ICD-10-CM | POA: Diagnosis not present

## 2023-10-13 DIAGNOSIS — F32A Depression, unspecified: Secondary | ICD-10-CM | POA: Diagnosis present

## 2023-10-13 DIAGNOSIS — G4731 Primary central sleep apnea: Secondary | ICD-10-CM | POA: Diagnosis present

## 2023-10-13 DIAGNOSIS — R471 Dysarthria and anarthria: Secondary | ICD-10-CM | POA: Diagnosis present

## 2023-10-13 DIAGNOSIS — R531 Weakness: Secondary | ICD-10-CM | POA: Diagnosis not present

## 2023-10-13 DIAGNOSIS — R29700 NIHSS score 0: Secondary | ICD-10-CM | POA: Diagnosis present

## 2023-10-13 DIAGNOSIS — E1165 Type 2 diabetes mellitus with hyperglycemia: Secondary | ICD-10-CM | POA: Diagnosis not present

## 2023-10-13 DIAGNOSIS — I10 Essential (primary) hypertension: Secondary | ICD-10-CM | POA: Diagnosis not present

## 2023-10-13 DIAGNOSIS — Z6832 Body mass index (BMI) 32.0-32.9, adult: Secondary | ICD-10-CM

## 2023-10-13 DIAGNOSIS — R29701 NIHSS score 1: Secondary | ICD-10-CM

## 2023-10-13 DIAGNOSIS — N179 Acute kidney failure, unspecified: Secondary | ICD-10-CM | POA: Diagnosis present

## 2023-10-13 DIAGNOSIS — G4733 Obstructive sleep apnea (adult) (pediatric): Secondary | ICD-10-CM | POA: Diagnosis present

## 2023-10-13 DIAGNOSIS — I6389 Other cerebral infarction: Principal | ICD-10-CM | POA: Diagnosis present

## 2023-10-13 DIAGNOSIS — Z8249 Family history of ischemic heart disease and other diseases of the circulatory system: Secondary | ICD-10-CM

## 2023-10-13 DIAGNOSIS — E669 Obesity, unspecified: Secondary | ICD-10-CM | POA: Diagnosis present

## 2023-10-13 DIAGNOSIS — Z794 Long term (current) use of insulin: Secondary | ICD-10-CM

## 2023-10-13 DIAGNOSIS — Z91199 Patient's noncompliance with other medical treatment and regimen due to unspecified reason: Secondary | ICD-10-CM

## 2023-10-13 DIAGNOSIS — I779 Disorder of arteries and arterioles, unspecified: Secondary | ICD-10-CM | POA: Diagnosis not present

## 2023-10-13 DIAGNOSIS — I6622 Occlusion and stenosis of left posterior cerebral artery: Secondary | ICD-10-CM | POA: Diagnosis not present

## 2023-10-13 DIAGNOSIS — Z87891 Personal history of nicotine dependence: Secondary | ICD-10-CM

## 2023-10-13 DIAGNOSIS — R29818 Other symptoms and signs involving the nervous system: Secondary | ICD-10-CM | POA: Diagnosis not present

## 2023-10-13 DIAGNOSIS — I6782 Cerebral ischemia: Secondary | ICD-10-CM | POA: Diagnosis not present

## 2023-10-13 DIAGNOSIS — E785 Hyperlipidemia, unspecified: Secondary | ICD-10-CM | POA: Diagnosis present

## 2023-10-13 DIAGNOSIS — I6381 Other cerebral infarction due to occlusion or stenosis of small artery: Secondary | ICD-10-CM | POA: Diagnosis not present

## 2023-10-13 DIAGNOSIS — M6281 Muscle weakness (generalized): Secondary | ICD-10-CM | POA: Diagnosis not present

## 2023-10-13 LAB — DIFFERENTIAL
Abs Immature Granulocytes: 0.06 10*3/uL (ref 0.00–0.07)
Basophils Absolute: 0.1 10*3/uL (ref 0.0–0.1)
Basophils Relative: 1 %
Eosinophils Absolute: 0.5 10*3/uL (ref 0.0–0.5)
Eosinophils Relative: 7 %
Immature Granulocytes: 1 %
Lymphocytes Relative: 29 %
Lymphs Abs: 2.1 10*3/uL (ref 0.7–4.0)
Monocytes Absolute: 0.5 10*3/uL (ref 0.1–1.0)
Monocytes Relative: 6 %
Neutro Abs: 4.2 10*3/uL (ref 1.7–7.7)
Neutrophils Relative %: 56 %

## 2023-10-13 LAB — COMPREHENSIVE METABOLIC PANEL WITH GFR
ALT: 24 U/L (ref 0–44)
AST: 21 U/L (ref 15–41)
Albumin: 3.5 g/dL (ref 3.5–5.0)
Alkaline Phosphatase: 69 U/L (ref 38–126)
Anion gap: 7 (ref 5–15)
BUN: 19 mg/dL (ref 8–23)
CO2: 20 mmol/L — ABNORMAL LOW (ref 22–32)
Calcium: 8.4 mg/dL — ABNORMAL LOW (ref 8.9–10.3)
Chloride: 112 mmol/L — ABNORMAL HIGH (ref 98–111)
Creatinine, Ser: 1.71 mg/dL — ABNORMAL HIGH (ref 0.61–1.24)
GFR, Estimated: 42 mL/min — ABNORMAL LOW (ref >60)
Glucose, Bld: 145 mg/dL — ABNORMAL HIGH (ref 70–99)
Potassium: 3.6 mmol/L (ref 3.5–5.1)
Sodium: 139 mmol/L (ref 135–145)
Total Bilirubin: 0.5 mg/dL (ref 0.0–1.2)
Total Protein: 6.6 g/dL (ref 6.5–8.1)

## 2023-10-13 LAB — RESP PANEL BY RT-PCR (RSV, FLU A&B, COVID)  RVPGX2
Influenza A by PCR: NEGATIVE
Influenza B by PCR: NEGATIVE
Resp Syncytial Virus by PCR: NEGATIVE
SARS Coronavirus 2 by RT PCR: NEGATIVE

## 2023-10-13 LAB — RAPID URINE DRUG SCREEN, HOSP PERFORMED
Amphetamines: NOT DETECTED
Barbiturates: NOT DETECTED
Benzodiazepines: NOT DETECTED
Cocaine: NOT DETECTED
Opiates: NOT DETECTED
Tetrahydrocannabinol: NOT DETECTED

## 2023-10-13 LAB — URINALYSIS, ROUTINE W REFLEX MICROSCOPIC
Bacteria, UA: NONE SEEN
Bilirubin Urine: NEGATIVE
Glucose, UA: NEGATIVE mg/dL
Ketones, ur: NEGATIVE mg/dL
Leukocytes,Ua: NEGATIVE
Nitrite: NEGATIVE
Protein, ur: NEGATIVE mg/dL
Specific Gravity, Urine: 1.005 (ref 1.005–1.030)
pH: 7 (ref 5.0–8.0)

## 2023-10-13 LAB — CBC
HCT: 39.6 % (ref 39.0–52.0)
Hemoglobin: 13.1 g/dL (ref 13.0–17.0)
MCH: 30.1 pg (ref 26.0–34.0)
MCHC: 33.1 g/dL (ref 30.0–36.0)
MCV: 91 fL (ref 80.0–100.0)
Platelets: 272 10*3/uL (ref 150–400)
RBC: 4.35 MIL/uL (ref 4.22–5.81)
RDW: 13.7 % (ref 11.5–15.5)
WBC: 7.4 10*3/uL (ref 4.0–10.5)
nRBC: 0 % (ref 0.0–0.2)

## 2023-10-13 LAB — PROTIME-INR
INR: 1 (ref 0.8–1.2)
Prothrombin Time: 13.3 s (ref 11.4–15.2)

## 2023-10-13 LAB — APTT: aPTT: 28 s (ref 24–36)

## 2023-10-13 LAB — ETHANOL: Alcohol, Ethyl (B): <15 mg/dL (ref <15)

## 2023-10-13 MED ORDER — ACETAMINOPHEN 325 MG PO TABS
650.0000 mg | ORAL_TABLET | ORAL | Status: DC | PRN
  Filled 2023-10-13: qty 2

## 2023-10-13 MED ORDER — ASPIRIN 81 MG PO TBEC
81.0000 mg | DELAYED_RELEASE_TABLET | Freq: Every day | ORAL | Status: DC
  Administered 2023-10-14 – 2023-10-15 (×2): 81 mg via ORAL
  Filled 2023-10-13 (×2): qty 1

## 2023-10-13 MED ORDER — ACETAMINOPHEN 650 MG RE SUPP: 650.0000 mg | RECTAL | Status: DC | PRN

## 2023-10-13 MED ORDER — STROKE: EARLY STAGES OF RECOVERY BOOK
Freq: Once | Status: AC
  Filled 2023-10-13: qty 1

## 2023-10-13 MED ORDER — ACETAMINOPHEN 160 MG/5ML PO SOLN: 650.0000 mg | ORAL | Status: DC | PRN

## 2023-10-13 NOTE — H&P (Addendum)
 Hospital Admission History and Physical Service Pager: [phone]  Patient name: Jonathan [name] Medical record number: [id number] Date of Birth: 06/11/1950 Age: 73 y.o. Gender: male  Primary Care Provider: Donah Laymon PARAS, MD Consultants: Neurology Code Status: Full Code Preferred Emergency Contact:  Contact Information     Name Relation Home Work Mobile   [place] Spouse [phone]  [phone]    [place], [place] Daughter [phone]  [phone]   [facility] Daughter [phone]  [phone]   Chief Complaint: R sided weakness  Assessment and Plan: Jonathan [name] is a 73 y.o. male with PMHx of left frontal subcortical infarction (09/2020), left basal ganglia/internal capsule infarcts (03/2016) with no significant residual deficit, HTN, HLD, T2DM, former tobacco user, COPD, OSA/central sleep apnea not adherent to CPAP, right renal mass presenting with right-sided weakness. Differential for this patient's presentation of this includes: Stroke: Mostly likely due description of R sided weakness, dysarthria, and imaging revealing 2.5 cm acute ischemic infarct of the L basal ganglia.  Brain tumor: Stable mass on the R aspect of foramen magnum on CT head w/o contrast.  Will rule out any metastatic disease with MRI brain w/ contrast given his recent renal mass noted in 2021. Seizures: Unlikely with no reported history of LOC, abnormal movements, staring spells, or changes in awareness. Assessment & Plan Stroke [facility]) MRI brain w/o contrast revealed 2.5 cm acute ischemic infarct of the L basal ganglia, most consistent with stroke.  - Admit to FMTS, attending Dr. Donzetta - Med-Tele, Vital signs per floor - Carb Modified diet, passed swallow screen - VTE prophylaxis: Lovenox  - Imaging: MRI brain w/ contrast and MRA head and neck w/o contrast pending; Echo deferred since recently performed in 06/2023 - Previously on Plavix , but will rule out brain tumor with MRI prior to  reinitiating- Labs: Lipid panel, TSH, BMP,  A1c (deferred, 8.8 on 09/25/23) - Continue ASA 81 mg and hold Plavix  75 mg until MRI brain w/ contrast to rule out need for biopsy or procedures - Normotensive BP goal, since symptom onset was 6/17 - Cardiac monitoring for arrhythmia - PT/OT/SLP consult - Continue smoking cessation and risk factor modification - Neurology consulted, appreciate recommendations - Fall precautions Essential hypertension BP elevated upon admission, but currently improved to 136/87.  Goal is normotensive since out of permissive HTN window. - Restart home BP meds as indicated in AM Type 2 diabetes mellitus (HCC) Last A1c 8.8 at the beginning of June. - Resistant SSI and CBG monitoring - Start Semglee  as indicated per CBG and SAI, takes Lanuts 16u daily at home Chronic health problem OSA/Central Sleep Apnea: not adherent to CPAP d/t discomfort HLD: Rechecking Lipid panel.  Seems to have not been taking his atorvastatin  80 mg daily as prescribed, recently discontinued on 10/13/23, will likely need to reinitiate w/ LDL goal < 70. Right renal mass: 3 cm right renal mass noted in 03/2020, mass seems stable on repeat imaging 6 months after.  According to last PCP note, patient has not followed up with urology at [facility] and follow-up was recommended. Bradycardia: Longstanding history, asymptomatic at baseline  FEN/GI: Carb-Modified Diet VTE Prophylaxis: Lovenox   Disposition: Med-Tele  History of Present Illness:  Jonathan [name] is a 73 y.o. male presenting with right-sided weakness since Tuesday which he reports has progressively worsened.  He reports not feeling good, dragging his right foot, and slurring his words.  Last known well Monday, 6/16.  Denies any falls, numbness, jerking or shaking, headache, vision changes, chest  pain, shortness of breath, abdominal pain, N/V/D/C, dysuria, hematuria, or hematochezia.  Due to history of previous strokes, patient was  on DAPT for 3 months followed by Plavix  alone, and then to ASA and Plavix , unclear if he was supposed to be on ASA alone or continue DAPT from chart review.   In the ED, patient was persistently bradycardic and hypertensive.  CBC within normal limits.  CMP with elevated creatinine to 1.71 and GFR 42.  Otherwise labs are within normal limits.  CXR within normal limits.  CT head without contrast revealed hypoattenuation in the left basal ganglia, no acute intracranial hemorrhage, and remote infarcts in the left basal ganglia and right thalamus.  Similar mass noted in the right aspect of the foramen magnum stable since 2017.  MRI brain without contrast revealed a 2.5 cm acute ischemia of the left basal ganglia with no hemorrhage or mass effect.  Review Of Systems: Per HPI  Pertinent Past Medical History: HTN OSA not on CPAP HLD T2DM Kidney Mass Remainder reviewed in history tab.   Pertinent Past Surgical History: Appendectomy Cataract extraction Retinal detachment repair  Remainder reviewed in history tab.   Pertinent Social History: Tobacco use: Former, quit 1 year; smoked 50 years prior to this Alcohol use: None  Other Substance use: None Lives with wife, daughter, and granddaughters  Pertinent Family History: Mother: Cancer - brain tumor Father: Heart disease, Cirrhosis Remainder reviewed in history tab.   Important Outpatient Medications: Plavix  75 mg daily ASA 81 mg daily Buproprion 300 mg daily Lisinopril  40 mg daily Spironolactone  50 mg daily Hydralazine  25 mg TID Cetirizine  10mg  daily Hydrochlorothiazide  25 mg daily Insuline glargine 16 units daily Flomax  0.4 mg daily Remainder reviewed in medication history.   Objective: BP 136/87   Pulse (!) 49   Temp 97.8 F (36.6 C) (Oral)   Resp 19   Ht 5' 6 (1.676 m)   Wt 91.7 kg   SpO2 96%   BMI 32.63 kg/m  Exam: General: Awake and Alert in NAD HEENT: NCAT. Sclera anicteric. No rhinorrhea. Cardiovascular: RRR. No  M/R/G Respiratory: CTAB, normal WOB on RA. No wheezing, crackles, rhonchi, or diminished breath sounds. Abdomen: Soft, non-tender, non-distended. Bowel sounds normoactive Extremities: Able to move all extremities. No BLE edema, no deformities or significant joint findings. Skin: Warm and dry. No abrasions or rashes noted. Neuro: A&Ox3 CN II: R pupil postsurgical, L pupil is round and reactive CN III, IV,VI: EOMI CV V: Normal sensation in V1, V2, V3 CVII: Symmetric smile and brow raise CN VIII: Normal hearing CN IX,X: Symmetric palate raise  CN XI: 4/5 shoulder shrug on R vs 5/5 on L CN XII: Symmetric tongue protrusion  UE and LE strength 5/5 Normal sensation in UE and LE bilaterally  No ataxia with finger to nose, normal heel to shin   Labs:  CBC BMET  Recent Labs  Lab 10/13/23 1633  WBC 7.4  HGB 13.1  HCT 39.6  PLT 272   Recent Labs  Lab 10/13/23 1633  NA 139  K 3.6  CL 112*  CO2 20*  BUN 19  CREATININE 1.71*  GLUCOSE 145*  CALCIUM  8.4*     UDS: Negative UA: Negative for UTI PT/INR: 13.3/1.0 APTT: 28 Respiratory panel: Negative  EKG: My own interpretation - Sinus bradycardia (54).  T wave inversions noted in the lateral leads (V1 and aVL), stable from prior EKG.  No ST elevation.  No QTc prolongation.   Imaging Studies Performed:  CT Head w/o  Contrast Hypoattenuation in the left basal ganglia as described above concerning for acute versus subacute infarct. Recommend MRI brain for further evaluation.   No acute intracranial hemorrhage.   Remote infarcts in the left basal ganglia and right thalamus. Chronic microvascular ischemic changes.   Similar appearance of mass at the right aspect of the foramen magnum, stable since 2017.  CXR: No active disease.  MRI Brain w/o contrast: A 2.5 cm area of acute ischemia at the left basal ganglia. No hemorrhage or mass effect.  Janna Ferrier, DO 10/14/2023, 1:45 AM PGY-1, [facility]  FPTS Intern  pager: [phone], text pages welcome Secure chat group [facility] Teaching Service

## 2023-10-13 NOTE — ED Provider Notes (Addendum)
 [place] EMERGENCY DEPARTMENT AT [facility] Provider Note   CSN: [id number] Arrival date & time: 10/13/23  1622     Patient presents with: Extremity Weakness   Jonathan [name] is a 73 y.o. male.   Presents for generalized malaise, and weakness.  Reportedly sleeping a lot per family and also some dysarthria and mild confusion.  Last known normal Monday evening with progressively worsening symptoms over the week.  Patient denies headache, vision changes, chest pain, shortness of breath, abdominal pain.  States he feels off but unable to elaborate.   Extremity Weakness       Prior to Admission medications   Medication Sig Start Date End Date Taking? Authorizing Provider  albuterol  (VENTOLIN  HFA) 108 (90 Base) MCG/ACT inhaler Inhale 2 puffs into the lungs every 6 (six) hours as needed. Patient taking differently: Inhale 2 puffs into the lungs every 6 (six) hours as needed for shortness of breath or wheezing. 08/30/23  Yes Donah Laymon PARAS, MD  buPROPion  (WELLBUTRIN  XL) 300 MG 24 hr tablet Take 1 tablet (300 mg total) by mouth every morning. 08/02/23  Yes Donah Laymon PARAS, MD  cetirizine  (ZYRTEC ) 10 MG tablet Take 1 tablet (10 mg total) by mouth daily. 08/03/23  Yes Donah Laymon PARAS, MD  insulin  glargine (LANTUS ) 100 unit/mL SOPN Inject 18 Units into the skin daily. Patient taking differently: Inject 19 Units into the skin daily. 09/14/23  Yes Donah Laymon PARAS, MD  ipratropium (ATROVENT ) 0.03 % nasal spray Place 2 sprays into both nostrils every 12 (twelve) hours. 08/28/23  Yes Donah Laymon PARAS, MD  lisinopril  (ZESTRIL ) 40 MG tablet Take 1 tablet (40 mg total) by mouth daily. 08/02/23  Yes Donah Laymon PARAS, MD  aspirin  EC 81 MG tablet Take 1 tablet (81 mg total) by mouth daily. Swallow whole. 10/18/23   Nicholas Bar, MD  atorvastatin  (LIPITOR ) 80 MG tablet Take 1 tablet (80 mg total) by mouth daily. 10/16/23 11/15/23  Alba Sharper, MD  Blood Glucose  Monitoring Suppl [facility] VERIO) w/Device KIT Use to check blood sugar 3 times daily 07/03/23   Donah Laymon PARAS, MD  Blood Pressure Monitoring (3 SERIES BP MONITOR/UPPER ARM) DEVI Check blood pressure once a day 06/02/21   Jeanelle Layman LITTIE, MD  cilostazol  (PLETAL ) 100 MG tablet Take 1 tablet (100 mg total) by mouth 2 (two) times daily. 10/15/23 11/14/23  Alba Sharper, MD  fluticasone  (FLONASE ) 50 MCG/ACT nasal spray Place 1 spray into both nostrils daily. 07/03/23   Donah Laymon PARAS, MD  glucose blood [facility] VERIO) test strip Use to check blood sugar 3 times daily 08/28/23   Donah Laymon PARAS, MD  Lancet Devices (ONE TOUCH DELICA LANCING DEV) MISC Use to check blood sugar 3 times daily 07/03/23   Donah Laymon PARAS, MD  OneTouch Delica Lancets 33G MISC Use to check blood sugar 3 times daily 07/03/23   Donah Laymon PARAS, MD  Zoster Vaccine Adjuvanted (SHINGRIX ) injection Administer Shingrix  vaccination now and repeat in two months 09/25/23   Donah Laymon PARAS, MD    Allergies: Patient has no known allergies.    Review of Systems  Musculoskeletal:  Positive for extremity weakness.    Updated Vital Signs BP (!) 146/79   Pulse (!) 57   Temp 97.7 F (36.5 C) (Oral)   Resp 16   Ht 5' 6 (1.676 m)   Wt 91.7 kg   SpO2 98%   BMI 32.63 kg/m   Physical Exam Vitals and nursing note reviewed.  HENT:     Head: Normocephalic and atraumatic.     Nose: Nose normal.     Mouth/Throat:     Mouth: Mucous membranes are moist.  Eyes:     Conjunctiva/sclera: Conjunctivae normal.  Cardiovascular:     Rate and Rhythm: Normal rate and regular rhythm.  Pulmonary:     Effort: Pulmonary effort is normal.     Breath sounds: Normal breath sounds.  Abdominal:     General: Abdomen is flat. There is no distension.     Tenderness: There is no abdominal tenderness. There is no guarding or rebound.  Musculoskeletal:        General: Normal range of motion.  Skin:    General: Skin is  warm.     Capillary Refill: Capillary refill takes less than 2 seconds.  Neurological:     Mental Status: He is alert and oriented to person, place, and time.     Comments: Cranial nerves intact.  Equal strength upper extremities, mild weakness in right lower extremity.  Some slight dysarthria that patient endorses, but seemingly speaking intelligibly to me.  Normal finger-nose.  Psychiatric:        Mood and Affect: Mood normal.        Behavior: Behavior normal.     (all labs ordered are listed, but only abnormal results are displayed) Labs Reviewed  COMPREHENSIVE METABOLIC PANEL WITH GFR - Abnormal; Notable for the following components:      Result Value   Chloride 112 (*)    CO2 20 (*)    Glucose, Bld 145 (*)    Creatinine, Ser 1.71 (*)    Calcium  8.4 (*)    GFR, Estimated 42 (*)    All other components within normal limits  URINALYSIS, ROUTINE W REFLEX MICROSCOPIC - Abnormal; Notable for the following components:   Color, Urine STRAW (*)    Hgb urine dipstick SMALL (*)    All other components within normal limits  LIPID PANEL - Abnormal; Notable for the following components:   Triglycerides 193 (*)    HDL 21 (*)    LDL Cholesterol 111 (*)    All other components within normal limits  BASIC METABOLIC PANEL WITH GFR - Abnormal; Notable for the following components:   Chloride 113 (*)    CO2 21 (*)    Glucose, Bld 111 (*)    Creatinine, Ser 1.68 (*)    Calcium  8.4 (*)    GFR, Estimated 43 (*)    All other components within normal limits  GLUCOSE, CAPILLARY - Abnormal; Notable for the following components:   Glucose-Capillary 121 (*)    All other components within normal limits  GLUCOSE, CAPILLARY - Abnormal; Notable for the following components:   Glucose-Capillary 164 (*)    All other components within normal limits  BASIC METABOLIC PANEL WITH GFR - Abnormal; Notable for the following components:   Chloride 113 (*)    CO2 18 (*)    Glucose, Bld 107 (*)    Creatinine,  Ser 1.70 (*)    Calcium  8.0 (*)    GFR, Estimated 42 (*)    All other components within normal limits  GLUCOSE, CAPILLARY - Abnormal; Notable for the following components:   Glucose-Capillary 148 (*)    All other components within normal limits  GLUCOSE, CAPILLARY - Abnormal; Notable for the following components:   Glucose-Capillary 112 (*)    All other components within normal limits  GLUCOSE, CAPILLARY - Abnormal; Notable for the following  components:   Glucose-Capillary 171 (*)    All other components within normal limits  CBG MONITORING, ED - Abnormal; Notable for the following components:   Glucose-Capillary 106 (*)    All other components within normal limits  RESP PANEL BY RT-PCR (RSV, FLU A&B, COVID)  RVPGX2  ETHANOL  PROTIME-INR  APTT  CBC  DIFFERENTIAL  RAPID URINE DRUG SCREEN, HOSP PERFORMED  TSH  PLATELET INHIBITION P2Y12    EKG: EKG Interpretation Date/Time:  Friday October 13 2023 16:31:46 EDT Ventricular Rate:  54 PR Interval:  149 QRS Duration:  92 QT Interval:  448 QTC Calculation: 425 R Axis:   86  Text Interpretation: Sinus or ectopic atrial rhythm Borderline right axis deviation Abnormal T, consider ischemia, lateral leads Confirmed by Armenta Canning [id number]) on 10/14/2023 4:33:11 PM  Radiology: No results found.    .Critical Care  Performed by: Neysa Caron PARAS, DO Authorized by: Neysa Caron PARAS, DO   Critical care provider statement:    Critical care time (minutes):  30   Critical care was necessary to treat or prevent imminent or life-threatening deterioration of the following conditions:  CNS failure or compromise   Critical care was time spent personally by me on the following activities:  Development of treatment plan with patient or surrogate, discussions with consultants, evaluation of patient's response to treatment, examination of patient, ordering and review of laboratory studies, ordering and review of radiographic studies, ordering and  performing treatments and interventions, pulse oximetry, re-evaluation of patient's condition and review of old charts    Medications Ordered in the ED   stroke: early stages of recovery book ( Does not apply Given 10/14/23 1016)  gadobutrol  (GADAVIST ) 1 MMOL/ML injection 9 mL (9 mLs Intravenous Contrast Given 10/14/23 0158)    Clinical Course as of 11/13/23 0702  Fri Oct 13, 2023  1800 CT HEAD WO CONTRAST IMPRESSION: Hypoattenuation in the left basal ganglia as described above concerning for acute versus subacute infarct. Recommend MRI brain for further evaluation.  No acute intracranial hemorrhage.  Remote infarcts in the left basal ganglia and right thalamus. Chronic microvascular ischemic changes.  Similar appearance of mass at the right aspect of the foramen magnum, stable since 2017.   Electronically Signed   By: Donnice Mania M.D.   On: 10/13/2023 17:36   [TY]  2108 MR BRAIN WO CONTRAST IMPRESSION: A 2.5 cm area of acute ischemia at the left basal ganglia. No hemorrhage or mass effect.   Electronically Signed   By: Franky Stanford M.D.   [TY]    Clinical Course User Index [TY] Neysa Caron PARAS, DO                                 Medical Decision Making This is a 72 year old male presenting emergency department by EMS for strokelike symptoms.  Last known normal was Monday night.  Well outside of the window for any sort of intervention.  He is afebrile, slightly hypertensive.  Maintaining oxygen saturation on room air.  Subjective dysarthria from patient, possibly some mild right lower extremity weakness on exam, but largely nonfocal.  EMS did report that he seemingly had difficulty ambulating with a limp seemingly on right lower extremity.  Workup with normal electrolytes.  No transaminitis.  Alcohol level negative.  Urine drug screen negative and does not explain symptoms.  Flu/COVID/RSV negative.  He has no leukocytosis to suggest meningitis or encephalopathy.  CT  head with possible stroke.  Follow-up MRI with positive stroke.  Case discussed with Dr. Roseanna who recommends admission for stroke workup.  Discussed with family medicine for admission.  Amount and/or Complexity of Data Reviewed Independent Historian: EMS    Details: Stable vitals.  Reported limp External Data Reviewed:     Details: History of prior stroke.  Not on blood thinner Labs: ordered. Decision-making details documented in ED Course. Radiology: ordered. Decision-making details documented in ED Course.  Risk Decision regarding hospitalization. Diagnosis or treatment significantly limited by social determinants of health. Risk Details: Poor health literacy      Final diagnoses:  Cerebral infarction, unspecified mechanism [facility])    ED Discharge Orders          Ordered    atorvastatin  (LIPITOR ) 80 MG tablet  Daily        10/15/23 1223    cilostazol  (PLETAL ) 100 MG tablet  2 times daily        10/15/23 1223    Ambulatory referral to Neurology       Comments: Follow up with stroke clinic NP at [facility] in about 4-6 weeks. Thanks.   10/15/23 1151    Increase activity slowly        10/15/23 1223    Diet - low sodium heart healthy        10/15/23 1223    Ambulatory referral to Physical Therapy        10/15/23 1257               Neysa Caron PARAS, DO 10/13/23 2345    Neysa Caron PARAS, DO 11/13/23 [phone]

## 2023-10-13 NOTE — ED Notes (Signed)
 Pt to MRI

## 2023-10-13 NOTE — ED Notes (Signed)
 CCMD called, pt on monitor

## 2023-10-13 NOTE — ED Notes (Signed)
 Please update wife, number under emergency contacts

## 2023-10-13 NOTE — Hospital Course (Addendum)
 Jonathan [name] is a 73 y.o.male with a history of HTN, T2DM, OSA, Stroke, HLD,  who was admitted to the [facility] Medicine Teaching Service at [facility] for stroke. His hospital course is detailed below:  Stroke Patient last well known 6/16, endorses having right-sided numbness and slurring his words since 6/17. Patient is persistently bradycardic and hypertensive in the ED MRI brain without contrast revealed 2.5 cm acute ischemia in the left basal ganglia with no hemorrhage or mass effect.  Neurology was consulted and recommended further imaging with MRI brain with contrast and carotid duplex to rule out metastases from his renal mass.  Imaging revealed no metastases, severe stenosis of prox M1 of L MCA, and moderate stenosis of L PCA.  DAPT was recommended for***.  Restarted Lipitor  80 mg***  Other chronic conditions were medically managed with home medications and formulary alternatives as necessary (HTN, T2DM, OSA/Central Sleep Apnea)  PCP Follow-up Recommendations: Ensure DAPT for *** months Ensure follow up with urology regarding renal mass

## 2023-10-13 NOTE — Consult Note (Signed)
 NEUROLOGY CONSULT NOTE   Date of service: October 13, 2023 Patient Name: Jonathan [name] MRN:  [id number] DOB:  [birth year] Chief Complaint: right sided weakness since Tuesday last week Requesting Provider: Rolinda Climes, DO  History of Present Illness  TIMO HARTWIG is a 73 y.o. male with hx of left frontal subcortical infarction (09/2020) and left basal ganglia/internal capsule infarcts (03/2016) without significant residual, hypertension, hyperlipidemia, type 2 diabetes, former tobacco abuse (quit 1 year ago), COPD/OSA/central sleep apnea overlap not adherent to CPAP, right renal mass pending further workup, right eye postsurgical pupil  He has been experiencing right-sided weakness since Tuesday of last week, which has progressively worsened in a stepwise manner. The weakness primarily affects his walking, particularly impacting the right leg, but he has not experienced any falls. There is no numbness, jerking, or shaking in the arm. He also reports slurred speech, which has remained stable without worsening. No difficulty with word finding or articulation beyond the slurring. No episodes of vision loss or trouble seeing.  Regarding his prior stroke, due to the severity of his atherosclerotic disease including high-grade left ICA, ACA and PCA narrowings he was recommended to be on dual antiplatelet therapy for 3 months followed by Plavix  alone; was noted to have progressive intracranial stenosis on June 2022 MRA compared to prior 2017 MRA he reports he is now on aspirin  alone, but did not have any issues when on Plavix   Regarding his kidney condition, he has a lesion but has not scheduled a biopsy. A repeat scan was done in February. There is no hematuria or other symptoms currently associated with the lesion.  He has a history of sleep apnea and stopped using a CPAP machine two years ago due to discomfort.  No nausea, vomiting, blood in urine or stool, shortness of breath, heart palpitations,  fevers, chills, swelling in the arms or legs, rashes, skin changes, headaches, or confusion (although per ED notes family reported some concern for confusion/disorientation).  Socially, he quit smoking one year ago, which was a difficult but significant change.   LKW: Tuesday, June 10 Modified rankin score: 0-Completely asymptomatic and back to baseline post- stroke IV Thrombolysis: No, out of the window EVT: No, exam not consistent with LVO   NIHSS components Score: Comment  1a Level of Conscious 0[x]  1[]  2[]  3[]      1b LOC Questions 0[x]  1[]  2[]       1c LOC Commands 0[x]  1[]  2[]       2 Best Gaze 0[x]  1[]  2[]       3 Visual 0[x]  1[]  2[]  3[]      4 Facial Palsy 0[x]  1[]  2[]  3[]      5a Motor Arm - left 0[x]  1[]  2[]  3[]  4[]  UN[]    5b Motor Arm - Right 0[x]  1[]  2[]  3[]  4[]  UN[]    6a Motor Leg - Left 0[x]  1[]  2[]  3[]  4[]  UN[]    6b Motor Leg - Right 0[x]  1[]  2[]  3[]  4[]  UN[]    7 Limb Ataxia 0[x]  1[]  2[]  UN[]      8 Sensory 0[x]  1[]  2[]  UN[]      9 Best Language 0[x]  1[]  2[]  3[]      10 Dysarthria 0[]  1[x]  2[]  UN[]      11 Extinct. and Inattention 0[x]  1[]  2[]       TOTAL:       ROS  Comprehensive ROS performed and pertinent positives documented in HPI    Past History   Past Medical History:  Diagnosis Date   Anxiety  Arthritis    COPD (chronic obstructive pulmonary disease) (HCC)    Depression    Diabetes mellitus without complication (HCC)    Diarrhea    History of kidney stones    HTN (hypertension)    Hyperlipidemia    Hypertensive retinopathy    OU   Pneumonia    Retinal detachment    Rheg. RD OD   Routine adult health maintenance 08/30/2023   Sleep apnea    does not use cpap   Stroke [facility])    denies any deficits    Past Surgical History:  Procedure Laterality Date   CATARACT EXTRACTION Bilateral    Dr. Oliver Betters     EYE SURGERY Bilateral    Cat Sx OU; RD repair OD   GAS INSERTION Right 03/13/2019   Procedure: INSERTION OF GAS (C3F8) RIGHT EYE;   Surgeon: Ronelle Coffee, MD;  Location: [facility] OR;  Service: Ophthalmology;  Laterality: Right;   LAPAROSCOPIC APPENDECTOMY N/A 10/03/2020   Procedure: APPENDECTOMY LAPAROSCOPIC;  Surgeon: Shela Derby, MD;  Location: [facility] OR;  Service: General;  Laterality: N/A;   LASER PHOTO ABLATION Right 03/13/2019   Procedure: LASER PHOTO  ABLATION RIGHT EYE;  Surgeon: Ronelle Coffee, MD;  Location: [facility] OR;  Service: Ophthalmology;  Laterality: Right;   none     PARS PLANA VITRECTOMY Right 03/13/2019   Procedure: 25 GAUGE PARS PLANA VITRECTOMY WITH  INTRAOCULAR LENSE EXPLANTATION RIGHT EYE.;  Surgeon: Ronelle Coffee, MD;  Location: [facility] OR;  Service: Ophthalmology;  Laterality: Right;   PARS PLANA VITRECTOMY Right 06/27/2019   Procedure: PARS PLANA VITRECTOMY WITH 25 GAUGE;  Surgeon: Ronelle Coffee, MD;  Location: [facility] OR;  Service: Ophthalmology;  Laterality: Right;   PHOTOCOAGULATION WITH LASER Left 03/13/2019   Procedure: INDIRECT PHOTOCOAGULATION WITH LASER LEFT EYE;  Surgeon: Ronelle Coffee, MD;  Location: [facility] OR;  Service: Ophthalmology;  Laterality: Left;   PLACEMENT AND SUTURE OF SECONDARY INTRAOCULAR LENS Right 06/27/2019   Procedure: PLACEMENT AND SUTURE OF SECONDARY INTRAOCULAR LENS;  Surgeon: Ronelle Coffee, MD;  Location: [facility] OR;  Service: Ophthalmology;  Laterality: Right;   RETINAL DETACHMENT SURGERY Right 03/13/2019   PPV for repair of rheg. RD - Dr. Ronelle Coffee    Family History: Family History  Problem Relation Age of Onset   Cancer Mother        brain tumor   Heart disease Father    Cirrhosis Father     Social History  reports that he quit smoking about 2 years ago. His smoking use included cigarettes. He has a 50 pack-year smoking history. He has never used smokeless tobacco. He reports that he does not drink alcohol and does not use drugs.  No Known Allergies  Medications  No current facility-administered medications for this encounter.  Current Outpatient Medications:    albuterol   (VENTOLIN  HFA) 108 (90 Base) MCG/ACT inhaler, Inhale 2 puffs into the lungs every 6 (six) hours as needed., Disp: 6.7 g, Rfl: 2   aspirin  EC 81 MG tablet, Take 1 tablet (81 mg total) by mouth daily. Swallow whole., Disp: 90 tablet, Rfl: 3   atorvastatin  (LIPITOR ) 80 MG tablet, Take 1 tablet (80 mg total) by mouth daily., Disp: 90 tablet, Rfl: 3   Blood Glucose Monitoring Suppl (ONETOUCH VERIO) w/Device KIT, Use to check blood sugar 3 times daily, Disp: 1 kit, Rfl: 0   Blood Pressure Monitoring (3 SERIES BP MONITOR/UPPER ARM) DEVI, Check blood pressure once a day, Disp: 1 each, Rfl: 0  buPROPion  (WELLBUTRIN  XL) 300 MG 24 hr tablet, Take 1 tablet (300 mg total) by mouth every morning., Disp: 90 tablet, Rfl: 1   cetirizine  (ZYRTEC ) 10 MG tablet, Take 1 tablet (10 mg total) by mouth daily., Disp: 90 tablet, Rfl: 1   clopidogrel  (PLAVIX ) 75 MG tablet, Take 1 tablet (75 mg total) by mouth daily., Disp: 90 tablet, Rfl: 1   fluticasone  (FLONASE ) 50 MCG/ACT nasal spray, Place 1 spray into both nostrils daily., Disp: 16 g, Rfl: 2   glucose blood (ONETOUCH VERIO) test strip, Use to check blood sugar 3 times daily, Disp: 100 each, Rfl: 3   hydrALAZINE  (APRESOLINE ) 25 MG tablet, Take 1 tablet (25 mg total) by mouth 3 (three) times daily., Disp: 270 tablet, Rfl: 1   hydrochlorothiazide  (HYDRODIURIL ) 25 MG tablet, Take 1 tablet (25 mg total) by mouth daily., Disp: 90 tablet, Rfl: 1   insulin  glargine (LANTUS ) 100 unit/mL SOPN, Inject 18 Units into the skin daily. (Patient taking differently: Inject 16 Units into the skin daily.), Disp: 15 mL, Rfl: 1   ipratropium (ATROVENT ) 0.03 % nasal spray, Place 2 sprays into both nostrils every 12 (twelve) hours., Disp: 30 mL, Rfl: 3   Lancet Devices (ONE TOUCH DELICA LANCING DEV) MISC, Use to check blood sugar 3 times daily, Disp: 1 each, Rfl: 0   lisinopril  (ZESTRIL ) 40 MG tablet, Take 1 tablet (40 mg total) by mouth daily., Disp: 90 tablet, Rfl: 1   OneTouch Delica Lancets  33G MISC, Use to check blood sugar 3 times daily, Disp: 100 each, Rfl: 3   spironolactone  (ALDACTONE ) 50 MG tablet, Take 1 tablet (50 mg total) by mouth daily., Disp: 90 tablet, Rfl: 1   tamsulosin  (FLOMAX ) 0.4 MG CAPS capsule, Take 1 capsule (0.4 mg total) by mouth daily., Disp: 90 capsule, Rfl: 0   Zoster Vaccine Adjuvanted (SHINGRIX ) injection, Administer Shingrix  vaccination now and repeat in two months, Disp: 1 each, Rfl: 1  Vitals   Vitals:   10/13/23 1810 10/13/23 1915 10/13/23 2009 10/13/23 2100  BP: 136/67 130/70 (!) 171/66 (!) 166/73  Pulse: (!) 44 (!) 54 (!) 46 (!) 45  Resp: (!) 22 19 15 18   Temp:   97.9 F (36.6 C)   TempSrc:   Oral   SpO2: 99% 99% 100% 100%  Weight:      Height:        Body mass index is 32.63 kg/m.   Physical Exam   Constitutional: Appears well-developed and well-nourished.  Psych: Affect calm, cooperative Eyes: No scleral injection HENT: No oropharyngeal obstruction.  MSK: no joint deformities.  Cardiovascular: Normal rate and regular rhythm. Perfusing extremities well Respiratory: Effort normal, non-labored breathing GI: Soft.  No distension. There is no tenderness.  Skin: Warm dry and intact visible skin  Neurologic Examination   Mental Status: Patient is awake, alert, oriented to person, place, month, year, and situation. Patient is able to give a clear and coherent history. No signs of aphasia or neglect Cranial Nerves: II: Visual Fields are full.  Right pupil is ovoid/postsurgical, left is round and reactive III,IV, VI: EOMI without ptosis or diploplia.  V: Facial sensation is symmetric to light touch VII: Facial movement is notable for subtle right facial nasolabial fold flattening at rest, appears baseline from prior picture in chart, but symmetric activation.  VIII: hearing is intact to voice X: Uvula elevates symmetrically, Mallampati score 3 XI: Shoulder shrug 4+ on the right and 5 on the left XII: tongue is midline without  atrophy or fasciculations.  Motor: Tone is normal. Bulk is normal. 5/5 strength was present in all four extremities, slightly reduced fine finger tapping with the right hand Sensory: Sensation is symmetric to light touch and temperature in the arms and legs. Deep Tendon Reflexes: 2+ and symmetric in the brachioradialis and, 2+ left patellae, difficult to elicit right patella.  Cerebellar: FNF and HKS are intact bilaterally Gait:  Able to rise on heels and toes.  However mild right hemiparesis on casual gait   Labs/Imaging/Neurodiagnostic studies   CBC:  Recent Labs  Lab 10-17-2023 1633  WBC 7.4  NEUTROABS 4.2  HGB 13.1  HCT 39.6  MCV 91.0  PLT 272   Basic Metabolic Panel:  Lab Results  Component Value Date   NA 139 17-Oct-2023   K 3.6 2023/10/17   CO2 20 (L) 2023/10/17   GLUCOSE 145 (H) October 17, 2023   BUN 19 10-17-2023   CREATININE 1.71 (H) 10/17/23   CALCIUM  8.4 (L) October 17, 2023   GFRNONAA 42 (L) 10-17-2023   GFRAA 62 05/19/2020   Lipid Panel:  Lab Results  Component Value Date   CHOL 227 (H) 11/14/2022   HDL 32 (L) 11/14/2022   LDLCALC 150 (H) 11/14/2022   LDLDIRECT 90.0 10/03/2020   TRIG 245 (H) 11/14/2022   CHOLHDL 7.1 (H) 11/14/2022     HgbA1c:  Lab Results  Component Value Date   HGBA1C 8.8 (A) 09/25/2023   Urine Drug Screen:     Component Value Date/Time   LABOPIA NONE DETECTED 10/03/2020 0135   COCAINSCRNUR NONE DETECTED 10/03/2020 0135   LABBENZ NONE DETECTED 10/03/2020 0135   AMPHETMU NONE DETECTED 10/03/2020 0135   THCU NONE DETECTED 10/03/2020 0135   LABBARB NONE DETECTED 10/03/2020 0135    Alcohol Level     Component Value Date/Time   ETH <15 10/17/23 1652   INR  Lab Results  Component Value Date   INR 1.0 October 17, 2023   APTT  Lab Results  Component Value Date   APTT 28 2023/10/17   AED levels: No results found for: PHENYTOIN, ZONISAMIDE, LAMOTRIGINE, LEVETIRACETA  CT Head without contrast(Personally  reviewed):  Hypoattenuation in the left basal ganglia as described above concerning for acute versus subacute infarct. Recommend MRI brain for further evaluation.   No acute intracranial hemorrhage.   Remote infarcts in the left basal ganglia and right thalamus. Chronic microvascular ischemic changes.   Similar appearance of mass at the right aspect of the foramen magnum, stable since 2017.   MRI Brain(Personally reviewed): A 2.5 cm area of acute ischemia at the left basal ganglia. No hemorrhage or mass effect.  ECHO 07/13/2023  1. Left ventricular ejection fraction, by estimation, is 60 to 65%. The  left ventricle has normal function. The left ventricle has no regional  wall motion abnormalities. There is mild left ventricular hypertrophy.  Left ventricular diastolic parameters  are consistent with Grade I diastolic dysfunction (impaired relaxation).   2. Right ventricular systolic function is normal. The right ventricular  size is normal. Tricuspid regurgitation signal is inadequate for assessing  PA pressure.   3. The mitral valve is normal in structure. No evidence of mitral valve  regurgitation. No evidence of mitral stenosis.   4. The aortic valve is grossly normal. Aortic valve regurgitation is not  visualized. No aortic stenosis is present.   5. The inferior vena cava is normal in size with greater than 50%  respiratory variability, suggesting right atrial pressure of 3 mmHg.   Conclusion(s)/Recommendation(s): The low attenuation  findings on outside  CT (images cannot be independently reviewed) are not well visualized on  echocardiography. If clinical concern persists consider cardiology  consultation and cardiac MRI given history   of renal mass. On review of prior cross sectional imaging from 2022,  these findings are likely present, and most likely represent epicardial  and intramyocardial fat.   Cardiac MRI: 1. No right atrial mass seen. There is lipomatous  hypertrophy of the interatrial septum 2. Intramyocardial fat in LV mid anterior wall (this is likely the LV filling defect described on the CT report) 3. Normal LV size, moderate hypertrophy, and low normal systolic function (EF 50%) 4.  Small RV size with normal systolic function (EF 52%) 5. RV insertion site LGE, which is a nonspecific scar pattern often seen in setting of elevated pulmonary pressures     ASSESSMENT   MUNEEB VERAS is a 73 y.o. male with a past medical history significant for multiple stroke risk factors including hypertension, hyperlipidemia, diabetes, prior strokes felt to be secondary to intracranial atherosclerotic disease, obstructive sleep apnea untreated, former smoking.  Of note he also has a renal mass concerning for malignancy.  I do think that the MRI findings are most likely to represent stroke but given the progression of his symptoms over a week, I do think it is also prudent to obtain an MRI brain with contrast to exclude underlying mass given the propensity for renal cell carcinoma to metastasize to the brain, in addition to the typical stroke workup  Certainly given the severity of his intracranial atherosclerotic disease described previously (vessel imaging this admission pending), I do think it would be prudent to escalate his antiplatelet regimen.  However I will hold off pending the MRI brain with contrast to confirm he does not need any biopsy or other procedures for which Plavix  may need to be held.  Of note he is already past the window of maximal benefit from Plavix  (first 7 days from symptom onset), but if there is continued evidence that this is secondary to large vessel atherosclerotic disease may be indicated for Plavix  for full 90 days  RECOMMENDATIONS   # Likely left basal ganglia stroke - Stroke labs fasting lipid panel; goal LDL less than 70,  - recent A1c from 09/25/2023 8.8%, appreciate primary team / PCP adjustment of medications to  achieve goal A1c less than 7% - MRI brain with contrast to exclude any metastatic disease  - MRA of the brain without contrast and Carotid duplex or MRA neck w/wo  - Frequent neuro checks - Echocardiogram recently completed, no need to repeat unless felt to be medically needed - Continue aspirin  81 mg daily - Holding off on Plavix  for now as discussed above - Risk factor modification - Telemetry monitoring - Blood pressure goal normotension as he is out of the window for permissive hypertension - PT consult, OT consult, Speech consult, unless patient is back to baseline - Stroke team to follow in consultation  ______________________________________________________________________  Geraldine Kling MD-PhD [facility] [phone] Available 7 PM to 7 AM, outside of these hours please call Neurologist on call as listed on Amion.

## 2023-10-13 NOTE — ED Triage Notes (Signed)
 Pt to ED with c/o weakness x2 days, particularly to right-side extremities. Pt A&Ox4. Good grips, no facial droop noted.

## 2023-10-14 ENCOUNTER — Inpatient Hospital Stay (HOSPITAL_COMMUNITY)

## 2023-10-14 ENCOUNTER — Other Ambulatory Visit (HOSPITAL_COMMUNITY): Payer: Self-pay

## 2023-10-14 DIAGNOSIS — I6602 Occlusion and stenosis of left middle cerebral artery: Secondary | ICD-10-CM | POA: Diagnosis not present

## 2023-10-14 DIAGNOSIS — Z7902 Long term (current) use of antithrombotics/antiplatelets: Secondary | ICD-10-CM

## 2023-10-14 DIAGNOSIS — Z87891 Personal history of nicotine dependence: Secondary | ICD-10-CM

## 2023-10-14 DIAGNOSIS — Z7982 Long term (current) use of aspirin: Secondary | ICD-10-CM

## 2023-10-14 DIAGNOSIS — I779 Disorder of arteries and arterioles, unspecified: Secondary | ICD-10-CM

## 2023-10-14 DIAGNOSIS — I639 Cerebral infarction, unspecified: Secondary | ICD-10-CM | POA: Diagnosis not present

## 2023-10-14 DIAGNOSIS — E1165 Type 2 diabetes mellitus with hyperglycemia: Secondary | ICD-10-CM

## 2023-10-14 DIAGNOSIS — Z789 Other specified health status: Secondary | ICD-10-CM

## 2023-10-14 LAB — BASIC METABOLIC PANEL WITH GFR
Anion gap: 5 (ref 5–15)
BUN: 17 mg/dL (ref 8–23)
CO2: 21 mmol/L — ABNORMAL LOW (ref 22–32)
Calcium: 8.4 mg/dL — ABNORMAL LOW (ref 8.9–10.3)
Chloride: 113 mmol/L — ABNORMAL HIGH (ref 98–111)
Creatinine, Ser: 1.68 mg/dL — ABNORMAL HIGH (ref 0.61–1.24)
GFR, Estimated: 43 mL/min — ABNORMAL LOW (ref >60)
Glucose, Bld: 111 mg/dL — ABNORMAL HIGH (ref 70–99)
Potassium: 3.8 mmol/L (ref 3.5–5.1)
Sodium: 139 mmol/L (ref 135–145)

## 2023-10-14 LAB — TSH: TSH: 1.572 u[IU]/mL (ref 0.350–4.500)

## 2023-10-14 LAB — LIPID PANEL
Cholesterol: 171 mg/dL (ref 0–200)
HDL: 21 mg/dL — ABNORMAL LOW (ref >40)
LDL Cholesterol: 111 mg/dL — ABNORMAL HIGH (ref 0–99)
Total CHOL/HDL Ratio: 8.1 ratio
Triglycerides: 193 mg/dL — ABNORMAL HIGH (ref <150)
VLDL: 39 mg/dL (ref 0–40)

## 2023-10-14 LAB — GLUCOSE, CAPILLARY
Glucose-Capillary: 121 mg/dL — ABNORMAL HIGH (ref 70–99)
Glucose-Capillary: 164 mg/dL — ABNORMAL HIGH (ref 70–99)
Glucose-Capillary: 148 mg/dL — ABNORMAL HIGH (ref 70–99)

## 2023-10-14 LAB — CBG MONITORING, ED: Glucose-Capillary: 106 mg/dL — ABNORMAL HIGH (ref 70–99)

## 2023-10-14 MED ORDER — INSULIN ASPART 100 UNIT/ML IJ SOLN
0.0000 [IU] | Freq: Three times a day (TID) | INTRAMUSCULAR | Status: DC
  Administered 2023-10-14: 4 [IU] via SUBCUTANEOUS
  Administered 2023-10-14: 3 [IU] via SUBCUTANEOUS
  Administered 2023-10-15: 4 [IU] via SUBCUTANEOUS

## 2023-10-14 MED ORDER — HYDROCHLOROTHIAZIDE 25 MG PO TABS: 25.0000 mg | ORAL_TABLET | Freq: Every day | ORAL | Status: DC

## 2023-10-14 MED ORDER — LISINOPRIL 20 MG PO TABS
40.0000 mg | ORAL_TABLET | Freq: Every day | ORAL | Status: DC
  Administered 2023-10-14 – 2023-10-15 (×2): 40 mg via ORAL
  Filled 2023-10-14 (×2): qty 2

## 2023-10-14 MED ORDER — GADOBUTROL 1 MMOL/ML IV SOLN
9.0000 mL | Freq: Once | INTRAVENOUS | Status: AC | PRN
  Administered 2023-10-14: 9 mL via INTRAVENOUS

## 2023-10-14 MED ORDER — CLOPIDOGREL BISULFATE 75 MG PO TABS
75.0000 mg | ORAL_TABLET | Freq: Every day | ORAL | Status: DC
  Administered 2023-10-14 – 2023-10-15 (×2): 75 mg via ORAL
  Filled 2023-10-14 (×2): qty 1

## 2023-10-14 MED ORDER — SPIRONOLACTONE 25 MG PO TABS: 50.0000 mg | ORAL_TABLET | Freq: Every day | ORAL | Status: DC

## 2023-10-14 MED ORDER — CLOPIDOGREL BISULFATE 75 MG PO TABS: 75.0000 mg | ORAL_TABLET | Freq: Every day | ORAL | Status: DC

## 2023-10-14 MED ORDER — ATORVASTATIN CALCIUM 80 MG PO TABS
80.0000 mg | ORAL_TABLET | Freq: Every day | ORAL | Status: DC
  Administered 2023-10-14 – 2023-10-15 (×2): 80 mg via ORAL
  Filled 2023-10-14 (×2): qty 1

## 2023-10-14 MED ORDER — INSULIN GLARGINE-YFGN 100 UNIT/ML ~~LOC~~ SOLN
12.0000 [IU] | Freq: Every day | SUBCUTANEOUS | Status: DC
  Administered 2023-10-14 – 2023-10-15 (×2): 12 [IU] via SUBCUTANEOUS
  Filled 2023-10-14 (×2): qty 0.12

## 2023-10-14 MED ORDER — ENOXAPARIN SODIUM 40 MG/0.4ML IJ SOSY
40.0000 mg | PREFILLED_SYRINGE | INTRAMUSCULAR | Status: DC
  Administered 2023-10-14 – 2023-10-15 (×2): 40 mg via SUBCUTANEOUS
  Filled 2023-10-14 (×2): qty 0.4

## 2023-10-14 MED ORDER — BUPROPION HCL ER (XL) 150 MG PO TB24
300.0000 mg | ORAL_TABLET | Freq: Every morning | ORAL | Status: DC
  Administered 2023-10-14 – 2023-10-15 (×2): 300 mg via ORAL
  Filled 2023-10-14 (×2): qty 2

## 2023-10-14 NOTE — ED Notes (Signed)
 Pt to MRI

## 2023-10-14 NOTE — Plan of Care (Signed)

## 2023-10-14 NOTE — Assessment & Plan Note (Signed)
 OSA/Central Sleep Apnea: not adherent to CPAP d/t discomfort HLD: Amlodipine  80 mg daily, may have not been taking this at home Right renal mass: 3 cm right renal mass noted in 03/2020, mass seems stable on repeat imaging 6 months after.  According to last PCP note, patient has not followed up with urology at [facility] and follow-up was recommended. Bradycardia: Longstanding history, asymptomatic at baseline

## 2023-10-14 NOTE — Assessment & Plan Note (Signed)
 BP elevated upon admission, but currently improved to 136/87.  Goal is normotensive since out of permissive HTN window. - Restart home BP meds as indicated in AM

## 2023-10-14 NOTE — Assessment & Plan Note (Addendum)
 BP elevated on admission but has since improved.  Has had some soft BP overnight, hold off on restarting home medications (HCTZ, hydralazine , lisinopril , spironolactone ) -Restart meds as appropriate

## 2023-10-14 NOTE — Evaluation (Signed)
 Physical Therapy Evaluation Patient Details Name: Jonathan [name] MRN: [id number] DOB: [birth year] Today's Date: 10/14/2023  History of Present Illness  Pt is a 73 y.o. M presenting to [facility] on 10/13/23 w/ R sided weakness. IMG revealed acute ischemic infarct of L basal ganglia. PMH is significant for left frontal subcortical infarction (09/2020), left basal ganglia/internal capsule infarcts (03/2016) with no significant residual deficit, HTN, HLD, T2DM, former tobacco user, COPD, OSA/central sleep apnea not adherent to CPAP.   Clinical Impression  Prior to admittance pt was mobilizing independently, not using an AD. Pt presents to evaluation with deficits in mobility, balance, strength, power, and activity tolerance, all limiting pt's ability to mobilize near baseline. Pt was able to ambulate without an AD and no physical assistance given. Throughout gait, pt demonstrates R foot drag and is able to correct with cueing, but quickly fatigues returning to R foot drag. Pt was encouraged to perform supine PF/DF throughout the day to improve endurance of DF. Pt was also encouraged to use SPC at home at discharge to improve balance and reduce risk of falls given current R foot drag. PT will continue to treat pt while he is admitted. Recommending OPPT at discharge to address remaining mobility deficits and optimize return to PLOF.         If plan is discharge home, recommend the following: A little help with walking and/or transfers;A little help with bathing/dressing/bathroom;Assist for transportation;Help with stairs or ramp for entrance   Can travel by private vehicle        Equipment Recommendations None recommended by PT  Recommendations for Other Services       Functional Status Assessment Patient has had a recent decline in their functional status and demonstrates the ability to make significant improvements in function in a reasonable and predictable amount of time.     Precautions /  Restrictions Precautions Precautions: Fall Recall of Precautions/Restrictions: Impaired Precaution/Restrictions Comments: reminded pt to only mobilize with staff Restrictions Weight Bearing Restrictions Per Provider Order: No      Mobility  Bed Mobility Overal bed mobility: Needs Assistance Bed Mobility: Supine to Sit, Sit to Supine     Supine to sit: Supervision, HOB elevated Sit to supine: Supervision, HOB elevated   General bed mobility comments: increased time to complete    Transfers Overall transfer level: Needs assistance Equipment used: None Transfers: Sit to/from Stand Sit to Stand: Supervision           General transfer comment: increased time to complete    Ambulation/Gait Ambulation/Gait assistance: Supervision Gait Distance (Feet): 75 Feet Assistive device: None Gait Pattern/deviations: Step-through pattern, Decreased stride length, Decreased dorsiflexion - right Gait velocity: reduced Gait velocity interpretation: <1.8 ft/sec, indicate of risk for recurrent falls   General Gait Details: Pt utilizies reciprocal gait pattern and demonstrates R foot drag. VC given to strike with heel and pt is able to complete but quickly fatigues returning to more of a midfoot strike with R foot drag.  Stairs            Wheelchair Mobility     Tilt Bed    Modified Rankin (Stroke Patients Only)       Balance Overall balance assessment: Needs assistance Sitting-balance support: No upper extremity supported, Feet supported Sitting balance-Leahy Scale: Good Sitting balance - Comments: sitting EOB   Standing balance support: No upper extremity supported, During functional activity Standing balance-Leahy Scale: Good  Pertinent Vitals/Pain Pain Assessment Pain Assessment: No/denies pain    Home Living Family/patient expects to be discharged to:: Private residence Living Arrangements: Spouse/significant  other Available Help at Discharge: Family;Available PRN/intermittently Type of Home: Mobile home Home Access: Ramped entrance       Home Layout: One level Home Equipment: Cane - single point      Prior Function Prior Level of Function : Independent/Modified Independent;Working/employed             Mobility Comments: pt reports independent with mobility and was using SPC at one point in time but not recently ADLs Comments: independent     Extremity/Trunk Assessment   Upper Extremity Assessment Upper Extremity Assessment: RUE deficits/detail RUE:  (grossly 4-/5) RUE Sensation: WNL RUE Coordination: decreased fine motor (impaired finger nose finger. increased difficulty and reduced accuracy)    Lower Extremity Assessment Lower Extremity Assessment: Generalized weakness;RLE deficits/detail RLE:  (grossly 4-/5 for hip flexion and knee extension. observed R foot drag throughout gait that worsened with fatigue. In supine, pt is able to obtain neutral DF.) RLE Sensation: WNL RLE Coordination: decreased gross motor    Cervical / Trunk Assessment Cervical / Trunk Assessment: Kyphotic  Communication   Communication Communication: No apparent difficulties    Cognition Arousal: Alert Behavior During Therapy: WFL for tasks assessed/performed   PT - Cognitive impairments: No apparent impairments                         Following commands: Intact       Cueing Cueing Techniques: Verbal cues     General Comments General comments (skin integrity, edema, etc.): no signs of acute distress    Exercises     Assessment/Plan    PT Assessment Patient needs continued PT services  PT Problem List Decreased strength;Decreased activity tolerance;Decreased range of motion;Decreased balance;Decreased mobility;Decreased coordination;Decreased knowledge of use of DME;Decreased safety awareness       PT Treatment Interventions DME instruction;Gait training;Functional  mobility training;Therapeutic activities;Therapeutic exercise;Balance training;Neuromuscular re-education;Patient/family education;Manual techniques;Modalities    PT Goals (Current goals can be found in the Care Plan section)  Acute Rehab PT Goals Patient Stated Goal: to go home PT Goal Formulation: With patient Time For Goal Achievement: 10/28/23 Potential to Achieve Goals: Good    Frequency Min 2X/week     Co-evaluation               AM-PAC PT 6 Clicks Mobility  Outcome Measure Help needed turning from your back to your side while in a flat bed without using bedrails?: A Little Help needed moving from lying on your back to sitting on the side of a flat bed without using bedrails?: A Little Help needed moving to and from a bed to a chair (including a wheelchair)?: A Little Help needed standing up from a chair using your arms (e.g., wheelchair or bedside chair)?: A Little Help needed to walk in hospital room?: A Little Help needed climbing 3-5 steps with a railing? : A Little 6 Click Score: 18    End of Session Equipment Utilized During Treatment: Gait belt Activity Tolerance: Patient tolerated treatment well Patient left: in bed;with call bell/phone within reach;with bed alarm set Nurse Communication: Mobility status PT Visit Diagnosis: Unsteadiness on feet (R26.81);Muscle weakness (generalized) (M62.81);Other abnormalities of gait and mobility (R26.89);Other symptoms and signs involving the nervous system (R29.898)    Time: 8871-8852 PT Time Calculation (min) (ACUTE ONLY): 19 min   Charges:   PT Evaluation $PT  Eval Low Complexity: 1 Low   PT General Charges $$ ACUTE PT VISIT: 1 Visit         Leontine Hilt, SPT Acute Rehab [phone]   Leontine Hilt 10/14/2023, 12:37 PM

## 2023-10-14 NOTE — Assessment & Plan Note (Signed)
 OSA/Central Sleep Apnea: not adherent to CPAP d/t discomfort HLD: Rechecking Lipid panel.  Seems to have not been taking his atorvastatin  80 mg daily as prescribed, recently discontinued on 10/13/23, will likely need to reinitiate w/ LDL goal < 70. Right renal mass: 3 cm right renal mass noted in 03/2020, mass seems stable on repeat imaging 6 months after.  According to last PCP note, patient has not followed up with urology at [facility] and follow-up was recommended. Bradycardia: Longstanding history, asymptomatic at baseline
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
## 2023-10-14 NOTE — ED Notes (Addendum)
 Family updated as to patient's status. Called spouse.

## 2023-10-14 NOTE — ED Notes (Signed)
 Pt back from MRI

## 2023-10-14 NOTE — Assessment & Plan Note (Signed)
 Last A1c 8.8 at the beginning of June. - Resistant SSI and CBG monitoring - Start Semglee  as indicated per CBG and SAI, takes Lantus  16u daily at home

## 2023-10-14 NOTE — Progress Notes (Addendum)
 Daily Progress Note Intern Pager: [phone]  Patient name: Jonathan [name] Medical record number: [id number] Date of birth: 06-Feb-1951 Age: 73 y.o. Gender: male  Primary Care Provider: Donah Laymon PARAS, MD Consultants: Neurology Code Status: Full  Pt Overview and Major Events to Date:  6/20 admitted  Assessment and Plan:  Jonathan [name] is a 73 y.o. male presenting with right-sided weakness found to have 2.5 cm acute ischemic infarct of left basal ganglia on MRI.   Pertinent PMH/PSH includes left frontal subcortical infarct (09/2020), left basal ganglia/internal capsule infarcts (03/2016) with no significant residual deficit, HTN, HLD, T2DM, former tobacco user, COPD, OSA/central sleep apnea nonadherent to CPAP, right renal mass.  Assessment & Plan Stroke [facility]) MRI brain w/o contrast revealed 2.5 cm acute ischemic infarct of the L basal ganglia, most consistent with stroke.  MR and MRA with/without contrast revealed no brain metastases, but showed severe stenosis of proximal M1 segment of left MCA with moderate stenosis of left PCA.  Echo not performed given recently done 3 months ago.  Symptom onset 6/17. Lipid panel shows LDL above goal of 70. -Neurology following, appreciate recommendations - Continue aspirin  81 mg daily, Lipitor  80 mg daily - Normotensive BP goal given outside of the window for permissive hypertension - Cardiac monitoring for arrhythmia - PT/OT/SLP consult - Continue smoking cessation and risk factor modification - Fall precautions Essential hypertension BP elevated on admission but has since improved.  Has had some soft BP overnight, hold off on restarting home medications (HCTZ, hydralazine , lisinopril , spironolactone ) -Restart meds as appropriate Type 2 diabetes mellitus (HCC) Last A1c 8.8 at the beginning of June. - Resistant SSI and CBG monitoring - Start Semglee  as indicated per CBG and SAI, takes Lantus  16u daily at home Chronic health  problem OSA/Central Sleep Apnea: not adherent to CPAP d/t discomfort HLD: Amlodipine  80 mg daily, may have not been taking this at home Right renal mass: 3 cm right renal mass noted in 03/2020, mass seems stable on repeat imaging 6 months after.  According to last PCP note, patient has not followed up with urology at [facility] and follow-up was recommended. Bradycardia: Longstanding history, asymptomatic at baseline   FEN/GI: Carb modified PPx: Lovenox  Dispo:Pending PT recommendations and neurology recommendations  Subjective:  NAEON, denies concerns this morning.  Talking on phone and eating breakfast  Objective: Temp:  [97.8 F (36.6 C)-98.5 F (36.9 C)] 98.5 F (36.9 C) (06/21 9366) Pulse Rate:  [41-66] 42 (06/21 0615) Resp:  [13-22] 18 (06/21 0615) BP: (103-171)/(43-87) 103/59 (06/21 0615) SpO2:  [95 %-100 %] 99 % (06/21 0615) Weight:  [91.7 kg] 91.7 kg (06/20 1635) Physical Exam: General: NAD sitting up in bed, eating Cardiovascular: RRR no murmur appreciated Respiratory: CTAB normal WOB on RA Abdomen: Soft NTND Extremities: Able to move all extremities Neuro: Right pupil postsurgical.  Otherwise cranial nerves II through XII intact without obvious focal deficit.  Laboratory: Most recent CBC Lab Results  Component Value Date   WBC 7.4 10/13/2023   HGB 13.1 10/13/2023   HCT 39.6 10/13/2023   MCV 91.0 10/13/2023   PLT 272 10/13/2023   Most recent BMP    Latest Ref Rng & Units 10/14/2023    4:10 AM  BMP  Glucose 70 - 99 mg/dL 888   BUN 8 - 23 mg/dL 17   Creatinine 9.38 - 1.24 mg/dL 8.31   Sodium 864 - 854 mmol/L 139   Potassium 3.5 - 5.1 mmol/L 3.8  Chloride 98 - 111 mmol/L 113   CO2 22 - 32 mmol/L 21   Calcium  8.9 - 10.3 mg/dL 8.4     TSH 8.42 Lipid Panel     Component Value Date/Time   CHOL 171 10/14/2023 0410   CHOL 227 (H) 11/14/2022 1434   TRIG 193 (H) 10/14/2023 0410   HDL 21 (L) 10/14/2023 0410   HDL 32 (L) 11/14/2022 1434   CHOLHDL  8.1 10/14/2023 0410   VLDL 39 10/14/2023 0410   LDLCALC 111 (H) 10/14/2023 0410   LDLCALC 150 (H) 11/14/2022 1434   LDLDIRECT 90.0 10/03/2020 0630   LABVLDL 45 (H) 11/14/2022 1434     MR Brain W Contrast MRA Neck W WO Contrast MRA Head WO Contrast:  IMPRESSION: 1. No brain metastases. 2. Severe stenosis of the proximal M1 segment of the left MCA. 3. Moderate stenosis of the left PCA P1-2 junction. 4. No hemodynamically significant stenosis of the internal carotid arteries by NASCET criteria.     Electronically Signed   By: Franky Stanford M.D.   On: 10/14/2023 03:14   Romelle Booty, MD 10/14/2023, 7:17 AM  PGY-2, [facility] FPTS Intern pager: [phone], text pages welcome Secure chat group [facility] Teaching Service

## 2023-10-14 NOTE — Evaluation (Signed)
 Occupational Therapy Evaluation Patient Details Name: Jonathan [name] MRN: [id number] DOB: 03/10/1951 Today's Date: 10/14/2023   History of Present Illness   Pt is a 73 y.o. M presenting to [facility] on 10/13/23 w/ R sided weakness. MRI revealed acute ischemic infarct of L basal ganglia. PMH is significant for left frontal subcortical infarction (09/2020), left basal ganglia/internal capsule infarcts (03/2016) with no significant residual deficit, HTN, HLD, T2DM, former tobacco user, COPD, OSA/central sleep apnea not adherent to CPAP.     Clinical Impressions PTA pt lives independently with his wife and family; drives and does not use an AD for mobility. Pt reports his hand and speech have gotten much better and only a little trouble with his RLE. Pt close to baseline regarding functional ADL tasks with mild deficits noted with gait. No further OT recommended after DC.  Educated pt on signs/symptoms of CVA using Be[employer]. Pt verbalized understanding. OT signing off.      If plan is discharge home, recommend the following:    A Little help with stairs     Functional Status Assessment   Patient has had a recent decline in their functional status and demonstrates the ability to make significant improvements in function in a reasonable and predictable amount of time.     Equipment Recommendations   None recommended by OT     Recommendations for Other Services   PT consult     Precautions/Restrictions   Precautions Precautions: Fall Recall of Precautions/Restrictions: Impaired Precaution/Restrictions Comments: reminded pt to only mobilize with staff Restrictions Weight Bearing Restrictions Per Provider Order: No     Mobility Bed Mobility Overal bed mobility: Modified Independent                  Transfers Overall transfer level: Modified independent                        Balance Overall balance assessment: Needs assistance Sitting-balance support:  No upper extremity supported, Feet supported Sitting balance-Leahy Scale: Good Sitting balance - Comments: sitting EOB   Standing balance support: No upper extremity supported, During functional activity Standing balance-Leahy Scale: Fair Standing balance comment: mild difficulty with clearing R foot at times; no overt tripping                           ADL either performed or assessed with clinical judgement   ADL Overall ADL's : Needs assistance/impaired                                     Functional mobility during ADLs: Modified independent General ADL Comments: overall Mod I with basic ADL tasks; recommend pt discuss return to driving with MD.     Vision Baseline Vision/History: 1 Wears glasses Ability to See in Adequate Light: 1 Impaired Vision Assessment?: Yes;No apparent visual deficits Additional Comments: wears glasses at all times; not in room; no vision changes reported     Perception Perception: Within Functional Limits       Praxis Praxis: WFL       Pertinent Vitals/Pain Pain Assessment Pain Assessment: No/denies pain     Extremity/Trunk Assessment Upper Extremity Assessment Upper Extremity Assessment: Generalized weakness RUE:  (grossly 4-/5) RUE Sensation: WNL RUE Coordination:  (mild incoordination deficits however pt using RUE funcitonally)   Lower Extremity Assessment Lower Extremity Assessment: Defer to PT  evaluation RLE:  (grossly 4-/5 for hip flexion and knee extension. observed R foot drag throughout gait that worsened with fatigue. In supine, pt is able to obtain neutral DF.) RLE Sensation: WNL RLE Coordination: decreased gross motor   Cervical / Trunk Assessment Cervical / Trunk Assessment: Kyphotic   Communication Communication Communication: Impaired Factors Affecting Communication: Reduced clarity of speech (mild)   Cognition Arousal: Alert Behavior During Therapy: WFL for tasks  assessed/performed Cognition: No apparent impairments (slower processing however most likely close to baseline)                               Following commands: Intact       Cueing  General Comments   Cueing Techniques: Verbal cues  no signs of acute distress   Exercises     Shoulder Instructions      Home Living Family/patient expects to be discharged to:: Private residence Living Arrangements: Spouse/significant other Available Help at Discharge: Family;Available PRN/intermittently Type of Home: Mobile home Home Access: Ramped entrance     Home Layout: One level     Bathroom Shower/Tub: Chief Strategy Officer: Handicapped height Bathroom Accessibility: Yes How Accessible: Accessible via walker Home Equipment: Cane - single point          Prior Functioning/Environment Prior Level of Function : Independent/Modified Independent;Working/employed             Mobility Comments: pt reports independent with mobility and was using SPC at one point in time but not recently ADLs Comments: independent    OT Problem List: Impaired balance (sitting and/or standing)   OT Treatment/Interventions:        OT Goals(Current goals can be found in the care plan section)   Acute Rehab OT Goals Patient Stated Goal: home OT Goal Formulation: All assessment and education complete, DC therapy   OT Frequency:       Co-evaluation              AM-PAC OT 6 Clicks Daily Activity     Outcome Measure Help from another person eating meals?: None Help from another person taking care of personal grooming?: None Help from another person toileting, which includes using toliet, bedpan, or urinal?: None Help from another person bathing (including washing, rinsing, drying)?: None Help from another person to put on and taking off regular upper body clothing?: None Help from another person to put on and taking off regular lower body clothing?: None 6  Click Score: 24   End of Session Equipment Utilized During Treatment: Gait belt Nurse Communication: Mobility status  Activity Tolerance: Patient tolerated treatment well Patient left: in bed;with call bell/phone within reach;with nursing/sitter in room  OT Visit Diagnosis: Other abnormalities of gait and mobility (R26.89)                Time: 8674-8658 OT Time Calculation (min): 16 min Charges:  OT General Charges $OT Visit: 1 Visit OT Evaluation $OT Eval Low Complexity: 1 Low  Kreg Sink, OT/L   Acute OT Clinical Specialist Acute Rehabilitation Services Pager [phone] Office [phone]   [facility] 10/14/2023, 1:52 PM

## 2023-10-14 NOTE — Assessment & Plan Note (Addendum)
 MRI brain w/o contrast revealed 2.5 cm acute ischemic infarct of the L basal ganglia, most consistent with stroke.  MR and MRA with/without contrast revealed no brain metastases, but showed severe stenosis of proximal M1 segment of left MCA with moderate stenosis of left PCA.  Echo not performed given recently done 3 months ago.  Symptom onset 6/17. Lipid panel shows LDL above goal of 70. -Neurology following, appreciate recommendations - Continue aspirin  81 mg daily, Lipitor  80 mg daily - Normotensive BP goal given outside of the window for permissive hypertension - Cardiac monitoring for arrhythmia - PT/OT/SLP consult - Continue smoking cessation and risk factor modification - Fall precautions

## 2023-10-14 NOTE — Plan of Care (Addendum)
 FMTS Interim Progress Note  Called wife Mrs. Glenford Garis to provide update Discussed plan for today She does prefer that he stays today to get some rest and work with therapy No further questions/concerns  Romelle Booty, MD 10/14/2023, 11:49 AM PGY-2, [facility] Family [facility] Service pager [phone]

## 2023-10-14 NOTE — Assessment & Plan Note (Signed)
 MRI brain w/o contrast revealed 2.5 cm acute ischemic infarct of the L basal ganglia, most consistent with stroke.  - Admit to FMTS, attending Dr. Donzetta - Med-Tele, Vital signs per floor - Carb Modified diet, passed swallow screen - VTE prophylaxis: Lovenox  - Imaging: MRI brain w/ contrast and MRA head and neck w/o contrast pending; Echo deferred since recently performed in 06/2023 - Previously on Plavix , but will rule out brain tumor with MRI prior to reinitiating- Labs: Lipid panel, TSH, BMP,  A1c (deferred, 8.8 on 09/25/23) - Continue ASA 81 mg and hold Plavix  75 mg until MRI brain w/ contrast to rule out need for biopsy or procedures - Normotensive BP goal, since symptom onset was 6/17 - Cardiac monitoring for arrhythmia - PT/OT/SLP consult - Continue smoking cessation and risk factor modification - Neurology consulted, appreciate recommendations - Fall precautions

## 2023-10-14 NOTE — Evaluation (Signed)
**Note Jonathan-Identified via Obfuscation**  Speech Language Pathology Evaluation Patient Details Name: DEMONE [name] MRN: [id number] DOB: [birth year] Today's Date: 10/14/2023 Time: 1000-1014 SLP Time Calculation (min) (ACUTE ONLY): 14 min  Problem List:  Patient Active Problem List   Diagnosis Date Noted   Chronic health problem 10/14/2023   Stroke (HCC) 10/13/2023   Routine adult health maintenance 08/30/2023   Rash and nonspecific skin eruption 12/28/2021   Liver mass 10/13/2020   Kidney mass 05/19/2020   Aortic atherosclerosis (HCC) 03/28/2020   Complex sleep apnea syndrome 01/23/2019   Treatment-emergent central sleep apnea 01/23/2019   Severe obstructive sleep apnea-hypopnea syndrome 01/23/2019   OSA and COPD overlap syndrome (HCC) 01/23/2019   History of stroke 04/04/2016   Type 2 diabetes mellitus (HCC) 04/01/2016   Epigastric hernia 12/01/2015   Allergic rhinitis 10/31/2012   HLD (hyperlipidemia) 09/07/2011   OSA (obstructive sleep apnea) 06/17/2011   Hearing loss 01/17/2011   H/O: asbestos exposure 11/18/2010   Inguinal hernia, right 11/07/2010   Essential hypertension 08/17/2010   Bradycardia 08/17/2010   Tobacco use disorder 08/17/2010   Past Medical History:  Past Medical History:  Diagnosis Date   Anxiety    Arthritis    COPD (chronic obstructive pulmonary disease) (HCC)    Depression    Diabetes mellitus without complication (HCC)    Diarrhea    History of kidney stones    HTN (hypertension)    Hyperlipidemia    Hypertensive retinopathy    OU   Pneumonia    Retinal detachment    Rheg. RD OD   Routine adult health maintenance 08/30/2023   Sleep apnea    does not use cpap   Stroke [facility])    denies any deficits   Past Surgical History:  Past Surgical History:  Procedure Laterality Date   CATARACT EXTRACTION Bilateral    Dr. Roz [place]     EYE SURGERY Bilateral    Cat Sx OU; RD repair OD   GAS INSERTION Right 03/13/2019   Procedure: INSERTION OF GAS (C3F8) RIGHT EYE;  Surgeon:  Valdemar Rogue, MD;  Location: [facility] OR;  Service: Ophthalmology;  Laterality: Right;   LAPAROSCOPIC APPENDECTOMY N/A 10/03/2020   Procedure: APPENDECTOMY LAPAROSCOPIC;  Surgeon: Rubin Calamity, MD;  Location: [facility] OR;  Service: General;  Laterality: N/A;   LASER PHOTO ABLATION Right 03/13/2019   Procedure: LASER PHOTO  ABLATION RIGHT EYE;  Surgeon: Valdemar Rogue, MD;  Location: [facility] OR;  Service: Ophthalmology;  Laterality: Right;   none     PARS PLANA VITRECTOMY Right 03/13/2019   Procedure: 25 GAUGE PARS PLANA VITRECTOMY WITH  INTRAOCULAR LENSE EXPLANTATION RIGHT EYE.;  Surgeon: Valdemar Rogue, MD;  Location: [facility] OR;  Service: Ophthalmology;  Laterality: Right;   PARS PLANA VITRECTOMY Right 06/27/2019   Procedure: PARS PLANA VITRECTOMY WITH 25 GAUGE;  Surgeon: Valdemar Rogue, MD;  Location: [facility] OR;  Service: Ophthalmology;  Laterality: Right;   PHOTOCOAGULATION WITH LASER Left 03/13/2019   Procedure: INDIRECT PHOTOCOAGULATION WITH LASER LEFT EYE;  Surgeon: Valdemar Rogue, MD;  Location: [facility] OR;  Service: Ophthalmology;  Laterality: Left;   PLACEMENT AND SUTURE OF SECONDARY INTRAOCULAR LENS Right 06/27/2019   Procedure: PLACEMENT AND SUTURE OF SECONDARY INTRAOCULAR LENS;  Surgeon: Valdemar Rogue, MD;  Location: [facility] OR;  Service: Ophthalmology;  Laterality: Right;   RETINAL DETACHMENT SURGERY Right 03/13/2019   PPV for repair of rheg. RD - Dr. Rogue Valdemar   HPI:  Jonathan [name] is a 73 y.o. male presenting with right-sided weakness found to have  2.5 cm acute ischemic infarct of left basal ganglia on MRI. Pt reports some slurred speech at the start of symptoms and very slight persistence of dysarthric speech. Pertinent PMH/PSH includes left frontal subcortical infarct (09/2020), left basal ganglia/internal capsule infarcts (03/2016) with no significant residual deficit, HTN, HLD, T2DM, former tobacco user, COPD, OSA/central sleep apnea nonadherent to CPAP, right renal mass.   Assessment / Plan /  Recommendation Clinical Impression  Pt seen for skilled ST services speech and language assessment. The pt presents with a very mild unspecified dysarthria with 100% intelligibility in spontaneous conversation. The pt was A&Ox4 and reports no changes to his cognition and an almost full return to articulation baseline. He was given elements of the [place]  Aphasia Screening Test (MAST) and elements of the [facility] Dysarthria Assessment Tool (N-DAT). While describing the cookie theft picture, his spontaneous speech was smooth, connected, and intelligible with slightly slurred speech. Overall, his respiration, phonation, prosody, and loudness were all WFL. The pt completed rate of movement task within "good" norms. The pt had noted difficulty with sequential motion rates (SMRs) and alternating motion rate (AMR) tasks, with groping behaviors and irregular precision scoring below the norm rate for diadochokinetic (DDK) rates. The pt had slightly overly aspirated plosive sounds (ie /d/ and /t/ and /k/) at the word level. The pt was educated on dysarthria and the role of ST in skilled rehab services. The pt verbally acknowledged understanding and was agreeable to ST while admitted at [facility] for dysarthria tx.    SLP Assessment  SLP Recommendation/Assessment: Patient needs continued Speech Language Pathology Services SLP Visit Diagnosis: Dysarthria and anarthria (R47.1)     Assistance Recommended at Discharge  None  Functional Status Assessment Patient has had a recent decline in their functional status and demonstrates the ability to make significant improvements in function in a reasonable and predictable amount of time.  Frequency and Duration min 1 x/week  1 week      SLP Evaluation Cognition  Orientation Level: Oriented X4       Comprehension  Auditory Comprehension Overall Auditory Comprehension: Appears within functional limits for tasks assessed Yes/No Questions: Within Functional  Limits Commands: Within Functional Limits Conversation: Complex    Expression Expression Primary Mode of Expression: Verbal Verbal Expression Overall Verbal Expression: Appears within functional limits for tasks assessed Initiation: No impairment Automatic Speech: Name;Social Response;Day of week;Counting Level of Generative/Spontaneous Verbalization: Conversation Repetition: No impairment Naming: No impairment Pragmatics: No impairment   Oral / Motor  Oral Motor/Sensory Function Overall Oral Motor/Sensory Function: Within functional limits Motor Speech Overall Motor Speech: Appears within functional limits for tasks assessed Respiration: Within functional limits Phonation: Normal Resonance: Within functional limits Articulation: Impaired Level of Impairment: Sentence Intelligibility: Intelligible Motor Planning: Impaired Level of Impairment: Word Motor Speech Errors: Inconsistent;Groping for words;Aware Effective Techniques: Slow rate           Manuelita Blew M.S. CCC-SLP

## 2023-10-14 NOTE — Assessment & Plan Note (Signed)
 Last A1c 8.8 at the beginning of June. - Resistant SSI and CBG monitoring - Start Semglee  as indicated per CBG and SAI, takes Lanuts 16u daily at home

## 2023-10-15 DIAGNOSIS — I639 Cerebral infarction, unspecified: Secondary | ICD-10-CM | POA: Diagnosis not present

## 2023-10-15 DIAGNOSIS — I6602 Occlusion and stenosis of left middle cerebral artery: Secondary | ICD-10-CM | POA: Diagnosis not present

## 2023-10-15 DIAGNOSIS — E1165 Type 2 diabetes mellitus with hyperglycemia: Secondary | ICD-10-CM | POA: Diagnosis not present

## 2023-10-15 DIAGNOSIS — I779 Disorder of arteries and arterioles, unspecified: Secondary | ICD-10-CM | POA: Diagnosis not present

## 2023-10-15 LAB — BASIC METABOLIC PANEL WITH GFR
Anion gap: 5 (ref 5–15)
BUN: 18 mg/dL (ref 8–23)
CO2: 18 mmol/L — ABNORMAL LOW (ref 22–32)
Calcium: 8 mg/dL — ABNORMAL LOW (ref 8.9–10.3)
Chloride: 113 mmol/L — ABNORMAL HIGH (ref 98–111)
Creatinine, Ser: 1.7 mg/dL — ABNORMAL HIGH (ref 0.61–1.24)
GFR, Estimated: 42 mL/min — ABNORMAL LOW (ref >60)
Glucose, Bld: 107 mg/dL — ABNORMAL HIGH (ref 70–99)
Potassium: 3.8 mmol/L (ref 3.5–5.1)
Sodium: 136 mmol/L (ref 135–145)

## 2023-10-15 LAB — GLUCOSE, CAPILLARY
Glucose-Capillary: 112 mg/dL — ABNORMAL HIGH (ref 70–99)
Glucose-Capillary: 171 mg/dL — ABNORMAL HIGH (ref 70–99)

## 2023-10-15 LAB — PLATELET INHIBITION P2Y12: Platelet Function  P2Y12: 220 [PRU] (ref 182–335)

## 2023-10-15 MED ORDER — CILOSTAZOL 100 MG PO TABS
100.0000 mg | ORAL_TABLET | Freq: Two times a day (BID) | ORAL | Status: DC
  Filled 2023-10-15: qty 1

## 2023-10-15 MED ORDER — CILOSTAZOL 100 MG PO TABS: 100.0000 mg | ORAL_TABLET | Freq: Two times a day (BID) | ORAL | 0 refills | Status: DC

## 2023-10-15 MED ORDER — ATORVASTATIN CALCIUM 80 MG PO TABS: 80.0000 mg | ORAL_TABLET | Freq: Every day | ORAL | 0 refills | Status: DC

## 2023-10-15 NOTE — Progress Notes (Signed)
 DISCHARGE NOTE HOME Jonathan [name] to be discharged Home per MD order. Discussed prescriptions and follow up appointments with the patient. Prescriptions given to patient; medication list explained in detail. Patient verbalized understanding.  Skin clean, dry and intact without evidence of skin break down, no evidence of skin tears noted. IV catheter discontinued intact. Site without signs and symptoms of complications. Dressing and pressure applied. Pt denies pain at the site currently. No complaints noted.  Patient free of lines, drains, and wounds.   An After Visit Summary (AVS) was printed and given to the patient. Patient escorted via wheelchair, and discharged home via private auto.  [name], RN

## 2023-10-15 NOTE — Assessment & Plan Note (Addendum)
 Neurologic deficits continue to improve today.  P2Y12 platelet inhibition to 220 indicating patient is a nonresponder to Plavix , discussed with Dr. Jerri, patient switching to Pletal. -Neurology following, appreciate recommendations - Continue aspirin  81 mg daily, stop Plavix , Lipitor  80 mg daily - Start Pletal 100 mg twice daily - Blood pressure control as below - Cardiac monitoring for arrhythmia - PT/OT/SLP consult - Continue smoking cessation and risk factor modification - Fall precautions

## 2023-10-15 NOTE — TOC CM/SW Note (Signed)
 PT is recommending outpt PT. Met with pt to discuss PT recommendations. Pt agrees with outpt rehab at [facility]. Referral sent to neuro rehab. He reports his daughter can take him to the rehab appointments. Daughter to provide transportation home.

## 2023-10-15 NOTE — Discharge Summary (Signed)
 Family Medicine Teaching [facility] Discharge Summary  Patient name: Jonathan [name] Medical record number: [id number] Date of birth: 06/18/1950 Age: 73 y.o. Gender: male Date of Admission: 10/13/2023  Date of Discharge: 10/15/2023 Admitting Physician: Kathrine Melena, DO  Primary Care Provider: Donah Laymon PARAS, MD Consultants: Neurology  Indication for Hospitalization: Stroke  Discharge Diagnoses/Problem List:  Principal Problem for Admission: Stroke Other Problems addressed during stay:  Principal Problem:   Stroke [facility]) Active Problems:   Essential hypertension   Type 2 diabetes mellitus (HCC)   Chronic health problem    Brief Hospital Course:  CASSANDRA MCMANAMAN is a 73 y.o.male with a history of HTN, T2DM, OSA, Stroke, HLD,  who was admitted to the [facility] Teaching Service at [facility] for stroke. His hospital course is detailed below:  Stroke Patient last well known 6/16, endorses having right-sided numbness and slurring his words since 6/17. Patient is persistently bradycardic and hypertensive in the ED MRI brain without contrast revealed 2.5 cm acute ischemia in the left basal ganglia with no hemorrhage or mass effect.  Neurology was consulted and recommended further imaging with MRI brain with contrast and carotid duplex to rule out metastases from his renal mass.  Imaging revealed no metastases, severe stenosis of prox M1 of L MCA, and moderate stenosis of L PCA.  DAPT was initially restarted with additional platelet inhibition P2Y12 test.  Indicated patient is a nonresponder to Plavix .  Neurology transition patient from Plavix  to Pletal 100 mg twice daily.  Patient was continued on aspirin . Restarted Lipitor  80 mg. Discharged in stable condition with return to baseline neurologic exam.      Other chronic conditions were medically managed with home medications and formulary alternatives as necessary (HTN, T2DM, OSA/Central Sleep Apnea)  PCP Follow-up  Recommendations: Sure patient follow-up with neurology, continue aspirin  and Pletal until recommended otherwise per neurology. Optimize blood pressure regimen - continued on lisinopril  40 mg and hydrochlorothiazide  at discharge, home hydralazine  and spironolactone  held.   Ensure follow up with urology regarding renal mass  Disposition: Home with outpatient PT  Discharge Condition: stable  Discharge Exam:  Vitals:   10/15/23 0743 10/15/23 1203  BP: (!) 157/79 (!) 146/79  Pulse: (!) 47 (!) 57  Resp: 16 16  Temp: 97.7 F (36.5 C) 97.7 F (36.5 C)  SpO2: 97% 98%   General: NAD, sitting up comfortably in hospital bed eating breakfast Neuro: A&O, cranial nerves II through XII intact, strength 5 out of 5 bilateral upper and lower extremities in all major muscle groups Cardiovascular: RRR, no murmurs, no peripheral edema Respiratory: normal WOB on room air, CTAB, no wheezes, ronchi or rales Extremities: Moving all 4 extremities equally  Significant Procedures: None  Significant Labs and Imaging:  Recent Labs  Lab 10/13/23 1633  WBC 7.4  HGB 13.1  HCT 39.6  PLT 272   Recent Labs  Lab 10/13/23 1633 10/14/23 0410 10/15/23 0559  NA 139 139 136  K 3.6 3.8 3.8  CL 112* 113* 113*  CO2 20* 21* 18*  GLUCOSE 145* 111* 107*  BUN 19 17 18   CREATININE 1.71* 1.68* 1.70*  CALCIUM  8.4* 8.4* 8.0*  ALKPHOS 69  --   --   AST 21  --   --   ALT 24  --   --   ALBUMIN 3.5  --   --    MRI brain with contrast, MRI neck with and without contrast 1. No brain metastases. 2. Severe stenosis of the proximal  M1 segment of the left MCA. 3. Moderate stenosis of the left PCA P1-2 junction. 4. No hemodynamically significant stenosis of the internal carotid arteries by NASCET criteria.  Results/Tests Pending at Time of Discharge: None  Discharge Medications:  Allergies as of 10/15/2023   No Known Allergies      Medication List     PAUSE taking these medications    hydrALAZINE  25 MG  tablet Wait to take this until your doctor or other care provider tells you to start again. Commonly known as: APRESOLINE  Take 1 tablet (25 mg total) by mouth 3 (three) times daily.   spironolactone  50 MG tablet Wait to take this until your doctor or other care provider tells you to start again. Commonly known as: Aldactone  Take 1 tablet (50 mg total) by mouth daily.   tamsulosin  0.4 MG Caps capsule Wait to take this until your doctor or other care provider tells you to start again. Commonly known as: FLOMAX  Take 1 capsule (0.4 mg total) by mouth daily.       STOP taking these medications    clopidogrel  75 MG tablet Commonly known as: PLAVIX        TAKE these medications    3 Series BP Monitor/Upper Arm Devi Check blood pressure once a day   albuterol  108 (90 Base) MCG/ACT inhaler Commonly known as: VENTOLIN  HFA Inhale 2 puffs into the lungs every 6 (six) hours as needed. What changed: reasons to take this   aspirin  EC 81 MG tablet Take 1 tablet (81 mg total) by mouth daily. Swallow whole.   atorvastatin  80 MG tablet Commonly known as: LIPITOR  Take 1 tablet (80 mg total) by mouth daily. Start taking on: October 16, 2023   buPROPion  300 MG 24 hr tablet Commonly known as: Wellbutrin  XL Take 1 tablet (300 mg total) by mouth every morning.   cetirizine  10 MG tablet Commonly known as: ZYRTEC  Take 1 tablet (10 mg total) by mouth daily.   cilostazol 100 MG tablet Commonly known as: PLETAL Take 1 tablet (100 mg total) by mouth 2 (two) times daily.   fluticasone  50 MCG/ACT nasal spray Commonly known as: FLONASE  Place 1 spray into both nostrils daily.   hydrochlorothiazide  25 MG tablet Commonly known as: HYDRODIURIL  Take 1 tablet (25 mg total) by mouth daily.   insulin  glargine 100 unit/mL Sopn Commonly known as: LANTUS  Inject 18 Units into the skin daily. What changed: how much to take   ipratropium 0.03 % nasal spray Commonly known as: ATROVENT  Place 2  sprays into both nostrils every 12 (twelve) hours.   lisinopril  40 MG tablet Commonly known as: ZESTRIL  Take 1 tablet (40 mg total) by mouth daily.   ONE TOUCH DELICA LANCING DEV Misc Use to check blood sugar 3 times daily   OneTouch Delica Lancets 33G Misc Use to check blood sugar 3 times daily   OneTouch Verio test strip Generic drug: glucose blood Use to check blood sugar 3 times daily   OneTouch Verio w/Device Kit Use to check blood sugar 3 times daily   Shingrix  injection Generic drug: Zoster Vaccine Adjuvanted Administer Shingrix  vaccination now and repeat in two months        Discharge Instructions: Please refer to Patient Instructions section of EMR for full details.  Patient was counseled important signs and symptoms that should prompt return to medical care, changes in medications, dietary instructions, activity restrictions, and follow up appointments.   Follow-Up Appointments:  Follow-up Information     [facility]. Schedule an appointment as soon as possible for a visit in 1 month(s).   Specialty: Neurology Why: stroke clinic Contact information: [address] [place]  [address] [phone]               Future Appointments  Date Time Provider Department Center  10/18/2023 11:15 AM Nicholas Bar, MD [facility]  10/20/2023  8:40 AM [place], PA-C [facility]  10/31/2023 11:30 AM Donah Laymon PARAS, MD [facility]  11/27/2023  9:50 AM [facility] ANNUAL WELLNESS VISIT [facility]     Alba Sharper, MD 10/15/2023, 12:29 PM PGY-2, [facility]

## 2023-10-15 NOTE — Assessment & Plan Note (Signed)
 OSA/Central Sleep Apnea: not adherent to CPAP d/t discomfort HLD: Amlodipine  80 mg daily, may have not been taking this at home Right renal mass: 3 cm right renal mass noted in 03/2020, mass seems stable on repeat imaging 6 months after.  According to last PCP note, patient has not followed up with urology at [facility] and follow-up was recommended. Bradycardia: Longstanding history, asymptomatic at baseline CKD- creatinine stable around baseline ~1.6

## 2023-10-15 NOTE — Progress Notes (Signed)
 STROKE TEAM PROGRESS NOTE   SUBJECTIVE (INTERVAL HISTORY) No family is at the bedside.  Pt is sitting in chair, neuro stable. No complains. Had P2Y12 check today 220, likely non-responder to plavix . Will change to pletal.    OBJECTIVE Temp:  [97.7 F (36.5 C)-98.3 F (36.8 C)] 97.7 F (36.5 C) (06/22 0743) Pulse Rate:  [43-57] 47 (06/22 0743) Cardiac Rhythm: Sinus bradycardia (06/22 0827) Resp:  [15-19] 16 (06/22 0743) BP: (140-168)/(64-79) 157/79 (06/22 0743) SpO2:  [96 %-99 %] 97 % (06/22 0743)  Recent Labs  Lab 10/14/23 0755 10/14/23 1244 10/14/23 1610 10/14/23 2114 10/15/23 0618  GLUCAP 106* 121* 164* 148* 112*   Recent Labs  Lab 10/13/23 1633 10/14/23 0410 10/15/23 0559  NA 139 139 136  K 3.6 3.8 3.8  CL 112* 113* 113*  CO2 20* 21* 18*  GLUCOSE 145* 111* 107*  BUN 19 17 18   CREATININE 1.71* 1.68* 1.70*  CALCIUM  8.4* 8.4* 8.0*   Recent Labs  Lab 10/13/23 1633  AST 21  ALT 24  ALKPHOS 69  BILITOT 0.5  PROT 6.6  ALBUMIN 3.5   Recent Labs  Lab 10/13/23 1633  WBC 7.4  NEUTROABS 4.2  HGB 13.1  HCT 39.6  MCV 91.0  PLT 272   No results for input(s): CKTOTAL, CKMB, CKMBINDEX, TROPONINI in the last 168 hours. Recent Labs    10/13/23 1633  LABPROT 13.3  INR 1.0   Recent Labs    10/13/23 2040  COLORURINE STRAW*  LABSPEC 1.005  PHURINE 7.0  GLUCOSEU NEGATIVE  HGBUR SMALL*  BILIRUBINUR NEGATIVE  KETONESUR NEGATIVE  PROTEINUR NEGATIVE  NITRITE NEGATIVE  LEUKOCYTESUR NEGATIVE       Component Value Date/Time   CHOL 171 10/14/2023 0410   CHOL 227 (H) 11/14/2022 1434   TRIG 193 (H) 10/14/2023 0410   HDL 21 (L) 10/14/2023 0410   HDL 32 (L) 11/14/2022 1434   CHOLHDL 8.1 10/14/2023 0410   VLDL 39 10/14/2023 0410   LDLCALC 111 (H) 10/14/2023 0410   LDLCALC 150 (H) 11/14/2022 1434   Lab Results  Component Value Date   HGBA1C 8.8 (A) 09/25/2023      Component Value Date/Time   LABOPIA NONE DETECTED 10/13/2023 2040   COCAINSCRNUR  NONE DETECTED 10/13/2023 2040   LABBENZ NONE DETECTED 10/13/2023 2040   AMPHETMU NONE DETECTED 10/13/2023 2040   THCU NONE DETECTED 10/13/2023 2040   LABBARB NONE DETECTED 10/13/2023 2040    Recent Labs  Lab 10/13/23 1652  ETH <15    I have personally reviewed the radiological images below and agree with the radiology interpretations.  MR ANGIO HEAD WO CONTRAST Result Date: 10/14/2023 CLINICAL DATA:  Renal mass EXAM: MRI HEAD WITH CONTRAST MRA HEAD WITHOUT CONTRAST MRA NECK WITHOUT AND WITH CONTRAST TECHNIQUE: Multiplanar, multiecho pulse sequences of the brain and surrounding structures were obtained with intravenous contrast. Angiographic images of the Circle of Willis were obtained using MRA technique without intravenous contrast. Angiographic images of the neck were obtained using MRA technique without and with intravenous contrast. Carotid stenosis measurements (when applicable) are obtained utilizing NASCET criteria, using the distal internal carotid diameter as the denominator. CONTRAST:  9mL GADAVIST  GADOBUTROL  1 MMOL/ML IV SOLN COMPARISON:  10/03/2020 FINDINGS: MRI HEAD FINDINGS Postcontrast imaging of the brain was performed as an adjunct to the earlier noncontrast brain MRI. There is no abnormal contrast enhancement. MRA HEAD FINDINGS POSTERIOR CIRCULATION: Vertebral arteries are normal. No proximal occlusion of the anterior or inferior cerebellar arteries. Basilar artery is  normal. Superior cerebellar arteries are normal. Moderate stenosis of the left PCA P1-2 junction. Normal right PCA. ANTERIOR CIRCULATION: Intracranial internal carotid arteries are normal. Anterior cerebral arteries are normal. Severe stenosis of the proximal M1 segment of the left MCA (new since 10/03/2020). Right MCA is normal. Anatomic Variants: None MRA NECK FINDINGS There is antegrade flow within both carotid systems in the left dominant vertebral system. There is narrowing with less than 50% stenosis of the  proximal left ICA, which is widely patent distally. No hemodynamically significant stenosis by NASCET criteria of the right ICA IMPRESSION: 1. No brain metastases. 2. Severe stenosis of the proximal M1 segment of the left MCA. 3. Moderate stenosis of the left PCA P1-2 junction. 4. No hemodynamically significant stenosis of the internal carotid arteries by NASCET criteria. Electronically Signed   By: Franky Stanford M.D.   On: 10/14/2023 03:14   MR ANGIO NECK W WO CONTRAST Result Date: 10/14/2023 CLINICAL DATA:  Renal mass EXAM: MRI HEAD WITH CONTRAST MRA HEAD WITHOUT CONTRAST MRA NECK WITHOUT AND WITH CONTRAST TECHNIQUE: Multiplanar, multiecho pulse sequences of the brain and surrounding structures were obtained with intravenous contrast. Angiographic images of the Circle of Willis were obtained using MRA technique without intravenous contrast. Angiographic images of the neck were obtained using MRA technique without and with intravenous contrast. Carotid stenosis measurements (when applicable) are obtained utilizing NASCET criteria, using the distal internal carotid diameter as the denominator. CONTRAST:  9mL GADAVIST  GADOBUTROL  1 MMOL/ML IV SOLN COMPARISON:  10/03/2020 FINDINGS: MRI HEAD FINDINGS Postcontrast imaging of the brain was performed as an adjunct to the earlier noncontrast brain MRI. There is no abnormal contrast enhancement. MRA HEAD FINDINGS POSTERIOR CIRCULATION: Vertebral arteries are normal. No proximal occlusion of the anterior or inferior cerebellar arteries. Basilar artery is normal. Superior cerebellar arteries are normal. Moderate stenosis of the left PCA P1-2 junction. Normal right PCA. ANTERIOR CIRCULATION: Intracranial internal carotid arteries are normal. Anterior cerebral arteries are normal. Severe stenosis of the proximal M1 segment of the left MCA (new since 10/03/2020). Right MCA is normal. Anatomic Variants: None MRA NECK FINDINGS There is antegrade flow within both carotid systems  in the left dominant vertebral system. There is narrowing with less than 50% stenosis of the proximal left ICA, which is widely patent distally. No hemodynamically significant stenosis by NASCET criteria of the right ICA IMPRESSION: 1. No brain metastases. 2. Severe stenosis of the proximal M1 segment of the left MCA. 3. Moderate stenosis of the left PCA P1-2 junction. 4. No hemodynamically significant stenosis of the internal carotid arteries by NASCET criteria. Electronically Signed   By: Franky Stanford M.D.   On: 10/14/2023 03:14   MR BRAIN W CONTRAST Result Date: 10/14/2023 CLINICAL DATA:  Renal mass EXAM: MRI HEAD WITH CONTRAST MRA HEAD WITHOUT CONTRAST MRA NECK WITHOUT AND WITH CONTRAST TECHNIQUE: Multiplanar, multiecho pulse sequences of the brain and surrounding structures were obtained with intravenous contrast. Angiographic images of the Circle of Willis were obtained using MRA technique without intravenous contrast. Angiographic images of the neck were obtained using MRA technique without and with intravenous contrast. Carotid stenosis measurements (when applicable) are obtained utilizing NASCET criteria, using the distal internal carotid diameter as the denominator. CONTRAST:  9mL GADAVIST  GADOBUTROL  1 MMOL/ML IV SOLN COMPARISON:  10/03/2020 FINDINGS: MRI HEAD FINDINGS Postcontrast imaging of the brain was performed as an adjunct to the earlier noncontrast brain MRI. There is no abnormal contrast enhancement. MRA HEAD FINDINGS POSTERIOR CIRCULATION: Vertebral arteries are normal. No  proximal occlusion of the anterior or inferior cerebellar arteries. Basilar artery is normal. Superior cerebellar arteries are normal. Moderate stenosis of the left PCA P1-2 junction. Normal right PCA. ANTERIOR CIRCULATION: Intracranial internal carotid arteries are normal. Anterior cerebral arteries are normal. Severe stenosis of the proximal M1 segment of the left MCA (new since 10/03/2020). Right MCA is normal. Anatomic  Variants: None MRA NECK FINDINGS There is antegrade flow within both carotid systems in the left dominant vertebral system. There is narrowing with less than 50% stenosis of the proximal left ICA, which is widely patent distally. No hemodynamically significant stenosis by NASCET criteria of the right ICA IMPRESSION: 1. No brain metastases. 2. Severe stenosis of the proximal M1 segment of the left MCA. 3. Moderate stenosis of the left PCA P1-2 junction. 4. No hemodynamically significant stenosis of the internal carotid arteries by NASCET criteria. Electronically Signed   By: Franky Stanford M.D.   On: 10/14/2023 03:14   MR BRAIN WO CONTRAST Result Date: 10/13/2023 CLINICAL DATA:  Acute neurologic deficit EXAM: MRI HEAD WITHOUT CONTRAST TECHNIQUE: Multiplanar, multiecho pulse sequences of the brain and surrounding structures were obtained without intravenous contrast. COMPARISON:  10/03/2020 FINDINGS: Brain: There is a 2.5 cm area of acute ischemia at the left basal ganglia. No acute or chronic hemorrhage. There is multifocal hyperintense T2-weighted signal within the white matter. Parenchymal volume and CSF spaces are normal. The midline structures are normal. Vascular: Normal flow voids. Skull and upper cervical spine: Normal calvarium and skull base. Visualized upper cervical spine and soft tissues are normal. Sinuses/Orbits:Right mastoid effusion. Paranasal sinuses are unremarkable. Normal orbits. IMPRESSION: A 2.5 cm area of acute ischemia at the left basal ganglia. No hemorrhage or mass effect. Electronically Signed   By: Franky Stanford M.D.   On: 10/13/2023 20:56   DG Chest Portable 1 View Result Date: 10/13/2023 CLINICAL DATA:  Weakness. EXAM: PORTABLE CHEST 1 VIEW COMPARISON:  October 02, 2020 FINDINGS: The heart size and mediastinal contours are within normal limits. There is no evidence of acute infiltrate, pleural effusion or pneumothorax. The visualized skeletal structures are unremarkable. IMPRESSION: No  active disease. Electronically Signed   By: Suzen Dials M.D.   On: 10/13/2023 19:30   CT HEAD WO CONTRAST Result Date: 10/13/2023 CLINICAL DATA:  Mental status change EXAM: CT HEAD WITHOUT CONTRAST TECHNIQUE: Contiguous axial images were obtained from the base of the skull through the vertex without intravenous contrast. RADIATION DOSE REDUCTION: This exam was performed according to the departmental dose-optimization program which includes automated exposure control, adjustment of the mA and/or kV according to patient size and/or use of iterative reconstruction technique. COMPARISON:  MRI/MRA head 10/03/2020, MRI head 06/03/2017 and 04/03/2016. FINDINGS: Brain: No acute intracranial hemorrhage. New hypoattenuation in the left basal ganglia involving the caudate head and anterior limb of the internal capsule with extension into the lentiform nucleus. Additional remote lacunar infarcts in the left basal ganglia and right thalamus. Nonspecific hypoattenuation in the periventricular and subcortical white matter favored to reflect chronic microvascular ischemic changes. Mild parenchymal volume loss. No midline shift. Redemonstrated 8 mm mass at the right aspect of the foramen magnum adjacent to the right vertebral artery better characterized on prior MRIs. Basilar cisterns are patent. Ventricles: The ventricles are normal. Vascular: No hyperdense vessel or unexpected calcification. Skull: No acute or aggressive finding. Orbits: Orbits are symmetric. Sinuses: Mucosal thickening throughout the paranasal sinuses particularly in the ethmoid sinuses. Secretions in the left sphenoid sinus. Mucous retention cyst involving the right  frontal sinus outflow tract. Other: Large right mastoid effusion. IMPRESSION: Hypoattenuation in the left basal ganglia as described above concerning for acute versus subacute infarct. Recommend MRI brain for further evaluation. No acute intracranial hemorrhage. Remote infarcts in the left  basal ganglia and right thalamus. Chronic microvascular ischemic changes. Similar appearance of mass at the right aspect of the foramen magnum, stable since 2017. Electronically Signed   By: Donnice Mania M.D.   On: 10/13/2023 17:36     PHYSICAL EXAM  Temp:  [97.7 F (36.5 C)-98.3 F (36.8 C)] 97.7 F (36.5 C) (06/22 0743) Pulse Rate:  [43-57] 47 (06/22 0743) Resp:  [15-19] 16 (06/22 0743) BP: (140-168)/(64-79) 157/79 (06/22 0743) SpO2:  [96 %-99 %] 97 % (06/22 0743)  General - Well nourished, well developed, in no apparent distress.  Ophthalmologic - fundi not visualized due to noncooperation.  Cardiovascular - Regular rhythm and rate.  Mental Status -  Level of arousal and orientation to time, place, and person were intact. Language including expression, naming, repetition, comprehension was assessed and found intact. Fund of Knowledge was assessed and was intact.  Cranial Nerves II - XII - II - Visual field intact OU. III, IV, VI - Extraocular movements intact. V - Facial sensation intact bilaterally. VII - Facial movement intact bilaterally. VIII - Hearing & vestibular intact bilaterally. X - Palate elevates symmetrically. XI - Chin turning & shoulder shrug intact bilaterally. XII - Tongue protrusion intact.  Motor Strength - The patient's strength was normal in all extremities and pronator drift was absent.  Bulk was normal and fasciculations were absent.   Motor Tone - Muscle tone was assessed at the neck and appendages and was normal.  Reflexes - The patient's reflexes were symmetrical in all extremities and he had no pathological reflexes.  Sensory - Light touch, temperature/pinprick were assessed and were symmetrical.    Coordination - The patient had normal movements in the hands with no ataxia or dysmetria.  Tremor was absent.  Gait and Station - deferred.   ASSESSMENT/PLAN Jonathan [name] is a 73 y.o. male with history of hypertension, hyperlipidemia,  diabetes, former smoker, OSA not compliant with CPAP, right renal mass, strokes admitted for right-sided weakness and slurred speech. No TNK given due to outside window.    Stroke:  left BG infarct likely secondary to large vessel disease source from left M1 severe stenosis CT possible left BG infarct, old left BG and right thalamus infarcts MRI left BG infarct MRA head and neck left M1 severe stenosis, left P1/P2 moderate stenosis Carotid Doppler unremarkable 08/2023 2D Echo 60 to 65% in 06/2023 LDL 111 HgbA1c 8.8 UDS negative P2 Y12 = 220, likely plavix  non-responder Lovenox  for VTE prophylaxis aspirin  81 mg daily and clopidogrel  75 mg daily prior to admission, now on ASA 81 and pletal DAPT, continue on discharge.  Patient counseled to be compliant with his antithrombotic medications Ongoing aggressive stroke risk factor management Therapy recommendations:  outpt PT Disposition: likely home today  History of stroke 03/2016 admitted for slurred speech for 2 days. MRI showed left BG/IC infarct. MRA left PCA mild to moderate stenosis. EF 60 to 65%. Carotid Doppler right ICA 40 to 59% stenosis. LDL 92, A1c 6.5. Patient aspirin  changed to Plavix  and added Lipitor  40 on discharge.  09/2020 admitted for left frontal subcortical infarct.  MRI showed left ACA and PCA severe stenosis.  Carotid Doppler showed right ICA 40 to 59% stenosis.  EF 55 to 60%, LDL 90, A1c  7.1.  Discharged on DAPT for 3 months as well as Lipitor  80  Diabetes, uncontrolled HgbA1c 8.8 goal < 7.0 Uncontrolled CBG monitoring SSI DM education and close PCP follow up  Hypertension Stable Avoid low BP Long term BP goal normotensive  Hyperlipidemia Home meds: None LDL 111, goal < 70 Now on Lipitor  80 Continue statin at discharge  Other Stroke Risk Factors Advanced age Former smoker Obesity, Body mass index is 32.63 kg/m.  Obstructive sleep apnea, noncompliant with CPAP  Other Active Problems Right renal mass  undergoing workup AKI, creatinine 1.71--1.68--1.70  Hospital day # 2  Neurology will sign off. Please call with questions. Pt will follow up with stroke clinic NP at [facility] in about 4 weeks. Thanks for the consult.   Jonathan Cummins, MD PhD Stroke Neurology 10/15/2023 11:46 AM    To contact Stroke Continuity provider, please refer to [email or web address]. After hours, contact General Neurology

## 2023-10-15 NOTE — Discharge Instructions (Addendum)
 Dear Jonathan [name],  Thank you for letting us  participate in your care. You were hospitalized for weakness on the right side and diagnosed with Stroke [facility]).  This was evaluated with several scans of your head and neck to look for stroke.  You were found to have a stroke.  You were started on medication to help prevent this from happening again.  Your strength should continue to improve over the next several months  POST-HOSPITAL & CARE INSTRUCTIONS You have any new difficulty speaking, slurred speech, weakness suddenly occurs please return to be seen. Please make sure to go to your pharmacy today to pick up your new medications listed below. Go to your follow up appointments (listed below)   DOCTOR'S APPOINTMENT   Future Appointments  Date Time Provider Department Center  10/20/2023  8:40 AM [place], PA-C [facility]  10/31/2023 11:30 AM Donah Laymon PARAS, MD [facility]  11/27/2023  9:50 AM [facility] ANNUAL WELLNESS VISIT [facility]     Take care and be well!  Family Medicine Teaching Service Inpatient Team [place]  [facility]  [address] [place], [place] [address] [phone]

## 2023-10-15 NOTE — Assessment & Plan Note (Addendum)
 BP elevated on admission but has since improved. Home medications (HCTZ, hydralazine , lisinopril , spironolactone ) - Continue lisinopril  40 mg daily - Restart home hydrochlorothiazide  today

## 2023-10-15 NOTE — Assessment & Plan Note (Signed)
 Adequate hospital control of CBGs thus far. - Resistant SSI and CBG monitoring - Start Semglee  as indicated per CBG and SAI, takes Lantus  16u daily at home

## 2023-10-15 NOTE — Progress Notes (Signed)
 Daily Progress Note Intern Pager: [phone]  Patient name: Jonathan [name] Medical record number: [id number] Date of birth: 1950/04/27 Age: 73 y.o. Gender: male  Primary Care Provider: Donah Laymon PARAS, MD Consultants: Neurology Code Status: Full code  Pt Overview and Major Events to Date:  -Admitted 06/20  Assessment and Plan:  Jonathan [name] is a 73 year old male admitted for acute ischemic infarct of the left basal ganglia. Pertinent PMH/PSH includes left frontal subcortical infarct (09/2020), left basal ganglia/internal capsule infarcts (03/2016) with no significant residual deficit, HTN, HLD, T2DM, former tobacco user, COPD, OSA/central sleep apnea nonadherent to CPAP, right renal mass.  Assessment & Plan Stroke [facility]) Neurologic deficits continue to improve today.  P2Y12 platelet inhibition to 220 indicating patient is a nonresponder to Plavix , discussed with Dr. Jerri, patient switching to Pletal. -Neurology following, appreciate recommendations - Continue aspirin  81 mg daily, stop Plavix , Lipitor  80 mg daily - Start Pletal 100 mg twice daily - Blood pressure control as below - Cardiac monitoring for arrhythmia - PT/OT/SLP consult - Continue smoking cessation and risk factor modification - Fall precautions Essential hypertension BP elevated on admission but has since improved. Home medications (HCTZ, hydralazine , lisinopril , spironolactone ) - Continue lisinopril  40 mg daily - Restart home hydrochlorothiazide  today Type 2 diabetes mellitus (HCC) Adequate hospital control of CBGs thus far. - Resistant SSI and CBG monitoring - Start Semglee  as indicated per CBG and SAI, takes Lantus  16u daily at home Chronic health problem OSA/Central Sleep Apnea: not adherent to CPAP d/t discomfort HLD: Amlodipine  80 mg daily, may have not been taking this at home Right renal mass: 3 cm right renal mass noted in 03/2020, mass seems stable on repeat imaging 6 months after.  According to  last PCP note, patient has not followed up with urology at [facility] and follow-up was recommended. Bradycardia: Longstanding history, asymptomatic at baseline CKD- creatinine stable around baseline ~1.6   FEN/GI: Carb modified PPx: Lovenox  Dispo: Dispo potentially today or tomorrow  Subjective:  No acute events overnight, patient doing well.  Reports that his weakness on his right side is completely resolved this a.m.  Objective: Temp:  [97.7 F (36.5 C)-98.3 F (36.8 C)] 97.7 F (36.5 C) (06/22 0743) Pulse Rate:  [43-57] 47 (06/22 0743) Resp:  [15-19] 16 (06/22 0743) BP: (140-168)/(64-79) 157/79 (06/22 0743) SpO2:  [96 %-99 %] 97 % (06/22 0743) Physical Exam: General: NAD, sitting up comfortably in hospital bed eating breakfast Neuro: A&O, cranial nerves II through XII intact, strength 5 out of 5 bilateral upper and lower extremities in all major muscle groups Cardiovascular: RRR, no murmurs, no peripheral edema Respiratory: normal WOB on room air, CTAB, no wheezes, ronchi or rales Extremities: Moving all 4 extremities equally   Laboratory: Most recent CBC Lab Results  Component Value Date   WBC 7.4 10/13/2023   HGB 13.1 10/13/2023   HCT 39.6 10/13/2023   MCV 91.0 10/13/2023   PLT 272 10/13/2023   Most recent BMP    Latest Ref Rng & Units 10/15/2023    5:59 AM  BMP  Glucose 70 - 99 mg/dL 892   BUN 8 - 23 mg/dL 18   Creatinine 9.38 - 1.24 mg/dL 8.29   Sodium 864 - 854 mmol/L 136   Potassium 3.5 - 5.1 mmol/L 3.8   Chloride 98 - 111 mmol/L 113   CO2 22 - 32 mmol/L 18   Calcium  8.9 - 10.3 mg/dL 8.0     Platelet inhibition P2Y12 220  Imaging/Diagnostic Tests: No new imaging Alba Sharper, MD 10/15/2023, 8:11 AM  PGY-2, [facility] Family Medicine FPTS Intern pager: [phone], text pages welcome Secure chat group [facility] Teaching Service

## 2023-10-16 ENCOUNTER — Other Ambulatory Visit: Payer: Self-pay

## 2023-10-16 ENCOUNTER — Telehealth: Payer: Self-pay

## 2023-10-16 NOTE — Transitions of Care (Post Inpatient/ED Visit) (Signed)
   10/16/2023  Name: WAYLEN DEPAOLO MRN: [id number] DOB: [birth year]  Today's TOC FU Call Status: Today's TOC FU Call Status:: Unsuccessful Call (1st Attempt) Unsuccessful Call (1st Attempt) Date: 10/16/23  Attempted to reach the patient regarding the most recent Inpatient/ED visit.  Follow Up Plan: Additional outreach attempts will be made to reach the patient to complete the Transitions of Care (Post Inpatient/ED visit) call.   Shona Prow RN, CCM [place]  VBCI-Population Health RN Care Manager [phone]

## 2023-10-17 ENCOUNTER — Telehealth: Payer: Self-pay

## 2023-10-17 NOTE — Transitions of Care (Post Inpatient/ED Visit) (Signed)
   10/17/2023  Name: MASSIE MEES MRN: [id number] DOB: [birth year]  Today's TOC FU Call Status: Today's TOC FU Call Status:: Unsuccessful Call (2nd Attempt) Unsuccessful Call (2nd Attempt) Date: 10/17/23  Attempted to reach the patient regarding the most recent Inpatient/ED visit.  Follow Up Plan: Additional outreach attempts will be made to reach the patient to complete the Transitions of Care (Post Inpatient/ED visit) call.   Shona Prow RN, CCM [place]  VBCI-Population Health RN Care Manager [phone]

## 2023-10-18 ENCOUNTER — Encounter: Payer: Self-pay | Admitting: Family Medicine

## 2023-10-18 ENCOUNTER — Telehealth: Payer: Self-pay

## 2023-10-18 ENCOUNTER — Ambulatory Visit (INDEPENDENT_AMBULATORY_CARE_PROVIDER_SITE_OTHER): Admitting: Family Medicine

## 2023-10-18 ENCOUNTER — Other Ambulatory Visit (HOSPITAL_COMMUNITY): Payer: Self-pay

## 2023-10-18 ENCOUNTER — Other Ambulatory Visit: Payer: Self-pay

## 2023-10-18 VITALS — BP 112/64 | HR 86 | Ht 66.0 in | Wt 194.8 lb

## 2023-10-18 DIAGNOSIS — R7989 Other specified abnormal findings of blood chemistry: Secondary | ICD-10-CM

## 2023-10-18 DIAGNOSIS — I63512 Cerebral infarction due to unspecified occlusion or stenosis of left middle cerebral artery: Secondary | ICD-10-CM

## 2023-10-18 DIAGNOSIS — Z79899 Other long term (current) drug therapy: Secondary | ICD-10-CM | POA: Diagnosis not present

## 2023-10-18 DIAGNOSIS — E785 Hyperlipidemia, unspecified: Secondary | ICD-10-CM

## 2023-10-18 DIAGNOSIS — I10 Essential (primary) hypertension: Secondary | ICD-10-CM

## 2023-10-18 DIAGNOSIS — N2889 Other specified disorders of kidney and ureter: Secondary | ICD-10-CM

## 2023-10-18 MED ORDER — ASPIRIN 81 MG PO TBEC: 81.0000 mg | DELAYED_RELEASE_TABLET | Freq: Every day | ORAL | Status: DC

## 2023-10-18 MED ORDER — ASPIRIN 81 MG PO TBEC
81.0000 mg | DELAYED_RELEASE_TABLET | Freq: Every day | ORAL | 2 refills | Status: AC
  Filled 2023-10-18: qty 90, 90d supply, fill #0
  Filled 2023-10-18: qty 30, 30d supply, fill #0

## 2023-10-18 NOTE — Assessment & Plan Note (Signed)
 Patient within goal today. Given he had only been taking lisinopril , will only continue lisinopril  and discontinue hydrochlorothiazide .  - Continue lisinopril 

## 2023-10-18 NOTE — Transitions of Care (Post Inpatient/ED Visit) (Signed)
   10/18/2023  Name: Jonathan [name] MRN: [id number] DOB: [birth year]  Today's TOC FU Call Status: Today's TOC FU Call Status:: Unsuccessful Call (3rd Attempt) Unsuccessful Call (3rd Attempt) Date: 10/18/23  Attempted to reach the patient regarding the most recent Inpatient/ED visit.  Follow Up Plan: No further outreach attempts will be made at this time. We have been unable to contact the patient.  Tyree Fluharty J. Neriyah Cercone RN, MSN [facility], [facility] RN Care Manager Direct Dial: [phone]  Fax: [phone] Website: [email or web address]

## 2023-10-18 NOTE — Transitions of Care (Post Inpatient/ED Visit) (Signed)
   10/18/2023  Name: Jonathan [name] MRN: [id number] DOB: 04-01-1951  Today's TOC FU Call Status: Today's TOC FU Call Status:: Successful TOC FU Call Completed TOC FU Call Complete Date: 10/18/23 Patient's Name and Date of Birth confirmed.  Transition Care Management Follow-up Telephone Call How have you been since you were released from the hospital?:  (placed call to patient and spoke with wife who states patient not home. Wife on the [facility].  Wife reports patient is not home. Patient saw PCP today. Wife denies need for call back.)  Patient saw PCP today.  Wife declines need to review discharge - states this was done at MD office today.  Encouraged wife to call back for any concerns.  Alan Ee, RN, BSN, CEN [employer]- Transition of Care Team.  [facility] [phone]

## 2023-10-18 NOTE — Patient Instructions (Signed)
 It was wonderful to see you today.  Please bring ALL of your medications with you to every visit.   Today we talked about:  Blood pressure - Continue taking lisinopril . Since you havent been taking the hydrochlorothiazine, do not take it anymore since your blood pressure is controlled on just lisinpril.   Stroke - For the stroke, continue taking pletal, aspirin , and lipitor .   Please make sure you go to follow up with the Urologist for the kidney mass.   Please look out for a phone call from the neuro rehab center to schedule physical therapy and from the neurologist to make an appointment with them.   Please follow up in 1 month for a follow up regarding chronic health conditions and health maintenance with PCP.   Thank you for choosing [facility] Family [facility].   Please call [phone] with any questions about today's appointment.  Please be sure to schedule follow up at the front desk before you leave today.   Areta Saliva, MD  Family Medicine

## 2023-10-18 NOTE — Progress Notes (Signed)
    SUBJECTIVE:   CHIEF COMPLAINT / HPI:   Hospital Follow up: Stroke   Patient recently discharged on 10/15/2023 after admission for stroke. Had an ischemic stroke in the basal ganglia due to severe stenosis of M1 of L MCA. Patient was evaluated and did not find any metastases of renal mass.   Patient and wife say that they noticed that patient has been walking a little different. He does not report any falls or actual trouble walking, but just that it is different. Feels like it is the same as when he left the hospital, no worse. Wife also mentions that feels like his cognition seems slower. She mentioned that he brings her her coffee every day and he forgot how he likes her coffee. Also mentions that he gets confused sometimes which is different than normal, but not worse than when he left the hospital.   He has a referral for Neuro rehab that is pending. Daughter can take him to the appointments. OT did not recommend any follow ups.   Patient Is continuing to take pletal and lipitor  is not taking aspirin . .   Has been taking lisinpril 40 mg. Has not been taking hydrochlorothiazide  according to wife.   Has called urology regarding renal mass follow up and made appointment with [facility] for 10/18/2023.    PERTINENT  PMH / PSH: CVA, HTN, OSA, Renal Mass, T2DM  OBJECTIVE:   BP 112/64   Pulse 86   Ht 5' 6 (1.676 m)   Wt 194 lb 12.8 oz (88.4 kg)   SpO2 97%   BMI 31.44 kg/m   General: well appearing, sometimes seems dazed, in no acute distress CV: RRR, radial pulses equal and palpable, no BLE edema  Resp: Normal work of breathing on room air, CTAB Abd: Soft, non tender, non distended  Neuro: Alert & Oriented x 4 though sometimes seems to process conversation slower, PERRLA, EOMI, CN2-12 grossly intact  Sensation normal  Strength 5/5 in both upper extremities  Strength 4/5 right lower extremity, 5/5 in left lower extremity  Gait with right leg slightly trailing    ASSESSMENT/PLAN:   Assessment & Plan Stroke due to stenosis of left middle cerebral artery (HCC) Some residual effects including ever so slightly weaker right lower extremity and slightly slowed cognition (also mentioned in hospital OT note).  - Continue pletal, aspirin , lipitor   - Referral pending for neuro rehab  - Referral pending for neuro appt  Essential hypertension Patient within goal today. Given he had only been taking lisinopril , will only continue lisinopril  and discontinue hydrochlorothiazide .  - Continue lisinopril   Renal mass, right Following with [facility] urology. Concerning for primary renal mass. Imaging in hospital was negative for metastases.  Has follow up appointment tomorrow.    Follow up in one month for chronic disease care management.   Areta Saliva, MD [facility]

## 2023-10-18 NOTE — Assessment & Plan Note (Signed)
 Following with [facility] urology. Concerning for primary renal mass. Imaging in hospital was negative for metastases.  Has follow up appointment tomorrow.

## 2023-10-19 ENCOUNTER — Encounter: Payer: Self-pay | Admitting: Physician Assistant

## 2023-10-20 ENCOUNTER — Ambulatory Visit: Admitting: Physician Assistant

## 2023-10-23 ENCOUNTER — Encounter: Payer: Self-pay | Admitting: Pharmacist

## 2023-10-23 ENCOUNTER — Other Ambulatory Visit (HOSPITAL_COMMUNITY): Payer: Self-pay

## 2023-10-23 ENCOUNTER — Other Ambulatory Visit: Payer: Self-pay

## 2023-10-23 DIAGNOSIS — N2889 Other specified disorders of kidney and ureter: Secondary | ICD-10-CM | POA: Diagnosis not present

## 2023-10-26 ENCOUNTER — Other Ambulatory Visit: Payer: Self-pay

## 2023-10-30 ENCOUNTER — Other Ambulatory Visit: Payer: Self-pay

## 2023-10-30 ENCOUNTER — Other Ambulatory Visit (HOSPITAL_COMMUNITY): Payer: Self-pay

## 2023-10-31 ENCOUNTER — Ambulatory Visit (INDEPENDENT_AMBULATORY_CARE_PROVIDER_SITE_OTHER): Admitting: Family Medicine

## 2023-10-31 ENCOUNTER — Other Ambulatory Visit (HOSPITAL_COMMUNITY): Payer: Self-pay

## 2023-10-31 VITALS — BP 114/60 | HR 75 | Ht 66.0 in | Wt 194.4 lb

## 2023-10-31 DIAGNOSIS — E1169 Type 2 diabetes mellitus with other specified complication: Secondary | ICD-10-CM

## 2023-10-31 DIAGNOSIS — R5383 Other fatigue: Secondary | ICD-10-CM

## 2023-10-31 NOTE — Progress Notes (Unsigned)
  Date of Visit: 10/31/2023   SUBJECTIVE:   HPI:  Jonathan [name] presents today for follow up. He is accompanied by his wife Jonathan [name].  Since I last saw him, he was admitted to the hospital with a new stroke.  Has had multiple BP medications discontinued since that time (spironolactone , hydrochlorothiazide , hydralazine ). Remains just on lisinopril  40mg  daily.   He has follow up scheduled soon with neurology and neuro rehab. Also has upcoming visit with urology at [facility].  Diabetes - taking lantus  19u daily. Tolerating this well. Denies any low blood sugars.  OBJECTIVE:   BP 114/60   Pulse 75   Ht 5' 6 (1.676 m)   Wt 194 lb 6.4 oz (88.2 kg)   SpO2 97%   BMI 31.38 kg/m  Gen: no acute distress, pleasant, cooperative. Appears fatigued. HEENT: normocephalic, atraumatic  Heart: regular rate and rhythm, no murmur Lungs: clear to auscultation bilaterally, normal work of breathing  Neuro: speech normal. Face symmetric. Strength similar bilaterally upper and lower extremities.  Ext: No appreciable lower extremity edema bilaterally   ASSESSMENT/PLAN:   Assessment & Plan Other fatigue Remains fatigued and sluggish after recent stroke. Unclear if related to sequelae of stroke or possibly side effect of new medication (pletal ). Interestingly has had marked decrease in antihypertensive requirement since this neuro event. Will check labs today - CBC and BMET to evaluate for underlying causes of fatigue (anemia, uremia, electrolyte disturbance, etc).  Follow up with neuro and OT as scheduled. He will return to see me in 1 month. Type 2 diabetes mellitus with other specified complication, without long-term current use of insulin  (HCC) No adjustments to regimen today given ongoing sluggishness - do not want to exacerbate this with risk of hypoglycemia. Checking BMET to rule out DKA as cause of fatigue (though strongly doubt based on history alone).    FOLLOW UP: Follow up in 1 mo for above  issues  [place] J. Donah, MD [facility] Family Medicine

## 2023-10-31 NOTE — Patient Instructions (Addendum)
 It was great to see you again today.  Checking labs today Follow up with physical therapy and neurology as scheduled  Follow up with me in 1 month   Be well, Dr. Donah

## 2023-11-01 ENCOUNTER — Ambulatory Visit: Payer: Self-pay | Admitting: Family Medicine

## 2023-11-01 DIAGNOSIS — E872 Acidosis, unspecified: Secondary | ICD-10-CM

## 2023-11-01 LAB — CBC
Hematocrit: 43.7 % (ref 37.5–51.0)
Hemoglobin: 15.3 g/dL (ref 13.0–17.7)
MCH: 32.2 pg (ref 26.6–33.0)
MCHC: 35 g/dL (ref 31.5–35.7)
MCV: 92 fL (ref 79–97)
Platelets: 376 x10E3/uL (ref 150–450)
RBC: 4.75 x10E6/uL (ref 4.14–5.80)
RDW: 13 % (ref 11.6–15.4)
WBC: 9.5 x10E3/uL (ref 3.4–10.8)

## 2023-11-01 LAB — BASIC METABOLIC PANEL WITH GFR
BUN/Creatinine Ratio: 10 (ref 10–24)
BUN: 18 mg/dL (ref 8–27)
CO2: 15 mmol/L — ABNORMAL LOW (ref 20–29)
Calcium: 9.6 mg/dL (ref 8.6–10.2)
Chloride: 108 mmol/L — ABNORMAL HIGH (ref 96–106)
Creatinine, Ser: 1.78 mg/dL — ABNORMAL HIGH (ref 0.76–1.27)
Glucose: 112 mg/dL — ABNORMAL HIGH (ref 70–99)
Potassium: 4.9 mmol/L (ref 3.5–5.2)
Sodium: 142 mmol/L (ref 134–144)
eGFR: 40 mL/min/1.73 — ABNORMAL LOW (ref >59)

## 2023-11-01 NOTE — Assessment & Plan Note (Signed)
 No adjustments to regimen today given ongoing sluggishness - do not want to exacerbate this with risk of hypoglycemia. Checking BMET to rule out DKA as cause of fatigue (though strongly doubt based on history alone).

## 2023-11-06 ENCOUNTER — Ambulatory Visit

## 2023-11-08 ENCOUNTER — Encounter: Payer: Self-pay | Admitting: Physical Therapy

## 2023-11-08 ENCOUNTER — Other Ambulatory Visit (HOSPITAL_COMMUNITY): Payer: Self-pay

## 2023-11-08 ENCOUNTER — Ambulatory Visit: Attending: Neurology | Admitting: Physical Therapy

## 2023-11-08 VITALS — BP 151/84 | HR 60

## 2023-11-08 DIAGNOSIS — R2681 Unsteadiness on feet: Secondary | ICD-10-CM | POA: Diagnosis not present

## 2023-11-08 DIAGNOSIS — M6281 Muscle weakness (generalized): Secondary | ICD-10-CM | POA: Diagnosis not present

## 2023-11-08 DIAGNOSIS — R2689 Other abnormalities of gait and mobility: Secondary | ICD-10-CM | POA: Diagnosis not present

## 2023-11-08 DIAGNOSIS — I639 Cerebral infarction, unspecified: Secondary | ICD-10-CM | POA: Diagnosis not present

## 2023-11-08 DIAGNOSIS — M21371 Foot drop, right foot: Secondary | ICD-10-CM | POA: Insufficient documentation

## 2023-11-08 NOTE — Therapy (Signed)
 OUTPATIENT PHYSICAL THERAPY NEURO EVALUATION   Patient Name: Jonathan [name] MRN: [id number] DOB:1950/06/07, 73 y.o., male Today's Date: 11/08/2023   PCP: Donah Laymon PARAS, MD REFERRING PROVIDER: Jerri Pfeiffer, MD  END OF SESSION:  PT End of Session - 11/08/23 1532     Visit Number 1    Number of Visits 9   Plus eval   Date for PT Re-Evaluation 12/13/23    Authorization Type [employer] Dual Complete    PT Start Time 1531    PT Stop Time 1606    PT Time Calculation (min) 35 min    Activity Tolerance Patient tolerated treatment well    Behavior During Therapy WFL for tasks assessed/performed          Past Medical History:  Diagnosis Date   Anxiety    Arthritis    COPD (chronic obstructive pulmonary disease) (HCC)    Depression    Diabetes mellitus without complication (HCC)    Diarrhea    History of kidney stones    HTN (hypertension)    Hyperlipidemia    Hypertensive retinopathy    OU   Pneumonia    Retinal detachment    Rheg. RD OD   Routine adult health maintenance 08/30/2023   Sleep apnea    does not use cpap   Stroke [facility])    denies any deficits   Past Surgical History:  Procedure Laterality Date   CATARACT EXTRACTION Bilateral    Dr. Roz [place]     EYE SURGERY Bilateral    Cat Sx OU; RD repair OD   GAS INSERTION Right 03/13/2019   Procedure: INSERTION OF GAS (C3F8) RIGHT EYE;  Surgeon: Valdemar Rogue, MD;  Location: [facility] OR;  Service: Ophthalmology;  Laterality: Right;   LAPAROSCOPIC APPENDECTOMY N/A 10/03/2020   Procedure: APPENDECTOMY LAPAROSCOPIC;  Surgeon: Rubin Calamity, MD;  Location: [facility] OR;  Service: General;  Laterality: N/A;   LASER PHOTO ABLATION Right 03/13/2019   Procedure: LASER PHOTO  ABLATION RIGHT EYE;  Surgeon: Valdemar Rogue, MD;  Location: [facility] OR;  Service: Ophthalmology;  Laterality: Right;   none     PARS PLANA VITRECTOMY Right 03/13/2019   Procedure: 25 GAUGE PARS PLANA VITRECTOMY WITH  INTRAOCULAR LENSE EXPLANTATION RIGHT  EYE.;  Surgeon: Valdemar Rogue, MD;  Location: [facility] OR;  Service: Ophthalmology;  Laterality: Right;   PARS PLANA VITRECTOMY Right 06/27/2019   Procedure: PARS PLANA VITRECTOMY WITH 25 GAUGE;  Surgeon: Valdemar Rogue, MD;  Location: [facility] OR;  Service: Ophthalmology;  Laterality: Right;   PHOTOCOAGULATION WITH LASER Left 03/13/2019   Procedure: INDIRECT PHOTOCOAGULATION WITH LASER LEFT EYE;  Surgeon: Valdemar Rogue, MD;  Location: [facility] OR;  Service: Ophthalmology;  Laterality: Left;   PLACEMENT AND SUTURE OF SECONDARY INTRAOCULAR LENS Right 06/27/2019   Procedure: PLACEMENT AND SUTURE OF SECONDARY INTRAOCULAR LENS;  Surgeon: Valdemar Rogue, MD;  Location: [facility] OR;  Service: Ophthalmology;  Laterality: Right;   RETINAL DETACHMENT SURGERY Right 03/13/2019   PPV for repair of rheg. RD - Dr. Rogue Valdemar   Patient Active Problem List   Diagnosis Date Noted   Chronic health problem 10/14/2023   Stroke (HCC) 10/13/2023   Routine adult health maintenance 08/30/2023   Rash and nonspecific skin eruption 12/28/2021   Liver mass 10/13/2020   Kidney mass 05/19/2020   Aortic atherosclerosis (HCC) 03/28/2020   Complex sleep apnea syndrome 01/23/2019   Treatment-emergent central sleep apnea 01/23/2019   Severe obstructive sleep apnea-hypopnea syndrome 01/23/2019   OSA and COPD overlap  syndrome (HCC) 01/23/2019   History of stroke 04/04/2016   Type 2 diabetes mellitus (HCC) 04/01/2016   Epigastric hernia 12/01/2015   Allergic rhinitis 10/31/2012   HLD (hyperlipidemia) 09/07/2011   OSA (obstructive sleep apnea) 06/17/2011   Hearing loss 01/17/2011   H/O: asbestos exposure 11/18/2010   Inguinal hernia, right 11/07/2010   Essential hypertension 08/17/2010   Bradycardia 08/17/2010   Tobacco use disorder 08/17/2010    ONSET DATE: 10/15/2023 (referral)   REFERRING DIAG: I63.9 (ICD-10-CM) - Cerebral infarction, unspecified mechanism (HCC)  THERAPY DIAG:  Muscle weakness (generalized)  Other abnormalities of gait  and mobility  Foot drop, right  Unsteadiness on feet  Rationale for Evaluation and Treatment: Rehabilitation  SUBJECTIVE:                                                                                                                                                                                             SUBJECTIVE STATEMENT: Pt presents without AD. States he feels like he is getting stronger each day. Denies falls since leaving the hospital but reports a few near misses when reaching for things. Did not use a cane at home like recommended. Reports he has had several near misses this year, but unable to elaborate on this. Likes to play with his grandkids and walk around outside.    Pt accompanied by: self and Wife, [name], in lobby   PERTINENT HISTORY: left frontal subcortical infarction (09/2020), left basal ganglia/internal capsule infarcts (03/2016) with no significant residual deficit, HTN, HLD, T2DM, former tobacco user, COPD, OSA/central sleep apnea not adherent to CPAP.  PAIN:  Are you having pain? No  PRECAUTIONS: Fall  RED FLAGS: None   WEIGHT BEARING RESTRICTIONS: No  FALLS: Has patient fallen in last 6 months? No and Reports frequent near misses throughout the year   LIVING ENVIRONMENT: Lives with: lives with their spouse Lives in: Mobile home Stairs: Ramped entry  Has following equipment at home: Single point cane  PLOF: Independent  PATIENT GOALS:  Get back to lifting my leg up and not dragging it   OBJECTIVE:  Note: Objective measures were completed at Evaluation unless otherwise noted.  DIAGNOSTIC FINDINGS: MRI of brain from 10/14/23  IMPRESSION: 1. No brain metastases. 2. Severe stenosis of the proximal M1 segment of the left MCA. 3. Moderate stenosis of the left PCA P1-2 junction. 4. No hemodynamically significant stenosis of the internal carotid arteries by NASCET criteria.  MRI of brain from 10/13/23  IMPRESSION: A 2.5 cm area of acute  ischemia at the left basal ganglia. No hemorrhage or mass effect.  COGNITION: Overall cognitive status: Difficulty to  assess due to: no family present   SENSATION: Pt denies numbness/tingling in all extremities     POSTURE: rounded shoulders, increased thoracic kyphosis, and weight shift right  LOWER EXTREMITY ROM:     Active  Right Eval Left Eval  Hip flexion    Hip extension    Hip abduction    Hip adduction    Hip internal rotation    Hip external rotation    Knee flexion    Knee extension    Ankle dorsiflexion    Ankle plantarflexion    Ankle inversion    Ankle eversion     (Blank rows = not tested)  LOWER EXTREMITY MMT:  Tested in seated position   MMT Right Eval Left Eval  Hip flexion 4+ 5  Hip extension    Hip abduction 4+ 5  Hip adduction 4+ 5  Hip internal rotation    Hip external rotation    Knee flexion 5 5  Knee extension 4+ 5  Ankle dorsiflexion 4 5  Ankle plantarflexion    Ankle inversion    Ankle eversion    (Blank rows = not tested)  BED MOBILITY:  Not tested Pt reports independence with this   TRANSFERS: Sit to stand: Modified independence  Assistive device utilized: None     Stand to sit: Modified independence  Assistive device utilized: None      RAMP:  Not tested  CURB:  Not tested  STAIRS: Findings: Level of Assistance: Modified independence, Stair Negotiation Technique: Alternating Pattern  with Bilateral Rails, Number of Stairs: 4, Height of Stairs: 6   , and Comments: adequate step clearance of RLE noted  GAIT: Gait pattern: decreased arm swing- Right, decreased stride length, decreased hip/knee flexion- Right, decreased ankle dorsiflexion- Right, shuffling, lateral hip instability, lateral lean- Left, decreased trunk rotation, and poor foot clearance- Right Distance walked: Various clinic distances  Assistive device utilized: None Level of assistance: SBA Comments: Pt slides RLE on ground despite 4/5 R ankle DF and hip  flexion  MMT and adequate step clearance noted on stairs. Noted inconsistent mild R knee extension thrust    FUNCTIONAL TESTS:   [facility] PT Assessment - 11/08/23 1545       Transfers   Five time sit to stand comments  17.62s   no UE support, weight shift L     Ambulation/Gait   Gait velocity 32.8' over 15.47s = 2.12 ft/s   No AD, dragging RLE     Functional Gait  Assessment   Gait assessed  Yes    Gait Level Surface Walks 20 ft, slow speed, abnormal gait pattern, evidence for imbalance or deviates 10-15 in outside of the 12 in walkway width. Requires more than 7 sec to ambulate 20 ft.   9.25s   Change in Gait Speed Makes only minor adjustments to walking speed, or accomplishes a change in speed with significant gait deviations, deviates 10-15 in outside the 12 in walkway width, or changes speed but loses balance but is able to recover and continue walking.    Gait with Horizontal Head Turns Performs head turns smoothly with slight change in gait velocity (eg, minor disruption to smooth gait path), deviates 6-10 in outside 12 in walkway width, or uses an assistive device.    Gait with Vertical Head Turns Performs task with slight change in gait velocity (eg, minor disruption to smooth gait path), deviates 6 - 10 in outside 12 in walkway width or uses assistive device  Gait and Pivot Turn Pivot turns safely in greater than 3 sec and stops with no loss of balance, or pivot turns safely within 3 sec and stops with mild imbalance, requires small steps to catch balance.    Step Over Obstacle Is able to step over one shoe box (4.5 in total height) without changing gait speed. No evidence of imbalance.    Gait with Narrow Base of Support Ambulates 4-7 steps.   6 steps   Gait with Eyes Closed Cannot walk 20 ft without assistance, severe gait deviations or imbalance, deviates greater than 15 in outside 12 in walkway width or will not attempt task.   Significant lateral deviation to L, ran into obstacles  prior to walking full distance   Ambulating Backwards Walks 20 ft, slow speed, abnormal gait pattern, evidence for imbalance, deviates 10-15 in outside 12 in walkway width.   38.13s   Steps Alternating feet, must use rail.    Total Score 14    FGA comment: 14/30, high fall risk   no AFO or AD        MCTSIB: Condition 1: Avg of 3 trials: 30 sec, Condition 2: Avg of 3 trials: 30 sec, Condition 3: Avg of 3 trials: 30 sec, Condition 4: Avg of 3 trials: 30 sec (min anterolateral sway to L), and Total Score: 120/120    VITALS  Vitals:   11/08/23 1541  BP: (!) 151/84  Pulse: 60                                                                                                                                 TREATMENT:   Self-care/home management  Assessed vitals (see above) and systolic BP elevated but within limits for session Educated pt on potentially trialing AFO on RLE to determine if appropriate for pt, as pt does not demonstrate true foot drop and is able to lift foot if cued. Pt giggled at this. Encouraged pt to focus his attention on RLE w/gait and be intentional w/lifting foot, pt verbalized understanding   PATIENT EDUCATION: Education details: POC, eval findings, see self-care section  Person educated: Patient Education method: Explanation Education comprehension: verbalized understanding and needs further education  HOME EXERCISE PROGRAM: To be established   GOALS: Goals reviewed with patient? Yes  STG =LTG due to POC length    LONG TERM GOALS: Target date: 12/06/2023   *** Baseline:  Goal status: INITIAL  2.  *** Baseline:  Goal status: INITIAL  3.  *** Baseline:  Goal status: INITIAL  4.  *** Baseline:  Goal status: INITIAL  5.  *** Baseline:  Goal status: INITIAL  6.  *** Baseline:  Goal status: INITIAL  ASSESSMENT:  CLINICAL IMPRESSION: Patient is a 73 year old male referred to Neuro OPPT for cerebral infarction. Pt's PMH is significant for:  left frontal subcortical infarction (09/2020), left basal ganglia/internal capsule infarcts (03/2016) with no significant residual deficit, HTN, HLD,  T2DM, former tobacco user, COPD, OSA/central sleep apnea not adherent to CPAP. The following deficits were present during the exam: impaired functional strength, decreased safety awareness, impaired balance and shuffled gait. Based on FGA, gait speed, shuffled gait and history of near misses, pt is an incr risk for falls. Pt does not demonstrate true foot drop, but slides his foot on ground in PF position w/ambulation despite cues. Pt would benefit from skilled PT to address these impairments and functional limitations to maximize functional mobility independence.    OBJECTIVE IMPAIRMENTS: Abnormal gait, decreased activity tolerance, decreased balance, decreased cognition, decreased coordination, decreased endurance, decreased knowledge of condition, decreased knowledge of use of DME, decreased mobility, difficulty walking, decreased strength, decreased safety awareness, and improper body mechanics  ACTIVITY LIMITATIONS: carrying, lifting, bending, transfers, and locomotion level  PARTICIPATION LIMITATIONS: community activity and yard work  PERSONAL FACTORS: Past/current experiences and 1 comorbidity: multiple infarcts are also affecting patient's functional outcome.   REHAB POTENTIAL: Good  CLINICAL DECISION MAKING: Evolving/moderate complexity  EVALUATION COMPLEXITY: Moderate  PLAN:  PT FREQUENCY: 2x/week  PT DURATION: 4 weeks  PLANNED INTERVENTIONS: 02835- PT Re-evaluation, 97750- Physical Performance Testing, 97110-Therapeutic exercises, 97530- Therapeutic activity, [id number]- Neuromuscular re-education, 97535- Self Care, [id number]- Manual therapy, [phone]- Gait training, [phone]- Orthotic Initial, [phone]- Orthotic/Prosthetic subsequent, [phone]- Aquatic Therapy, [phone]- Electrical stimulation (manual), Patient/Family education, Balance training, Stair  training, Joint mobilization, Spinal mobilization, and DME instructions  PLAN FOR NEXT SESSION: ***   Salwa Bai E Josslyn Ciolek, PT, DPT 11/08/2023, 4:10 PM

## 2023-11-09 ENCOUNTER — Other Ambulatory Visit (HOSPITAL_COMMUNITY): Payer: Self-pay

## 2023-11-10 ENCOUNTER — Other Ambulatory Visit (HOSPITAL_COMMUNITY): Payer: Self-pay

## 2023-11-15 NOTE — Progress Notes (Signed)
 [facility] [address] [place]  [address] [phone]       HOSPITAL FOLLOW UP NOTE  [name] Date of Birth:  [birth year] Medical Record Number:  [id number]   Reason for Referral:  hospital stroke follow up    SUBJECTIVE:   CHIEF COMPLAINT:  Chief Complaint  Patient presents with   Cerebrovascular Accident    RM 8 alone Pt is well, reports R sided weakness and dragging leg when ambulating. No other concerns.     HPI:   [name] is a 73 y.o. male with history of hypertension, hyperlipidemia, diabetes, former smoker, OSA not compliant with CPAP, right renal mass, and hx of strokes in 2017 and 2022 who presented to ED on 10/13/2023 with right-sided weakness and slurred speech.  Stroke workup revealed left BG infarct likely secondary to large vessel disease source from left M1 severe stenosis as evidenced on MRA head.  Carotid Doppler unremarkable 08/2023.  EF 60 to 65% 06/2023.  LDL 111, A1c 8.8.  On aspirin  and Plavix  PTA, P2Y12 220, likely Plavix  nonresponder and was placed on aspirin  and Pletal  as well as atorvastatin  80 mg daily.  Adjustments made to antihypertensive regimen.  Advised PCP follow-up for uncontrolled DM.  Therapies recommended outpatient PT.   Today, 11/21/2023, patient is being seen for initial hospital follow-up unaccompanied. Reports continued right sided weakness and gait difficulty with dragging right leg but has been gradually improving since discharge.  Ambulates without AD, denies any recent falls.  Was evaluated by PT on 7/16 and scheduled for follow-up visit next week.  No new stroke/TIA symptoms. Reports compliance on aspirin  and Pletal  as well as atorvastatin  without side effects. Blood pressure today significantly elevated, initially 211/88 and on repeat 190/98, asymptomatic.  He does not routinely monitor at home. PCP discontinued lisinopril  3 weeks ago due to concern of dehydration on lab work, advised to  repeat labs 1 week after stopping but never completed. Has f/u visit with PCP next month.  No questions or concerns at this time.    PERTINENT IMAGING  CT possible left BG infarct, old left BG and right thalamus infarcts MRI left BG infarct MRA head and neck left M1 severe stenosis, left P1/P2 moderate stenosis Carotid Doppler unremarkable 08/2023 2D Echo 60 to 65% in 06/2023 LDL 111 HgbA1c 8.8 UDS negative P2 Y12 = 220, likely plavix  non-responder    ROS:   14 system review of systems performed and negative with exception of those listed in HPI  PMH:  Past Medical History:  Diagnosis Date   Anxiety    Arthritis    COPD (chronic obstructive pulmonary disease) (HCC)    Depression    Diabetes mellitus without complication (HCC)    Diarrhea    History of kidney stones    HTN (hypertension)    Hyperlipidemia    Hypertensive retinopathy    OU   Pneumonia    Retinal detachment    Rheg. RD OD   Routine adult health maintenance 08/30/2023   Sleep apnea    does not use cpap   Stroke [facility])    denies any deficits    PSH:  Past Surgical History:  Procedure Laterality Date   CATARACT EXTRACTION Bilateral    Dr. Roz INFIELD     EYE SURGERY Bilateral    Cat Sx OU; RD repair OD   GAS INSERTION Right 03/13/2019   Procedure: INSERTION OF GAS (C3F8) RIGHT EYE;  Surgeon: Valdemar,  Redell, MD;  Location: [facility] OR;  Service: Ophthalmology;  Laterality: Right;   LAPAROSCOPIC APPENDECTOMY N/A 10/03/2020   Procedure: APPENDECTOMY LAPAROSCOPIC;  Surgeon: Rubin Calamity, MD;  Location: [facility] OR;  Service: General;  Laterality: N/A;   LASER PHOTO ABLATION Right 03/13/2019   Procedure: LASER PHOTO  ABLATION RIGHT EYE;  Surgeon: Valdemar Redell, MD;  Location: [facility] OR;  Service: Ophthalmology;  Laterality: Right;   none     PARS PLANA VITRECTOMY Right 03/13/2019   Procedure: 25 GAUGE PARS PLANA VITRECTOMY WITH  INTRAOCULAR LENSE EXPLANTATION RIGHT EYE.;  Surgeon: Valdemar Redell, MD;   Location: [facility] OR;  Service: Ophthalmology;  Laterality: Right;   PARS PLANA VITRECTOMY Right 06/27/2019   Procedure: PARS PLANA VITRECTOMY WITH 25 GAUGE;  Surgeon: Valdemar Redell, MD;  Location: [facility] OR;  Service: Ophthalmology;  Laterality: Right;   PHOTOCOAGULATION WITH LASER Left 03/13/2019   Procedure: INDIRECT PHOTOCOAGULATION WITH LASER LEFT EYE;  Surgeon: Valdemar Redell, MD;  Location: [facility] OR;  Service: Ophthalmology;  Laterality: Left;   PLACEMENT AND SUTURE OF SECONDARY INTRAOCULAR LENS Right 06/27/2019   Procedure: PLACEMENT AND SUTURE OF SECONDARY INTRAOCULAR LENS;  Surgeon: Valdemar Redell, MD;  Location: [facility] OR;  Service: Ophthalmology;  Laterality: Right;   RETINAL DETACHMENT SURGERY Right 03/13/2019   PPV for repair of rheg. RD - Dr. Redell Valdemar    Social History:  Social History   Socioeconomic History   Marital status: Married    Spouse name: Not on file   Number of children: 7   Years of education: Not on file   Highest education level: 12th grade  Occupational History   Occupation: welder  Tobacco Use   Smoking status: Former    Current packs/day: 0.00    Average packs/day: 1 pack/day for 50.0 years (50.0 ttl pk-yrs)    Types: Cigarettes    Quit date: 08/2021    Years since quitting: 2.2   Smokeless tobacco: Never   Tobacco comments:       Vaping Use   Vaping status: Never Used  Substance and Sexual Activity   Alcohol use: No   Drug use: No   Sexual activity: Not on file  Other Topics Concern   Not on file  Social History Narrative   Unemployed. Currently in process of applying for disability.    Social Drivers of Health   Financial Resource Strain: Medium Risk (10/30/2023)   Overall Financial Resource Strain (CARDIA)    Difficulty of Paying Living Expenses: Somewhat hard  Food Insecurity: Food Insecurity Present (10/30/2023)   Hunger Vital Sign    Worried About Running Out of Food in the Last Year: Sometimes true    Ran Out of Food in the Last Year: Sometimes  true  Transportation Needs: Unmet Transportation Needs (10/30/2023)   PRAPARE - Administrator, Civil Service (Medical): Yes    Lack of Transportation (Non-Medical): Yes  Physical Activity: Insufficiently Active (10/30/2023)   Exercise Vital Sign    Days of Exercise per Week: 2 days    Minutes of Exercise per Session: 20 min  Stress: No Stress Concern Present (10/30/2023)   [employer] of Occupational Health - Occupational Stress Questionnaire    Feeling of Stress: Only a little  Social Connections: Moderately Integrated (10/30/2023)   Social Connection and Isolation Panel    Frequency of Communication with Friends and Family: Once a week    Frequency of Social Gatherings with Friends and Family: More than three times a week  Attends Religious Services: More than 4 times per year    Active Member of Clubs or Organizations: No    Attends Banker Meetings: Not on file    Marital Status: Married  Intimate Partner Violence: Not At Risk (10/14/2023)   Humiliation, Afraid, Rape, and Kick questionnaire    Fear of Current or Ex-Partner: No    Emotionally Abused: No    Physically Abused: No    Sexually Abused: No    Family History:  Family History  Problem Relation Age of Onset   Cancer Mother        brain tumor   Heart disease Father    Cirrhosis Father     Medications:   Current Outpatient Medications on File Prior to Visit  Medication Sig Dispense Refill   albuterol  (VENTOLIN  HFA) 108 (90 Base) MCG/ACT inhaler Inhale 2 puffs into the lungs every 6 (six) hours as needed. (Patient taking differently: Inhale 2 puffs into the lungs every 6 (six) hours as needed for shortness of breath or wheezing.) 6.7 g 2   aspirin  EC 81 MG tablet Take 1 tablet (81 mg total) by mouth daily. Swallow whole. 30 tablet 2   atorvastatin  (LIPITOR ) 80 MG tablet Take 1 tablet (80 mg total) by mouth daily. 30 tablet 0   Blood Glucose Monitoring Suppl (ONETOUCH VERIO) w/Device KIT Use  to check blood sugar 3 times daily 1 kit 0   Blood Pressure Monitoring (3 SERIES BP MONITOR/UPPER ARM) DEVI Check blood pressure once a day 1 each 0   buPROPion  (WELLBUTRIN  XL) 300 MG 24 hr tablet Take 1 tablet (300 mg total) by mouth every morning. 90 tablet 1   cetirizine  (ZYRTEC ) 10 MG tablet Take 1 tablet (10 mg total) by mouth daily. 90 tablet 1   fluticasone  (FLONASE ) 50 MCG/ACT nasal spray Place 1 spray into both nostrils daily. 16 g 2   glucose blood (ONETOUCH VERIO) test strip Use to check blood sugar 3 times daily 100 each 3   insulin  glargine (LANTUS ) 100 unit/mL SOPN Inject 18 Units into the skin daily. (Patient taking differently: Inject 19 Units into the skin daily.) 15 mL 1   ipratropium (ATROVENT ) 0.03 % nasal spray Place 2 sprays into both nostrils every 12 (twelve) hours. 30 mL 3   Lancet Devices (ONE TOUCH DELICA LANCING DEV) MISC Use to check blood sugar 3 times daily 1 each 0   lisinopril  (ZESTRIL ) 40 MG tablet Take 1 tablet (40 mg total) by mouth daily. 90 tablet 1   OneTouch Delica Lancets 33G MISC Use to check blood sugar 3 times daily 100 each 3   Zoster Vaccine Adjuvanted (SHINGRIX ) injection Administer Shingrix  vaccination now and repeat in two months 1 each 1   No current facility-administered medications on file prior to visit.    Allergies:  No Known Allergies    OBJECTIVE:  Physical Exam  Vitals:   11/21/23 1011 11/21/23 1014 11/21/23 1028  BP: (!) 211/88 (!) 206/98 (!) 190/98  Pulse: (!) 53    Weight: 191 lb (86.6 kg)    Height: 5' 6 (1.676 m)     Body mass index is 30.83 kg/m. No results found.  General: well developed, well nourished, pleasant elderly Caucasian male, seated, in no evident distress  Neurologic Exam Mental Status: Awake and fully alert.  No evidence of aphasia or dysarthria.  Oriented to place and time. Recent and remote memory intact. Attention span, concentration and fund of knowledge appropriate. Mood and affect appropriate.  Cranial Nerves: Fundoscopic exam reveals sharp disc margins. Pupils equal, briskly reactive to light. Extraocular movements full without nystagmus. Visual fields full to confrontation. Hearing intact. Facial sensation intact. Face, tongue, palate moves normally and symmetrically.  Motor: Normal bulk and tone. Normal strength in all tested extremity muscles except possible very slight RUE weakness and slight right ADF weakness Sensory.: intact to touch , pinprick , position and vibratory sensation.  Coordination: Rapid alternating movements normal in all extremities. Finger-to-nose and heel-to-shin performed accurately bilaterally. Gait and Station: Arises from chair without difficulty. Stance is normal. Gait demonstrates decreased stride length and step height of RLE without use of AD. Reflexes: 1+ and symmetric. Toes downgoing.     NIHSS  0 Modified Rankin  2      ASSESSMENT: Jonathan [name] is a 73 y.o. year old male with left BG stroke on 10/13/2023 likely secondary to large vessel disease. Vascular risk factors include hx of stroke 2017 and 2022, HTN, HLD, DM, former tobacco use and OSA not on CPAP.      PLAN:  Left BG stroke: Hx of prior strokes:   Residual deficit: Possible very slight right sided weakness and gait impairment.  Continue working with PT for likely ongoing recovery. Continue aspirin  81mg  daily and Pletal  and atorvastatin  (Lipitor ) for secondary stroke prevention managed/prescribed by PCP.  Suspected to be plavix  nonresponder (P2Y12 220) Discussed secondary stroke prevention measures and importance of close PCP follow up for aggressive stroke risk factor management including BP goal<130/90, HLD with LDL goal<70 and DM with A1c.<7 .  Stroke labs 09/2023: LDL 111, A1c 8.8 I have gone over the pathophysiology of stroke, warning signs and symptoms, risk factors and their management in some detail with instructions to go to the closest emergency room for symptoms of  concern.  Hypertension: Significantly elevated blood pressure today, asymptomatic Possibly d/t stopping lisinopril  3 weeks ago, will recheck BMP today and will notify PCP for further recommendations Highly encouraged routine monitoring at home    No further recommendations from stroke standpoint and is routinely followed by PCP.  Patient can follow-up in office on an as-needed basis and advised to call with any questions or concerns in the future.   CC:  [facility] provider: Dr. Rosemarie PCP: Donah Laymon PARAS, MD    I personally spent a total of 50 minutes in the care of the patient today including preparing to see the patient, performing a medically appropriate exam/evaluation, counseling and educating, placing orders, referring and communicating with other health care professionals, and documenting clinical information in the EHR.  Time also spent reviewing hospitalization in 09/2023.  Harlene Bogaert, AGNP-BC  [facility] [address] [place], [place] [address]  Phone [phone] Fax [phone] Note: This document was prepared with digital dictation and possible smart phrase technology. Any transcriptional errors that result from this process are unintentional.

## 2023-11-16 ENCOUNTER — Telehealth: Payer: Self-pay | Admitting: Physical Therapy

## 2023-11-16 ENCOUNTER — Ambulatory Visit: Admitting: Physical Therapy

## 2023-11-16 NOTE — Telephone Encounter (Signed)
 Called pt and left VM regarding no-show to scheduled PT appointment. Informed pt of next appointment date and time and provided clinic phone number if pt needs to cancel. Reminded pt of no-show policy and informed him that this is his first no-show.   Xion Debruyne E Linsie Lupo, PT, DPT

## 2023-11-20 ENCOUNTER — Other Ambulatory Visit: Payer: Self-pay

## 2023-11-20 ENCOUNTER — Other Ambulatory Visit (HOSPITAL_COMMUNITY): Payer: Self-pay

## 2023-11-21 ENCOUNTER — Encounter: Payer: Self-pay | Admitting: Adult Health

## 2023-11-21 ENCOUNTER — Ambulatory Visit (INDEPENDENT_AMBULATORY_CARE_PROVIDER_SITE_OTHER): Admitting: Adult Health

## 2023-11-21 VITALS — BP 190/98 | HR 53 | Ht 66.0 in | Wt 191.0 lb

## 2023-11-21 DIAGNOSIS — Z8673 Personal history of transient ischemic attack (TIA), and cerebral infarction without residual deficits: Secondary | ICD-10-CM | POA: Diagnosis not present

## 2023-11-21 DIAGNOSIS — I10 Essential (primary) hypertension: Secondary | ICD-10-CM | POA: Diagnosis not present

## 2023-11-21 DIAGNOSIS — I6381 Other cerebral infarction due to occlusion or stenosis of small artery: Secondary | ICD-10-CM

## 2023-11-21 NOTE — Progress Notes (Signed)
 No, I did not make any medication recommendations for BP management today, we usually will defer this to PCP or whoever is treating HTN. Looks like BP was very well controlled previously on lisinopril  but was discontinued a few weeks ago due to concern of dehydration. I am not sure if she would want to restart this based on lab work completed today (which was discussed with patient) but would prefer if you all could further make this recommendation. I will forward the results tomorrow once resulted. Thank you for following up!

## 2023-11-21 NOTE — Patient Instructions (Addendum)
 Continue working with PT for hopeful ongoing recovery  Continue aspirin  81 mg daily and Pletal   and atorvastatin  for secondary stroke prevention  Will follow up with your PCP regarding blood pressure today. Would highly recommend you routinely monitor your blood pressure at home at least daily.   Continue to follow up with PCP regarding blood pressure, cholesterol and diabetes management  Maintain strict control of hypertension with blood pressure goal below 130/90, diabetes with hemoglobin A1c goal below 7.0 % and cholesterol with LDL cholesterol (bad cholesterol) goal below 70 mg/dL.   Signs of a Stroke? Follow the BEFAST method:  Balance Watch for a sudden loss of balance, trouble with coordination or vertigo Eyes Is there a sudden loss of vision in one or both eyes? Or double vision?  Face: Ask the person to smile. Does one side of the face droop or is it numb?  Arms: Ask the person to raise both arms. Does one arm drift downward? Is there weakness or numbness of a leg? Speech: Ask the person to repeat a simple phrase. Does the speech sound slurred/strange? Is the person confused ? Time: If you observe any of these signs, call 911.         Thank you for coming to see us  at [facility]. I hope we have been able to provide you high quality care today.  You may receive a patient satisfaction survey over the next few weeks. We would appreciate your feedback and comments so that we may continue to improve ourselves and the health of our patients.    Stroke Prevention Some medical conditions and lifestyle choices can lead to a higher risk for a stroke. You can help to prevent a stroke by eating healthy foods and exercising. It also helps to not smoke and to manage any health problems you may have. How can this condition affect me? A stroke is an emergency. It should be treated right away. A stroke can lead to brain damage or threaten your life. There is a better  chance of surviving and getting better after a stroke if you get medical help right away. What can increase my risk? The following medical conditions may increase your risk of a stroke: Diseases of the heart and blood vessels (cardiovascular disease). High blood pressure (hypertension). Diabetes. High cholesterol. Sickle cell disease. Problems with blood clotting. Being very overweight. Sleeping problems (obstructivesleep apnea). Other risk factors include: Being older than age 58. A history of blood clots, stroke, or mini-stroke (TIA). Race, ethnic background, or a family history of stroke. Smoking or using tobacco products. Taking birth control pills, especially if you smoke. Heavy alcohol and drug use. Not being active. What actions can I take to prevent this? Manage your health conditions High cholesterol. Eat a healthy diet. If this is not enough to manage your cholesterol, you may need to take medicines. Take medicines as told by your doctor. High blood pressure. Try to keep your blood pressure below 130/80. If your blood pressure cannot be managed through a healthy diet and regular exercise, you may need to take medicines. Take medicines as told by your doctor. Ask your doctor if you should check your blood pressure at home. Have your blood pressure checked every year. Diabetes. Eat a healthy diet and get regular exercise. If your blood sugar (glucose) cannot be managed through diet and exercise, you may need to take medicines. Take medicines as told by your doctor. Talk to your doctor about getting checked for sleeping  problems. Signs of a problem can include: Snoring a lot. Feeling very tired. Make sure that you manage any other conditions you have. Nutrition  Follow instructions from your doctor about what to eat or drink. You may be told to: Eat and drink fewer calories each day. Limit how much salt (sodium) you use to 1,500 milligrams (mg) each day. Use only  healthy fats for cooking, such as olive oil, canola oil, and sunflower oil. Eat healthy foods. To do this: Choose foods that are high in fiber. These include whole grains, and fresh fruits and vegetables. Eat at least 5 servings of fruits and vegetables a day. Try to fill one-half of your plate with fruits and vegetables at each meal. Choose low-fat (lean) proteins. These include low-fat cuts of meat, chicken without skin, fish, tofu, beans, and nuts. Eat low-fat dairy products. Avoid foods that: Are high in salt. Have saturated fat. Have trans fat. Have cholesterol. Are processed or pre-made. Count how many carbohydrates you eat and drink each day. Lifestyle If you drink alcohol: Limit how much you have to: 0-1 drink a day for women who are not pregnant. 0-2 drinks a day for men. Know how much alcohol is in your drink. In the [place] one drink equals one 12 oz bottle of beer ( ), one 5 oz glass of wine ( ), or one 1 oz glass of hard liquor (44mL). Do not smoke or use any products that have nicotine  or tobacco. If you need help quitting, ask your doctor. Avoid secondhand smoke. Do not use drugs. Activity  Try to stay at a healthy weight. Get at least 30 minutes of exercise on most days, such as: Fast walking. Biking. Swimming. Medicines Take over-the-counter and prescription medicines only as told by your doctor. Avoid taking birth control pills. Talk to your doctor about the risks of taking birth control pills if: You are over 17 years old. You smoke. You get very bad headaches. You have had a blood clot. Where to find more information American Stroke Association: www.strokeassociation.org Get help right away if: You or a loved one has any signs of a stroke. BE FAST is an easy way to remember the warning signs: B - Balance. Dizziness, sudden trouble walking, or loss of balance. E - Eyes. Trouble seeing or a change in how you see. F - Face. Sudden weakness or loss  of feeling of the face. The face or eyelid may droop on one side. A - Arms. Weakness or loss of feeling in an arm. This happens all of a sudden and most often on one side of the body. S - Speech. Sudden trouble speaking, slurred speech, or trouble understanding what people say. T - Time. Time to call emergency services. Write down what time symptoms started. You or a loved one has other signs of a stroke, such as: A sudden, very bad headache with no known cause. Feeling like you may vomit (nausea). Vomiting. A seizure. These symptoms may be an emergency. Get help right away. Call your local emergency services (911 in the [place] Do not wait to see if the symptoms will go away. Do not drive yourself to the hospital. Summary You can help to prevent a stroke by eating healthy, exercising, and not smoking. It also helps to manage any health problems you have. Do not smoke or use any products that contain nicotine  or tobacco. Get help right away if you or a loved one has any signs of a stroke. This information is not  intended to replace advice given to you by your health care provider. Make sure you discuss any questions you have with your health care provider. Document Revised: 03/14/2022 Document Reviewed: 03/14/2022 Elsevier Patient Education  2024 [employer].

## 2023-11-22 ENCOUNTER — Other Ambulatory Visit (HOSPITAL_COMMUNITY): Payer: Self-pay

## 2023-11-22 ENCOUNTER — Ambulatory Visit: Payer: Self-pay | Admitting: Adult Health

## 2023-11-22 LAB — BASIC METABOLIC PANEL WITH GFR
BUN/Creatinine Ratio: 11 (ref 10–24)
BUN: 16 mg/dL (ref 8–27)
CO2: 20 mmol/L (ref 20–29)
Calcium: 9 mg/dL (ref 8.6–10.2)
Chloride: 106 mmol/L (ref 96–106)
Creatinine, Ser: 1.47 mg/dL — ABNORMAL HIGH (ref 0.76–1.27)
Glucose: 158 mg/dL — ABNORMAL HIGH (ref 70–99)
Potassium: 3.9 mmol/L (ref 3.5–5.2)
Sodium: 138 mmol/L (ref 134–144)
eGFR: 50 mL/min/1.73 — ABNORMAL LOW (ref >59)

## 2023-11-23 ENCOUNTER — Other Ambulatory Visit (HOSPITAL_COMMUNITY): Payer: Self-pay

## 2023-11-23 ENCOUNTER — Telehealth (HOSPITAL_COMMUNITY): Payer: Self-pay

## 2023-11-23 NOTE — Telephone Encounter (Signed)
 Pharmacy Patient Advocate Encounter   Received notification from Patient Pharmacy that prior authorization for Basaglar  Kwikpen 100 unit/ml is required/requested.   Insurance verification completed.   The patient is insured through [place] .   Per test claim:  Lantus , Toujeo  or [place] is preferred by the insurance.  If suggested medication is appropriate, Please send in a new RX and discontinue this one. If not, please advise as to why it's not appropriate so that we may request a Prior Authorization. Please note, some preferred medications may still require a PA.  If the suggested medications have not been trialed and there are no contraindications to their use, the PA will not be submitted, as it will not be approved.

## 2023-11-24 ENCOUNTER — Telehealth: Payer: Self-pay | Admitting: Family Medicine

## 2023-11-24 ENCOUNTER — Other Ambulatory Visit (HOSPITAL_COMMUNITY): Payer: Self-pay

## 2023-11-24 NOTE — Telephone Encounter (Signed)
-----   Message from Rollene FORBES Keeling sent at 11/22/2023 11:08 AM EDT ----- Regarding: Please call pt to make appt for BP follow up Hi team,  Can we please call pt to come in for BP follow up with any provider this week? Had elevated BP at recent specialist office and needs follow up- thanks!  Dr Keeling

## 2023-11-24 NOTE — Telephone Encounter (Signed)
 Called to schedule unable to LVM phone cut off on me.  Thanks!

## 2023-11-24 NOTE — Telephone Encounter (Signed)
 Attempted to call pt x2, recent BMP done at neurology office and pt BP noted to be high, no medication changes made at that visit.  Front office staff also attempted to call pt to schedule visit to repeat BP- unable to reach pt by phone or leave VM.  IF patient calls back - Please schedule office visit to adjust BP medications   Rollene Keeling MD

## 2023-11-24 NOTE — Progress Notes (Signed)
 Attempted to call pt x2 to discuss BP control

## 2023-11-27 ENCOUNTER — Other Ambulatory Visit (HOSPITAL_BASED_OUTPATIENT_CLINIC_OR_DEPARTMENT_OTHER): Payer: Self-pay

## 2023-11-27 MED ORDER — INSULIN GLARGINE 100 UNITS/ML SOLOSTAR PEN
19.0000 [IU] | PEN_INJECTOR | Freq: Every day | SUBCUTANEOUS | 1 refills | Status: AC
  Filled 2023-11-27 – 2024-02-02 (×5): qty 15, 78d supply, fill #0

## 2023-11-27 NOTE — Progress Notes (Addendum)
 This encounter was created in error - please disregard.

## 2023-11-27 NOTE — Telephone Encounter (Signed)
 Rx sent for lantus  Laymon JINNY Legions, MD

## 2023-11-28 ENCOUNTER — Ambulatory Visit: Attending: Neurology | Admitting: Physical Therapy

## 2023-11-28 VITALS — BP 197/87 | HR 67

## 2023-11-28 DIAGNOSIS — R2681 Unsteadiness on feet: Secondary | ICD-10-CM | POA: Insufficient documentation

## 2023-11-28 DIAGNOSIS — R2689 Other abnormalities of gait and mobility: Secondary | ICD-10-CM | POA: Insufficient documentation

## 2023-11-28 DIAGNOSIS — M6281 Muscle weakness (generalized): Secondary | ICD-10-CM | POA: Insufficient documentation

## 2023-11-28 DIAGNOSIS — M21371 Foot drop, right foot: Secondary | ICD-10-CM | POA: Insufficient documentation

## 2023-11-28 NOTE — Therapy (Signed)
 OUTPATIENT PHYSICAL THERAPY NEURO TREATMENT   Patient Name: GENIE MIRABAL MRN: [id number] DOB:19-Apr-1951, 73 y.o., male Today's Date: 11/28/2023   PCP: Donah Laymon PARAS, MD REFERRING PROVIDER: Jerri Pfeiffer, MD  END OF SESSION:  PT End of Session - 11/28/23 0934     Visit Number 2    Number of Visits 9   Plus eval   Date for PT Re-Evaluation 12/13/23    Authorization Type [employer] Dual Complete    PT Start Time 0933    PT Stop Time 0943    PT Time Calculation (min) 10 min    Activity Tolerance Treatment limited secondary to medical complications (Comment)   HTN   Behavior During Therapy WFL for tasks assessed/performed           Past Medical History:  Diagnosis Date   Anxiety    Arthritis    COPD (chronic obstructive pulmonary disease) (HCC)    Depression    Diabetes mellitus without complication (HCC)    Diarrhea    History of kidney stones    HTN (hypertension)    Hyperlipidemia    Hypertensive retinopathy    OU   Pneumonia    Retinal detachment    Rheg. RD OD   Routine adult health maintenance 08/30/2023   Sleep apnea    does not use cpap   Stroke [facility])    denies any deficits   Past Surgical History:  Procedure Laterality Date   CATARACT EXTRACTION Bilateral    Dr. Roz [place]     EYE SURGERY Bilateral    Cat Sx OU; RD repair OD   GAS INSERTION Right 03/13/2019   Procedure: INSERTION OF GAS (C3F8) RIGHT EYE;  Surgeon: Valdemar Rogue, MD;  Location: [facility] OR;  Service: Ophthalmology;  Laterality: Right;   LAPAROSCOPIC APPENDECTOMY N/A 10/03/2020   Procedure: APPENDECTOMY LAPAROSCOPIC;  Surgeon: Rubin Calamity, MD;  Location: [facility] OR;  Service: General;  Laterality: N/A;   LASER PHOTO ABLATION Right 03/13/2019   Procedure: LASER PHOTO  ABLATION RIGHT EYE;  Surgeon: Valdemar Rogue, MD;  Location: [facility] OR;  Service: Ophthalmology;  Laterality: Right;   none     PARS PLANA VITRECTOMY Right 03/13/2019   Procedure: 25 GAUGE PARS PLANA VITRECTOMY WITH   INTRAOCULAR LENSE EXPLANTATION RIGHT EYE.;  Surgeon: Valdemar Rogue, MD;  Location: [facility] OR;  Service: Ophthalmology;  Laterality: Right;   PARS PLANA VITRECTOMY Right 06/27/2019   Procedure: PARS PLANA VITRECTOMY WITH 25 GAUGE;  Surgeon: Valdemar Rogue, MD;  Location: [facility] OR;  Service: Ophthalmology;  Laterality: Right;   PHOTOCOAGULATION WITH LASER Left 03/13/2019   Procedure: INDIRECT PHOTOCOAGULATION WITH LASER LEFT EYE;  Surgeon: Valdemar Rogue, MD;  Location: [facility] OR;  Service: Ophthalmology;  Laterality: Left;   PLACEMENT AND SUTURE OF SECONDARY INTRAOCULAR LENS Right 06/27/2019   Procedure: PLACEMENT AND SUTURE OF SECONDARY INTRAOCULAR LENS;  Surgeon: Valdemar Rogue, MD;  Location: [facility] OR;  Service: Ophthalmology;  Laterality: Right;   RETINAL DETACHMENT SURGERY Right 03/13/2019   PPV for repair of rheg. RD - Dr. Rogue Valdemar   Patient Active Problem List   Diagnosis Date Noted   Chronic health problem 10/14/2023   Stroke (HCC) 10/13/2023   Routine adult health maintenance 08/30/2023   Rash and nonspecific skin eruption 12/28/2021   Liver mass 10/13/2020   Kidney mass 05/19/2020   Aortic atherosclerosis (HCC) 03/28/2020   Complex sleep apnea syndrome 01/23/2019   Treatment-emergent central sleep apnea 01/23/2019   Severe obstructive sleep apnea-hypopnea syndrome 01/23/2019  OSA and COPD overlap syndrome (HCC) 01/23/2019   History of stroke 04/04/2016   Type 2 diabetes mellitus (HCC) 04/01/2016   Epigastric hernia 12/01/2015   Allergic rhinitis 10/31/2012   HLD (hyperlipidemia) 09/07/2011   OSA (obstructive sleep apnea) 06/17/2011   Hearing loss 01/17/2011   H/O: asbestos exposure 11/18/2010   Inguinal hernia, right 11/07/2010   Essential hypertension 08/17/2010   Bradycardia 08/17/2010   Tobacco use disorder 08/17/2010    ONSET DATE: 10/15/2023 (referral)   REFERRING DIAG: I63.9 (ICD-10-CM) - Cerebral infarction, unspecified mechanism (HCC)  THERAPY DIAG:  Other abnormalities of  gait and mobility  Muscle weakness (generalized)  Foot drop, right  Unsteadiness on feet  Rationale for Evaluation and Treatment: Rehabilitation  SUBJECTIVE:                                                                                                                                                                                             SUBJECTIVE STATEMENT: Pt presents without AD. Reports he is doing well. Denies falls or acute changes since last visit.    Pt accompanied by: self  PERTINENT HISTORY: left frontal subcortical infarction (09/2020), left basal ganglia/internal capsule infarcts (03/2016) with no significant residual deficit, HTN, HLD, T2DM, former tobacco user, COPD, OSA/central sleep apnea not adherent to CPAP.  PAIN:  Are you having pain? No  PRECAUTIONS: Fall  RED FLAGS: None   WEIGHT BEARING RESTRICTIONS: No  FALLS: Has patient fallen in last 6 months? No and Reports frequent near misses throughout the year   LIVING ENVIRONMENT: Lives with: lives with their spouse Lives in: Mobile home Stairs: Ramped entry  Has following equipment at home: Single point cane  PLOF: Independent  PATIENT GOALS:  Get back to lifting my leg up and not dragging it   OBJECTIVE:  Note: Objective measures were completed at Evaluation unless otherwise noted.  DIAGNOSTIC FINDINGS: MRI of brain from 10/14/23  IMPRESSION: 1. No brain metastases. 2. Severe stenosis of the proximal M1 segment of the left MCA. 3. Moderate stenosis of the left PCA P1-2 junction. 4. No hemodynamically significant stenosis of the internal carotid arteries by NASCET criteria.  MRI of brain from 10/13/23  IMPRESSION: A 2.5 cm area of acute ischemia at the left basal ganglia. No hemorrhage or mass effect.  COGNITION: Overall cognitive status: Difficulty to assess due to: no family present   SENSATION: Pt denies numbness/tingling in all extremities     POSTURE: rounded shoulders,  increased thoracic kyphosis, and weight shift right  LOWER EXTREMITY ROM:     Active  Right Eval Left Eval  Hip flexion  Hip extension    Hip abduction    Hip adduction    Hip internal rotation    Hip external rotation    Knee flexion    Knee extension    Ankle dorsiflexion    Ankle plantarflexion    Ankle inversion    Ankle eversion     (Blank rows = not tested)  LOWER EXTREMITY MMT:  Tested in seated position   MMT Right Eval Left Eval  Hip flexion 4+ 5  Hip extension    Hip abduction 4+ 5  Hip adduction 4+ 5  Hip internal rotation    Hip external rotation    Knee flexion 5 5  Knee extension 4+ 5  Ankle dorsiflexion 4 5  Ankle plantarflexion    Ankle inversion    Ankle eversion    (Blank rows = not tested)  BED MOBILITY:  Not tested Pt reports independence with this   TRANSFERS: Sit to stand: Modified independence  Assistive device utilized: None     Stand to sit: Modified independence  Assistive device utilized: None      RAMP:  Not tested  CURB:  Not tested  STAIRS: Findings: Level of Assistance: Modified independence, Stair Negotiation Technique: Alternating Pattern  with Bilateral Rails, Number of Stairs: 4, Height of Stairs: 6   , and Comments: adequate step clearance of RLE noted  GAIT: Gait pattern: decreased arm swing- Right, decreased stride length, decreased hip/knee flexion- Right, decreased ankle dorsiflexion- Right, shuffling, lateral hip instability, lateral lean- Left, decreased trunk rotation, and poor foot clearance- Right Distance walked: Various clinic distances  Assistive device utilized: None Level of assistance: SBA Comments: Pt slides RLE on ground despite 4/5 R ankle DF and hip flexion  MMT and adequate step clearance noted on stairs. Noted inconsistent mild R knee extension thrust    FUNCTIONAL TESTS:    MCTSIB: Condition 1: Avg of 3 trials: 30 sec, Condition 2: Avg of 3 trials: 30 sec, Condition 3: Avg of 3 trials: 30  sec, Condition 4: Avg of 3 trials: 30 sec (min anterolateral sway to L), and Total Score: 120/120    VITALS  Vitals:   11/28/23 0938 11/28/23 0942  BP: (!) 209/106 (!) 197/87  Pulse: 67 67                                                                                                                               TREATMENT:   Self-care/home management  Assessed vitals (see above) and BP elevated beyond safe limit for therapy. Pt reports he did not take his BP medication this morning. Recommended pt go home to take his medication and to go to the ED if he has any symptoms of CVA (BE FAST). Pt verbalized understanding.    PATIENT EDUCATION: Education details: See above  Person educated: Patient Education method: Medical illustrator Education comprehension: verbalized understanding and needs further education  HOME EXERCISE PROGRAM: To be established  GOALS: Goals reviewed with patient? Yes  STG =LTG due to POC length    LONG TERM GOALS: Target date: 12/06/2023   Pt will be independent with final HEP for improved strength, balance, transfers and gait.  Baseline: not established on eval  Goal status: INITIAL  2.  Pt will improve FGA to 20/30 for decreased fall risk   Baseline: 14/30 Goal status: INITIAL  3.  Pt will improve 5 x STS to less than or equal to 14 seconds to demonstrate improved functional strength and transfer efficiency.   Baseline: 17.62s w/no UE support  Goal status: INITIAL  4.  Pt will improve gait velocity to at least 2.4 ft/s for improved gait efficiency and independence   Baseline: 2.12 ft/s no AD Goal status: INITIAL  5.  Pt will trial various foot-up braces for RLE to determine best option for pt for reduced fall risk  Baseline:  Goal status: INITIAL   ASSESSMENT:  CLINICAL IMPRESSION: Arrived no charge due to HTN.    OBJECTIVE IMPAIRMENTS: Abnormal gait, decreased activity tolerance, decreased balance, decreased cognition,  decreased coordination, decreased endurance, decreased knowledge of condition, decreased knowledge of use of DME, decreased mobility, difficulty walking, decreased strength, decreased safety awareness, and improper body mechanics  ACTIVITY LIMITATIONS: carrying, lifting, bending, transfers, and locomotion level  PARTICIPATION LIMITATIONS: community activity and yard work  PERSONAL FACTORS: Past/current experiences and 1 comorbidity: multiple infarcts are also affecting patient's functional outcome.   REHAB POTENTIAL: Good  CLINICAL DECISION MAKING: Evolving/moderate complexity  EVALUATION COMPLEXITY: Moderate  PLAN:  PT FREQUENCY: 2x/week  PT DURATION: 4 weeks  PLANNED INTERVENTIONS: 97164- PT Re-evaluation, 97750- Physical Performance Testing, 97110-Therapeutic exercises, 97530- Therapeutic activity, [id number]- Neuromuscular re-education, 97535- Self Care, [id number]- Manual therapy, [id number]- Gait training, [phone]- Orthotic Initial, [phone]- Orthotic/Prosthetic subsequent, [phone]- Aquatic Therapy, [phone]- Electrical stimulation (manual), Patient/Family education, Balance training, Stair training, Joint mobilization, Spinal mobilization, and DME instructions  PLAN FOR NEXT SESSION: Check BP. Trial AFOs/foot-up on RLE (pt does not have true foot drop so unsure if he needs bracing, but does have some quad weakness in stance). Establish HEP for improved hip flexor strength, step clearance w/RLE, DF ROM. SciFit for endurance. Trial a cane.    Mandeep Ferch E Preet Perrier, PT, DPT 11/28/2023, 9:49 AM

## 2023-11-30 ENCOUNTER — Telehealth: Payer: Self-pay | Admitting: Physical Therapy

## 2023-11-30 ENCOUNTER — Ambulatory Visit: Admitting: Physical Therapy

## 2023-11-30 NOTE — Telephone Encounter (Signed)
 Called pt and left VM regarding no-show to PT appointment today. Informed pt that this is his second no-show and if he does not present to next scheduled appointment, he will be DC from PT per no-show policy.   Lawerance Matsuo E Renea Schoonmaker, PT, DPT

## 2023-12-02 ENCOUNTER — Other Ambulatory Visit (HOSPITAL_COMMUNITY): Payer: Self-pay

## 2023-12-04 ENCOUNTER — Other Ambulatory Visit (HOSPITAL_COMMUNITY): Payer: Self-pay

## 2023-12-05 ENCOUNTER — Ambulatory Visit: Admitting: Physical Therapy

## 2023-12-06 ENCOUNTER — Encounter: Payer: Self-pay | Admitting: Family Medicine

## 2023-12-07 ENCOUNTER — Other Ambulatory Visit (HOSPITAL_COMMUNITY): Payer: Self-pay

## 2023-12-07 ENCOUNTER — Ambulatory Visit: Admitting: Family Medicine

## 2023-12-07 ENCOUNTER — Ambulatory Visit

## 2023-12-08 ENCOUNTER — Other Ambulatory Visit (HOSPITAL_COMMUNITY): Payer: Self-pay

## 2023-12-11 ENCOUNTER — Other Ambulatory Visit (HOSPITAL_COMMUNITY): Payer: Self-pay

## 2023-12-12 ENCOUNTER — Ambulatory Visit: Admitting: Physician Assistant

## 2023-12-12 ENCOUNTER — Ambulatory Visit: Admitting: Physical Therapy

## 2023-12-12 ENCOUNTER — Other Ambulatory Visit: Payer: Self-pay

## 2023-12-12 ENCOUNTER — Other Ambulatory Visit (HOSPITAL_COMMUNITY): Payer: Self-pay

## 2023-12-12 ENCOUNTER — Ambulatory Visit (INDEPENDENT_AMBULATORY_CARE_PROVIDER_SITE_OTHER): Admitting: Family Medicine

## 2023-12-12 VITALS — BP 116/64 | HR 82 | Wt 181.0 lb

## 2023-12-12 DIAGNOSIS — M546 Pain in thoracic spine: Secondary | ICD-10-CM | POA: Diagnosis not present

## 2023-12-12 DIAGNOSIS — E1169 Type 2 diabetes mellitus with other specified complication: Secondary | ICD-10-CM | POA: Diagnosis not present

## 2023-12-12 DIAGNOSIS — I10 Essential (primary) hypertension: Secondary | ICD-10-CM | POA: Diagnosis not present

## 2023-12-12 DIAGNOSIS — R5383 Other fatigue: Secondary | ICD-10-CM | POA: Diagnosis not present

## 2023-12-12 NOTE — Progress Notes (Signed)
    SUBJECTIVE:   CHIEF COMPLAINT / HPI:   BP Wants to get this checked since it was up to 190s SBP last week.  Fatigue Seen by PCP in July 2025 and obtained multiple labs. Thought could be due to post-stroke. He has missed his recent appointments, and he has been discharged from PT.   Upper back pain No weakness or numbness. Has been going on for a couple days. Has been lying down more than usual though no trauma.  OBJECTIVE:   BP 116/64   Pulse 82   Wt 181 lb (82.1 kg)   SpO2 98%   BMI 29.21 kg/m   General: Alert and oriented, in NAD Skin: Warm, dry, and intact HEENT: NCAT, EOM grossly normal, midline nasal septum Respiratory: Breathing and speaking comfortably on RA Extremities: Moves all extremities grossly equally Neurological: No gross focal deficit, strength and sensation normal throughout upper and lower extremities, gait normal, no midline or paraspinal tenderness Psychiatric: Appropriate mood and affect, PHQ-9 = 6 with negative #9  ASSESSMENT/PLAN:   Assessment & Plan Type 2 diabetes mellitus with other specified complication, without long-term current use of insulin  (HCC) Collected urine microalb/cr ratio today. Other fatigue Will further collect vitamin B12 and vitamin D at future lab visit given no phlebotomy on site given persistent symptoms with otherwise negative workup. Feel low mood and current renal mass (?malignant) could be contributing. He is going to get follow up with urology for further workup of the mass. Thoracic back pain, unspecified back pain laterality, unspecified chronicity Reassuring exam today. Could be due to overuse since this resolves with rest. Will send message to PT about giving him one more appointment since he was discharged due to no-shows. Essential hypertension Controlled. Normal gait. Continue current therapy.   Stuart Redo, MD [facility] [employer]

## 2023-12-12 NOTE — Assessment & Plan Note (Signed)
 Collected urine microalb/cr ratio today.

## 2023-12-12 NOTE — Assessment & Plan Note (Signed)
 Controlled. Normal gait. Continue current therapy.

## 2023-12-12 NOTE — Patient Instructions (Signed)
 BP is great. Keep up the good work.  We will get labs today. Schedule a lab visit to come back and get these soon.  Your back pain may be due to lying down more than usual. If this worsens before you come back and see Dr. Donah, let us  know.

## 2023-12-13 ENCOUNTER — Ambulatory Visit: Payer: Self-pay | Admitting: Family Medicine

## 2023-12-13 LAB — MICROALBUMIN / CREATININE URINE RATIO
Creatinine, Urine: 260.1 mg/dL
Microalb/Creat Ratio: 11 mg/g{creat} (ref 0–29)
Microalbumin, Urine: 29.4 ug/mL

## 2023-12-14 ENCOUNTER — Ambulatory Visit

## 2023-12-19 ENCOUNTER — Ambulatory Visit: Admitting: Physical Therapy

## 2023-12-22 ENCOUNTER — Other Ambulatory Visit

## 2023-12-22 ENCOUNTER — Other Ambulatory Visit (HOSPITAL_COMMUNITY): Payer: Self-pay

## 2023-12-26 ENCOUNTER — Encounter: Payer: Self-pay | Admitting: Family Medicine

## 2023-12-26 ENCOUNTER — Other Ambulatory Visit

## 2023-12-26 ENCOUNTER — Ambulatory Visit: Admitting: Family Medicine

## 2023-12-26 VITALS — BP 151/76 | HR 65 | Ht 66.0 in | Wt 187.0 lb

## 2023-12-26 DIAGNOSIS — Z8673 Personal history of transient ischemic attack (TIA), and cerebral infarction without residual deficits: Secondary | ICD-10-CM | POA: Diagnosis not present

## 2023-12-26 DIAGNOSIS — E1169 Type 2 diabetes mellitus with other specified complication: Secondary | ICD-10-CM | POA: Diagnosis not present

## 2023-12-26 DIAGNOSIS — Z0000 Encounter for general adult medical examination without abnormal findings: Secondary | ICD-10-CM

## 2023-12-26 DIAGNOSIS — I10 Essential (primary) hypertension: Secondary | ICD-10-CM | POA: Diagnosis not present

## 2023-12-26 DIAGNOSIS — Z23 Encounter for immunization: Secondary | ICD-10-CM | POA: Diagnosis not present

## 2023-12-26 LAB — POCT GLYCOSYLATED HEMOGLOBIN (HGB A1C): HbA1c, POC (controlled diabetic range): 7.1 % — AB (ref 0.0–7.0)

## 2023-12-26 NOTE — Patient Instructions (Addendum)
 It was great to see you again today.  Restart hydrochlorothiazide  12.5mg  daily Referring back to physical therapy Flu shot today Keep eye appointment Check blood pressure at home A1c looks good so let's leave insulin  where it is.  Follow up with me in 1 month, sooner if needed  Be well, Dr. Donah

## 2023-12-26 NOTE — Progress Notes (Signed)
  Date of Visit: 12/26/2023   SUBJECTIVE:   HPI:  Discussed the use of AI scribe software for clinical note transcription with the patient, who gave verbal consent to proceed.  History of Present Illness Jonathan [name] is a 73 year old male who presents for a follow-up visit to reschedule physical therapy appointments. He is accompanied by [name], his caregiver.  Physical therapy referral - Missed physical therapy appointments due to transportation issues - Requires new referral for physical therapy - Dragging feet when walking, thinks he would benefit from more PT  Hypertension management - Currently prescribed lisinopril  40 mg daily - Not currently checking blood pressure at home - Did not take blood pressure medication today, took it yesterday - Typically takes blood pressure medication in afternoons  Diabetes mellitus management - Prescribed Lantus  19 units daily - Checks blood sugar daily - Morning blood glucose readings typically in the mid-150s to 160s - No episodes of hypoglycemia - A1c 7.1 today  Preventive care - Due for influenza vaccine, shingles vaccine, and tetanus shot - Eye exam scheduled for Friday at [facility]    OBJECTIVE:   BP (!) 151/76   Pulse 65   Ht 5' 6 (1.676 m)   Wt 187 lb (84.8 kg)   SpO2 99%   BMI 30.18 kg/m  Gen: no acute distress, pleasant cooperative HEENT: normocephalic, atraumatic  Heart: regular rate and rhythm  Lungs: normal work of breathing on room air  Neuro: alert grossly nonfocal, speech normal  ASSESSMENT/PLAN:   Assessment & Plan Type 2 diabetes mellitus with other specified complication, without long-term current use of insulin  (HCC) A1c well controlled Continue present insulin  regimen Eye exam scheduled Routine adult health maintenance Flu shot today Upcoming eye appointment scheduled Will need Tdap at his pharmacy as well as shingrix  Essential hypertension Blood pressure elevated Add hydrochlorothiazide  12.5mg   daily Encouraged to check bps at home, sent in new cuff for wife History of stroke      FOLLOW UP: Follow up in 1 mo for above issues  [place] J. Donah, MD [facility]

## 2023-12-28 ENCOUNTER — Other Ambulatory Visit (HOSPITAL_COMMUNITY): Payer: Self-pay

## 2023-12-28 ENCOUNTER — Other Ambulatory Visit: Payer: Self-pay | Admitting: Family Medicine

## 2023-12-28 NOTE — Assessment & Plan Note (Signed)
 Flu shot today Upcoming eye appointment scheduled Will need Tdap at his pharmacy as well as shingrix 

## 2023-12-28 NOTE — Assessment & Plan Note (Signed)
 Blood pressure elevated Add hydrochlorothiazide  12.5mg  daily Encouraged to check bps at home, sent in new cuff for wife

## 2023-12-28 NOTE — Assessment & Plan Note (Signed)
 A1c well controlled Continue present insulin  regimen Eye exam scheduled

## 2023-12-29 ENCOUNTER — Other Ambulatory Visit (HOSPITAL_COMMUNITY): Payer: Self-pay

## 2023-12-29 ENCOUNTER — Other Ambulatory Visit: Payer: Self-pay

## 2024-01-01 ENCOUNTER — Other Ambulatory Visit (HOSPITAL_COMMUNITY): Payer: Self-pay

## 2024-01-01 ENCOUNTER — Other Ambulatory Visit: Payer: Self-pay | Admitting: Family Medicine

## 2024-01-02 ENCOUNTER — Other Ambulatory Visit (HOSPITAL_COMMUNITY): Payer: Self-pay

## 2024-01-02 ENCOUNTER — Encounter (HOSPITAL_COMMUNITY): Payer: Self-pay

## 2024-01-03 ENCOUNTER — Other Ambulatory Visit (HOSPITAL_COMMUNITY): Payer: Self-pay

## 2024-01-04 ENCOUNTER — Telehealth: Payer: Self-pay

## 2024-01-04 ENCOUNTER — Other Ambulatory Visit (HOSPITAL_COMMUNITY): Payer: Self-pay

## 2024-01-04 MED ORDER — ACCU-CHEK SOFTCLIX LANCETS MISC
12 refills | Status: AC
  Filled 2024-01-04: qty 100, 33d supply, fill #0

## 2024-01-04 MED ORDER — ACCU-CHEK GUIDE TEST VI STRP
ORAL_STRIP | 12 refills | Status: AC
  Filled 2024-01-04: qty 100, 33d supply, fill #0

## 2024-01-04 MED ORDER — ACCU-CHEK GUIDE W/DEVICE KIT
PACK | 0 refills | Status: AC
  Filled 2024-01-04: qty 1, 30d supply, fill #0

## 2024-01-04 NOTE — Telephone Encounter (Signed)
 Rx sent Latrelle Dodrill, MD

## 2024-01-04 NOTE — Telephone Encounter (Signed)
 [name] calls nurse line requesting DM supplies.   She reports his insurance is now covering Accu Chek Guide and she is needing new prescriptions.   Will forward to PCP.

## 2024-01-05 ENCOUNTER — Other Ambulatory Visit (HOSPITAL_COMMUNITY): Payer: Self-pay

## 2024-01-05 ENCOUNTER — Other Ambulatory Visit: Payer: Self-pay

## 2024-01-12 ENCOUNTER — Ambulatory Visit: Admitting: Gastroenterology

## 2024-01-12 ENCOUNTER — Other Ambulatory Visit: Payer: Self-pay | Admitting: *Deleted

## 2024-01-12 DIAGNOSIS — R5383 Other fatigue: Secondary | ICD-10-CM

## 2024-01-12 NOTE — Progress Notes (Deleted)
 Chief Complaint:Incontinence of feces, unspecified fecal incontinence type  Primary GI Doctor:***  HPI:  Patient is a  73  year old male patient with past medical history of stroke, COPD, DM, anxiety and depression,*****who was referred to me by Donah Laymon PARAS, MD on 07/03/23 for a evaluation of Incontinence of feces, unspecified fecal incontinence type  .    Patient recently discharged on 10/15/2023 after admission for stroke. Had an ischemic stroke in the basal ganglia due to severe stenosis of M1 of L MCA.   10/23/23 Urology for consult about imaging which shows 3 by 3 x 3 x 6 right renal mass concerning for neoplasm.   Interval History  Patient admits/denies GERD Patient admits/denies dysphagia Patient admits/denies nausea, vomiting, or weight loss  Patient admits/denies altered bowel habits Patient admits/denies abdominal pain Patient admits/denies rectal bleeding   Denies/Admits alcohol Denies/Admits smoking Denies/Admits NSAID use. Denies/Admits they are on blood thinners.  Patients last colonoscopy Patients last EGD  Surgical history:  Patient's family history includes  Wt Readings from Last 3 Encounters:  12/26/23 187 lb (84.8 kg)  12/12/23 181 lb (82.1 kg)  11/21/23 191 lb (86.6 kg)      Past Medical History:  Diagnosis Date   Anxiety    Arthritis    COPD (chronic obstructive pulmonary disease) (HCC)    Depression    Diabetes mellitus without complication (HCC)    Diarrhea    History of kidney stones    HTN (hypertension)    Hyperlipidemia    Hypertensive retinopathy    OU   Pneumonia    Retinal detachment    Rheg. RD OD   Routine adult health maintenance 08/30/2023   Sleep apnea    does not use cpap   Stroke [facility])    denies any deficits    Past Surgical History:  Procedure Laterality Date   CATARACT EXTRACTION Bilateral    Dr. Roz INFIELD     EYE SURGERY Bilateral    Cat Sx OU; RD repair OD   GAS INSERTION Right  03/13/2019   Procedure: INSERTION OF GAS (C3F8) RIGHT EYE;  Surgeon: Valdemar Rogue, MD;  Location: [facility] OR;  Service: Ophthalmology;  Laterality: Right;   LAPAROSCOPIC APPENDECTOMY N/A 10/03/2020   Procedure: APPENDECTOMY LAPAROSCOPIC;  Surgeon: Rubin Calamity, MD;  Location: [facility] OR;  Service: General;  Laterality: N/A;   LASER PHOTO ABLATION Right 03/13/2019   Procedure: LASER PHOTO  ABLATION RIGHT EYE;  Surgeon: Valdemar Rogue, MD;  Location: [facility] OR;  Service: Ophthalmology;  Laterality: Right;   none     PARS PLANA VITRECTOMY Right 03/13/2019   Procedure: 25 GAUGE PARS PLANA VITRECTOMY WITH  INTRAOCULAR LENSE EXPLANTATION RIGHT EYE.;  Surgeon: Valdemar Rogue, MD;  Location: [facility] OR;  Service: Ophthalmology;  Laterality: Right;   PARS PLANA VITRECTOMY Right 06/27/2019   Procedure: PARS PLANA VITRECTOMY WITH 25 GAUGE;  Surgeon: Valdemar Rogue, MD;  Location: [facility] OR;  Service: Ophthalmology;  Laterality: Right;   PHOTOCOAGULATION WITH LASER Left 03/13/2019   Procedure: INDIRECT PHOTOCOAGULATION WITH LASER LEFT EYE;  Surgeon: Valdemar Rogue, MD;  Location: [facility] OR;  Service: Ophthalmology;  Laterality: Left;   PLACEMENT AND SUTURE OF SECONDARY INTRAOCULAR LENS Right 06/27/2019   Procedure: PLACEMENT AND SUTURE OF SECONDARY INTRAOCULAR LENS;  Surgeon: Valdemar Rogue, MD;  Location: [facility] OR;  Service: Ophthalmology;  Laterality: Right;   RETINAL DETACHMENT SURGERY Right 03/13/2019   PPV for repair of rheg. RD - Dr. Rogue Valdemar    Current  Outpatient Medications  Medication Sig Dispense Refill   Accu-Chek Softclix Lancets lancets Use to check blood sugar 3x per day. E11.9 100 each 12   albuterol  (VENTOLIN  HFA) 108 (90 Base) MCG/ACT inhaler Inhale 2 puffs into the lungs every 6 (six) hours as needed. 6.7 g 2   aspirin  EC 81 MG tablet Take 1 tablet (81 mg total) by mouth daily. Swallow whole. 30 tablet 2   atorvastatin  (LIPITOR ) 80 MG tablet Take 1 tablet (80 mg total) by mouth daily. 30 tablet 0   Blood Glucose  Monitoring Suppl (ACCU-CHEK GUIDE) w/Device KIT Use to check blood sugar 3x per day. E11.9 1 kit 0   Blood Pressure Monitoring (3 SERIES BP MONITOR/UPPER ARM) DEVI Check blood pressure once a day 1 each 0   buPROPion  (WELLBUTRIN  XL) 300 MG 24 hr tablet Take 1 tablet (300 mg total) by mouth every morning. 90 tablet 1   cetirizine  (ZYRTEC ) 10 MG tablet Take 1 tablet (10 mg total) by mouth daily. 90 tablet 1   cilostazol  (PLETAL ) 100 MG tablet Take 1 tablet by mouth twice daily 180 tablet 1   fluticasone  (FLONASE ) 50 MCG/ACT nasal spray Place 1 spray into both nostrils daily. 16 g 2   glucose blood (ACCU-CHEK GUIDE TEST) test strip Use to check blood sugar 3x per day. E11.9 100 each 12   insulin  glargine (LANTUS ) 100 unit/mL SOPN Inject 19 Units into the skin daily. 15 mL 1   ipratropium (ATROVENT ) 0.03 % nasal spray Place 2 sprays into both nostrils every 12 (twelve) hours. 30 mL 3   Lancet Devices (ONE TOUCH DELICA LANCING DEV) MISC Use to check blood sugar 3 times daily 1 each 0   lisinopril  (ZESTRIL ) 40 MG tablet Take 1 tablet (40 mg total) by mouth daily. 90 tablet 1   Zoster Vaccine Adjuvanted (SHINGRIX ) injection Administer Shingrix  vaccination now and repeat in two months 1 each 1   No current facility-administered medications for this visit.    Allergies as of 01/12/2024   (No Known Allergies)    Family History  Problem Relation Age of Onset   Cancer Mother        brain tumor   Heart disease Father    Cirrhosis Father     Review of Systems:    Constitutional: No weight loss, fever, chills, weakness or fatigue HEENT: Eyes: No change in vision               Ears, Nose, Throat:  No change in hearing or congestion Skin: No rash or itching Cardiovascular: No chest pain, chest pressure or palpitations   Respiratory: No SOB or cough Gastrointestinal: See HPI and otherwise negative Genitourinary: No dysuria or change in urinary frequency Neurological: No headache, dizziness or  syncope Musculoskeletal: No new muscle or joint pain Hematologic: No bleeding or bruising Psychiatric: No history of depression or anxiety    Physical Exam:  Vital signs: There were no vitals taken for this visit.  Constitutional:   Pleasant *** male/male appears to be in NAD, Well developed, Well nourished, alert and cooperative Eyes:   PEERL, EOMI. No icterus. Conjunctiva pink. Neck:  Supple Throat: Oral cavity and pharynx without inflammation, swelling or lesion.  Respiratory: Respirations even and unlabored. Lungs clear to auscultation bilaterally.   No wheezes, crackles, or rhonchi.  Cardiovascular: Normal S1, S2. Regular rate and rhythm. No peripheral edema, cyanosis or pallor.  Gastrointestinal:  Soft, nondistended, nontender. No rebound or guarding. Normal bowel sounds. No appreciable masses or  hepatomegaly. Rectal:  Not performed.  Anoscopy: Msk:  Symmetrical without gross deformities. Without edema, no deformity or joint abnormality.  Neurologic:  Alert and  oriented x4;  grossly normal neurologically.  Skin:   Dry and intact without significant lesions or rashes.  RELEVANT LABS AND IMAGING: CBC    Latest Ref Rng & Units 10/31/2023    4:58 PM 10/13/2023    4:33 PM 11/14/2022    2:34 PM  CBC  WBC 3.4 - 10.8 x10E3/uL 9.5  7.4  5.7   Hemoglobin 13.0 - 17.7 g/dL 84.6  86.8  84.4   Hematocrit 37.5 - 51.0 % 43.7  39.6  47.5   Platelets 150 - 450 x10E3/uL 376  272  228      CMP     Latest Ref Rng & Units 11/21/2023   10:35 AM 10/31/2023    4:58 PM 10/15/2023    5:59 AM  CMP  Glucose 70 - 99 mg/dL 841  887  892   BUN 8 - 27 mg/dL 16  18  18    Creatinine 0.76 - 1.27 mg/dL 8.52  8.21  8.29   Sodium 134 - 144 mmol/L 138  142  136   Potassium 3.5 - 5.2 mmol/L 3.9  4.9  3.8   Chloride 96 - 106 mmol/L 106  108  113   CO2 20 - 29 mmol/L 20  15  18    Calcium  8.6 - 10.2 mg/dL 9.0  9.6  8.0      Lab Results  Component Value Date   TSH 1.572 10/14/2023     Assessment: 1.  ***  Plan: 1. ***   Thank you for the courtesy of this consult. Please call me with any questions or concerns.   Holden Maniscalco, FNP-C [facility] 01/12/2024, 7:00 AM  Cc: Donah Laymon PARAS, MD

## 2024-01-13 LAB — VITAMIN B12: Vitamin B-12: 183 pg/mL — ABNORMAL LOW (ref 232–1245)

## 2024-01-13 LAB — VITAMIN D 25 HYDROXY (VIT D DEFICIENCY, FRACTURES): Vit D, 25-Hydroxy: 14.3 ng/mL — ABNORMAL LOW (ref 30.0–100.0)

## 2024-01-14 ENCOUNTER — Other Ambulatory Visit (HOSPITAL_COMMUNITY): Payer: Self-pay

## 2024-01-14 MED ORDER — VITAMIN D (CHOLECALCIFEROL) 25 MCG (1000 UT) PO TABS
1.0000 | ORAL_TABLET | Freq: Every day | ORAL | 0 refills | Status: AC
  Filled 2024-01-14: qty 90, 90d supply, fill #0

## 2024-01-14 MED ORDER — VITAMIN B-12 1000 MCG PO TABS
1000.0000 ug | ORAL_TABLET | Freq: Every day | ORAL | 0 refills | Status: AC
  Filled 2024-01-14: qty 90, 90d supply, fill #0

## 2024-01-14 NOTE — Progress Notes (Signed)
 Noted low vitamin B12 and D levels. Likely contributing to fatigue. Will send in vitamin B12 1000 mcg daily along with vitamin D  1000 international units daily.  Recommend scheduling follow up in 3 months for follow up with PCP Dr. Donah.

## 2024-01-15 ENCOUNTER — Other Ambulatory Visit (HOSPITAL_COMMUNITY): Payer: Self-pay

## 2024-01-15 ENCOUNTER — Other Ambulatory Visit: Payer: Self-pay

## 2024-01-23 DIAGNOSIS — N2889 Other specified disorders of kidney and ureter: Secondary | ICD-10-CM | POA: Diagnosis not present

## 2024-01-26 ENCOUNTER — Encounter: Payer: Self-pay | Admitting: Pharmacist

## 2024-01-26 NOTE — Progress Notes (Signed)
 This patient is appearing on a report for being at risk of failing the adherence measure for cholesterol (statin) medications this calendar year.   Medication: atrovastatin 80 mg Last fill date: 12/02/23 for 90 day supply  Insurance report was not up to date. No action needed at this time.

## 2024-01-30 ENCOUNTER — Ambulatory Visit: Admitting: Student

## 2024-02-02 ENCOUNTER — Other Ambulatory Visit: Payer: Self-pay

## 2024-02-02 ENCOUNTER — Other Ambulatory Visit (HOSPITAL_COMMUNITY): Payer: Self-pay

## 2024-02-06 ENCOUNTER — Ambulatory Visit: Admitting: Physical Therapy

## 2024-02-09 ENCOUNTER — Ambulatory Visit: Admitting: Physical Therapy

## 2024-02-09 NOTE — Therapy (Incomplete)
 OUTPATIENT PHYSICAL THERAPY NEURO EVALUATION   Patient Name: Jonathan [name] MRN: [id number] DOB:1950-07-26, 73 y.o., male Today's Date: 02/09/2024   PCP: Donah Laymon PARAS, MD REFERRING PROVIDER: Donah Laymon PARAS, MD  END OF SESSION:   Past Medical History:  Diagnosis Date   Anxiety    Arthritis    COPD (chronic obstructive pulmonary disease) (HCC)    Depression    Diabetes mellitus without complication (HCC)    Diarrhea    History of kidney stones    HTN (hypertension)    Hyperlipidemia    Hypertensive retinopathy    OU   Pneumonia    Retinal detachment    Rheg. RD OD   Routine adult health maintenance 08/30/2023   Sleep apnea    does not use cpap   Stroke [facility])    denies any deficits   Past Surgical History:  Procedure Laterality Date   CATARACT EXTRACTION Bilateral    Dr. Roz [place]     EYE SURGERY Bilateral    Cat Sx OU; RD repair OD   GAS INSERTION Right 03/13/2019   Procedure: INSERTION OF GAS (C3F8) RIGHT EYE;  Surgeon: Valdemar Rogue, MD;  Location: [facility] OR;  Service: Ophthalmology;  Laterality: Right;   LAPAROSCOPIC APPENDECTOMY N/A 10/03/2020   Procedure: APPENDECTOMY LAPAROSCOPIC;  Surgeon: Rubin Calamity, MD;  Location: [facility] OR;  Service: General;  Laterality: N/A;   LASER PHOTO ABLATION Right 03/13/2019   Procedure: LASER PHOTO  ABLATION RIGHT EYE;  Surgeon: Valdemar Rogue, MD;  Location: [facility] OR;  Service: Ophthalmology;  Laterality: Right;   none     PARS PLANA VITRECTOMY Right 03/13/2019   Procedure: 25 GAUGE PARS PLANA VITRECTOMY WITH  INTRAOCULAR LENSE EXPLANTATION RIGHT EYE.;  Surgeon: Valdemar Rogue, MD;  Location: [facility] OR;  Service: Ophthalmology;  Laterality: Right;   PARS PLANA VITRECTOMY Right 06/27/2019   Procedure: PARS PLANA VITRECTOMY WITH 25 GAUGE;  Surgeon: Valdemar Rogue, MD;  Location: [facility] OR;  Service: Ophthalmology;  Laterality: Right;   PHOTOCOAGULATION WITH LASER Left 03/13/2019   Procedure: INDIRECT PHOTOCOAGULATION WITH  LASER LEFT EYE;  Surgeon: Valdemar Rogue, MD;  Location: [facility] OR;  Service: Ophthalmology;  Laterality: Left;   PLACEMENT AND SUTURE OF SECONDARY INTRAOCULAR LENS Right 06/27/2019   Procedure: PLACEMENT AND SUTURE OF SECONDARY INTRAOCULAR LENS;  Surgeon: Valdemar Rogue, MD;  Location: [facility] OR;  Service: Ophthalmology;  Laterality: Right;   RETINAL DETACHMENT SURGERY Right 03/13/2019   PPV for repair of rheg. RD - Dr. Rogue Valdemar   Patient Active Problem List   Diagnosis Date Noted   Chronic health problem 10/14/2023   Stroke (HCC) 10/13/2023   Routine adult health maintenance 08/30/2023   Rash and nonspecific skin eruption 12/28/2021   Liver mass 10/13/2020   Kidney mass 05/19/2020   Aortic atherosclerosis 03/28/2020   Complex sleep apnea syndrome 01/23/2019   Treatment-emergent central sleep apnea 01/23/2019   Severe obstructive sleep apnea-hypopnea syndrome 01/23/2019   OSA and COPD overlap syndrome (HCC) 01/23/2019   History of stroke 04/04/2016   Type 2 diabetes mellitus (HCC) 04/01/2016   Epigastric hernia 12/01/2015   Allergic rhinitis 10/31/2012   HLD (hyperlipidemia) 09/07/2011   OSA (obstructive sleep apnea) 06/17/2011   Hearing loss 01/17/2011   H/O: asbestos exposure 11/18/2010   Inguinal hernia, right 11/07/2010   Essential hypertension 08/17/2010   Bradycardia 08/17/2010   Tobacco use disorder 08/17/2010    ONSET DATE: 12/28/2023  REFERRING DIAG: Z86.73 (ICD-10-CM) - History of stroke  THERAPY DIAG:  No diagnosis found.  Rationale for Evaluation and Treatment: Rehabilitation  SUBJECTIVE:                                                                                                                                                                                             SUBJECTIVE STATEMENT: ***  Pt accompanied by: {accompnied:27141}  PERTINENT HISTORY: PMH: left frontal subcortical infarction (09/2020), left basal ganglia/internal capsule infarcts (03/2016)  with no significant residual deficit, HTN, HLD, T2DM, former tobacco user, COPD, OSA/central sleep apnea not adherent to CPAP.  PAIN:  Are you having pain? {OPRCPAIN:27236}  PRECAUTIONS: {Therapy precautions:24002}  RED FLAGS: {PT Red Flags:29287}   WEIGHT BEARING RESTRICTIONS: {Yes ***/No:24003}  FALLS: Has patient fallen in last 6 months? {fallsyesno:27318}  LIVING ENVIRONMENT: Lives with: {OPRC lives with:25569::lives with their family} Lives in: {Lives in:25570} Stairs: {opstairs:27293} Has following equipment at home: {Assistive devices:23999}  PLOF: {PLOF:24004}  PATIENT GOALS: ***  OBJECTIVE:  Note: Objective measures were completed at Evaluation unless otherwise noted.  DIAGNOSTIC FINDINGS: ***  MRI of brain from 10/14/23   IMPRESSION: 1. No brain metastases. 2. Severe stenosis of the proximal M1 segment of the left MCA. 3. Moderate stenosis of the left PCA P1-2 junction. 4. No hemodynamically significant stenosis of the internal carotid arteries by NASCET criteria.   MRI of brain from 10/13/23   IMPRESSION: A 2.5 cm area of acute ischemia at the left basal ganglia. No hemorrhage or mass effect.  COGNITION: Overall cognitive status: {cognition:24006}   SENSATION: {sensation:27233}  COORDINATION: ***  EDEMA:  {edema:24020}  MUSCLE TONE: {LE tone:25568}  MUSCLE LENGTH: Hamstrings: Right *** deg; Left *** deg Debby test: Right *** deg; Left *** deg  DTRs:  {DTR SITE:24025}  POSTURE: {posture:25561}  LOWER EXTREMITY ROM:     {AROM/PROM:27142}  Right Eval Left Eval  Hip flexion    Hip extension    Hip abduction    Hip adduction    Hip internal rotation    Hip external rotation    Knee flexion    Knee extension    Ankle dorsiflexion    Ankle plantarflexion    Ankle inversion    Ankle eversion     (Blank rows = not tested)  LOWER EXTREMITY MMT:    MMT Right Eval Left Eval  Hip flexion    Hip extension    Hip abduction     Hip adduction    Hip internal rotation    Hip external rotation    Knee flexion    Knee extension    Ankle dorsiflexion    Ankle plantarflexion    Ankle inversion  Ankle eversion    (Blank rows = not tested)  BED MOBILITY:  {bed mobility:32615:p}  TRANSFERS: {transfers eval:32620}  RAMP:  {ramp eval:32616}  CURB:  {curb eval:32617}  STAIRS: {stairs eval:32618} GAIT: Findings: {GaitneuroPT:32644::Distance walked: ***,Comments: ***}  FUNCTIONAL TESTS:  {Functional tests:24029}  PATIENT SURVEYS:  {rehab surveys:24030}                                                                                                                              TREATMENT DATE: *** PT Evaluation   PATIENT EDUCATION: Education details: *** Person educated: {Person educated:25204} Education method: {Education Method:25205} Education comprehension: {Education Comprehension:25206}  HOME EXERCISE PROGRAM: ***  GOALS: Goals reviewed with patient? {yes/no:20286}  SHORT TERM GOALS: Target date: ***  *** Baseline: Goal status: INITIAL  2.  *** Baseline:  Goal status: INITIAL  3.  *** Baseline:  Goal status: INITIAL  4.  *** Baseline:  Goal status: INITIAL  5.  *** Baseline:  Goal status: INITIAL  6.  *** Baseline:  Goal status: INITIAL  LONG TERM GOALS: Target date: ***  *** Baseline:  Goal status: INITIAL  2.  *** Baseline:  Goal status: INITIAL  3.  *** Baseline:  Goal status: INITIAL  4.  *** Baseline:  Goal status: INITIAL  5.  *** Baseline:  Goal status: INITIAL  6.  *** Baseline:  Goal status: INITIAL  ASSESSMENT:  CLINICAL IMPRESSION: Patient is a *** year old *** referred to Neuro OPPT for***.   Pt's PMH is significant for: *** The following deficits were present during the exam: ***. Based on ***, pt is an incr risk for falls. Pt would benefit from skilled PT to address these impairments and functional limitations to maximize  functional mobility independence   OBJECTIVE IMPAIRMENTS: {opptimpairments:25111}.   ACTIVITY LIMITATIONS: {activitylimitations:27494}  PARTICIPATION LIMITATIONS: {participationrestrictions:25113}  PERSONAL FACTORS: {Personal factors:25162} are also affecting patient's functional outcome.   REHAB POTENTIAL: {rehabpotential:25112}  CLINICAL DECISION MAKING: {clinical decision making:25114}  EVALUATION COMPLEXITY: {Evaluation complexity:25115}  PLAN:  PT FREQUENCY: {rehab frequency:25116}  PT DURATION: {rehab duration:25117}  PLANNED INTERVENTIONS: {rehab planned interventions:25118::97110-Therapeutic exercises,97530- Therapeutic [phone]- Neuromuscular re-education,97535- Self Rjmz,02859- Manual therapy,Patient/Family education}  PLAN FOR NEXT SESSION: ***   Waddell Southgate, PT Waddell Southgate, PT, DPT, CSRS  02/09/2024, 8:29 AM

## 2024-02-12 ENCOUNTER — Other Ambulatory Visit (HOSPITAL_COMMUNITY): Payer: Self-pay

## 2024-02-16 ENCOUNTER — Ambulatory Visit: Attending: Neurology | Admitting: Physical Therapy

## 2024-02-16 VITALS — BP 134/81 | HR 61

## 2024-02-16 DIAGNOSIS — R2689 Other abnormalities of gait and mobility: Secondary | ICD-10-CM | POA: Diagnosis present

## 2024-02-16 DIAGNOSIS — M6281 Muscle weakness (generalized): Secondary | ICD-10-CM | POA: Diagnosis present

## 2024-02-16 DIAGNOSIS — R2681 Unsteadiness on feet: Secondary | ICD-10-CM | POA: Insufficient documentation

## 2024-02-16 DIAGNOSIS — M21371 Foot drop, right foot: Secondary | ICD-10-CM | POA: Insufficient documentation

## 2024-02-16 DIAGNOSIS — Z8673 Personal history of transient ischemic attack (TIA), and cerebral infarction without residual deficits: Secondary | ICD-10-CM | POA: Diagnosis not present

## 2024-02-16 NOTE — Therapy (Signed)
 OUTPATIENT PHYSICAL THERAPY NEURO EVALUATION   Patient Name: Jonathan [name] MRN: [id number] DOB:28-Oct-1950, 73 y.o., male Today's Date: 02/19/2024   PCP: Donah Laymon PARAS, MD REFERRING PROVIDER: Donah Laymon PARAS, MD  END OF SESSION:    Past Medical History:  Diagnosis Date   Anxiety    Arthritis    COPD (chronic obstructive pulmonary disease) (HCC)    Depression    Diabetes mellitus without complication (HCC)    Diarrhea    History of kidney stones    HTN (hypertension)    Hyperlipidemia    Hypertensive retinopathy    OU   Pneumonia    Retinal detachment    Rheg. RD OD   Routine adult health maintenance 08/30/2023   Sleep apnea    does not use cpap   Stroke [facility])    denies any deficits   Past Surgical History:  Procedure Laterality Date   CATARACT EXTRACTION Bilateral    Dr. Roz [place]     EYE SURGERY Bilateral    Cat Sx OU; RD repair OD   GAS INSERTION Right 03/13/2019   Procedure: INSERTION OF GAS (C3F8) RIGHT EYE;  Surgeon: Valdemar Rogue, MD;  Location: [facility] OR;  Service: Ophthalmology;  Laterality: Right;   LAPAROSCOPIC APPENDECTOMY N/A 10/03/2020   Procedure: APPENDECTOMY LAPAROSCOPIC;  Surgeon: Rubin Calamity, MD;  Location: [facility] OR;  Service: General;  Laterality: N/A;   LASER PHOTO ABLATION Right 03/13/2019   Procedure: LASER PHOTO  ABLATION RIGHT EYE;  Surgeon: Valdemar Rogue, MD;  Location: [facility] OR;  Service: Ophthalmology;  Laterality: Right;   none     PARS PLANA VITRECTOMY Right 03/13/2019   Procedure: 25 GAUGE PARS PLANA VITRECTOMY WITH  INTRAOCULAR LENSE EXPLANTATION RIGHT EYE.;  Surgeon: Valdemar Rogue, MD;  Location: [name] OR;  Service: Ophthalmology;  Laterality: Right;   PARS PLANA VITRECTOMY Right 06/27/2019   Procedure: PARS PLANA VITRECTOMY WITH 25 GAUGE;  Surgeon: Valdemar Rogue, MD;  Location: [facility] OR;  Service: Ophthalmology;  Laterality: Right;   PHOTOCOAGULATION WITH LASER Left 03/13/2019   Procedure: INDIRECT PHOTOCOAGULATION  WITH LASER LEFT EYE;  Surgeon: Valdemar Rogue, MD;  Location: [facility] OR;  Service: Ophthalmology;  Laterality: Left;   PLACEMENT AND SUTURE OF SECONDARY INTRAOCULAR LENS Right 06/27/2019   Procedure: PLACEMENT AND SUTURE OF SECONDARY INTRAOCULAR LENS;  Surgeon: Valdemar Rogue, MD;  Location: [facility] OR;  Service: Ophthalmology;  Laterality: Right;   RETINAL DETACHMENT SURGERY Right 03/13/2019   PPV for repair of rheg. RD - Dr. Rogue Valdemar   Patient Active Problem List   Diagnosis Date Noted   Chronic health problem 10/14/2023   Stroke (HCC) 10/13/2023   Routine adult health maintenance 08/30/2023   Rash and nonspecific skin eruption 12/28/2021   Liver mass 10/13/2020   Kidney mass 05/19/2020   Aortic atherosclerosis 03/28/2020   Complex sleep apnea syndrome 01/23/2019   Treatment-emergent central sleep apnea 01/23/2019   Severe obstructive sleep apnea-hypopnea syndrome 01/23/2019   OSA and COPD overlap syndrome (HCC) 01/23/2019   History of stroke 04/04/2016   Type 2 diabetes mellitus (HCC) 04/01/2016   Epigastric hernia 12/01/2015   Allergic rhinitis 10/31/2012   HLD (hyperlipidemia) 09/07/2011   OSA (obstructive sleep apnea) 06/17/2011   Hearing loss 01/17/2011   H/O: asbestos exposure 11/18/2010   Inguinal hernia, right 11/07/2010   Essential hypertension 08/17/2010   Bradycardia 08/17/2010   Tobacco use disorder 08/17/2010    ONSET DATE: 12/28/2023  REFERRING DIAG: Z86.73 (ICD-10-CM) - History of stroke  THERAPY DIAG:  Other abnormalities of gait and mobility  Muscle weakness (generalized)  Unsteadiness on feet  Foot drop, right  Rationale for Evaluation and Treatment: Rehabilitation  SUBJECTIVE:                                                                                                                                                                                             SUBJECTIVE STATEMENT: Patient states things are going well and has had some falls since coming  here before. The most recent fall being about 3 months ago and denies hitting head. Patient states that sometimes when he gets out of bed that's when he falls and stumbles. He has a shower that is hard to get in and out of sometimes because of the threshold. He has to grab on to the walls and use the walls to get in and out of the shower. Patient drove themselves here and states they have been driving themselves around.   Pt accompanied by: self  PERTINENT HISTORY: PMH: left frontal subcortical infarction (09/2020), left basal ganglia/internal capsule infarcts (03/2016) with no significant residual deficit, HTN, HLD, T2DM, former tobacco user, COPD, OSA/central sleep apnea not adherent to CPAP.   PRECAUTIONS: Fall  FALLS: Has patient fallen in last 6 months? Yes. Number of falls pt states multiple  LIVING ENVIRONMENT: Lives with: lives with their family and lives with their spouse Lives in: Mobile home Stairs: No Has following equipment at home: Single point cane and Environmental [employer]ant - 4 wheeled  PLOF: Independent with basic ADLs  PATIENT GOALS: Wants the right leg to move better, fishing  OBJECTIVE:  Note: Objective measures were completed at Evaluation unless otherwise noted.  DIAGNOSTIC FINDINGS:  MRI of brain from 10/14/23   IMPRESSION: 1. No brain metastases. 2. Severe stenosis of the proximal M1 segment of the left MCA. 3. Moderate stenosis of the left PCA P1-2 junction. 4. No hemodynamically significant stenosis of the internal carotid arteries by NASCET criteria.   MRI of brain from 10/13/23   IMPRESSION: A 2.5 cm area of acute ischemia at the left basal ganglia. No hemorrhage or mass effect.  COGNITION: Overall cognitive status: Within functional limits for tasks assessed  COORDINATION: Heel to shin, normal coordination  POSTURE: rounded shoulders and forward head  LOWER EXTREMITY MMT:    MMT Right Eval Left Eval  Hip flexion 4+ 4+  Hip extension    Hip abduction 4+  4+  Hip adduction 4+ 4+  Hip internal rotation    Hip external rotation    Knee flexion    Knee extension    Ankle dorsiflexion 4+ 4+  Ankle plantarflexion  Ankle inversion    Ankle eversion    (Blank rows = not tested)  STAIRS: Findings: Comments: reciprocal stair negotiation, R drop foot with toe drag during stair negotiation GAIT: Findings: Comments: clinic distance, 48m gait speed and tug test, some R drop foot with toe drag during ambulation  FUNCTIONAL TESTS:  5 times sit to stand: 18.06 seconds (RPE 3/10) Timed up and go (TUG): 13.13 seconds 10 meter walk test: 13.66 seconds, 2.40 ft/second Functional gait assessment:  FUNCTIONAL GAIT ASSESSMENT  Date: 02/16/2024 Score  GAIT LEVEL SURFACE Instructions: Walk at your normal speed from here to the next mark (6 m) [20 ft]. (1) Moderate impairment-Walks 6 m (20 ft), slow speed, abnormal gait pattern, evidence for imbalance, or deviates 25.4 - 38.1 cm (10 -15 in) outside of the 30.48-cm (12-in) walkway width. Requires more than 7 seconds to ambulate 6 m (20 ft).  2.   CHANGE IN GAIT SPEED Instructions: Begin walking at your normal pace (for 1.5 m [5 ft]). When I tell you "go," walk as fast as you can (for 1.5 m [5 ft]). When I tell you "slow," walk as slowly as you can (for 1.5 m [5 ft]. (2) Mild impairment - Is able to change speed but demonstrates mild gait deviations, deviates 15.24 -25.4 cm (6 -10 in) outside of the 30.48-cm (12-in) walkway width, or no gait deviations but unable to achieve a significant change in velocity, or uses an assistive device  3.    GAIT WITH HORIZONTAL HEAD TURNS Instructions: Walk from here to the next mark 6 m (20 ft) away. Begin walking at your normal pace. Keep walking straight; after 3 steps, turn your head to the right and keep walking straight while looking to the right. After 3 more steps, turn your head to the left and keep walking straight while looking left. Continue alternating looking right  and left. (2) Mild impairment - Performs head turns smoothly with slight change in gait velocity (eg, minor disruption to smooth gait path), deviates 15.24 -25.4 cm (6 -10 in) outside 30.48-cm (12-in) walkway width, or uses an assistive device.  4.   GAIT WITH VERTICAL HEAD TURNS Instructions: Walk from here to the next mark (6 m [20 ft]). Begin walking at your normal pace. Keep walking straight; after 3 steps, tip your head up and keep walking straight while looking up. After 3 more steps, tip your head down, keep walking straight while looking down. Continue  alternating looking up and down every 3 steps until you have completed 2 repetitions in each direction. (2) Mild impairment - Performs task with slight change in gait velocity (eg, minor disruption to smooth gait path), deviates 15.24 -25.4 cm (6 -10 in) outside 30.48-cm (12-in) walkway width or uses assistive device.  5.  GAIT AND PIVOT TURN Instructions: Begin with walking at your normal pace. When I tell you, "turn and stop," turn as quickly as you can to face the opposite direction and stop. (2) Mild impairment - Pivot turns safely in 3 seconds and stops with no loss of balance, or pivot turns safely within 3 seconds and stops with mild imbalance, requires small steps to catch balance  6.   STEP OVER OBSTACLE Instructions: Begin walking at your normal speed. When you come to the shoe box, step over it, not around it, and keep walking. (2) Mild impairment - Is able to step over one shoe box (11.43 cm [4.5 in] total height) without changing gait speed; no evidence of imbalance.  7.   GAIT WITH NARROW BASE OF SUPPORT Instructions: Walk on the floor with arms folded across the chest, feet aligned heel to toe in tandem for a distance of 3.6 m [12 ft]. The number of steps taken in a straight line are counted for a maximum of 10 steps. (1) Moderate impairment - Ambulates 4 -7 steps.  8.   GAIT WITH EYES CLOSED Instructions: Walk at your normal speed  from here to the next mark (6 m [20 ft]) with your eyes closed. (2) Mild impairment - Walks 6 m (20 ft), uses assistive device, slower speed, mild gait deviations, deviates 15.24 -25.4 cm (6 -10 in) outside 30.48-cm (12-in) walkway width. Ambulates 6 m (20 ft) in less than 9 seconds but greater than 7 seconds  9.   AMBULATING BACKWARDS Instructions: Walk backwards until I tell you to stop (2) Mild impairment - Walks 6 m (20 ft), uses assistive device, slower speed, mild gait deviations, deviates 15.24 -25.4 cm (6 -10 in) outside 30.48-cm (12-in) walkway width  10. STEPS Instructions: Walk up these stairs as you would at home (ie, using the rail if necessary). At the top turn around and walk down. (2) Mild impairment-Alternating feet, must use rail.  Total 18/30                                                                                                                                TREATMENT DATE: 02/16/2024 PT Evaluation  PATIENT EDUCATION: Education details: Patient educated on safe ambulation and stair negotiation with R foot drop, pt educated on using hip flexor strength and toes to ceiling to prevent toe drag and tripping. Patient educated on need for getting clearance for driving from physician and that PT does not clear this. Pt will contact physician for getting clearance and verbally agrees to take transportation until they are cleared to drive.  Person educated: Patient Education method: Explanation, Demonstration, and Verbal cues Education comprehension: verbalized understanding and needs further education  HOME EXERCISE PROGRAM: To be established on next visit  GOALS: Goals reviewed with patient? Yes  SHORT TERM GOALS: STG = LTG  LONG TERM GOALS: Target date: 03/17/2024  Pt will be independent with final HEP in order to build upon functional gains made in therapy. Baseline: will establish  Goal status: INITIAL  Pt will improve FGA to at least a 22/30 in order to demo  decr fall risk. Baseline: 18/30 (02/16/24) Goal status: INITIAL  Pt will improve gait speed with no AD to at least 3.0 ft/sec in order to demo improved community mobility.  Baseline: No AD = 2.4 ft/sec, 13.66 seconds(02/16/24)  Goal status: INITIAL  Pt will improve TUG time with no AD to less than or equal to 10 seconds in order to demo decreased fall risk and improved community ambulation. Baseline: No AD = 13.13 seconds (02/16/24)  Goal status: INITIAL  Pt will improve 5xSTS time to less than or equal  to 15 seconds in order to demo improved community mobility and increased LE strength. Baseline: 18.06 seconds (02/16/24)  Goal status: INITIAL    ASSESSMENT:  CLINICAL IMPRESSION: Patient is a 73 year old male referred to Neuro OPPT for post-stroke rehabilitation.   Pt's PMH is significant for: Left frontal subcortical infarction (09/2020), left basal ganglia/internal capsule infarcts (03/2016) with no significant residual deficit, HTN, HLD, T2DM, former tobacco user, COPD, OSA/central sleep apnea not adherent to CPAP The following deficits were present during the exam: R drop foot, bilateral LE weakness globally, especially of bilateral hip flexors, abductors and dorsiflexors. Patient presented with toe drag of right foot, tripping multiple times during session. With assessment and above listed impairments, patient has increased risk of falls as shown with increased time to complete TUG, 5 time sit to stand and FGA score. Patient was educated on safe ambulation and stair negotiation with R foot drop and was cued to improve R DF activation. Patient educated on need for getting clearance for driving from physician. Pt will contact physician for getting clearance and verbally agrees to take transportation until they are cleared to drive. Pt would benefit from skilled PT to address these impairments and functional limitations to maximize functional mobility independence.   OBJECTIVE IMPAIRMENTS:  Abnormal gait, decreased balance, decreased cognition, decreased coordination, decreased endurance, decreased knowledge of condition, decreased knowledge of use of DME, difficulty walking, decreased strength, and decreased safety awareness.   ACTIVITY LIMITATIONS: carrying, lifting, bending, standing, squatting, and stairs  PARTICIPATION LIMITATIONS: meal prep, cleaning, laundry, driving, shopping, and community activity  PERSONAL FACTORS: 3+ comorbidities: left frontal subcortical infarction (09/2020), left basal ganglia/internal capsule infarcts (03/2016) with no significant residual deficit, HTN, HLD, T2DM, former tobacco user, COPD, OSA/central sleep apnea not adherent to CPAP. are also affecting patient's functional outcome.   REHAB POTENTIAL: Good  CLINICAL DECISION MAKING: Evolving/moderate complexity  EVALUATION COMPLEXITY: Moderate  PLAN:  PT FREQUENCY: 1-2x/week  PT DURATION: 4 weeks  PLANNED INTERVENTIONS: 97164- PT Re-evaluation, 97110-Therapeutic exercises, 97530- Therapeutic activity, [id number]- Neuromuscular re-education, 97535- Self Care, [id number]- Manual therapy, [id number]- Gait training, [phone]- Orthotic Initial, [phone]- Orthotic/Prosthetic subsequent, [phone]- Canalith repositioning, [id number]- Electrical stimulation (unattended), [phone]- Electrical stimulation (manual), Patient/Family education, Balance training, Stair training, Vestibular training, Visual/preceptual remediation/compensation, Cognitive remediation, and DME instructions  PLAN FOR NEXT SESSION: LE strengthening, cue to DF activation, toes to ceiling - patient responded well to this and stopped tripping over R foot   Emmalene Sherry, Student-PT  02/19/2024, 4:52 PM

## 2024-02-26 ENCOUNTER — Other Ambulatory Visit (HOSPITAL_COMMUNITY): Payer: Self-pay

## 2024-02-28 ENCOUNTER — Other Ambulatory Visit: Payer: Self-pay | Admitting: Family Medicine

## 2024-02-28 DIAGNOSIS — I10 Essential (primary) hypertension: Secondary | ICD-10-CM

## 2024-02-29 ENCOUNTER — Telehealth: Payer: Self-pay | Admitting: Pharmacist

## 2024-02-29 NOTE — Telephone Encounter (Signed)
 Attempted to contact patient for follow-up of adherence to Atorvastatin  80 mg and to see if refill is needed.  Left HIPAA compliant voice mail requesting call back to direct phone: [phone]  Total time with patient call and documentation of interaction: 3 minutes.

## 2024-03-01 ENCOUNTER — Ambulatory Visit: Attending: Neurology | Admitting: Physical Therapy

## 2024-03-01 NOTE — Telephone Encounter (Signed)
Please let patient know I am refilling this medication, but he needs to schedule an appointment with me.   Thanks, Jakorian Marengo J Jodiann Ognibene, MD  

## 2024-03-08 ENCOUNTER — Ambulatory Visit: Admitting: Physical Therapy

## 2024-03-08 ENCOUNTER — Other Ambulatory Visit: Payer: Self-pay | Admitting: Family Medicine

## 2024-03-08 ENCOUNTER — Telehealth: Payer: Self-pay | Admitting: Physical Therapy

## 2024-03-08 DIAGNOSIS — E1169 Type 2 diabetes mellitus with other specified complication: Secondary | ICD-10-CM

## 2024-03-08 NOTE — Telephone Encounter (Signed)
 Called pt (but had to leave message on spouse's vm) due to 2nd no show. Educated on no show policy and that since this is pt's 2nd no show appt, then will need to get a new referral to return to therapy and all remaining visits will need to be canceled.   Sheffield Senate, PT, DPT 03/08/24 9:55 AM   [facility] [address] [place], [place]  [address] Phone:  [phone] Fax:  [phone]

## 2024-03-15 ENCOUNTER — Encounter: Payer: Self-pay | Admitting: Pharmacist

## 2024-03-15 ENCOUNTER — Ambulatory Visit: Admitting: Physical Therapy

## 2024-03-15 NOTE — Progress Notes (Signed)
 This patient is appearing on a report for being at risk of failing the adherence measure for cholesterol (statin) medications this calendar year.   Medication: atorvastatin  80 mg Last fill date: 03/08/24 for 90 day supply  Completed chart review

## 2024-03-29 ENCOUNTER — Ambulatory Visit: Admitting: Physical Therapy

## 2024-04-01 ENCOUNTER — Ambulatory Visit

## 2024-04-01 VITALS — Ht 66.0 in | Wt 170.0 lb

## 2024-04-01 DIAGNOSIS — Z0000 Encounter for general adult medical examination without abnormal findings: Secondary | ICD-10-CM

## 2024-04-01 NOTE — Patient Instructions (Signed)
 Jonathan [name],  Thank you for taking the time for your Medicare Wellness Visit. I appreciate your continued commitment to your health goals. Please review the care plan we discussed, and feel free to reach out if I can assist you further.  Please note that Annual Wellness Visits do not include a physical exam. Some assessments may be limited, especially if the visit was conducted virtually. If needed, we may recommend an in-person follow-up with your provider.  Ongoing Care Seeing your primary care provider every 3 to 6 months helps us  monitor your health and provide consistent, personalized care.   Referrals If a referral was made during today's visit and you haven't received any updates within two weeks, please contact the referred provider directly to check on the status.  Recommended Screenings:  Health Maintenance  Topic Date Due   Zoster (Shingles) Vaccine (1 of 2) 05/13/1970   Colon Cancer Screening  Never done   DTaP/Tdap/Td vaccine (2 - Td or Tdap) 11/02/2020   Eye exam for diabetics  12/11/2022   Medicare Annual Wellness Visit  11/21/2023   COVID-19 Vaccine (7 - 2025-26 season) 12/25/2023   Screening for Lung Cancer  05/29/2024   Hemoglobin A1C  06/24/2024   Complete foot exam   07/30/2024   Yearly kidney function blood test for diabetes  11/20/2024   Yearly kidney health urinalysis for diabetes  12/11/2024   Pneumococcal Vaccine for age over 16  Completed   Flu Shot  Completed   Hepatitis C Screening  Completed   Meningitis B Vaccine  Aged Out       04/01/2024    4:28 PM  Advanced Directives  Does Patient Have a Medical Advance Directive? No  Would patient like information on creating a medical advance directive? No - Patient declined    Vision: Annual vision screenings are recommended for early detection of glaucoma, cataracts, and diabetic retinopathy. These exams can also reveal signs of chronic conditions such as diabetes and high blood pressure.  Dental: Annual  dental screenings help detect early signs of oral cancer, gum disease, and other conditions linked to overall health, including heart disease and diabetes.  Please see the attached documents for additional preventive care recommendations.

## 2024-04-01 NOTE — Progress Notes (Signed)
 I connected with  Jonathan [name] on 04/01/24 by a audio enabled telemedicine application and verified that I am speaking with the correct person using two identifiers.  Patient Location: Home  Provider Location: Home Office  Persons Participating in Visit: Patient.  I discussed the limitations of evaluation and management by telemedicine. The patient expressed understanding and agreed to proceed.   Vital Signs: Because this visit was a virtual/telehealth visit, some criteria may be missing or patient reported. Any vitals not documented were not able to be obtained and vitals that have been documented are patient reported.     Chief Complaint  Patient presents with   Medicare Wellness    SUBSEQUENT     Subjective:   Jonathan [name] is a 73 y.o. male who presents for a Medicare Annual Wellness Visit.  Visit info / Clinical Intake: Medicare Wellness Visit Type:: Subsequent Annual Wellness Visit Persons participating in visit and providing information:: patient Medicare Wellness Visit Mode:: Telephone If telephone:: video declined Since this visit was completed virtually, some vitals may be partially provided or unavailable. Missing vitals are due to the limitations of the virtual format.: Documented vitals are patient reported If Telephone or Video please confirm:: I connected with patient using audio/video enable telemedicine. I verified patient identity with two identifiers, discussed telehealth limitations, and patient agreed to proceed. Patient Location:: HOME Provider Location:: HOME OFFICE Interpreter Needed?: No Pre-visit prep was completed: yes AWV questionnaire completed by patient prior to visit?: no Living arrangements:: lives with spouse/significant other Patient's Overall Health Status Rating: (!) fair Typical amount of pain: none Does pain affect daily life?: no Are you currently prescribed opioids?: no  Dietary Habits and Nutritional Risks How many meals a  day?: 2 (SNACKS) Eats fruit and vegetables daily?: yes Most meals are obtained by: preparing own meals In the last 2 weeks, have you had any of the following?: none Diabetic:: (!) yes Any non-healing wounds?: no How often do you check your BS?: 2 Would you like to be referred to a Nutritionist or for Diabetic Management? : no  Functional Status Activities of Daily Living (to include ambulation/medication): Independent Ambulation: Independent Medication Administration: Independent Home Management (perform basic housework or laundry): Independent Manage your own finances?: yes Primary transportation is: driving Concerns about vision?: no *vision screening is required for WTM* Concerns about hearing?: no  Fall Screening Falls in the past year?: 0 Number of falls in past year: 0 Was there an injury with Fall?: 0 Fall Risk Category Calculator: 0 Patient Fall Risk Level: Low Fall Risk  Fall Risk Patient at Risk for Falls Due to: No Fall Risks Fall risk Follow up: Falls evaluation completed; Education provided  Home and Transportation Safety: All rugs have non-skid backing?: N/A, no rugs All stairs or steps have railings?: N/A, no stairs Grab bars in the bathtub or shower?: (!) no Have non-skid surface in bathtub or shower?: yes Good home lighting?: yes Regular seat belt use?: yes Hospital stays in the last year:: (!) yes How many hospital stays:: 1 Reason: STROKE  Cognitive Assessment Difficulty concentrating, remembering, or making decisions? : no Will 6CIT or Mini Cog be Completed: yes What year is it?: 0 points What month is it?: 0 points Give patient an address phrase to remember (5 components): [address], [place], [place] About what time is it?: 0 points Count backwards from 20 to 1: 0 points Say the months of the year in reverse: 0 points Repeat the address phrase from earlier:  0 points 6 CIT Score: 0 points  Advance Directives (For Healthcare) Does  Patient Have a Medical Advance Directive?: No Type of Advance Directive: Healthcare Power of [place]; Living will Would patient like information on creating a medical advance directive?: No - Patient declined  Reviewed/Updated  Reviewed/Updated: Reviewed All (Medical, Surgical, Family, Medications, Allergies, Care Teams, Patient Goals)    Allergies (verified) Patient has no known allergies.   Current Medications (verified) Outpatient Encounter Medications as of 04/01/2024  Medication Sig   Accu-Chek Softclix Lancets lancets Use to check blood sugar 3x per day. E11.9   albuterol  (VENTOLIN  HFA) 108 (90 Base) MCG/ACT inhaler Inhale 2 puffs into the lungs every 6 (six) hours as needed.   aspirin  EC 81 MG tablet Take 1 tablet (81 mg total) by mouth daily. Swallow whole.   atorvastatin  (LIPITOR ) 80 MG tablet Take 1 tablet by mouth once daily   Blood Glucose Monitoring Suppl (ACCU-CHEK GUIDE) w/Device KIT Use to check blood sugar 3x per day. E11.9   Blood Pressure Monitoring (3 SERIES BP MONITOR/UPPER ARM) DEVI Check blood pressure once a day   buPROPion  (WELLBUTRIN  XL) 300 MG 24 hr tablet Take 1 tablet (300 mg total) by mouth every morning.   cetirizine  (ZYRTEC ) 10 MG tablet Take 1 tablet (10 mg total) by mouth daily.   cilostazol  (PLETAL ) 100 MG tablet Take 1 tablet by mouth twice daily   cyanocobalamin  (VITAMIN B12) 1000 MCG tablet Take 1 tablet (1,000 mcg total) by mouth daily.   fluticasone  (FLONASE ) 50 MCG/ACT nasal spray Place 1 spray into both nostrils daily.   glucose blood (ACCU-CHEK GUIDE TEST) test strip Use to check blood sugar 3x per day. E11.9   insulin  glargine (LANTUS ) 100 unit/mL SOPN Inject 19 Units into the skin daily.   ipratropium (ATROVENT ) 0.03 % nasal spray Place 2 sprays into both nostrils every 12 (twelve) hours.   Lancet Devices (ONE TOUCH DELICA LANCING DEV) MISC Use to check blood sugar 3 times daily   lisinopril  (ZESTRIL ) 40 MG tablet Take 1 tablet by mouth once  daily   Vitamin D , Cholecalciferol , 25 MCG (1000 UT) TABS Take 1 capsule by mouth daily.   Zoster Vaccine Adjuvanted (SHINGRIX ) injection Administer Shingrix  vaccination now and repeat in two months   No facility-administered encounter medications on file as of 04/01/2024.    History: Past Medical History:  Diagnosis Date   Anxiety    Arthritis    COPD (chronic obstructive pulmonary disease) (HCC)    Depression    Diabetes mellitus without complication (HCC)    Diarrhea    History of kidney stones    HTN (hypertension)    Hyperlipidemia    Hypertensive retinopathy    OU   Pneumonia    Retinal detachment    Rheg. RD OD   Routine adult health maintenance 08/30/2023   Sleep apnea    does not use cpap   Stroke [facility])    denies any deficits   Past Surgical History:  Procedure Laterality Date   CATARACT EXTRACTION Bilateral    Dr. Roz [place]     EYE SURGERY Bilateral    Cat Sx OU; RD repair OD   GAS INSERTION Right 03/13/2019   Procedure: INSERTION OF GAS (C3F8) RIGHT EYE;  Surgeon: Valdemar Rogue, MD;  Location: [facility] OR;  Service: Ophthalmology;  Laterality: Right;   LAPAROSCOPIC APPENDECTOMY N/A 10/03/2020   Procedure: APPENDECTOMY LAPAROSCOPIC;  Surgeon: Rubin Calamity, MD;  Location[facility] OR;  Service: General;  Laterality:  N/A;   LASER PHOTO ABLATION Right 03/13/2019   Procedure: LASER PHOTO  ABLATION RIGHT EYE;  Surgeon: Valdemar Rogue, MD;  Location: [facility] OR;  Service: Ophthalmology;  Laterality: Right;   none     PARS PLANA VITRECTOMY Right 03/13/2019   Procedure: 25 GAUGE PARS PLANA VITRECTOMY WITH  INTRAOCULAR LENSE EXPLANTATION RIGHT EYE.;  Surgeon: Valdemar Rogue, MD;  Location: [facility] OR;  Service: Ophthalmology;  Laterality: Right;   PARS PLANA VITRECTOMY Right 06/27/2019   Procedure: PARS PLANA VITRECTOMY WITH 25 GAUGE;  Surgeon: Valdemar Rogue, MD;  Location: [facility] OR;  Service: Ophthalmology;  Laterality: Right;   PHOTOCOAGULATION WITH LASER Left 03/13/2019    Procedure: INDIRECT PHOTOCOAGULATION WITH LASER LEFT EYE;  Surgeon: Valdemar Rogue, MD;  Location: [facility] OR;  Service: Ophthalmology;  Laterality: Left;   PLACEMENT AND SUTURE OF SECONDARY INTRAOCULAR LENS Right 06/27/2019   Procedure: PLACEMENT AND SUTURE OF SECONDARY INTRAOCULAR LENS;  Surgeon: Valdemar Rogue, MD;  Location: [facility] OR;  Service: Ophthalmology;  Laterality: Right;   RETINAL DETACHMENT SURGERY Right 03/13/2019   PPV for repair of rheg. RD - Dr. Rogue Valdemar   Family History  Problem Relation Age of Onset   Cancer Mother        brain tumor   Heart disease Father    Cirrhosis Father    Social History   Occupational History   Occupation: welder  Tobacco Use   Smoking status: Former    Current packs/day: 0.00    Average packs/day: 1 pack/day for 50.0 years (50.0 ttl pk-yrs)    Types: Cigarettes    Quit date: 08/2021    Years since quitting: 2.6    Passive exposure: Past   Smokeless tobacco: Never   Tobacco comments:       Vaping Use   Vaping status: Never Used  Substance and Sexual Activity   Alcohol use: No   Drug use: No   Sexual activity: Not on file   Tobacco Counseling Counseling given: Not Answered Tobacco comments:    SDOH Screenings   Food Insecurity: No Food Insecurity (04/01/2024)  Housing: Low Risk  (04/01/2024)  Transportation Needs: No Transportation Needs (04/01/2024)  Utilities: Not At Risk (04/01/2024)  Alcohol Screen: Low Risk  (11/21/2022)  Depression (PHQ2-9): Low Risk  (04/01/2024)  Financial Resource Strain: Medium Risk (10/30/2023)  Physical Activity: Inactive (04/01/2024)  Social Connections: Moderately Integrated (04/01/2024)  Stress: No Stress Concern Present (04/01/2024)  Tobacco Use: Medium Risk (04/01/2024)  Health Literacy: Adequate Health Literacy (04/01/2024)   See flowsheets for full screening details  Depression Screen PHQ 2 & 9 Depression Scale- Over the past 2 weeks, how often have you been bothered by any of the following  problems? Little interest or pleasure in doing things: 0 Feeling down, depressed, or hopeless (PHQ Adolescent also includes...irritable): 0 PHQ-2 Total Score: 0 Trouble falling or staying asleep, or sleeping too much: 0 Feeling tired or having little energy: 0 Poor appetite or overeating (PHQ Adolescent also includes...weight loss): 0 Feeling bad about yourself - or that you are a failure or have let yourself or your family down: 0 Trouble concentrating on things, such as reading the newspaper or watching television (PHQ Adolescent also includes...like school work): 0 Moving or speaking so slowly that other people could have noticed. Or the opposite - being so fidgety or restless that you have been moving around a lot more than usual: 0 Thoughts that you would be better off dead, or of hurting yourself in some way: 0 PHQ-9  Total Score: 0 If you checked off any problems, how difficult have these problems made it for you to do your work, take care of things at home, or get along with other people?: Not difficult at all  Depression Treatment Depression Interventions/Treatment : EYV7-0 Score <4 Follow-up Not Indicated     Goals Addressed             This Visit's Progress    04/01/2024: To maintain my health.       Quit Smoking               Objective:    Today's Vitals   04/01/24 1626  Weight: 170 lb (77.1 kg)  Height: 5' 6 (1.676 m)  PainSc: 0-No pain   Body mass index is 27.44 kg/m.  Hearing/Vision screen Hearing Screening - Comments:: Adequate hearing. Vision Screening - Comments:: Adequate vision. Immunizations and Health Maintenance Health Maintenance  Topic Date Due   Zoster Vaccines- Shingrix  (1 of 2) 05/13/1969   Colonoscopy  Never done   DTaP/Tdap/Td (2 - Td or Tdap) 11/02/2020   OPHTHALMOLOGY EXAM  12/11/2022   COVID-19 Vaccine (7 - 2025-26 season) 12/25/2023   Lung Cancer Screening  05/29/2024   HEMOGLOBIN A1C  06/24/2024   FOOT EXAM  07/30/2024    Diabetic kidney evaluation - eGFR measurement  11/20/2024   Diabetic kidney evaluation - Urine ACR  12/11/2024   Medicare Annual Wellness (AWV)  04/01/2025   Pneumococcal Vaccine: 50+ Years  Completed   Influenza Vaccine  Completed   Hepatitis C Screening  Completed   Meningococcal B Vaccine  Aged Out        Assessment/Plan:  This is a routine wellness examination for Jonathan [name].  Patient Care Team: Donah Laymon PARAS, MD as PCP - General (Family Medicine) [place], My [place], [place] as Referring Physician (Optometry) Whitfield Raisin, NP as Nurse Practitioner (Neurology) Janit Thresa HERO, DPM as Consulting Physician (Podiatry) Evern Tita Kingsley, MD as Referring Physician (Urology)  I have personally reviewed and noted the following in the patient's chart:   Medical and social history Use of alcohol, tobacco or illicit drugs  Current medications and supplements including opioid prescriptions. Functional ability and status Nutritional status Physical activity Advanced directives List of other physicians Hospitalizations, surgeries, and ER visits in previous 12 months Vitals Screenings to include cognitive, depression, and falls Referrals and appointments  No orders of the defined types were placed in this encounter.  In addition, I have reviewed and discussed with patient certain preventive protocols, quality metrics, and best practice recommendations. A written personalized care plan for preventive services as well as general preventive health recommendations were provided to patient.   Jonathan LOISE Fuller, LPN   [id number]   Return in about 1 year (around 04/01/2025) for Medicare wellness.  After Visit Summary: (MyChart) Due to this being a telephonic visit, the after visit summary with patients personalized plan was offered to patient via MyChart   Nurse Notes: NCIR was verified, no new vaccines. Care team updated. Patient is due for a colonoscopy, vaccines and nurse requested  notes from optometrist via fax.

## 2024-05-09 ENCOUNTER — Encounter: Payer: Self-pay | Admitting: Pharmacist

## 2024-05-09 NOTE — Progress Notes (Signed)
 This patient is appearing on a report for being at risk of failing the adherence measure for cholesterol (statin) medications this calendar year.   Medication: atorvastatin  80 mg Last fill date: 03/18/24 for 90 day supply  Reviewed medication indication, dosing, and goals of therapy.

## 2024-05-13 ENCOUNTER — Ambulatory Visit: Admitting: Family Medicine
